# Patient Record
Sex: Female | Born: 1980 | Race: Black or African American | Hispanic: No | Smoking: Never smoker
Health system: Southern US, Community
[De-identification: ages and names within clinical notes are randomized; demographics above are authoritative.]

## PROBLEM LIST (undated history)

## (undated) ENCOUNTER — Inpatient Hospital Stay (HOSPITAL_COMMUNITY): Payer: Self-pay

## (undated) DIAGNOSIS — J40 Bronchitis, not specified as acute or chronic: Secondary | ICD-10-CM

## (undated) DIAGNOSIS — M1990 Unspecified osteoarthritis, unspecified site: Secondary | ICD-10-CM

## (undated) DIAGNOSIS — N83201 Unspecified ovarian cyst, right side: Secondary | ICD-10-CM

## (undated) DIAGNOSIS — E119 Type 2 diabetes mellitus without complications: Secondary | ICD-10-CM

## (undated) DIAGNOSIS — R87619 Unspecified abnormal cytological findings in specimens from cervix uteri: Secondary | ICD-10-CM

## (undated) DIAGNOSIS — R079 Chest pain, unspecified: Secondary | ICD-10-CM

## (undated) DIAGNOSIS — F319 Bipolar disorder, unspecified: Secondary | ICD-10-CM

## (undated) DIAGNOSIS — I10 Essential (primary) hypertension: Secondary | ICD-10-CM

## (undated) DIAGNOSIS — G5600 Carpal tunnel syndrome, unspecified upper limb: Secondary | ICD-10-CM

## (undated) DIAGNOSIS — R87629 Unspecified abnormal cytological findings in specimens from vagina: Secondary | ICD-10-CM

## (undated) DIAGNOSIS — J45909 Unspecified asthma, uncomplicated: Secondary | ICD-10-CM

## (undated) DIAGNOSIS — E7800 Pure hypercholesterolemia, unspecified: Secondary | ICD-10-CM

## (undated) DIAGNOSIS — F4310 Post-traumatic stress disorder, unspecified: Secondary | ICD-10-CM

## (undated) DIAGNOSIS — IMO0002 Reserved for concepts with insufficient information to code with codable children: Secondary | ICD-10-CM

## (undated) DIAGNOSIS — M779 Enthesopathy, unspecified: Secondary | ICD-10-CM

## (undated) DIAGNOSIS — M542 Cervicalgia: Secondary | ICD-10-CM

## (undated) DIAGNOSIS — E785 Hyperlipidemia, unspecified: Secondary | ICD-10-CM

## (undated) HISTORY — DX: Pure hypercholesterolemia, unspecified: E78.00

## (undated) HISTORY — DX: Cervicalgia: M54.2

## (undated) HISTORY — DX: Type 2 diabetes mellitus without complications: E11.9

## (undated) HISTORY — DX: Post-traumatic stress disorder, unspecified: F43.10

## (undated) HISTORY — DX: Unspecified abnormal cytological findings in specimens from vagina: R87.629

## (undated) HISTORY — PX: HERNIA REPAIR: SHX51

## (undated) HISTORY — DX: Enthesopathy, unspecified: M77.9

## (undated) HISTORY — DX: Bipolar disorder, unspecified: F31.9

## (undated) HISTORY — DX: Unspecified asthma, uncomplicated: J45.909

## (undated) HISTORY — DX: Hyperlipidemia, unspecified: E78.5

## (undated) HISTORY — DX: Chest pain, unspecified: R07.9

## (undated) HISTORY — DX: Unspecified osteoarthritis, unspecified site: M19.90

## (undated) HISTORY — DX: Reserved for concepts with insufficient information to code with codable children: IMO0002

## (undated) HISTORY — DX: Unspecified abnormal cytological findings in specimens from cervix uteri: R87.619

## (undated) HISTORY — DX: Unspecified ovarian cyst, right side: N83.201

## (undated) HISTORY — DX: Essential (primary) hypertension: I10

## (undated) HISTORY — DX: Carpal tunnel syndrome, unspecified upper limb: G56.00

---

## 2005-12-16 ENCOUNTER — Ambulatory Visit (HOSPITAL_COMMUNITY): Admission: RE | Admit: 2005-12-16 | Discharge: 2005-12-16 | Payer: Self-pay | Admitting: Obstetrics and Gynecology

## 2005-12-28 ENCOUNTER — Inpatient Hospital Stay (HOSPITAL_COMMUNITY): Admission: EM | Admit: 2005-12-28 | Discharge: 2005-12-30 | Payer: Self-pay | Admitting: Obstetrics and Gynecology

## 2007-09-15 ENCOUNTER — Other Ambulatory Visit: Admission: RE | Admit: 2007-09-15 | Discharge: 2007-09-15 | Payer: Self-pay | Admitting: Obstetrics & Gynecology

## 2009-04-23 ENCOUNTER — Emergency Department (HOSPITAL_COMMUNITY): Admission: EM | Admit: 2009-04-23 | Discharge: 2009-04-23 | Payer: Self-pay | Admitting: Emergency Medicine

## 2009-05-13 ENCOUNTER — Emergency Department (HOSPITAL_COMMUNITY): Admission: EM | Admit: 2009-05-13 | Discharge: 2009-05-13 | Payer: Self-pay | Admitting: Emergency Medicine

## 2009-06-19 ENCOUNTER — Emergency Department (HOSPITAL_COMMUNITY): Admission: EM | Admit: 2009-06-19 | Discharge: 2009-06-19 | Payer: Self-pay | Admitting: Emergency Medicine

## 2009-07-12 ENCOUNTER — Other Ambulatory Visit: Admission: RE | Admit: 2009-07-12 | Discharge: 2009-07-12 | Payer: Self-pay | Admitting: Obstetrics & Gynecology

## 2009-07-26 ENCOUNTER — Emergency Department (HOSPITAL_COMMUNITY): Admission: EM | Admit: 2009-07-26 | Discharge: 2009-07-26 | Payer: Self-pay | Admitting: Emergency Medicine

## 2009-10-31 ENCOUNTER — Emergency Department (HOSPITAL_COMMUNITY): Admission: EM | Admit: 2009-10-31 | Discharge: 2009-10-31 | Payer: Self-pay | Admitting: Emergency Medicine

## 2010-01-15 ENCOUNTER — Emergency Department (HOSPITAL_COMMUNITY): Admission: EM | Admit: 2010-01-15 | Discharge: 2010-01-15 | Payer: Self-pay | Admitting: Emergency Medicine

## 2010-04-17 ENCOUNTER — Emergency Department (HOSPITAL_COMMUNITY): Payer: Medicaid Other

## 2010-04-17 ENCOUNTER — Emergency Department (HOSPITAL_COMMUNITY)
Admission: EM | Admit: 2010-04-17 | Discharge: 2010-04-17 | Disposition: A | Discharge status: Home / Self Care | Payer: Medicaid Other | Attending: Emergency Medicine | Admitting: Emergency Medicine

## 2010-04-17 DIAGNOSIS — G5600 Carpal tunnel syndrome, unspecified upper limb: Secondary | ICD-10-CM | POA: Insufficient documentation

## 2010-04-17 DIAGNOSIS — M79609 Pain in unspecified limb: Secondary | ICD-10-CM | POA: Insufficient documentation

## 2010-05-02 LAB — URINALYSIS, ROUTINE W REFLEX MICROSCOPIC
Bilirubin Urine: NEGATIVE
Glucose, UA: NEGATIVE mg/dL
Ketones, ur: NEGATIVE mg/dL
Leukocytes, UA: NEGATIVE
Nitrite: NEGATIVE
Protein, ur: NEGATIVE mg/dL
Urobilinogen, UA: 0.2 mg/dL (ref 0.0–1.0)
pH: 6 (ref 5.0–8.0)

## 2010-05-02 LAB — POCT I-STAT, CHEM 8
BUN: 9 mg/dL (ref 6–23)
Calcium, Ion: 1.15 mmol/L (ref 1.12–1.32)
Creatinine, Ser: 0.7 mg/dL (ref 0.4–1.2)
HCT: 40 % (ref 36.0–46.0)

## 2010-05-02 LAB — URINE CULTURE: Culture: NO GROWTH

## 2010-05-02 LAB — WET PREP, GENITAL: Yeast Wet Prep HPF POC: NONE SEEN

## 2010-05-02 LAB — URINE MICROSCOPIC-ADD ON

## 2010-05-02 LAB — GC/CHLAMYDIA PROBE AMP, GENITAL
Chlamydia, DNA Probe: NEGATIVE
GC Probe Amp, Genital: NEGATIVE

## 2010-05-08 LAB — DIFFERENTIAL
Basophils Absolute: 0 10*3/uL (ref 0.0–0.1)
Basophils Relative: 1 % (ref 0–1)
Eosinophils Absolute: 0.2 10*3/uL (ref 0.0–0.7)
Eosinophils Relative: 2 % (ref 0–5)
Lymphs Abs: 3.7 10*3/uL (ref 0.7–4.0)
Monocytes Absolute: 0.6 10*3/uL (ref 0.1–1.0)
Neutro Abs: 4.3 10*3/uL (ref 1.7–7.7)
Neutrophils Relative %: 49 % (ref 43–77)

## 2010-05-08 LAB — CBC
MCHC: 33.8 g/dL (ref 30.0–36.0)
MCV: 94.5 fL (ref 78.0–100.0)
Platelets: 256 10*3/uL (ref 150–400)
RDW: 12.6 % (ref 11.5–15.5)
WBC: 8.8 10*3/uL (ref 4.0–10.5)

## 2010-05-08 LAB — BASIC METABOLIC PANEL
CO2: 28 mEq/L (ref 19–32)
Chloride: 108 mEq/L (ref 96–112)
GFR calc Af Amer: >60 mL/min (ref >60)
GFR calc non Af Amer: >60 mL/min (ref >60)
Glucose, Bld: 126 mg/dL — ABNORMAL HIGH (ref 70–99)

## 2010-05-14 LAB — POCT I-STAT, CHEM 8
HCT: 37 % (ref 36.0–46.0)
Hemoglobin: 12.6 g/dL (ref 12.0–15.0)
TCO2: 28 mmol/L (ref 0–100)

## 2010-05-18 ENCOUNTER — Other Ambulatory Visit (HOSPITAL_COMMUNITY)
Admission: RE | Admit: 2010-05-18 | Discharge: 2010-05-18 | Disposition: A | Discharge status: Home / Self Care | Payer: Medicaid Other | Source: Ambulatory Visit | Attending: Obstetrics & Gynecology | Admitting: Obstetrics & Gynecology

## 2010-05-18 ENCOUNTER — Other Ambulatory Visit: Payer: Self-pay | Admitting: Obstetrics & Gynecology

## 2010-05-18 DIAGNOSIS — Z01419 Encounter for gynecological examination (general) (routine) without abnormal findings: Secondary | ICD-10-CM | POA: Insufficient documentation
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
## 2010-07-07 NOTE — Op Note (Signed)
NAME:  Breanna Malone, Breanna Malone NO.:  [id number]   MEDICAL RECORD NO.:  [id number]          PATIENT TYPE:  INP   LOCATION:  A402                          FACILITY:  [facility]   PHYSICIAN:  Lazaro Arms, M.D.   DATE OF BIRTH:  [birth year]   DATE OF PROCEDURE:  12/29/2005  DATE OF DISCHARGE:  12/30/2005                               OPERATIVE REPORT   Breanna Malone is a 30 year old gravida 6, para 4, abortus 1, who came in in  active labor.  She progressed well through labor.  She had an epidural  placed.  Over an intact perineum, she delivered a viable female infant on  December 29, 2005, at 12:24, with Apgars of 9 and 9 weighing 7 pounds 13  ounces.  There was a three vessel cord.  Cord blood and cord gas were  sent.  There were no vaginal lacerations or episiotomy.  The placenta  was delivered intact.  The uterus was firm below the umbilicus.  Estimated blood loss for the delivery was 650 mL.  The infant underwent  routine neonatal resuscitation.      Lazaro Arms, M.D.  Electronically Signed     LHE/MEDQ  D:  01/23/2006  T:  01/23/2006  Job:  [id number]

## 2010-07-07 NOTE — Op Note (Signed)
NAME:  Breanna Malone, Breanna Malone NO.:  [id number]   MEDICAL RECORD NO.:  [id number]          PATIENT TYPE:  INP   LOCATION:  A402                          FACILITY:  [facility]   PHYSICIAN:  Lazaro Arms, M.D.   DATE OF BIRTH:  1981/01/09   DATE OF PROCEDURE:  12/28/2005  DATE OF DISCHARGE:  12/30/2005                               OPERATIVE REPORT   [name] is a 29 year old gravida 6, para 4, abortus 1, in active labor  requesting epidural.  She was placed in a sitting position.  Betadine  prep was used.  1% lidocaine is injected into the L3-L4 interspace area.  A 17 gauge Tuohey needle was used.  Loss of resistance is employed and  the epidural space is found on one pass without difficulty.  10 mL of  0.125% bupivacaine was given without ill effects.  The epidural catheter  is fed.  An additional 10 mL was given.  This was taped down to the  epidural space 5 cm.  Continuous infusion of 0125% bupivacaine with 2  mcg per mL of fentanyl was begun at 12 mL an hour.  The patient  tolerated the procedure well without any problems.  Fetal heart rate  tracing is stable.  Blood pressure is stable.      Lazaro Arms, M.D.  Electronically Signed     LHE/MEDQ  D:  01/23/2006  T:  01/23/2006  Job:  [id number]

## 2010-07-07 NOTE — Consult Note (Signed)
NAME:  Breanna Malone, Breanna Malone NO.:  [id number]   MEDICAL RECORD NO.:  [id number]          PATIENT TYPE:  OIB   LOCATION:  LDR2                          FACILITY:  [facility]   PHYSICIAN:  Tilda Burrow, M.D. DATE OF BIRTH:  1980/04/29   DATE OF CONSULTATION:  12/16/2005  DATE OF DISCHARGE:                                   CONSULTATION   OBSERVATION FOR THREE HOURS   HISTORY OF PRESENT ILLNESS:  This 30 year old female called at approximately  4:00 A.M. complaining of increased vaginal discharge and questionable  membrane rupture.  She presented to [facility] Labor [facility] Delivery  where her cervical exam was found to be 3 to 4 cm on this gravida 6, para 4,  AB 1.  She was observed to rule out labor and rule out membrane rupture.   COURSE:  Nitrazine was negative.  By the following morning speculum exam and  FERN test were both negative.  The patient had no contractions.  Cervical  exam showed the presenting part to be quite high, minus 3 station with an  extremely floppy external os with the internal os no more than 2 to 3 cm.  She has no evidence of membrane rupture or labor.  Consensus due date by  ultrasound January 04, 2006.   DISPOSITION:  Patient is discharged home for routine followup in our office.      Tilda Burrow, M.D.  Electronically Signed     JVF/MEDQ  D:  12/16/2005  T:  12/17/2005  Job:  [id number]

## 2010-07-21 ENCOUNTER — Emergency Department (HOSPITAL_COMMUNITY): Payer: Medicaid Other

## 2010-07-21 ENCOUNTER — Emergency Department (HOSPITAL_COMMUNITY)
Admission: EM | Admit: 2010-07-21 | Discharge: 2010-07-21 | Disposition: A | Discharge status: Home / Self Care | Payer: Medicaid Other | Attending: Emergency Medicine | Admitting: Emergency Medicine

## 2010-07-21 DIAGNOSIS — Y92009 Unspecified place in unspecified non-institutional (private) residence as the place of occurrence of the external cause: Secondary | ICD-10-CM | POA: Insufficient documentation

## 2010-07-21 DIAGNOSIS — J45909 Unspecified asthma, uncomplicated: Secondary | ICD-10-CM | POA: Insufficient documentation

## 2010-07-21 DIAGNOSIS — IMO0002 Reserved for concepts with insufficient information to code with codable children: Secondary | ICD-10-CM | POA: Insufficient documentation

## 2010-07-21 DIAGNOSIS — W19XXXA Unspecified fall, initial encounter: Secondary | ICD-10-CM | POA: Insufficient documentation

## 2010-07-21 DIAGNOSIS — F341 Dysthymic disorder: Secondary | ICD-10-CM | POA: Insufficient documentation

## 2010-07-21 DIAGNOSIS — S93609A Unspecified sprain of unspecified foot, initial encounter: Secondary | ICD-10-CM | POA: Insufficient documentation

## 2010-08-15 ENCOUNTER — Other Ambulatory Visit: Payer: Self-pay | Admitting: Obstetrics and Gynecology

## 2010-09-17 ENCOUNTER — Encounter: Payer: Self-pay | Admitting: Emergency Medicine

## 2010-09-17 ENCOUNTER — Emergency Department (HOSPITAL_COMMUNITY)
Admission: EM | Admit: 2010-09-17 | Discharge: 2010-09-18 | Discharge status: Against Medical Advice | Payer: Medicaid Other | Attending: Emergency Medicine | Admitting: Emergency Medicine

## 2010-09-17 DIAGNOSIS — J3489 Other specified disorders of nose and nasal sinuses: Secondary | ICD-10-CM | POA: Insufficient documentation

## 2010-09-17 HISTORY — DX: Bronchitis, not specified as acute or chronic: J40

## 2010-09-17 NOTE — ED Notes (Signed)
C/o feeling sore in her throat, nose and back when she breathes.  A&O; skin w/d. Respirations even and unlabored; able to speak in complete sentences without difficulty.

## 2010-10-03 ENCOUNTER — Other Ambulatory Visit: Payer: Self-pay | Admitting: Obstetrics & Gynecology

## 2010-10-19 ENCOUNTER — Encounter (HOSPITAL_COMMUNITY)
Admission: RE | Admit: 2010-10-19 | Discharge: 2010-10-19 | Payer: Medicaid Other | Source: Ambulatory Visit | Attending: Obstetrics & Gynecology | Admitting: Obstetrics & Gynecology

## 2010-10-19 NOTE — Patient Instructions (Addendum)
20 Breanna Malone  10/19/2010   Your procedure is scheduled on:  10/25/2010  Report to [facility] at  615  AM.  Call this number if you have problems the morning of surgery: [phone]   Remember:   Do not eat food:After Midnight.  Do not drink clear liquids: After Midnight.  Take these medicines the morning of surgery with A SIP OF WATER: none   Do not wear jewelry, make-up or nail polish.  Do not wear lotions, powders, or perfumes. You may wear deodorant.  Do not shave 48 hours prior to surgery.  Do not bring valuables to the hospital.  Contacts, dentures or bridgework may not be worn into surgery.  Leave suitcase in the car. After surgery it may be brought to your room.  For patients admitted to the hospital, checkout time is 11:00 AM the day of discharge.   Patients discharged the day of surgery will not be allowed to drive home.  Name and phone number of your driver: family  Special Instructions: CHG Shower Use Special Wash: 1/2 bottle night before surgery and 1/2 bottle morning of surgery.   Please read over the following fact sheets that you were given: Pain Booklet, MRSA Information, Surgical Site Infection Prevention, Anesthesia Post-op Instructions and Care and Recovery After Surgery PATIENT INSTRUCTIONS POST-ANESTHESIA  IMMEDIATELY FOLLOWING SURGERY:  Do not drive or operate machinery for the first twenty four hours after surgery.  Do not make any important decisions for twenty four hours after surgery or while taking narcotic pain medications or sedatives.  If you develop intractable nausea and vomiting or a severe headache please notify your doctor immediately.  FOLLOW-UP:  Please make an appointment with your surgeon as instructed. You do not need to follow up with anesthesia unless specifically instructed to do so.  WOUND CARE INSTRUCTIONS (if applicable):  Keep a dry clean dressing on the anesthesia/puncture wound site if there is drainage.  Once the wound has quit draining  you may leave it open to air.  Generally you should leave the bandage intact for twenty four hours unless there is drainage.  If the epidural site drains for more than 36-48 hours please call the anesthesia department.  QUESTIONS?:  Please feel free to call your physician or the hospital operator if you have any questions, and they will be happy to assist you.     [facility] Anesthesia Department [address] [place] [phone]

## 2010-10-25 ENCOUNTER — Ambulatory Visit (HOSPITAL_COMMUNITY)
Admission: RE | Admit: 2010-10-25 | Payer: Medicaid Other | Source: Ambulatory Visit | Admitting: Obstetrics & Gynecology

## 2010-10-25 ENCOUNTER — Encounter (HOSPITAL_COMMUNITY): Admission: RE | Payer: Self-pay | Source: Ambulatory Visit

## 2010-10-25 SURGERY — LIGATION, FALLOPIAN TUBE, LAPAROSCOPIC
Anesthesia: General | Laterality: Bilateral

## 2010-11-23 ENCOUNTER — Encounter (HOSPITAL_COMMUNITY): Payer: Self-pay | Admitting: Emergency Medicine

## 2010-11-23 ENCOUNTER — Emergency Department (HOSPITAL_COMMUNITY)
Admission: EM | Admit: 2010-11-23 | Discharge: 2010-11-23 | Disposition: A | Discharge status: Home / Self Care | Payer: Medicaid Other | Attending: Emergency Medicine | Admitting: Emergency Medicine

## 2010-11-23 DIAGNOSIS — J069 Acute upper respiratory infection, unspecified: Secondary | ICD-10-CM

## 2010-11-23 DIAGNOSIS — M542 Cervicalgia: Secondary | ICD-10-CM | POA: Insufficient documentation

## 2010-11-23 DIAGNOSIS — J3489 Other specified disorders of nose and nasal sinuses: Secondary | ICD-10-CM | POA: Insufficient documentation

## 2010-11-23 DIAGNOSIS — R079 Chest pain, unspecified: Secondary | ICD-10-CM | POA: Insufficient documentation

## 2010-11-23 DIAGNOSIS — R51 Headache: Secondary | ICD-10-CM | POA: Insufficient documentation

## 2010-11-23 DIAGNOSIS — R070 Pain in throat: Secondary | ICD-10-CM | POA: Insufficient documentation

## 2010-11-23 MED ORDER — BENZONATATE 200 MG PO CAPS
200.0000 mg | ORAL_CAPSULE | Freq: Three times a day (TID) | ORAL | Status: AC | PRN
Start: 2010-11-23 — End: 2010-11-30

## 2010-11-23 MED ORDER — IBUPROFEN 800 MG PO TABS
800.0000 mg | ORAL_TABLET | Freq: Once | ORAL | Status: AC
  Administered 2010-11-23: 800 mg via ORAL
  Filled 2010-11-23: qty 1

## 2010-11-23 MED ORDER — BENZONATATE 100 MG PO CAPS
100.0000 mg | ORAL_CAPSULE | ORAL | Status: AC
  Administered 2010-11-23: 100 mg via ORAL
  Filled 2010-11-23: qty 1

## 2010-11-23 NOTE — ED Notes (Signed)
Lung sounds clear 

## 2010-11-23 NOTE — ED Provider Notes (Signed)
History     CSN: [id number] Arrival date & time: 11/23/2010 12:21 PM  Chief Complaint  Patient presents with  . Headache  . Sore Throat  . Chest Pain    (Consider location/radiation/quality/duration/timing/severity/associated sxs/prior treatment) HPI Comments: Symptoms started last night.  Started getting sore throat, pressure in chest with deep breathing, and today, started getting diffuse headache. Also feeling congested with dry cough. Denies fevers or chills.  Has PCP but his office is closed this week.  Denies any sick contacts. Not employed. Stays at home most of the time. Lives alone. Never smoked, does not drink or use illicit drugs.  No history of high blood pressure, diabetes, high cholesterol.  Has not had flu vaccine.   Patient is a 30 y.o. female presenting with headaches, pharyngitis, and chest pain. The history is provided by the patient.  Headache  This is a new problem. The current episode started 6 to 12 hours ago. The problem occurs constantly. The problem has not changed since onset.The headache is associated with nothing. Pain location: Diffuse  The quality of the pain is described as dull. The pain is moderate. The pain does not radiate. Pertinent negatives include no anorexia, no fever, no chest pressure, no near-syncope, no palpitations, no syncope, no nausea and no vomiting. She has tried nothing for the symptoms.  Sore Throat This is a new problem. The current episode started yesterday. The problem occurs constantly. The problem has been unchanged. Associated symptoms include chest pain, congestion, coughing (Dry), headaches, neck pain and a sore throat. Pertinent negatives include no anorexia, arthralgias, chills, diaphoresis, fever, myalgias, nausea, rash, swollen glands, urinary symptoms, vomiting or weakness. Associated symptoms comments: Diffuse headache. No photophobia. No vision or hearing changes. . The symptoms are aggravated by coughing and swallowing. She  has tried nothing for the symptoms.  Chest Pain The chest pain began 6 - 12 hours ago. Chest pain occurs intermittently (With deep breathing/coughing). The chest pain is unchanged. The pain is associated with breathing and coughing. The severity of the pain is moderate. The quality of the pain is described as dull. Radiates to: Across upper chest bilaterally. Primary symptoms include cough (Dry). Pertinent negatives for primary symptoms include no fever, no syncope, no wheezing, no palpitations, no nausea, no vomiting and no dizziness.  Pertinent negatives for associated symptoms include no diaphoresis, no near-syncope and no weakness. She tried nothing for the symptoms. Risk factors include obesity and sedentary lifestyle.  Pertinent negatives for past medical history include no CAD, no COPD, no diabetes and no thyroid problem.  Pertinent negatives for family medical history include: no diabetes in family, no heart disease in family, no hyperlipidemia in family and no hypertension in family. Family history comments: Only known disease is emphysema in father who was heavy smoker     Past Medical History  Diagnosis Date  . Asthma   . Bronchitis     History reviewed. No pertinent past surgical history.  No family history on file.  History  Substance Use Topics  . Smoking status: Never Smoker   . Smokeless tobacco: Not on file  . Alcohol Use: No    OB History    Grav Para Term Preterm Abortions TAB SAB Ect Mult Living                  Review of Systems  Constitutional: Negative for fever, chills and diaphoresis.  HENT: Positive for congestion, sore throat and neck pain. Negative for hearing loss, ear pain and  neck stiffness.   Eyes: Negative for photophobia, discharge and visual disturbance.  Respiratory: Positive for cough (Dry). Negative for choking, chest tightness, wheezing and stridor.   Cardiovascular: Positive for chest pain. Negative for palpitations, syncope and  near-syncope.  Gastrointestinal: Negative for nausea, vomiting and anorexia.  Genitourinary: Negative for dysuria and difficulty urinating.  Musculoskeletal: Negative for myalgias, back pain and arthralgias.  Skin: Negative for rash.  Neurological: Positive for headaches. Negative for dizziness and weakness.  Hematological: Negative for adenopathy.    Allergies  Darvocet; Lortab; Penicillins; and Percocet  Home Medications  No current outpatient prescriptions on file.  BP 103/77  Pulse 75  Temp(Src) 98.7 F (37.1 C) (Oral)  Resp 16  Ht 5\' 4"  (1.626 m)  Wt 228 lb (103.42 kg)  BMI 39.14 kg/m2  SpO2 100%  LMP 11/09/2010  Physical Exam  Constitutional: She is oriented to person, place, and time. No distress.       Obese  HENT:  Head: Normocephalic and atraumatic.  Nose: No rhinorrhea.  Mouth/Throat: Mucous membranes are normal. No oral lesions. Normal dentition. Posterior oropharyngeal erythema (Mild) present. No oropharyngeal exudate or tonsillar abscesses.  Eyes: Conjunctivae are normal.  Cardiovascular: Normal rate, regular rhythm and intact distal pulses.  Exam reveals no gallop.   No murmur heard. Pulmonary/Chest: Breath sounds normal. No respiratory distress. She has no wheezes. She has no rales. She exhibits tenderness (TTP across upper chest ).  Abdominal: Soft. Bowel sounds are normal. She exhibits no distension. There is no tenderness. There is no guarding.       Obese  Musculoskeletal: Normal range of motion. She exhibits no edema and no tenderness.  Lymphadenopathy:       Head (right side): Tonsillar (Mild) adenopathy present. No submental, no submandibular, no preauricular, no posterior auricular and no occipital adenopathy present.       Head (left side): Tonsillar (Mild) adenopathy present. No submental, no submandibular, no preauricular, no posterior auricular and no occipital adenopathy present.    She has no cervical adenopathy.       Right: No  supraclavicular adenopathy present.       Left: No supraclavicular adenopathy present.       TTP along cervical LN  Neurological: She is alert and oriented to person, place, and time. Coordination normal.  Skin: Skin is warm and dry. No rash noted. She is not diaphoretic. No erythema.  Psychiatric: She has a normal mood and affect. Her behavior is normal. Judgment and thought content normal.    ED Course  Procedures (including critical care time)  Labs Reviewed - No data to display No results found.   No diagnosis found.   MDM  Seems to have viral infection.  Will treat symptomatically.  Given red flags to return to PCP or ED.        Lucianne [place] Resident 11/23/10 1345

## 2010-11-23 NOTE — ED Notes (Signed)
Pt c/o pain to small area of upper L chest area that feels like pressure. Headache/sore throat started this am. Nad. C/o cough since this am nonproductive. Denies n/v/d or sob. Nondiaphoretic.

## 2010-11-23 NOTE — ED Provider Notes (Signed)
Medical screening examination/treatment/procedure(s) were conducted as a shared visit with resident and myself.  I personally evaluated the patient during the encounter   Pt well appearing, vitals appropriate stable for d/c  Joya Gaskins, MD 11/23/10 2226

## 2011-01-19 ENCOUNTER — Emergency Department (HOSPITAL_COMMUNITY)
Admission: EM | Admit: 2011-01-19 | Discharge: 2011-01-19 | Disposition: A | Discharge status: Home / Self Care | Payer: Medicaid Other | Attending: Emergency Medicine | Admitting: Emergency Medicine

## 2011-01-19 ENCOUNTER — Encounter (HOSPITAL_COMMUNITY): Payer: Self-pay | Admitting: *Deleted

## 2011-01-19 DIAGNOSIS — R109 Unspecified abdominal pain: Secondary | ICD-10-CM | POA: Insufficient documentation

## 2011-01-19 DIAGNOSIS — E7800 Pure hypercholesterolemia, unspecified: Secondary | ICD-10-CM | POA: Insufficient documentation

## 2011-01-19 DIAGNOSIS — J45909 Unspecified asthma, uncomplicated: Secondary | ICD-10-CM | POA: Insufficient documentation

## 2011-01-19 HISTORY — DX: Pure hypercholesterolemia, unspecified: E78.00

## 2011-01-19 LAB — DIFFERENTIAL
Basophils Absolute: 0 10*3/uL (ref 0.0–0.1)
Lymphocytes Relative: 46 % (ref 12–46)
Monocytes Absolute: 0.5 10*3/uL (ref 0.1–1.0)
Monocytes Relative: 5 % (ref 3–12)
Neutro Abs: 4.4 10*3/uL (ref 1.7–7.7)

## 2011-01-19 LAB — COMPREHENSIVE METABOLIC PANEL
AST: 36 U/L (ref 0–37)
Alkaline Phosphatase: 149 U/L — ABNORMAL HIGH (ref 39–117)
CO2: 28 mEq/L (ref 19–32)
Chloride: 102 mEq/L (ref 96–112)
Creatinine, Ser: 0.72 mg/dL (ref 0.50–1.10)
GFR calc non Af Amer: >90 mL/min (ref >90)
Potassium: 3.5 mEq/L (ref 3.5–5.1)
Total Bilirubin: 0.5 mg/dL (ref 0.3–1.2)

## 2011-01-19 LAB — URINE MICROSCOPIC-ADD ON

## 2011-01-19 LAB — CBC
HCT: 38.7 % (ref 36.0–46.0)
Hemoglobin: 12.9 g/dL (ref 12.0–15.0)
RDW: 11.7 % (ref 11.5–15.5)
WBC: 9.1 10*3/uL (ref 4.0–10.5)

## 2011-01-19 LAB — URINALYSIS, ROUTINE W REFLEX MICROSCOPIC
Bilirubin Urine: NEGATIVE
Protein, ur: 30 mg/dL — AB
Urobilinogen, UA: 0.2 mg/dL (ref 0.0–1.0)

## 2011-01-19 MED ORDER — HYOSCYAMINE SULFATE CR 0.375 MG PO CP12: 0.3750 mg | ORAL_CAPSULE | Freq: Two times a day (BID) | ORAL | Status: AC | PRN

## 2011-01-19 MED ORDER — RANITIDINE HCL 150 MG PO CAPS: 150.0000 mg | ORAL_CAPSULE | Freq: Two times a day (BID) | ORAL | Status: DC

## 2011-01-19 MED ORDER — ONDANSETRON HCL 4 MG/2ML IJ SOLN
4.0000 mg | Freq: Once | INTRAMUSCULAR | Status: AC
  Administered 2011-01-19: 4 mg via INTRAVENOUS
  Filled 2011-01-19: qty 2

## 2011-01-19 MED ORDER — HYDROMORPHONE HCL PF 1 MG/ML IJ SOLN
0.5000 mg | Freq: Once | INTRAMUSCULAR | Status: AC
  Administered 2011-01-19: 0.5 mg via INTRAVENOUS
  Filled 2011-01-19: qty 1

## 2011-01-19 MED ORDER — SODIUM CHLORIDE 0.9 % IV SOLN: INTRAVENOUS | Status: DC

## 2011-01-19 MED ORDER — PROMETHAZINE HCL 25 MG PO TABS: 25.0000 mg | ORAL_TABLET | Freq: Four times a day (QID) | ORAL | Status: DC | PRN

## 2011-01-19 MED ORDER — PANTOPRAZOLE SODIUM 40 MG IV SOLR
40.0000 mg | Freq: Once | INTRAVENOUS | Status: AC
  Administered 2011-01-19: 40 mg via INTRAVENOUS
  Filled 2011-01-19: qty 40

## 2011-01-19 NOTE — ED Notes (Signed)
Waiting to be reval and disposition

## 2011-01-19 NOTE — ED Notes (Signed)
C/o upper abd pain, burning, after eating; reports "aching and hurting" at other times; c/o nausea; denies vomiting-states last vomited 5 days ago.

## 2011-01-19 NOTE — ED Provider Notes (Signed)
History     CSN: [id number] Arrival date & time: 01/19/2011 12:17 PM   First MD Initiated Contact with Patient 01/19/11 1312      Chief Complaint  Patient presents with  . Abdominal Pain    (Consider location/radiation/quality/duration/timing/severity/associated sxs/prior treatment) Patient is a 30 y.o. female presenting with abdominal pain. The history is provided by the patient (The patient states that she has a burning abdominal pain. The pain is not affected by eating).  Abdominal Pain The primary symptoms of the illness include abdominal pain. The primary symptoms of the illness do not include fatigue or diarrhea. The current episode started 2 days ago. The onset of the illness was gradual. The problem has not changed since onset. The abdominal pain began 2 days ago. The pain came on gradually. The abdominal pain has been unchanged since its onset. The abdominal pain does not radiate. The severity of the abdominal pain is 5/10.  The illness is associated with a recent illness. Symptoms associated with the illness do not include hematuria, frequency or back pain.    Past Medical History  Diagnosis Date  . Asthma   . Bronchitis   . High cholesterol     History reviewed. No pertinent past surgical history.  No family history on file.  History  Substance Use Topics  . Smoking status: Never Smoker   . Smokeless tobacco: Not on file  . Alcohol Use: No    OB History    Grav Para Term Preterm Abortions TAB SAB Ect Mult Living                  Review of Systems  Constitutional: Negative for fatigue.  HENT: Negative for congestion, sinus pressure and ear discharge.   Eyes: Negative for discharge.  Respiratory: Negative for cough.   Cardiovascular: Negative for chest pain.  Gastrointestinal: Positive for abdominal pain. Negative for diarrhea.  Genitourinary: Negative for frequency and hematuria.  Musculoskeletal: Negative for back pain.  Skin: Negative for rash.    Neurological: Negative for seizures and headaches.  Hematological: Negative.   Psychiatric/Behavioral: Negative for hallucinations.    Allergies  Darvocet; Lortab; Penicillins; and Percocet  Home Medications   Current Outpatient Rx  Name Route Sig Dispense Refill  . BENZONATATE 100 MG PO CAPS Oral Take 100 mg by mouth 3 (three) times daily as needed. For cough     . CITALOPRAM HYDROBROMIDE 20 MG PO TABS Oral Take 20 mg by mouth daily.      Marland Kitchen LORAZEPAM 0.5 MG PO TABS Oral Take 0.5 mg by mouth every 8 (eight) hours. For anxiety     . NABUMETONE 750 MG PO TABS Oral Take 750 mg by mouth 2 (two) times daily.      Marland Kitchen PRAVASTATIN SODIUM 40 MG PO TABS Oral Take 40 mg by mouth daily.      Marland Kitchen HYOSCYAMINE SULFATE ER 0.375 MG PO CP12 Oral Take 1 capsule (0.375 mg total) by mouth 2 (two) times daily as needed for cramping. 60 capsule 0  . IBUPROFEN 200 MG PO TABS Oral Take 200 mg by mouth every 6 (six) hours as needed. For pain     . PROMETHAZINE HCL 25 MG PO TABS Oral Take 1 tablet (25 mg total) by mouth every 6 (six) hours as needed for nausea. 15 tablet 0  . RANITIDINE HCL 150 MG PO CAPS Oral Take 1 capsule (150 mg total) by mouth 2 (two) times daily. 60 capsule 0    BP 120/64  Pulse 75  Temp(Src) 98.5 F (36.9 C) (Oral)  Resp 16  Ht 5\' 4"  (1.626 m)  Wt 224 lb (101.606 kg)  BMI 38.45 kg/m2  SpO2 96%  LMP 11/08/2010  Physical Exam  Constitutional: She is oriented to person, place, and time. She appears well-developed.  HENT:  Head: Normocephalic and atraumatic.  Eyes: Conjunctivae and EOM are normal. No scleral icterus.  Neck: Neck supple. No thyromegaly present.  Cardiovascular: Normal rate and regular rhythm.  Exam reveals no gallop and no friction rub.   No murmur heard. Pulmonary/Chest: No stridor. She has no wheezes. She has no rales. She exhibits no tenderness.  Abdominal: She exhibits no distension. There is tenderness. There is no rebound.       tendernous in epigastric abd   Musculoskeletal: Normal range of motion. She exhibits no edema.  Lymphadenopathy:    She has no cervical adenopathy.  Neurological: She is oriented to person, place, and time. Coordination normal.  Skin: No rash noted. No erythema.  Psychiatric: She has a normal mood and affect. Her behavior is normal.    ED Course  Procedures (including critical care time)  Labs Reviewed  URINALYSIS, ROUTINE W REFLEX MICROSCOPIC - Abnormal; Notable for the following:    Hgb urine dipstick TRACE (*)    Protein, ur 30 (*)    All other components within normal limits  URINE MICROSCOPIC-ADD ON - Abnormal; Notable for the following:    Squamous Epithelial / LPF MANY (*)    Bacteria, UA MANY (*)    All other components within normal limits  DIFFERENTIAL - Abnormal; Notable for the following:    Lymphs Abs 4.2 (*)    All other components within normal limits  COMPREHENSIVE METABOLIC PANEL - Abnormal; Notable for the following:    BUN 5 (*)    ALT 53 (*)    Alkaline Phosphatase 149 (*)    All other components within normal limits  POCT PREGNANCY, URINE  CBC  LIPASE, BLOOD  POCT PREGNANCY, URINE  URINE CULTURE   No results found.   1. Abdominal pain       MDM  abd pain.  Pud, gastritis,  Gall bladder dz        Benny Lennert, MD 01/19/11 [phone]

## 2011-01-19 NOTE — ED Notes (Signed)
Pt complain of nausea. edp aware

## 2011-01-19 NOTE — ED Notes (Signed)
Patient states that the medicine that was given to her has made her drowsy and her face itchy. Patient keeps dozing in and out. Patient does not need anything at this time.

## 2011-01-23 LAB — URINE CULTURE

## 2011-06-25 ENCOUNTER — Other Ambulatory Visit (HOSPITAL_COMMUNITY)
Admission: RE | Admit: 2011-06-25 | Discharge: 2011-06-25 | Disposition: A | Discharge status: Home / Self Care | Payer: Medicaid Other | Source: Ambulatory Visit | Attending: Obstetrics and Gynecology | Admitting: Obstetrics and Gynecology

## 2011-06-25 ENCOUNTER — Other Ambulatory Visit: Payer: Self-pay | Admitting: Adult Health

## 2011-06-25 DIAGNOSIS — Z01419 Encounter for gynecological examination (general) (routine) without abnormal findings: Secondary | ICD-10-CM | POA: Insufficient documentation

## 2011-06-25 DIAGNOSIS — R87810 Cervical high risk human papillomavirus (HPV) DNA test positive: Secondary | ICD-10-CM | POA: Insufficient documentation

## 2011-06-25 DIAGNOSIS — Z113 Encounter for screening for infections with a predominantly sexual mode of transmission: Secondary | ICD-10-CM | POA: Insufficient documentation

## 2011-08-29 ENCOUNTER — Inpatient Hospital Stay (HOSPITAL_COMMUNITY): Admission: RE | Admit: 2011-08-29 | Payer: Medicaid Other | Source: Ambulatory Visit

## 2011-09-05 ENCOUNTER — Ambulatory Visit: Admit: 2011-09-05 | Payer: Medicaid Other | Admitting: Obstetrics & Gynecology

## 2011-09-05 SURGERY — LIGATION, FALLOPIAN TUBE, LAPAROSCOPIC
Anesthesia: General | Laterality: Bilateral

## 2011-10-17 ENCOUNTER — Emergency Department (HOSPITAL_COMMUNITY)
Admission: EM | Admit: 2011-10-17 | Discharge: 2011-10-17 | Disposition: A | Discharge status: Home / Self Care | Payer: Medicaid Other | Attending: Emergency Medicine | Admitting: Emergency Medicine

## 2011-10-17 ENCOUNTER — Encounter (HOSPITAL_COMMUNITY): Payer: Self-pay

## 2011-10-17 DIAGNOSIS — J45909 Unspecified asthma, uncomplicated: Secondary | ICD-10-CM | POA: Insufficient documentation

## 2011-10-17 DIAGNOSIS — E7800 Pure hypercholesterolemia, unspecified: Secondary | ICD-10-CM | POA: Insufficient documentation

## 2011-10-17 DIAGNOSIS — L739 Follicular disorder, unspecified: Secondary | ICD-10-CM

## 2011-10-17 DIAGNOSIS — L738 Other specified follicular disorders: Secondary | ICD-10-CM | POA: Insufficient documentation

## 2011-10-17 DIAGNOSIS — Z880 Allergy status to penicillin: Secondary | ICD-10-CM | POA: Insufficient documentation

## 2011-10-17 MED ORDER — DIPHENHYDRAMINE HCL 50 MG/ML IJ SOLN
50.0000 mg | Freq: Once | INTRAMUSCULAR | Status: AC
  Administered 2011-10-17: 50 mg via INTRAMUSCULAR

## 2011-10-17 MED ORDER — DIPHENHYDRAMINE HCL 25 MG PO CAPS: 25.0000 mg | ORAL_CAPSULE | Freq: Four times a day (QID) | ORAL | Status: DC | PRN

## 2011-10-17 MED ORDER — CLINDAMYCIN PHOSPHATE 1 % EX SOLN: Freq: Two times a day (BID) | CUTANEOUS | Status: DC

## 2011-10-17 MED ORDER — DIPHENHYDRAMINE HCL 50 MG/ML IJ SOLN
INTRAMUSCULAR | Status: AC
  Filled 2011-10-17: qty 1

## 2011-10-17 NOTE — ED Notes (Signed)
Itching rash to both arms. Present for 3 days. Upper back itches also.

## 2011-10-17 NOTE — ED Notes (Signed)
For the past couple of days I have been having a rash and itching per pt.

## 2011-10-18 NOTE — ED Provider Notes (Signed)
Medical screening examination/treatment/procedure(s) were performed by non-physician practitioner and as supervising physician I was immediately available for consultation/collaboration.  Wrenna Saks, MD 10/18/11 2257 

## 2011-10-18 NOTE — ED Provider Notes (Signed)
History     CSN: [id number]  Arrival date & time 10/17/11  1959   First MD Initiated Contact with Patient 10/17/11 2038      Chief Complaint  Patient presents with  . Rash  . Insect Bite    (Consider location/radiation/quality/duration/timing/severity/associated sxs/prior treatment) HPI Comments: Breanna Malone presents with rash on her upper arms which has been present for the past several days.  She believes she is being bit by insects in her apartment, although has not identified the exact source.  She states neighbors recently moved out and their apartment was fumigated and since she has been feeling bites on her arms.  The rash is itchy with a few tender lesions as well.  There has been no drainage and she denies fevers or chills or swelling.  She has taken no medications for her symptoms.  She is currently on bactrim for a uti.  She denies any other possible sources of this rash, including no new environmental or skin product exposures.     Patient is a 31 y.o. female presenting with rash. The history is provided by the patient.  Rash     Past Medical History  Diagnosis Date  . Asthma   . Bronchitis   . High cholesterol     History reviewed. No pertinent past surgical history.  History reviewed. No pertinent family history.  History  Substance Use Topics  . Smoking status: Never Smoker   . Smokeless tobacco: Not on file  . Alcohol Use: No    OB History    Grav Para Term Preterm Abortions TAB SAB Ect Mult Living                  Review of Systems  Constitutional: Negative for fever and chills.  HENT: Negative for facial swelling.   Respiratory: Negative for shortness of breath and wheezing.   Skin: Positive for rash.  Neurological: Negative for numbness.    Allergies  Darvocet; Hydrocodone-acetaminophen; Penicillins; and Percocet  Home Medications   Current Outpatient Rx  Name Route Sig Dispense Refill  . GABAPENTIN 300 MG PO CAPS Oral Take 300 mg  by mouth at bedtime.    Marland Kitchen MEDROXYPROGESTERONE ACETATE 150 MG/ML IM SUSP Intramuscular Inject 150 mg into the muscle every 3 (three) months.    Marland Kitchen PRAVASTATIN SODIUM 40 MG PO TABS Oral Take 40 mg by mouth daily.      . QUETIAPINE FUMARATE 100 MG PO TABS Oral Take 100 mg by mouth at bedtime.    . SULFAMETHOXAZOLE-TMP DS 800-160 MG PO TABS Oral Take 1 tablet by mouth 2 (two) times daily. FOR 10 DAYS    . CLINDAMYCIN PHOSPHATE 1 % EX SOLN Topical Apply topically 2 (two) times daily. Apply to lesions on your upper arms twice daily for 10 days 30 mL 0  . DIPHENHYDRAMINE HCL 25 MG PO CAPS Oral Take 1 capsule (25 mg total) by mouth every 6 (six) hours as needed for itching. 30 capsule 0  . IBUPROFEN 200 MG PO TABS Oral Take 200 mg by mouth every 6 (six) hours as needed. For pain       There were no vitals taken for this visit.  Physical Exam  Constitutional: She appears well-developed and well-nourished. No distress.  HENT:  Head: Normocephalic.  Neck: Neck supple.  Cardiovascular: Normal rate.   Pulmonary/Chest: Effort normal. She has no wheezes.  Musculoskeletal: Normal range of motion. She exhibits no edema.  Skin: Rash noted.  Scattered raised papules on bilateral upper forearms with no drainage or surrounding erythema.  Papules are slightly indurated,  There is no fluctuance. Some have tiny central punctum suggestive of an early pustular outbreak.  Others without central pointing or pustule.  Upper back with excoriations but no appreciable rash.    ED Course  Procedures (including critical care time)  Labs Reviewed - No data to display No results found.   1. Folliculitis       MDM  Possible insect bites,  But with numerous lesions having pustular appearance,  Pt tx with topical abx cleocin, advised to complete her bactrim(has 2 tabs left) .  Rash does not resemble drug reaction.  Benadryl.  Recheck by pcp if not better over the next week.        Burgess Amor, [place] 10/18/11  1446

## 2011-11-11 DIAGNOSIS — R079 Chest pain, unspecified: Secondary | ICD-10-CM

## 2011-11-27 ENCOUNTER — Other Ambulatory Visit (HOSPITAL_COMMUNITY): Payer: Self-pay | Admitting: Family Medicine

## 2011-11-27 ENCOUNTER — Ambulatory Visit (HOSPITAL_COMMUNITY)
Admission: RE | Admit: 2011-11-27 | Discharge: 2011-11-27 | Disposition: A | Discharge status: Home / Self Care | Payer: Medicaid Other | Source: Ambulatory Visit | Attending: Family Medicine | Admitting: Family Medicine

## 2011-11-27 DIAGNOSIS — M1990 Unspecified osteoarthritis, unspecified site: Secondary | ICD-10-CM | POA: Insufficient documentation

## 2011-11-27 DIAGNOSIS — M25569 Pain in unspecified knee: Secondary | ICD-10-CM | POA: Insufficient documentation

## 2012-05-07 ENCOUNTER — Emergency Department (HOSPITAL_COMMUNITY)
Admission: EM | Admit: 2012-05-07 | Discharge: 2012-05-07 | Disposition: A | Discharge status: Home / Self Care | Payer: Medicaid Other | Attending: Emergency Medicine | Admitting: Emergency Medicine

## 2012-05-07 ENCOUNTER — Emergency Department (HOSPITAL_COMMUNITY): Payer: Medicaid Other

## 2012-05-07 ENCOUNTER — Encounter (HOSPITAL_COMMUNITY): Payer: Self-pay | Admitting: *Deleted

## 2012-05-07 DIAGNOSIS — IMO0001 Reserved for inherently not codable concepts without codable children: Secondary | ICD-10-CM | POA: Insufficient documentation

## 2012-05-07 DIAGNOSIS — J40 Bronchitis, not specified as acute or chronic: Secondary | ICD-10-CM | POA: Insufficient documentation

## 2012-05-07 DIAGNOSIS — R110 Nausea: Secondary | ICD-10-CM | POA: Insufficient documentation

## 2012-05-07 DIAGNOSIS — J45909 Unspecified asthma, uncomplicated: Secondary | ICD-10-CM | POA: Insufficient documentation

## 2012-05-07 DIAGNOSIS — Z79899 Other long term (current) drug therapy: Secondary | ICD-10-CM | POA: Insufficient documentation

## 2012-05-07 DIAGNOSIS — M791 Myalgia, unspecified site: Secondary | ICD-10-CM

## 2012-05-07 DIAGNOSIS — R05 Cough: Secondary | ICD-10-CM | POA: Insufficient documentation

## 2012-05-07 DIAGNOSIS — R059 Cough, unspecified: Secondary | ICD-10-CM | POA: Insufficient documentation

## 2012-05-07 DIAGNOSIS — E7800 Pure hypercholesterolemia, unspecified: Secondary | ICD-10-CM | POA: Insufficient documentation

## 2012-05-07 DIAGNOSIS — R42 Dizziness and giddiness: Secondary | ICD-10-CM | POA: Insufficient documentation

## 2012-05-07 MED ORDER — MECLIZINE HCL 25 MG PO TABS: 25.0000 mg | ORAL_TABLET | Freq: Four times a day (QID) | ORAL | Status: DC | PRN

## 2012-05-07 MED ORDER — METHOCARBAMOL 500 MG PO TABS
1000.0000 mg | ORAL_TABLET | Freq: Once | ORAL | Status: AC
  Administered 2012-05-07: 1000 mg via ORAL
  Filled 2012-05-07: qty 2

## 2012-05-07 MED ORDER — POTASSIUM CHLORIDE CRYS ER 20 MEQ PO TBCR: 20.0000 meq | EXTENDED_RELEASE_TABLET | Freq: Two times a day (BID) | ORAL | Status: DC

## 2012-05-07 MED ORDER — IBUPROFEN 800 MG PO TABS
800.0000 mg | ORAL_TABLET | Freq: Once | ORAL | Status: AC
Start: 2012-05-07 — End: 2012-05-07
  Administered 2012-05-07: 800 mg via ORAL
  Filled 2012-05-07: qty 1

## 2012-05-07 MED ORDER — CYCLOBENZAPRINE HCL 5 MG PO TABS: 5.0000 mg | ORAL_TABLET | Freq: Three times a day (TID) | ORAL | Status: DC | PRN

## 2012-05-07 MED ORDER — ONDANSETRON HCL 4 MG PO TABS: 4.0000 mg | ORAL_TABLET | Freq: Four times a day (QID) | ORAL | Status: DC

## 2012-05-07 MED ORDER — ONDANSETRON 8 MG PO TBDP
8.0000 mg | ORAL_TABLET | Freq: Once | ORAL | Status: AC
  Administered 2012-05-07: 8 mg via ORAL
  Filled 2012-05-07: qty 1

## 2012-05-07 MED ORDER — MECLIZINE HCL 12.5 MG PO TABS
25.0000 mg | ORAL_TABLET | Freq: Once | ORAL | Status: AC
  Administered 2012-05-07: 25 mg via ORAL
  Filled 2012-05-07: qty 2

## 2012-05-07 NOTE — ED Notes (Signed)
Pt unable to void at this time. 

## 2012-05-07 NOTE — ED Notes (Signed)
Dizziness with nausea 

## 2012-05-07 NOTE — ED Notes (Signed)
Discharge instructions reviewed with pt, questions answered. Pt verbalized understanding.  

## 2012-05-07 NOTE — ED Provider Notes (Addendum)
History  This chart was scribed for Ward Givens, MD by Bennett Scrape, ED Scribe. This patient was seen in room APA01/APA01 and the patient's care was started at 6:24 PM.  CSN: [id number]  Arrival date & time 05/07/12  1812   First MD Initiated Contact with Patient 05/07/12 1824      Chief Complaint  Patient presents with  . Dizziness     The history is provided by the patient. No language interpreter was used.    ALEJANDRIA WESSELLS is a 32 y.o. female who presents to the Emergency Department complaining of 3 hours of sudden onset, intermittent, non-changing dizziness described as the room spining with associated nausea, sternally located CP soreness, myalgias, and non-productive cough. The nausea started 15 minutes ago. She reports taking mucinex DM, last dose 4 hours ago, with mild improvement in her cough. The CP is worse with deep breathing and coughing. The nausea is worse with the supine position and improved in the fetal positions. She denies having any sick contacts with similar symptoms.  She denies fevers, chills, sore throat, rhinorrhea, urinary symptoms, emesis, diarrhea and otalgia as associated symptoms. She reports that she ran out of pravastatin and Seroquel one month ago. She denies smoking and alcohol use.  PCP is Dr. Delbert Harness.  Past Medical History  Diagnosis Date  . Asthma   . Bronchitis   . High cholesterol     History reviewed. No pertinent past surgical history.  No family history on file.  History  Substance Use Topics  . Smoking status: Never Smoker   . Smokeless tobacco: Not on file  . Alcohol Use: No  works a Art therapist  No OB history provided.  Review of Systems  Constitutional: Negative for fever and chills.  HENT: Negative for sore throat.   Respiratory: Positive for cough. Negative for shortness of breath.   Gastrointestinal: Positive for nausea. Negative for vomiting, abdominal pain and diarrhea.   Genitourinary: Negative for dysuria and frequency.  Musculoskeletal: Positive for myalgias.  Neurological: Positive for dizziness. Negative for headaches.  All other systems reviewed and are negative.    Allergies  Darvocet; Hydrocodone-acetaminophen; Penicillins; and Percocet  Home Medications   Current Outpatient Rx  Name  Route  Sig  Dispense  Refill  . acetaminophen (TYLENOL) 500 MG tablet   Oral   Take 1,000 mg by mouth daily as needed for pain.         . pravastatin (PRAVACHOL) 40 MG tablet   Oral   Take 40 mg by mouth daily.             Triage Vitals: BP 128/80  Pulse 79  Temp(Src) 98.1 F (36.7 C) (Oral)  Resp 22  Ht 5\' 4"  (1.626 m)  Wt 217 lb (98.431 kg)  BMI 37.23 kg/m2  SpO2 98%  LMP 05/01/2012  Vital signs normal     Physical Exam  Nursing note and vitals reviewed. Constitutional: She is oriented to person, place, and time. She appears well-developed and well-nourished.  Non-toxic appearance. She does not appear ill. No distress.  HENT:  Head: Normocephalic and atraumatic.  Right Ear: External ear normal.  Left Ear: External ear normal.  Nose: Nose normal. No mucosal edema or rhinorrhea.  Mouth/Throat: Oropharynx is clear and moist and mucous membranes are normal. No dental abscesses or edematous.  Eyes: Conjunctivae and EOM are normal. Pupils are equal, round, and reactive to light.  Neck: Normal range of motion and full  passive range of motion without pain. Neck supple.  Cardiovascular: Normal rate, regular rhythm and normal heart sounds.  Exam reveals no gallop and no friction rub.   No murmur heard. Pulmonary/Chest: Effort normal and breath sounds normal. No respiratory distress. She has no wheezes. She has no rhonchi. She has no rales. She exhibits tenderness (tenderness at the costochondral joints). She exhibits no crepitus.  Abdominal: Soft. Normal appearance and bowel sounds are normal. She exhibits no distension. There is no tenderness.  There is no rebound and no guarding.  Musculoskeletal: Normal range of motion. She exhibits no edema and no tenderness.  Moves all extremities well.   Neurological: She is alert and oriented to person, place, and time. She has normal strength. No cranial nerve deficit.  Skin: Skin is warm, dry and intact. No rash noted. No erythema. No pallor.  Psychiatric: She has a normal mood and affect. Her speech is normal and behavior is normal. Her mood appears not anxious.    ED Course  Procedures (including critical care time)  Medications  ondansetron (ZOFRAN-ODT) disintegrating tablet 8 mg (8 mg Oral Given 05/07/12 1922)  meclizine (ANTIVERT) tablet 25 mg (25 mg Oral Given 05/07/12 1922)  ibuprofen (ADVIL,MOTRIN) tablet 800 mg (800 mg Oral Given 05/07/12 1922)  methocarbamol (ROBAXIN) tablet 1,000 mg (1,000 mg Oral Given 05/07/12 1922)    DIAGNOSTIC STUDIES: Oxygen Saturation is 98% on room air, normal by my interpretation.    COORDINATION OF CARE: 6:52 PM-Discussed treatment plan which includes medications and CXR with pt at bedside and pt agreed to plan.   8:11 PM-Pt rechecked and still complains of symptoms with medications listed above but reports that she hasn't taken the pills because she needs ice in her water. Informed pt of radiology results.  Dg Chest 2 View  05/07/2012  *RADIOLOGY REPORT*  Clinical Data: Cough and body aches.  CHEST - 2 VIEW  Comparison: Two-view chest 08/31/2011.  Findings: The heart size is normal.  The lungs are clear.  The expansile posterior left rib lesion is stable.  The visualized soft tissues and bony thorax are otherwise  unremarkable.  IMPRESSION:  1.  No acute cardiopulmonary disease. 2.  Stable benign left posterior rib lesion.   Original Report Authenticated By: Marin Roberts, M.D.     1. Dizziness   2. Muscle ache   3. Bronchitis   4. Nausea    Discharge Medication List as of 05/07/2012  9:43 PM    START taking these medications   Details   cyclobenzaprine (FLEXERIL) 5 MG tablet Take 1 tablet (5 mg total) by mouth 3 (three) times daily as needed for muscle spasms., Starting 05/07/2012, Until Discontinued, Print    meclizine (ANTIVERT) 25 MG tablet Take 1 tablet (25 mg total) by mouth 4 (four) times daily as needed for dizziness or nausea., Starting 05/07/2012, Until Discontinued, Print    ondansetron (ZOFRAN) 4 MG tablet Take 1 tablet (4 mg total) by mouth every 6 (six) hours., Starting 05/07/2012, Until Discontinued, Print        Plan discharge  Devoria Albe, MD, FACEP    MDM    I personally performed the services described in this documentation, which was scribed in my presence. The recorded information has been reviewed and considered.  Devoria Albe, MD, FACEP    Ward Givens, MD 05/08/12 1610  Ward Givens, MD 05/08/12 9604

## 2012-06-09 ENCOUNTER — Emergency Department (HOSPITAL_COMMUNITY)
Admission: EM | Admit: 2012-06-09 | Discharge: 2012-06-09 | Disposition: A | Discharge status: Home / Self Care | Payer: Medicaid Other | Attending: Emergency Medicine | Admitting: Emergency Medicine

## 2012-06-09 ENCOUNTER — Encounter (HOSPITAL_COMMUNITY): Payer: Self-pay | Admitting: *Deleted

## 2012-06-09 DIAGNOSIS — R1310 Dysphagia, unspecified: Secondary | ICD-10-CM | POA: Insufficient documentation

## 2012-06-09 DIAGNOSIS — E7800 Pure hypercholesterolemia, unspecified: Secondary | ICD-10-CM | POA: Insufficient documentation

## 2012-06-09 DIAGNOSIS — Z79899 Other long term (current) drug therapy: Secondary | ICD-10-CM | POA: Insufficient documentation

## 2012-06-09 DIAGNOSIS — J020 Streptococcal pharyngitis: Secondary | ICD-10-CM

## 2012-06-09 DIAGNOSIS — Z8709 Personal history of other diseases of the respiratory system: Secondary | ICD-10-CM | POA: Insufficient documentation

## 2012-06-09 DIAGNOSIS — J45909 Unspecified asthma, uncomplicated: Secondary | ICD-10-CM | POA: Insufficient documentation

## 2012-06-09 LAB — RAPID STREP SCREEN (MED CTR MEBANE ONLY): Streptococcus, Group A Screen (Direct): POSITIVE — AB

## 2012-06-09 MED ORDER — AZITHROMYCIN 250 MG PO TABS: ORAL_TABLET | ORAL | Status: DC

## 2012-06-09 MED ORDER — AZITHROMYCIN 250 MG PO TABS
500.0000 mg | ORAL_TABLET | Freq: Once | ORAL | Status: AC
  Administered 2012-06-09: 500 mg via ORAL
  Filled 2012-06-09: qty 2

## 2012-06-09 NOTE — ED Provider Notes (Signed)
History     CSN: [id number]  Arrival date & time 06/09/12  0408   First MD Initiated Contact with Patient 06/09/12 0425      Chief Complaint  Patient presents with  . Sore Throat    (Consider location/radiation/quality/duration/timing/severity/associated sxs/prior treatment) HPI Breanna Malone is a 32 y.o. female who presents to the Emergency Department complaining of sore throat for several days now difficult to swallow due to pain. She has used salt water gargles for the last two days without relief.    PCP Dr. Janna Arch Past Medical History  Diagnosis Date  . Asthma   . Bronchitis   . High cholesterol     History reviewed. No pertinent past surgical history.  No family history on file.  History  Substance Use Topics  . Smoking status: Never Smoker   . Smokeless tobacco: Not on file  . Alcohol Use: No    OB History   Grav Para Term Preterm Abortions TAB SAB Ect Mult Living                  Review of Systems  Constitutional: Negative for fever.       10 Systems reviewed and are negative for acute change except as noted in the HPI.  HENT: Positive for sore throat. Negative for congestion.   Eyes: Negative for discharge and redness.  Respiratory: Negative for cough and shortness of breath.   Cardiovascular: Negative for chest pain.  Gastrointestinal: Negative for vomiting and abdominal pain.  Musculoskeletal: Negative for back pain.  Skin: Negative for rash.  Neurological: Negative for syncope, numbness and headaches.  Psychiatric/Behavioral:       No behavior change.    Allergies  Darvocet; Hydrocodone-acetaminophen; Penicillins; and Percocet  Home Medications   Current Outpatient Rx  Name  Route  Sig  Dispense  Refill  . acetaminophen (TYLENOL) 500 MG tablet   Oral   Take 1,000 mg by mouth daily as needed for pain.         Marland Kitchen gabapentin (NEURONTIN) 300 MG capsule   Oral   Take 300 mg by mouth at bedtime.         . pravastatin (PRAVACHOL) 40  MG tablet   Oral   Take 40 mg by mouth daily.           . Vortioxetine HBr (BRINTELLIX) 10 MG TABS   Oral   Take 10 mg by mouth daily.         . cyclobenzaprine (FLEXERIL) 5 MG tablet   Oral   Take 1 tablet (5 mg total) by mouth 3 (three) times daily as needed for muscle spasms.   30 tablet   0   . meclizine (ANTIVERT) 25 MG tablet   Oral   Take 1 tablet (25 mg total) by mouth 4 (four) times daily as needed for dizziness or nausea.   30 tablet   0   . ondansetron (ZOFRAN) 4 MG tablet   Oral   Take 1 tablet (4 mg total) by mouth every 6 (six) hours.   6 tablet   0     BP 122/88  Pulse 88  Temp(Src) 99.2 F (37.3 C) (Oral)  Resp 20  Ht 5\' 4"  (1.626 m)  Wt 225 lb (102.059 kg)  BMI 38.6 kg/m2  SpO2 94%  LMP 05/31/2012  Physical Exam  Nursing note and vitals reviewed. Constitutional: She appears well-developed and well-nourished.  Awake, alert, nontoxic appearance.  HENT:  Head: Normocephalic and atraumatic.  Right Ear: External ear normal.  Left Ear: External ear normal.  Mouth/Throat: Oropharynx is clear and moist.  Large erythematous tonsils, no exudate  Eyes: EOM are normal. Pupils are equal, round, and reactive to light. Right eye exhibits no discharge. Left eye exhibits no discharge.  Neck: Neck supple.  Cardiovascular: Normal rate and intact distal pulses.   Pulmonary/Chest: Effort normal and breath sounds normal. She exhibits no tenderness.  Abdominal: Soft. Bowel sounds are normal. There is no tenderness. There is no rebound.  Musculoskeletal: She exhibits no tenderness.  Baseline ROM, no obvious new focal weakness.  Neurological:  Mental status and motor strength appears baseline for patient and situation.  Skin: No rash noted.  Psychiatric: She has a normal mood and affect.    ED Course  Procedures (including critical care time)  Results for orders placed during the hospital encounter of 06/09/12  RAPID STREP SCREEN      Result Value Range    Streptococcus, Group A Screen (Direct) POSITIVE (*) NEGATIVE   Medications  azithromycin (ZITHROMAX) tablet 500 mg (500 mg Oral Given 06/09/12 0512)     MDM  Patient with sore throat that started last night. Strep screen is positive for strep. She is allergic to penicillin. Given zithromax. Pt stable in ED with no significant deterioration in condition.The patient appears reasonably screened and/or stabilized for discharge and I doubt any other medical condition or other [facility] requiring further screening, evaluation, or treatment in the ED at this time prior to discharge.  MDM Reviewed: nursing note and vitals Interpretation: labs           Nicoletta Dress. Colon Branch, MD 06/09/12 [phone]

## 2012-06-09 NOTE — ED Notes (Signed)
Pt reports a sore throat that started last night, pt states hurts to swallow & talk. denies any fever. Pt states soreness in chest & back but denies coughing

## 2012-06-11 ENCOUNTER — Encounter (HOSPITAL_COMMUNITY): Payer: Self-pay

## 2012-06-11 DIAGNOSIS — N3090 Cystitis, unspecified without hematuria: Secondary | ICD-10-CM | POA: Insufficient documentation

## 2012-06-11 DIAGNOSIS — E7800 Pure hypercholesterolemia, unspecified: Secondary | ICD-10-CM | POA: Insufficient documentation

## 2012-06-11 DIAGNOSIS — Z79899 Other long term (current) drug therapy: Secondary | ICD-10-CM | POA: Insufficient documentation

## 2012-06-11 DIAGNOSIS — Z8709 Personal history of other diseases of the respiratory system: Secondary | ICD-10-CM | POA: Insufficient documentation

## 2012-06-11 DIAGNOSIS — J45909 Unspecified asthma, uncomplicated: Secondary | ICD-10-CM | POA: Insufficient documentation

## 2012-06-11 DIAGNOSIS — N390 Urinary tract infection, site not specified: Secondary | ICD-10-CM | POA: Insufficient documentation

## 2012-06-11 DIAGNOSIS — Z880 Allergy status to penicillin: Secondary | ICD-10-CM | POA: Insufficient documentation

## 2012-06-11 NOTE — ED Notes (Signed)
I think I have a bladder infection per pt. Burns with urination per pt.

## 2012-06-12 ENCOUNTER — Emergency Department (HOSPITAL_COMMUNITY)
Admission: EM | Admit: 2012-06-12 | Discharge: 2012-06-12 | Disposition: A | Discharge status: Home / Self Care | Payer: Medicaid Other | Attending: Emergency Medicine | Admitting: Emergency Medicine

## 2012-06-12 DIAGNOSIS — N390 Urinary tract infection, site not specified: Secondary | ICD-10-CM

## 2012-06-12 LAB — URINE MICROSCOPIC-ADD ON

## 2012-06-12 LAB — URINALYSIS, ROUTINE W REFLEX MICROSCOPIC
Glucose, UA: NEGATIVE mg/dL
Ketones, ur: NEGATIVE mg/dL
Specific Gravity, Urine: 1.025 (ref 1.005–1.030)
pH: 7 (ref 5.0–8.0)

## 2012-06-12 MED ORDER — NITROFURANTOIN MACROCRYSTAL 100 MG PO CAPS
100.0000 mg | ORAL_CAPSULE | Freq: Once | ORAL | Status: AC
  Administered 2012-06-12: 100 mg via ORAL
  Filled 2012-06-12: qty 1

## 2012-06-12 MED ORDER — PHENAZOPYRIDINE HCL 200 MG PO TABS: 200.0000 mg | ORAL_TABLET | Freq: Three times a day (TID) | ORAL | Status: DC

## 2012-06-12 MED ORDER — PHENAZOPYRIDINE HCL 100 MG PO TABS
200.0000 mg | ORAL_TABLET | Freq: Once | ORAL | Status: AC
  Administered 2012-06-12: 200 mg via ORAL
  Filled 2012-06-12 (×2): qty 1

## 2012-06-12 MED ORDER — NITROFURANTOIN MACROCRYSTAL 100 MG PO CAPS: 100.0000 mg | ORAL_CAPSULE | Freq: Four times a day (QID) | ORAL | Status: DC

## 2012-06-12 NOTE — ED Provider Notes (Signed)
History     CSN: [id number]  Arrival date & time 06/11/12  2226   First MD Initiated Contact with Patient 06/12/12 0101      Chief Complaint  Patient presents with  . Cystitis    (Consider location/radiation/quality/duration/timing/severity/associated sxs/prior treatment) HPI Breanna Malone is a 32 y.o. female who presents to the Emergency Department complaining of urinary frequency, burning, and dysuria since yesterday. Denies fever, chills, nausea, vomiting.  Has taken no medicines.   PCP Dr. Janna Arch Past Medical History  Diagnosis Date  . Asthma   . Bronchitis   . High cholesterol     History reviewed. No pertinent past surgical history.  No family history on file.  History  Substance Use Topics  . Smoking status: Never Smoker   . Smokeless tobacco: Not on file  . Alcohol Use: No    OB History   Grav Para Term Preterm Abortions TAB SAB Ect Mult Living                  Review of Systems  Constitutional: Negative for fever.       10 Systems reviewed and are negative for acute change except as noted in the HPI.  HENT: Negative for congestion.   Eyes: Negative for discharge and redness.  Respiratory: Negative for cough and shortness of breath.   Cardiovascular: Negative for chest pain.  Gastrointestinal: Negative for vomiting and abdominal pain.  Genitourinary: Positive for dysuria, urgency and frequency.  Musculoskeletal: Negative for back pain.  Skin: Negative for rash.  Neurological: Negative for syncope, numbness and headaches.  Psychiatric/Behavioral:       No behavior change.    Allergies  Darvocet; Hydrocodone-acetaminophen; Penicillins; and Percocet  Home Medications   Current Outpatient Rx  Name  Route  Sig  Dispense  Refill  . acetaminophen (TYLENOL) 500 MG tablet   Oral   Take 1,000 mg by mouth daily as needed for pain.         Marland Kitchen azithromycin (ZITHROMAX) 250 MG tablet      Take  1 every day until finished.   4 tablet   0   .  cyclobenzaprine (FLEXERIL) 5 MG tablet   Oral   Take 1 tablet (5 mg total) by mouth 3 (three) times daily as needed for muscle spasms.   30 tablet   0   . gabapentin (NEURONTIN) 300 MG capsule   Oral   Take 300 mg by mouth at bedtime.         . meclizine (ANTIVERT) 25 MG tablet   Oral   Take 1 tablet (25 mg total) by mouth 4 (four) times daily as needed for dizziness or nausea.   30 tablet   0   . ondansetron (ZOFRAN) 4 MG tablet   Oral   Take 1 tablet (4 mg total) by mouth every 6 (six) hours.   6 tablet   0   . pravastatin (PRAVACHOL) 40 MG tablet   Oral   Take 40 mg by mouth daily.           . Vortioxetine HBr (BRINTELLIX) 10 MG TABS   Oral   Take 10 mg by mouth daily.           BP 114/68  Pulse 69  Resp 18  Ht 5\' 4"  (1.626 m)  Wt 216 lb 8 oz (98.204 kg)  BMI 37.14 kg/m2  SpO2 100%  LMP 05/31/2012  Physical Exam  Nursing note and vitals reviewed. Constitutional:  Awake, alert, nontoxic appearance.  HENT:  Head: Normocephalic and atraumatic.  Eyes: EOM are normal. Pupils are equal, round, and reactive to light.  Neck: Neck supple.  Cardiovascular: Normal rate and intact distal pulses.   Pulmonary/Chest: Effort normal and breath sounds normal. She exhibits no tenderness.  Abdominal: Soft. Bowel sounds are normal. There is no tenderness. There is no rebound.  Genitourinary:  No cva tenderness  Musculoskeletal: She exhibits no tenderness.  Baseline ROM, no obvious new focal weakness.  Neurological:  Mental status and motor strength appears baseline for patient and situation.  Skin: No rash noted.  Psychiatric: She has a normal mood and affect.    ED Course  Procedures (including critical care time) Results for orders placed during the hospital encounter of 06/12/12  URINALYSIS, ROUTINE W REFLEX MICROSCOPIC      Result Value Range   Color, Urine YELLOW  YELLOW   APPearance HAZY (*) CLEAR   Specific Gravity, Urine 1.025  1.005 - 1.030   pH 7.0   5.0 - 8.0   Glucose, UA NEGATIVE  NEGATIVE mg/dL   Hgb urine dipstick TRACE (*) NEGATIVE   Bilirubin Urine SMALL (*) NEGATIVE   Ketones, ur NEGATIVE  NEGATIVE mg/dL   Protein, ur 409 (*) NEGATIVE mg/dL   Urobilinogen, UA 0.2  0.0 - 1.0 mg/dL   Nitrite NEGATIVE  NEGATIVE   Leukocytes, UA SMALL (*) NEGATIVE  URINE MICROSCOPIC-ADD ON      Result Value Range   Squamous Epithelial / LPF FEW (*) RARE   WBC, UA TOO NUMEROUS TO COUNT  <3 WBC/hpf   RBC / HPF 0-2  <3 RBC/hpf   Bacteria, UA MANY (*) RARE   Casts GRANULAR CAST (*) NEGATIVE   Urine-Other AMORPHOUS URATES/PHOSPHATES     Medications  nitrofurantoin (MACRODANTIN) capsule 100 mg (100 mg Oral Given 06/12/12 0229)  phenazopyridine (PYRIDIUM) tablet 200 mg (200 mg Oral Given 06/12/12 0229)      MDM  Patient with symptoms of urinary tract infection with frequency, urgency and dysuria. UA shows infection. Given antibiotics and pyridium. Pt stable in ED with no significant deterioration in condition.The patient appears reasonably screened and/or stabilized for discharge and I doubt any other medical condition or other [facility] requiring further screening, evaluation, or treatment in the ED at this time prior to discharge.  MDM Reviewed: nursing note and vitals Interpretation: labs          Nicoletta Dress. Colon Branch, MD 06/12/12 [phone]

## 2012-06-15 LAB — URINE CULTURE

## 2012-06-16 NOTE — ED Notes (Signed)
+   urine Patient treated with Macrobid-sensitive to same-chart appended per protocol MD. 

## 2012-06-17 ENCOUNTER — Encounter: Payer: Self-pay | Admitting: *Deleted

## 2012-06-18 ENCOUNTER — Encounter: Payer: Self-pay | Admitting: *Deleted

## 2012-06-18 ENCOUNTER — Ambulatory Visit: Payer: Self-pay

## 2012-06-18 ENCOUNTER — Other Ambulatory Visit: Payer: Self-pay

## 2012-06-25 ENCOUNTER — Telehealth: Payer: Self-pay | Admitting: *Deleted

## 2012-06-25 NOTE — Telephone Encounter (Signed)
No answer x 1. JSY

## 2012-06-26 NOTE — Telephone Encounter (Signed)
Spoke with pt. Having abnormal periods. Not on any birth control. Period started 06/01/12 then again on 06/25/12. appt scheduled to discuss.

## 2012-07-07 ENCOUNTER — Ambulatory Visit: Payer: Self-pay | Admitting: Obstetrics & Gynecology

## 2012-07-08 ENCOUNTER — Telehealth: Payer: Self-pay | Admitting: Obstetrics & Gynecology

## 2012-07-08 NOTE — Telephone Encounter (Signed)
"  hurting bilateral breast starting today," c/o nausea and vomiting, weakness. Pt states already owes a balance. Spoke with Samule Dry, stated pt does owe balance and will call pt back to discuss balance and appt. Pt aware.

## 2012-07-09 ENCOUNTER — Telehealth: Payer: Self-pay | Admitting: *Deleted

## 2012-07-09 NOTE — Telephone Encounter (Signed)
Spoke with pt yesterday and call was transferred to front staff to discuss pt balance and an appt.

## 2012-07-12 ENCOUNTER — Emergency Department (HOSPITAL_COMMUNITY)
Admission: EM | Admit: 2012-07-12 | Discharge: 2012-07-12 | Disposition: A | Discharge status: Home / Self Care | Payer: Medicaid Other | Attending: Emergency Medicine | Admitting: Emergency Medicine

## 2012-07-12 ENCOUNTER — Encounter (HOSPITAL_COMMUNITY): Payer: Self-pay | Admitting: Emergency Medicine

## 2012-07-12 DIAGNOSIS — R10816 Epigastric abdominal tenderness: Secondary | ICD-10-CM | POA: Insufficient documentation

## 2012-07-12 DIAGNOSIS — E7800 Pure hypercholesterolemia, unspecified: Secondary | ICD-10-CM | POA: Insufficient documentation

## 2012-07-12 DIAGNOSIS — R300 Dysuria: Secondary | ICD-10-CM | POA: Insufficient documentation

## 2012-07-12 DIAGNOSIS — Z880 Allergy status to penicillin: Secondary | ICD-10-CM | POA: Insufficient documentation

## 2012-07-12 DIAGNOSIS — Z3202 Encounter for pregnancy test, result negative: Secondary | ICD-10-CM | POA: Insufficient documentation

## 2012-07-12 DIAGNOSIS — N644 Mastodynia: Secondary | ICD-10-CM | POA: Insufficient documentation

## 2012-07-12 DIAGNOSIS — Z9889 Other specified postprocedural states: Secondary | ICD-10-CM | POA: Insufficient documentation

## 2012-07-12 DIAGNOSIS — R109 Unspecified abdominal pain: Secondary | ICD-10-CM | POA: Insufficient documentation

## 2012-07-12 DIAGNOSIS — Z8709 Personal history of other diseases of the respiratory system: Secondary | ICD-10-CM | POA: Insufficient documentation

## 2012-07-12 DIAGNOSIS — Z79899 Other long term (current) drug therapy: Secondary | ICD-10-CM | POA: Insufficient documentation

## 2012-07-12 DIAGNOSIS — R112 Nausea with vomiting, unspecified: Secondary | ICD-10-CM | POA: Insufficient documentation

## 2012-07-12 DIAGNOSIS — J45909 Unspecified asthma, uncomplicated: Secondary | ICD-10-CM | POA: Insufficient documentation

## 2012-07-12 LAB — URINE MICROSCOPIC-ADD ON

## 2012-07-12 LAB — CBC WITH DIFFERENTIAL/PLATELET
Basophils Relative: 0 % (ref 0–1)
Eosinophils Absolute: 0.1 10*3/uL (ref 0.0–0.7)
Eosinophils Relative: 1 % (ref 0–5)
HCT: 36.8 % (ref 36.0–46.0)
Hemoglobin: 12.2 g/dL (ref 12.0–15.0)
MCH: 31.4 pg (ref 26.0–34.0)
MCHC: 33.2 g/dL (ref 30.0–36.0)
Monocytes Absolute: 0.4 10*3/uL (ref 0.1–1.0)
Monocytes Relative: 5 % (ref 3–12)

## 2012-07-12 LAB — URINALYSIS, ROUTINE W REFLEX MICROSCOPIC
Glucose, UA: NEGATIVE mg/dL
Ketones, ur: NEGATIVE mg/dL
Protein, ur: NEGATIVE mg/dL
pH: 7.5 (ref 5.0–8.0)

## 2012-07-12 LAB — COMPREHENSIVE METABOLIC PANEL
Albumin: 3.9 g/dL (ref 3.5–5.2)
BUN: 6 mg/dL (ref 6–23)
Creatinine, Ser: 0.71 mg/dL (ref 0.50–1.10)
Total Protein: 7.1 g/dL (ref 6.0–8.3)

## 2012-07-12 LAB — AMYLASE: Amylase: 52 U/L (ref 0–105)

## 2012-07-12 MED ORDER — ONDANSETRON HCL 4 MG/2ML IJ SOLN
4.0000 mg | Freq: Once | INTRAMUSCULAR | Status: AC
  Administered 2012-07-12: 4 mg via INTRAVENOUS
  Filled 2012-07-12: qty 2

## 2012-07-12 MED ORDER — FAMOTIDINE IN NACL 20-0.9 MG/50ML-% IV SOLN
20.0000 mg | Freq: Once | INTRAVENOUS | Status: AC
  Administered 2012-07-12: 20 mg via INTRAVENOUS
  Filled 2012-07-12: qty 50

## 2012-07-12 MED ORDER — PROMETHAZINE HCL 12.5 MG PO TABS: 12.5000 mg | ORAL_TABLET | Freq: Four times a day (QID) | ORAL | Status: DC | PRN

## 2012-07-12 MED ORDER — SODIUM CHLORIDE 0.9 % IV SOLN
INTRAVENOUS | Status: DC
  Administered 2012-07-12: 14:00:00 via INTRAVENOUS

## 2012-07-12 MED ORDER — RANITIDINE HCL 150 MG PO TABS: 150.0000 mg | ORAL_TABLET | Freq: Two times a day (BID) | ORAL | Status: DC

## 2012-07-12 NOTE — ED Provider Notes (Signed)
History     CSN: [id number]  Arrival date & time 07/12/12  1059   First MD Initiated Contact with Patient 07/12/12 1107      Chief Complaint  Patient presents with  . Nausea  . Emesis  . Breast Pain    (Consider location/radiation/quality/duration/timing/severity/associated sxs/prior treatment) Patient is a 32 y.o. female presenting with vomiting. The history is provided by the patient.  Emesis Severity:  Moderate Duration:  3 weeks Timing:  Intermittent Number of daily episodes:  2 Quality:  Undigested food Able to tolerate:  Liquids Progression:  Unchanged Chronicity:  New Recent urination:  Normal Relieved by:  Nothing Worsened by:  Food smell Ineffective treatments:  None tried Associated symptoms: no abdominal pain, no chills, no fever, no headaches, no myalgias, no sore throat and no URI   Risk factors: no diabetes    HAVA MASSINGALE is a 32 y.o. female G6 P5 003 who presents to the ED with nausea and vomiting x 3 weeks. Associated symptoms include bilateral breast tenderness x 1 week and abdominal pain. LMP 06/29/12 but was very light. Off of birth control since November. No condoms. Current sex partner x 16 years. No history of STI's. Last pap smear one year ago and was normal with Dr. Despina Hidden.   Past Medical History  Diagnosis Date  . Asthma   . Bronchitis   . High cholesterol   . Abnormal Pap smear   . Elevated cholesterol     Past Surgical History  Procedure Laterality Date  . Hernia repair      Family History  Problem Relation Age of Onset  . Asthma Other   . Bronchitis Other     History  Substance Use Topics  . Smoking status: Never Smoker   . Smokeless tobacco: Not on file  . Alcohol Use: No    OB History   Grav Para Term Preterm Abortions TAB SAB Ect Mult Living   6 5 5  1  1   3       Review of Systems  Constitutional: Negative for chills.  HENT: Negative for sore throat.   Respiratory: Negative for chest tightness and shortness of  breath.   Cardiovascular: Chest pain: breast pain.  Gastrointestinal: Positive for vomiting. Negative for abdominal pain.  Genitourinary: Positive for dysuria. Negative for vaginal bleeding, vaginal discharge and pelvic pain.  Musculoskeletal: Negative for myalgias.  Skin: Negative for rash.  Neurological: Negative for syncope, light-headedness and headaches.  Psychiatric/Behavioral: The patient is not nervous/anxious.     Allergies  Darvocet; Hydrocodone-acetaminophen; Penicillins; and Percocet  Home Medications   Current Outpatient Rx  Name  Route  Sig  Dispense  Refill  . acetaminophen (TYLENOL) 500 MG tablet   Oral   Take 1,000 mg by mouth every 6 (six) hours as needed for pain.          . pravastatin (PRAVACHOL) 40 MG tablet   Oral   Take 40 mg by mouth daily.             BP 102/64  Pulse 65  Temp(Src) 98.5 F (36.9 C) (Oral)  Resp 20  SpO2 98%  LMP 06/23/2012  Physical Exam  Nursing note and vitals reviewed. Constitutional: She is oriented to person, place, and time. She appears well-developed and well-nourished. No distress.  HENT:  Head: Normocephalic and atraumatic.  Eyes: EOM are normal.  Neck: Normal range of motion. Neck supple.  Cardiovascular: Normal rate and normal heart sounds.   Pulmonary/Chest: Effort  normal and breath sounds normal.  Abdominal: Soft. There is tenderness in the epigastric area. There is no rigidity, no rebound, no guarding and no CVA tenderness.  Musculoskeletal: Normal range of motion.  Neurological: She is alert and oriented to person, place, and time. No cranial nerve deficit.  Skin: Skin is warm and dry.  Psychiatric: She has a normal mood and affect. Her behavior is normal. Judgment and thought content normal.    ED Course  Procedures (including critical care time)  Results for orders placed during the hospital encounter of 07/12/12 (from the past 24 hour(s))  URINALYSIS, ROUTINE W REFLEX MICROSCOPIC     Status:  Abnormal   Collection Time    07/12/12 11:50 AM      Result Value Range   Color, Urine YELLOW  YELLOW   APPearance CLEAR  CLEAR   Specific Gravity, Urine 1.020  1.005 - 1.030   pH 7.5  5.0 - 8.0   Glucose, UA NEGATIVE  NEGATIVE mg/dL   Hgb urine dipstick TRACE (*) NEGATIVE   Bilirubin Urine NEGATIVE  NEGATIVE   Ketones, ur NEGATIVE  NEGATIVE mg/dL   Protein, ur NEGATIVE  NEGATIVE mg/dL   Urobilinogen, UA 0.2  0.0 - 1.0 mg/dL   Nitrite NEGATIVE  NEGATIVE   Leukocytes, UA NEGATIVE  NEGATIVE  URINE MICROSCOPIC-ADD ON     Status: Abnormal   Collection Time    07/12/12 11:50 AM      Result Value Range   Squamous Epithelial / LPF MANY (*) RARE   WBC, UA 7-10  <3 WBC/hpf   Bacteria, UA MANY (*) RARE  PREGNANCY, URINE     Status: None   Collection Time    07/12/12 12:26 PM      Result Value Range   Preg Test, Ur NEGATIVE  NEGATIVE  CBC WITH DIFFERENTIAL     Status: Abnormal   Collection Time    07/12/12 12:55 PM      Result Value Range   WBC 7.6  4.0 - 10.5 K/uL   RBC 3.88  3.87 - 5.11 MIL/uL   Hemoglobin 12.2  12.0 - 15.0 g/dL   HCT 47.8  29.5 - 62.1 %   MCV 94.8  78.0 - 100.0 fL   MCH 31.4  26.0 - 34.0 pg   MCHC 33.2  30.0 - 36.0 g/dL   RDW 30.8  65.7 - 84.6 %   Platelets 295  150 - 400 K/uL   Neutrophils Relative % 42 (*) 43 - 77 %   Neutro Abs 3.2  1.7 - 7.7 K/uL   Lymphocytes Relative 52 (*) 12 - 46 %   Lymphs Abs 4.0  0.7 - 4.0 K/uL   Monocytes Relative 5  3 - 12 %   Monocytes Absolute 0.4  0.1 - 1.0 K/uL   Eosinophils Relative 1  0 - 5 %   Eosinophils Absolute 0.1  0.0 - 0.7 K/uL   Basophils Relative 0  0 - 1 %   Basophils Absolute 0.0  0.0 - 0.1 K/uL  COMPREHENSIVE METABOLIC PANEL     Status: None   Collection Time    07/12/12 12:55 PM      Result Value Range   Sodium 136  135 - 145 mEq/L   Potassium 3.9  3.5 - 5.1 mEq/L   Chloride 100  96 - 112 mEq/L   CO2 29  19 - 32 mEq/L   Glucose, Bld 89  70 - 99 mg/dL   BUN 6  6 - 23 mg/dL   Creatinine, Ser 4.01   0.50 - 1.10 mg/dL   Calcium 9.3  8.4 - 02.7 mg/dL   Total Protein 7.1  6.0 - 8.3 g/dL   Albumin 3.9  3.5 - 5.2 g/dL   AST 22  0 - 37 U/L   ALT 17  0 - 35 U/L   Alkaline Phosphatase 117  39 - 117 U/L   Total Bilirubin 0.3  0.3 - 1.2 mg/dL   GFR calc non Af Amer >90  >90 mL/min   GFR calc Af Amer >90  >90 mL/min  AMYLASE     Status: None   Collection Time    07/12/12 12:55 PM      Result Value Range   Amylase 52  0 - 105 U/L    MDM  32 y.o. female with nausea, vomiting and breast tenderness. Labs are normal. Pregnancy test is negative. Patient feeling better after IV hydration, Zofran and now is eating crackers and taking PO fluids. She will make an appointment to follow up with Dr. Despina Hidden. She is stable for discharge home without any immediate complications.  I have reviewed this patient's vital signs, nurses notes, appropriate labs and discussed findings with the patient.    Medication List    TAKE these medications       promethazine 12.5 MG tablet  Commonly known as:  PHENERGAN  Take 1 tablet (12.5 mg total) by mouth every 6 (six) hours as needed for nausea.     ranitidine 150 MG tablet  Commonly known as:  ZANTAC  Take 1 tablet (150 mg total) by mouth 2 (two) times daily.      ASK your doctor about these medications       acetaminophen 500 MG tablet  Commonly known as:  TYLENOL  Take 1,000 mg by mouth every 6 (six) hours as needed for pain.     pravastatin 40 MG tablet  Commonly known as:  PRAVACHOL  Take 40 mg by mouth daily.                 [facility] Orlene Och, [place] 07/12/12 [phone]

## 2012-07-12 NOTE — ED Notes (Signed)
Pt states that she has been having nausea and vomiting for the past 3 weeks. Pt also reports bilateral breast pain x 1 week, and reports periumbilical pain.

## 2012-07-13 LAB — URINE CULTURE

## 2012-07-13 NOTE — ED Provider Notes (Signed)
Medical screening examination/treatment/procedure(s) were performed by non-physician practitioner and as supervising physician I was immediately available for consultation/collaboration.   Laray Anger, DO 07/13/12 2053

## 2012-07-16 ENCOUNTER — Ambulatory Visit: Payer: Self-pay | Admitting: Adult Health

## 2012-07-21 ENCOUNTER — Ambulatory Visit: Payer: Self-pay | Admitting: Adult Health

## 2012-07-21 ENCOUNTER — Encounter: Payer: Self-pay | Admitting: *Deleted

## 2012-07-29 ENCOUNTER — Encounter: Payer: Self-pay | Admitting: *Deleted

## 2012-08-12 ENCOUNTER — Encounter: Payer: Self-pay | Admitting: *Deleted

## 2012-08-12 ENCOUNTER — Ambulatory Visit: Payer: Self-pay | Admitting: Obstetrics & Gynecology

## 2012-08-17 ENCOUNTER — Emergency Department (HOSPITAL_COMMUNITY)
Admission: EM | Admit: 2012-08-17 | Discharge: 2012-08-18 | Disposition: A | Discharge status: Home / Self Care | Payer: Medicaid Other | Attending: Emergency Medicine | Admitting: Emergency Medicine

## 2012-08-17 ENCOUNTER — Encounter (HOSPITAL_COMMUNITY): Payer: Self-pay | Admitting: Emergency Medicine

## 2012-08-17 DIAGNOSIS — Z79899 Other long term (current) drug therapy: Secondary | ICD-10-CM | POA: Insufficient documentation

## 2012-08-17 DIAGNOSIS — R109 Unspecified abdominal pain: Secondary | ICD-10-CM | POA: Insufficient documentation

## 2012-08-17 DIAGNOSIS — J45909 Unspecified asthma, uncomplicated: Secondary | ICD-10-CM | POA: Insufficient documentation

## 2012-08-17 DIAGNOSIS — Z8659 Personal history of other mental and behavioral disorders: Secondary | ICD-10-CM | POA: Insufficient documentation

## 2012-08-17 DIAGNOSIS — R300 Dysuria: Secondary | ICD-10-CM | POA: Insufficient documentation

## 2012-08-17 DIAGNOSIS — E785 Hyperlipidemia, unspecified: Secondary | ICD-10-CM | POA: Insufficient documentation

## 2012-08-17 DIAGNOSIS — E7800 Pure hypercholesterolemia, unspecified: Secondary | ICD-10-CM | POA: Insufficient documentation

## 2012-08-17 DIAGNOSIS — N644 Mastodynia: Secondary | ICD-10-CM | POA: Insufficient documentation

## 2012-08-17 DIAGNOSIS — Z880 Allergy status to penicillin: Secondary | ICD-10-CM | POA: Insufficient documentation

## 2012-08-17 DIAGNOSIS — Z3202 Encounter for pregnancy test, result negative: Secondary | ICD-10-CM | POA: Insufficient documentation

## 2012-08-17 MED ORDER — IBUPROFEN 800 MG PO TABS
800.0000 mg | ORAL_TABLET | Freq: Once | ORAL | Status: AC
  Administered 2012-08-18: 800 mg via ORAL
  Filled 2012-08-17: qty 1

## 2012-08-17 NOTE — ED Provider Notes (Signed)
History  This chart was scribed for [employer]. Colon Branch, MD by Manuela Schwartz, ED scribe. This patient was seen in room APA04/APA04 and the patient's care was started at 2226.  CSN: [id number] Arrival date & time 08/17/12  2226  First MD Initiated Contact with Patient 08/17/12 2303     Chief Complaint  Patient presents with  . Breast Pain  . Abdominal Pain   Patient is a 32 y.o. female presenting with abdominal pain. The history is provided by the patient. No language interpreter was used.  Abdominal Pain This is a new problem. The current episode started more than 2 days ago. The problem occurs constantly. The problem has been gradually worsening. Associated symptoms include abdominal pain (lower abdominal pain). Pertinent negatives include no chest pain and no shortness of breath. The symptoms are aggravated by walking. Nothing relieves the symptoms. She has tried nothing for the symptoms.   HPI Comments: Breanna Malone is a 32 y.o. female who presents to the Emergency Department complaining of constant, lower abdominal pain onset 6 days ago and feels like she has a knot in her abdomen when she walks. She states her lat BM was yesterday but denies constipation. Her LNMP was June 10th. She also states that she thinks she has a UTI b/c she has dysuria.    Past Medical History  Diagnosis Date  . Asthma   . Bronchitis   . High cholesterol   . Abnormal Pap smear   . Elevated cholesterol   . Hyperlipidemia   . Hyperlipoproteinemia   . Chest pain   . Bipolar 1 disorder    Past Surgical History  Procedure Laterality Date  . Hernia repair     Family History  Problem Relation Age of Onset  . Asthma Other   . Bronchitis Other    History  Substance Use Topics  . Smoking status: Never Smoker   . Smokeless tobacco: Not on file  . Alcohol Use: No   OB History   Grav Para Term Preterm Abortions TAB SAB Ect Mult Living   6 5 5  1  1   3      Review of Systems  Constitutional: Negative  for fever and chills.  HENT: Negative for congestion and rhinorrhea.   Respiratory: Negative for shortness of breath.   Cardiovascular: Negative for chest pain.  Gastrointestinal: Positive for abdominal pain (lower abdominal pain). Negative for nausea, vomiting and diarrhea.  Genitourinary: Positive for dysuria.  Musculoskeletal: Negative for back pain.  Skin: Negative for color change and pallor.  Neurological: Negative for syncope and weakness.  All other systems reviewed and are negative.  A complete 10 system review of systems was obtained and all systems are negative except as noted in the HPI and PMH.    Allergies  Darvocet; Hydrocodone-acetaminophen; Penicillins; and Percocet  Home Medications   Current Outpatient Rx  Name  Route  Sig  Dispense  Refill  . loratadine (CLARITIN) 10 MG tablet   Oral   Take 10 mg by mouth daily.         . naproxen (NAPROSYN) 500 MG tablet   Oral   Take 500 mg by mouth daily as needed (for pain).         . pravastatin (PRAVACHOL) 40 MG tablet   Oral   Take 40 mg by mouth daily.           Marland Kitchen acetaminophen (TYLENOL) 500 MG tablet   Oral   Take 1,000 mg by  mouth every 6 (six) hours as needed for pain.           Triage Vitals: BP 150/84  Pulse 68  Temp(Src) 98.5 F (36.9 C) (Oral)  Resp 20  Ht 5\' 4"  (1.626 m)  Wt 215 lb 12.8 oz (97.886 kg)  BMI 37.02 kg/m2  SpO2 100%  LMP 07/24/2012 Physical Exam  Nursing note and vitals reviewed. Constitutional: She is oriented to person, place, and time. She appears well-developed and well-nourished. No distress.  HENT:  Head: Normocephalic and atraumatic.  Eyes: EOM are normal.  Neck: Neck supple. No tracheal deviation present.  Cardiovascular: Normal rate, regular rhythm and normal heart sounds.   No murmur heard. Pulmonary/Chest: Effort normal and breath sounds normal. No respiratory distress. She has no wheezes.  Abdominal: Soft. Bowel sounds are normal. There is no tenderness.   Musculoskeletal: Normal range of motion.  Neurological: She is alert and oriented to person, place, and time.  Skin: Skin is warm and dry.  Psychiatric: She has a normal mood and affect. Her behavior is normal.    ED Course  Procedures (including critical care time) Results for orders placed during the hospital encounter of 08/17/12  URINALYSIS, ROUTINE W REFLEX MICROSCOPIC      Result Value Range   Color, Urine YELLOW  YELLOW   APPearance CLEAR  CLEAR   Specific Gravity, Urine >1.030 (*) 1.005 - 1.030   pH 6.0  5.0 - 8.0   Glucose, UA NEGATIVE  NEGATIVE mg/dL   Hgb urine dipstick SMALL (*) NEGATIVE   Bilirubin Urine NEGATIVE  NEGATIVE   Ketones, ur NEGATIVE  NEGATIVE mg/dL   Protein, ur 30 (*) NEGATIVE mg/dL   Urobilinogen, UA 0.2  0.0 - 1.0 mg/dL   Nitrite NEGATIVE  NEGATIVE   Leukocytes, UA NEGATIVE  NEGATIVE  PREGNANCY, URINE      Result Value Range   Preg Test, Ur NEGATIVE  NEGATIVE  URINE MICROSCOPIC-ADD ON      Result Value Range   Squamous Epithelial / LPF MANY (*) RARE   WBC, UA 3-6  <3 WBC/hpf   RBC / HPF 0-2  <3 RBC/hpf   Bacteria, UA MANY (*) RARE   Urine-Other CLUE CELLS PRESENT     Dg Abd Acute W/chest  08/18/2012   *RADIOLOGY REPORT*  Clinical Data: Abdominal pain.  Nausea.  ACUTE ABDOMEN SERIES (ABDOMEN 2 VIEW & CHEST 1 VIEW)  Comparison: PA and lateral chest 05/07/2012 and chest and two views abdomen 09/29/2009 PA and lateral chest 04/01/2006.  Findings: Single view of the chest demonstrates clear lungs and normal heart size.  No pneumothorax or pleural fluid.  Expansile lesion in the posterior arc of the left  ninth rib is stable in appearance back to the 2008 exam.  Two views of the abdomen show no free intraperitoneal air.  The bowel gas pattern is normal without evidence of obstruction.  No abnormal abdominal calcification is seen.  IMPRESSION: No acute finding.   Original Report Authenticated By: Holley Dexter, M.D.    DIAGNOSTIC STUDIES: Oxygen  Saturation is 100% on room air, normal by my interpretation.    COORDINATION OF CARE: At 1120 PM Discussed treatment plan with patient which includes UA. Patient agrees.      MDM  Patient presents with abdominal pain to the lower abdomen. UA is unremarkable except presence of clue cells. Will treat with metronidazole. Reviewed results with the patient. Pt stable in ED with no significant deterioration in condition.The patient appears reasonably screened  and/or stabilized for discharge and I doubt any other medical condition or other [facility] requiring further screening, evaluation, or treatment in the ED at this time prior to discharge.  I personally performed the services described in this documentation, which was scribed in my presence. The recorded information has been reviewed and considered.   MDM Reviewed: nursing note and vitals Interpretation: labs and x-ray      Nicoletta Dress. Colon Branch, MD 08/18/12 [phone]

## 2012-08-17 NOTE — ED Notes (Signed)
Patient complaining of bilateral breasts pain and lower abdominal pain. Reports feels as if there's a knot on the inside. States her symptoms started after having intercourse on Tuesday.

## 2012-08-18 ENCOUNTER — Emergency Department (HOSPITAL_COMMUNITY): Payer: Medicaid Other

## 2012-08-18 LAB — URINALYSIS, ROUTINE W REFLEX MICROSCOPIC
Glucose, UA: NEGATIVE mg/dL
Leukocytes, UA: NEGATIVE
Nitrite: NEGATIVE
Protein, ur: 30 mg/dL — AB
pH: 6 (ref 5.0–8.0)

## 2012-08-18 LAB — URINE MICROSCOPIC-ADD ON

## 2012-08-18 LAB — PREGNANCY, URINE: Preg Test, Ur: NEGATIVE

## 2012-08-18 MED ORDER — METRONIDAZOLE 500 MG PO TABS: 500.0000 mg | ORAL_TABLET | Freq: Two times a day (BID) | ORAL | Status: DC

## 2012-08-19 LAB — URINE CULTURE: Colony Count: 50000

## 2012-08-26 ENCOUNTER — Ambulatory Visit (INDEPENDENT_AMBULATORY_CARE_PROVIDER_SITE_OTHER): Payer: Medicaid Other | Admitting: Obstetrics & Gynecology

## 2012-08-26 ENCOUNTER — Encounter: Payer: Self-pay | Admitting: Obstetrics & Gynecology

## 2012-08-26 VITALS — BP 118/80 | Wt 213.0 lb

## 2012-08-26 DIAGNOSIS — R102 Pelvic and perineal pain: Secondary | ICD-10-CM

## 2012-08-26 DIAGNOSIS — N949 Unspecified condition associated with female genital organs and menstrual cycle: Secondary | ICD-10-CM

## 2012-08-26 NOTE — Progress Notes (Signed)
Patient ID: Breanna Malone, female   DOB: March 13, 1980, 32 y.o.   MRN: [id number] Referral from ER Had rough sex and they referred for follow up  No complaints now Exam normal  Follow up prn

## 2012-09-09 DIAGNOSIS — Z3202 Encounter for pregnancy test, result negative: Secondary | ICD-10-CM | POA: Insufficient documentation

## 2012-09-09 DIAGNOSIS — E7800 Pure hypercholesterolemia, unspecified: Secondary | ICD-10-CM | POA: Insufficient documentation

## 2012-09-09 DIAGNOSIS — R110 Nausea: Secondary | ICD-10-CM | POA: Insufficient documentation

## 2012-09-09 DIAGNOSIS — R109 Unspecified abdominal pain: Secondary | ICD-10-CM | POA: Insufficient documentation

## 2012-09-09 DIAGNOSIS — F319 Bipolar disorder, unspecified: Secondary | ICD-10-CM | POA: Insufficient documentation

## 2012-09-09 DIAGNOSIS — J45909 Unspecified asthma, uncomplicated: Secondary | ICD-10-CM | POA: Insufficient documentation

## 2012-09-09 DIAGNOSIS — Z79899 Other long term (current) drug therapy: Secondary | ICD-10-CM | POA: Insufficient documentation

## 2012-09-09 DIAGNOSIS — Z791 Long term (current) use of non-steroidal anti-inflammatories (NSAID): Secondary | ICD-10-CM | POA: Insufficient documentation

## 2012-09-09 DIAGNOSIS — E785 Hyperlipidemia, unspecified: Secondary | ICD-10-CM | POA: Insufficient documentation

## 2012-09-10 ENCOUNTER — Encounter (HOSPITAL_COMMUNITY): Payer: Self-pay

## 2012-09-10 ENCOUNTER — Emergency Department (HOSPITAL_COMMUNITY)
Admission: EM | Admit: 2012-09-10 | Discharge: 2012-09-10 | Disposition: A | Discharge status: Home / Self Care | Payer: Medicaid Other | Attending: Emergency Medicine | Admitting: Emergency Medicine

## 2012-09-10 DIAGNOSIS — R109 Unspecified abdominal pain: Secondary | ICD-10-CM

## 2012-09-10 DIAGNOSIS — R110 Nausea: Secondary | ICD-10-CM

## 2012-09-10 LAB — URINALYSIS, ROUTINE W REFLEX MICROSCOPIC
Bilirubin Urine: NEGATIVE
Ketones, ur: NEGATIVE mg/dL
Leukocytes, UA: NEGATIVE
Protein, ur: NEGATIVE mg/dL
Urobilinogen, UA: 0.2 mg/dL (ref 0.0–1.0)

## 2012-09-10 LAB — URINE MICROSCOPIC-ADD ON

## 2012-09-10 MED ORDER — ONDANSETRON 4 MG PO TBDP: 4.0000 mg | ORAL_TABLET | Freq: Three times a day (TID) | ORAL | Status: DC | PRN

## 2012-09-10 MED ORDER — ONDANSETRON 4 MG PO TBDP
4.0000 mg | ORAL_TABLET | Freq: Once | ORAL | Status: AC
  Administered 2012-09-10: 4 mg via ORAL
  Filled 2012-09-10: qty 1

## 2012-09-10 NOTE — ED Notes (Signed)
Pt reports abd pain/tightness for several days.  Pt states her periods have not been normal so she can't say whether she is pregnant or not

## 2012-09-10 NOTE — ED Notes (Signed)
Pt given crackers per EDP request.

## 2012-09-10 NOTE — ED Provider Notes (Signed)
History    CSN: [id number] Arrival date & time 09/09/12  2351  First MD Initiated Contact with Patient 09/10/12 0105     Chief Complaint  Patient presents with  . Abdominal Pain   (Consider location/radiation/quality/duration/timing/severity/associated sxs/prior Treatment) HPI HPI Comments: Breanna Malone is a 32 y.o. female who presents to the Emergency Department complaining of abdominal discomfort. She feels nauseated at times. It is a constant fullness in her abdomen. She denies dysuria. She does not know if she is pregnant. Her LMP was in June.  PCP Dr. Felecia Shelling  Past Medical History  Diagnosis Date  . Asthma   . Bronchitis   . High cholesterol   . Abnormal Pap smear   . Elevated cholesterol   . Hyperlipidemia   . Hyperlipoproteinemia   . Chest pain   . Bipolar 1 disorder    Past Surgical History  Procedure Laterality Date  . Hernia repair     Family History  Problem Relation Age of Onset  . Asthma Other   . Bronchitis Other    History  Substance Use Topics  . Smoking status: Never Smoker   . Smokeless tobacco: Not on file  . Alcohol Use: No   OB History   Grav Para Term Preterm Abortions TAB SAB Ect Mult Living   6 5 5  1  1   3      Review of Systems  Constitutional: Negative for fever.       10 Systems reviewed and are negative for acute change except as noted in the HPI.  HENT: Negative for congestion.   Eyes: Negative for discharge and redness.  Respiratory: Negative for cough and shortness of breath.   Cardiovascular: Negative for chest pain.  Gastrointestinal: Positive for nausea. Negative for vomiting and abdominal pain.  Musculoskeletal: Negative for back pain.  Skin: Negative for rash.  Neurological: Negative for syncope, numbness and headaches.  Psychiatric/Behavioral:       No behavior change.    Allergies  Darvocet; Hydrocodone-acetaminophen; Penicillins; and Percocet  Home Medications   Current Outpatient Rx  Name  Route  Sig   Dispense  Refill  . acetaminophen (TYLENOL) 500 MG tablet   Oral   Take 1,000 mg by mouth every 6 (six) hours as needed for pain.          . pravastatin (PRAVACHOL) 40 MG tablet   Oral   Take 40 mg by mouth daily.           Marland Kitchen loratadine (CLARITIN) 10 MG tablet   Oral   Take 10 mg by mouth daily.         . metroNIDAZOLE (FLAGYL) 500 MG tablet   Oral   Take 1 tablet (500 mg total) by mouth 2 (two) times daily.   14 tablet   0   . naproxen (NAPROSYN) 500 MG tablet   Oral   Take 500 mg by mouth daily as needed (for pain).          BP 127/82  Pulse 94  Temp(Src) 98.3 F (36.8 C) (Oral)  Resp 16  Ht 5\' 4"  (1.626 m)  Wt 212 lb (96.163 kg)  BMI 36.37 kg/m2  SpO2 100%  LMP 08/20/2012 Physical Exam  Nursing note and vitals reviewed. Constitutional:  Awake, alert, nontoxic appearance.  HENT:  Head: Normocephalic and atraumatic.  Eyes: EOM are normal. Pupils are equal, round, and reactive to light.  Neck: Neck supple.  Cardiovascular: Normal rate and intact distal pulses.  Pulmonary/Chest: Effort normal. She exhibits no tenderness.  Abdominal: Soft. There is no tenderness. There is no rebound.  Musculoskeletal: She exhibits no tenderness.  Baseline ROM, no obvious new focal weakness.  Neurological:  Mental status and motor strength appears baseline for patient and situation.  Skin: No rash noted.  Psychiatric: She has a normal mood and affect.    ED Course  Procedures (including critical care time) Results for orders placed during the hospital encounter of 09/10/12  PREGNANCY, URINE      Result Value Range   Preg Test, Ur NEGATIVE  NEGATIVE  URINALYSIS, ROUTINE W REFLEX MICROSCOPIC      Result Value Range   Color, Urine YELLOW  YELLOW   APPearance CLEAR  CLEAR   Specific Gravity, Urine 1.025  1.005 - 1.030   pH 6.5  5.0 - 8.0   Glucose, UA NEGATIVE  NEGATIVE mg/dL   Hgb urine dipstick SMALL (*) NEGATIVE   Bilirubin Urine NEGATIVE  NEGATIVE   Ketones,  ur NEGATIVE  NEGATIVE mg/dL   Protein, ur NEGATIVE  NEGATIVE mg/dL   Urobilinogen, UA 0.2  0.0 - 1.0 mg/dL   Nitrite NEGATIVE  NEGATIVE   Leukocytes, UA NEGATIVE  NEGATIVE  URINE MICROSCOPIC-ADD ON      Result Value Range   Squamous Epithelial / LPF MANY (*) RARE   WBC, UA 0-2  <3 WBC/hpf   RBC / HPF 3-6  <3 RBC/hpf   Bacteria, UA FEW (*) RARE   Urine-Other MUCOUS PRESENT       MDM  Patient presents with abdominal discomfort and nausea. She is not pregnant. Reviewed results with the patient. She was given zofran which helped the nausea. Pt stable in ED with no significant deterioration in condition.The patient appears reasonably screened and/or stabilized for discharge and I doubt any other medical condition or other [facility] requiring further screening, evaluation, or treatment in the ED at this time prior to discharge.  MDM Reviewed: nursing note and vitals Interpretation: labs     Nicoletta Dress. Colon Branch, MD 09/10/12 [phone]

## 2012-10-03 ENCOUNTER — Ambulatory Visit: Payer: Medicaid Other | Admitting: Obstetrics & Gynecology

## 2012-10-03 ENCOUNTER — Encounter: Payer: Self-pay | Admitting: *Deleted

## 2012-10-06 ENCOUNTER — Emergency Department (HOSPITAL_COMMUNITY): Payer: Medicaid Other

## 2012-10-06 ENCOUNTER — Emergency Department (HOSPITAL_COMMUNITY)
Admission: EM | Admit: 2012-10-06 | Discharge: 2012-10-06 | Disposition: A | Discharge status: Home / Self Care | Payer: Medicaid Other | Attending: Emergency Medicine | Admitting: Emergency Medicine

## 2012-10-06 DIAGNOSIS — R42 Dizziness and giddiness: Secondary | ICD-10-CM | POA: Insufficient documentation

## 2012-10-06 DIAGNOSIS — Z8659 Personal history of other mental and behavioral disorders: Secondary | ICD-10-CM | POA: Insufficient documentation

## 2012-10-06 DIAGNOSIS — J45909 Unspecified asthma, uncomplicated: Secondary | ICD-10-CM | POA: Insufficient documentation

## 2012-10-06 DIAGNOSIS — Z880 Allergy status to penicillin: Secondary | ICD-10-CM | POA: Insufficient documentation

## 2012-10-06 DIAGNOSIS — N83209 Unspecified ovarian cyst, unspecified side: Secondary | ICD-10-CM | POA: Insufficient documentation

## 2012-10-06 DIAGNOSIS — Z91040 Latex allergy status: Secondary | ICD-10-CM | POA: Insufficient documentation

## 2012-10-06 DIAGNOSIS — Z862 Personal history of diseases of the blood and blood-forming organs and certain disorders involving the immune mechanism: Secondary | ICD-10-CM | POA: Insufficient documentation

## 2012-10-06 DIAGNOSIS — Z79899 Other long term (current) drug therapy: Secondary | ICD-10-CM | POA: Insufficient documentation

## 2012-10-06 DIAGNOSIS — R110 Nausea: Secondary | ICD-10-CM | POA: Insufficient documentation

## 2012-10-06 DIAGNOSIS — Z8639 Personal history of other endocrine, nutritional and metabolic disease: Secondary | ICD-10-CM | POA: Insufficient documentation

## 2012-10-06 DIAGNOSIS — R5381 Other malaise: Secondary | ICD-10-CM | POA: Insufficient documentation

## 2012-10-06 DIAGNOSIS — Z3202 Encounter for pregnancy test, result negative: Secondary | ICD-10-CM | POA: Insufficient documentation

## 2012-10-06 DIAGNOSIS — Z8709 Personal history of other diseases of the respiratory system: Secondary | ICD-10-CM | POA: Insufficient documentation

## 2012-10-06 DIAGNOSIS — N926 Irregular menstruation, unspecified: Secondary | ICD-10-CM | POA: Insufficient documentation

## 2012-10-06 DIAGNOSIS — N760 Acute vaginitis: Secondary | ICD-10-CM | POA: Insufficient documentation

## 2012-10-06 DIAGNOSIS — R109 Unspecified abdominal pain: Secondary | ICD-10-CM | POA: Insufficient documentation

## 2012-10-06 LAB — URINALYSIS, ROUTINE W REFLEX MICROSCOPIC
Leukocytes, UA: NEGATIVE
Nitrite: NEGATIVE
Specific Gravity, Urine: 1.02 (ref 1.005–1.030)
Urobilinogen, UA: 0.2 mg/dL (ref 0.0–1.0)
pH: 7 (ref 5.0–8.0)

## 2012-10-06 LAB — URINE MICROSCOPIC-ADD ON

## 2012-10-06 LAB — CBC WITH DIFFERENTIAL/PLATELET
Basophils Absolute: 0 10*3/uL (ref 0.0–0.1)
Eosinophils Absolute: 0.1 10*3/uL (ref 0.0–0.7)
Eosinophils Relative: 1 % (ref 0–5)
Lymphocytes Relative: 44 % (ref 12–46)
Lymphs Abs: 3.2 10*3/uL (ref 0.7–4.0)
Neutrophils Relative %: 49 % (ref 43–77)
Platelets: 297 10*3/uL (ref 150–400)
RBC: 3.97 MIL/uL (ref 3.87–5.11)
RDW: 11.6 % (ref 11.5–15.5)
WBC: 7.2 10*3/uL (ref 4.0–10.5)

## 2012-10-06 LAB — PREGNANCY, URINE: Preg Test, Ur: NEGATIVE

## 2012-10-06 LAB — BASIC METABOLIC PANEL
CO2: 29 mEq/L (ref 19–32)
Calcium: 9.6 mg/dL (ref 8.4–10.5)
GFR calc non Af Amer: >90 mL/min (ref >90)
Glucose, Bld: 97 mg/dL (ref 70–99)
Potassium: 3.5 mEq/L (ref 3.5–5.1)
Sodium: 139 mEq/L (ref 135–145)

## 2012-10-06 MED ORDER — METRONIDAZOLE 500 MG PO TABS: 500.0000 mg | ORAL_TABLET | Freq: Two times a day (BID) | ORAL | Status: DC

## 2012-10-06 NOTE — ED Provider Notes (Signed)
CSN: [id number]     Arrival date & time 10/06/12  1131 History  This chart was scribed for Flint Melter, MD by Leone Payor, ED Scribe. This patient was seen in room APA06/APA06 and the patient's care was started 12:08 PM.   Chief Complaint  Patient presents with  . Dysmenorrhea  . Abdominal Pain    The history is provided by the patient. No language interpreter was used.   HPI Comments: Breanna Malone is a 32 y.o. female who presents to the Emergency Department complaining of irregular menstrual cycle for 1 month. Pt states she had 2 periods last month; the first from 08/19/12-08/23/12 and the second from 09/14/12-09/22/12. States she has had intercourse twice since the end of her second period and reports feeling fatigued, frustrated, and nauseated. Pt also complains of constant abdominal pain for the last couple of days. She states having minimal dizziness at times. She denies having regularly protected sex. She denies dysuria, frequency.    Past Medical History  Diagnosis Date  . Asthma   . Bronchitis   . High cholesterol   . Abnormal Pap smear   . Elevated cholesterol   . Hyperlipidemia   . Hyperlipoproteinemia   . Chest pain   . Bipolar 1 disorder    Past Surgical History  Procedure Laterality Date  . Hernia repair     Family History  Problem Relation Age of Onset  . Asthma Other   . Bronchitis Other    History  Substance Use Topics  . Smoking status: Never Smoker   . Smokeless tobacco: Not on file  . Alcohol Use: No   OB History   Grav Para Term Preterm Abortions TAB SAB Ect Mult Living   6 5 5  1  1   3      Review of Systems  Constitutional: Positive for fatigue.  Gastrointestinal: Positive for nausea and abdominal pain. Negative for vomiting.  Genitourinary: Positive for menstrual problem. Negative for dysuria and frequency.  Neurological: Positive for dizziness.  All other systems reviewed and are negative.    Allergies  Darvocet;  Hydrocodone-acetaminophen; Penicillins; Latex; and Percocet  Home Medications   Current Outpatient Rx  Name  Route  Sig  Dispense  Refill  . acetaminophen (TYLENOL) 500 MG tablet   Oral   Take 1,000 mg by mouth every 6 (six) hours as needed for pain.          . metroNIDAZOLE (FLAGYL) 500 MG tablet   Oral   Take 1 tablet (500 mg total) by mouth 2 (two) times daily. One po bid x 7 days   14 tablet   0    BP 133/79  Pulse 79  Temp(Src) 98.3 F (36.8 C) (Oral)  Resp 20  Wt 212 lb (96.163 kg)  BMI 36.37 kg/m2  SpO2 100%  LMP 09/22/2012 Physical Exam  Nursing note and vitals reviewed. Constitutional: She is oriented to person, place, and time. She appears well-developed and well-nourished.  HENT:  Head: Normocephalic and atraumatic.  Eyes: Conjunctivae and EOM are normal. Pupils are equal, round, and reactive to light.  Neck: Normal range of motion and phonation normal. Neck supple.  Cardiovascular: Normal rate, regular rhythm and intact distal pulses.   Pulmonary/Chest: Effort normal and breath sounds normal. She exhibits no tenderness.  Abdominal: Soft. She exhibits no distension. There is tenderness. There is no guarding.  Diffusely tender throughout abdomen.   Genitourinary:  Normal external female genitalia. Small amount of white, opaque discharge  emanating from the cervical os. No cervical motion tenderness. On bimanual examination there is diffuse pelvic tenderness without palpable enlargement of uterus, ovaries, or double adnexal mass  Musculoskeletal: Normal range of motion.  Neurological: She is alert and oriented to person, place, and time. She has normal strength. She exhibits normal muscle tone.  Skin: Skin is warm and dry.  Psychiatric: She has a normal mood and affect. Her behavior is normal. Judgment and thought content normal.    ED Course   Procedures (including critical care time)  DIAGNOSTIC STUDIES: Oxygen Saturation is 100% on RA, normal by my  interpretation.    COORDINATION OF CARE: 12:26 PM Discussed treatment plan with pt at bedside and pt agreed to plan.   Labs Reviewed  WET PREP, GENITAL - Abnormal; Notable for the following:    Clue Cells Wet Prep HPF POC MANY (*)    WBC, Wet Prep HPF POC RARE (*)    All other components within normal limits  URINALYSIS, ROUTINE W REFLEX MICROSCOPIC - Abnormal; Notable for the following:    Hgb urine dipstick TRACE (*)    All other components within normal limits  URINE MICROSCOPIC-ADD ON - Abnormal; Notable for the following:    Squamous Epithelial / LPF FEW (*)    Bacteria, UA FEW (*)    All other components within normal limits  URINE CULTURE  GC/CHLAMYDIA PROBE AMP  PREGNANCY, URINE  CBC WITH DIFFERENTIAL  BASIC METABOLIC PANEL  RPR  HIV ANTIBODY (ROUTINE TESTING)   US Transvaginal Non-ob  10/06/2012   *RADIOLOGY REPORT*  Clinical Data: Pelvic pain  TRANSABDOMINAL AND TRANSVAGINAL ULTRASOUND OF PELVIS Technique:  Both transabdominal and transvaginal ultrasound examinations of the pelvis were performed. Transabdominal technique was performed for global imaging of the pelvis including uterus, ovaries, adnexal regions, and pelvic cul-de-sac.  It was necessary to proceed with endovaginal exam following the transabdominal exam to visualize the ovaries.  Comparison:  No comparison studies available.  Findings:  Uterus: 8.2 x 4.8 x 6.0 cm.  Myometrium is homogeneous.  No evidence for fibroids.  Endometrium: 9 mm in double layer thickness.  Right ovary:  4.2 x 4.5 x 3.8 cm.  Multiple follicles noted. 3.0 x 2.1 x 2.1 cm complex cystic lesion is identified within the parenchyma.  Left ovary: 3.1 x 1.4 x 2.5 cm.  Sonographically normal.  Other findings: Trace simple appearing free fluid identified.  IMPRESSION: 3.0 cm complex cyst identified in the right ovary.  This is probably hemorrhagic cyst.  Follow up ultrasound of the pelvis and 6 weeks is recommended to ensure resolution.  Otherwise  unremarkable study.   Original Report Authenticated By: Kennith Center, M.D.   US Pelvis Complete  10/06/2012   *RADIOLOGY REPORT*  Clinical Data: Pelvic pain  TRANSABDOMINAL AND TRANSVAGINAL ULTRASOUND OF PELVIS Technique:  Both transabdominal and transvaginal ultrasound examinations of the pelvis were performed. Transabdominal technique was performed for global imaging of the pelvis including uterus, ovaries, adnexal regions, and pelvic cul-de-sac.  It was necessary to proceed with endovaginal exam following the transabdominal exam to visualize the ovaries.  Comparison:  No comparison studies available.  Findings:  Uterus: 8.2 x 4.8 x 6.0 cm.  Myometrium is homogeneous.  No evidence for fibroids.  Endometrium: 9 mm in double layer thickness.  Right ovary:  4.2 x 4.5 x 3.8 cm.  Multiple follicles noted. 3.0 x 2.1 x 2.1 cm complex cystic lesion is identified within the parenchyma.  Left ovary: 3.1 x 1.4 x 2.5 cm.  Sonographically normal.  Other findings: Trace simple appearing free fluid identified.  IMPRESSION: 3.0 cm complex cyst identified in the right ovary.  This is probably hemorrhagic cyst.  Follow up ultrasound of the pelvis and 6 weeks is recommended to ensure resolution.  Otherwise unremarkable study.   Original Report Authenticated By: Kennith Center, M.D.   1. Irregular menses   2. Nonspecific vaginitis     MDM  Irregular menses with Ovarian cyst. Incidental Nonspecific vaginitis. Doubt metabolic instability, serious bacterial infection or impending vascular collapse; the patient is stable for discharge.  Nursing Notes Reviewed/ Care Coordinated Applicable Imaging Reviewed Interpretation of Laboratory Data incorporated into ED treatment  Plan: Home Medications- Flagyl; Home Treatments- Rest, watch for progressive sx.; return here if the recommended treatment, does not improve the symptoms; Recommended follow up-  GYN in 1 week  I personally performed the services described in this  documentation, which was scribed in my presence. The recorded information has been reviewed and is accurate.    Flint Melter, MD 10/06/12 2020

## 2012-10-06 NOTE — ED Notes (Signed)
States that she had 2 periods last month and is having breast tenderness, nausea, vomiting.

## 2012-10-07 LAB — GC/CHLAMYDIA PROBE AMP
CT Probe RNA: NEGATIVE
GC Probe RNA: NEGATIVE

## 2012-10-08 LAB — URINE CULTURE: Colony Count: 60000

## 2012-10-09 NOTE — ED Notes (Addendum)
+   Urine No further treatment per Junius Finner

## 2012-10-10 ENCOUNTER — Encounter (HOSPITAL_COMMUNITY): Payer: Self-pay | Admitting: Emergency Medicine

## 2012-10-31 ENCOUNTER — Encounter: Payer: Self-pay | Admitting: Adult Health

## 2012-10-31 ENCOUNTER — Ambulatory Visit (INDEPENDENT_AMBULATORY_CARE_PROVIDER_SITE_OTHER): Payer: Medicaid Other | Admitting: Adult Health

## 2012-10-31 VITALS — BP 120/80 | Ht 64.0 in | Wt 215.0 lb

## 2012-10-31 DIAGNOSIS — N83209 Unspecified ovarian cyst, unspecified side: Secondary | ICD-10-CM

## 2012-10-31 DIAGNOSIS — F319 Bipolar disorder, unspecified: Secondary | ICD-10-CM

## 2012-10-31 DIAGNOSIS — N83201 Unspecified ovarian cyst, right side: Secondary | ICD-10-CM

## 2012-10-31 HISTORY — DX: Unspecified ovarian cyst, right side: N83.201

## 2012-10-31 NOTE — Progress Notes (Signed)
Subjective:     Patient ID: Breanna Malone, female   DOB: September 09, 1980, 32 y.o.   MRN: [id number]  HPI Breanna Malone is a 32 year old black female here in follow up of ER visit 8/18 for bleeding and she was found to have a right ovarian complex cyst that needs follow up US.Her case worker Ether Griffins is with her and has concerns that she is hyper-sexual.She is bipolar and not on meds at present. Had negative GC/CHL in Er  Review of Systems Positives in HPI Reviewed past medical,surgical, social and family history. Reviewed medications and allergies.     Objective:   Physical Exam BP 120/80  Ht 5\' 4"  (1.626 m)  Wt 215 lb (97.523 kg)  BMI 36.89 kg/m2  LMP 10/15/2012   Discussed cyst and ovulation with pt and case worker, and discussed that birth control(discussed depo, nexplanon and IUD would be good, because she does not have to remember to take) will help suppress ovulation and increased desires but needs to see psychiatrist regarding her bipolar.will get follow up US next week to assess cyst. Assessment:     Right ovarian cyst Bipolar     Plan:     Review hand out on contraception options will discuss at [place] return in 1 week for [place] and see me Try lamb skin condoms at least

## 2012-10-31 NOTE — Patient Instructions (Addendum)
Contraception Choices Contraception (birth control) is the use of any methods or devices to prevent pregnancy. Below are some methods to help avoid pregnancy. HORMONAL METHODS   Contraceptive implant. This is a thin, plastic tube containing progesterone hormone. It does not contain estrogen hormone. Your caregiver inserts the tube in the inner part of the upper arm. The tube can remain in place for up to 3 years. After 3 years, the implant must be removed. The implant prevents the ovaries from releasing an egg (ovulation), thickens the cervical mucus which prevents sperm from entering the uterus, and thins the lining of the inside of the uterus.  Progesterone-only injections. These injections are given every 3 months by your caregiver to prevent pregnancy. This synthetic progesterone hormone stops the ovaries from releasing eggs. It also thickens cervical mucus and changes the uterine lining. This makes it harder for sperm to survive in the uterus. Birth control pills. These pills contain estrogen and progesterone h Ovarian Cyst The ovaries are small organs that are on each side of the uterus. The ovaries are the organs that produce the female hormones, estrogen and progesterone. An ovarian cyst is a sac filled with fluid that can vary in its size. It is normal for a small cyst to form in women who are in the childbearing age and who have menstrual periods. This type of cyst is called a follicle cyst that becomes an ovulation cyst (corpus luteum cyst) after it produces the women's egg. It later goes away on its own if the woman does not become pregnant. There are other kinds of ovarian cysts that may cause problems and may need to be treated. The most serious problem is a cyst with cancer. It should be noted that menopausal women who have an ovarian cyst are at a higher risk of it being a cancer cyst. They should be evaluated very quickly, thoroughly and followed closely. This is especially true in  menopausal women because of the high rate of ovarian cancer in women in menopause. CAUSES AND TYPES OF OVARIAN CYSTS: FUNCTIONAL CYST: The follicle/corpus luteum cyst is a functional cyst that occurs every month during ovulation with the menstrual cycle. They go away with the next menstrual cycle if the woman does not get pregnant. Usually, there are no symptoms with a functional cyst. ENDOMETRIOMA CYST: This cyst develops from the lining of the uterus tissue. This cyst gets in or on the ovary. It grows every month from the bleeding during the menstrual period. It is also called a "chocolate cyst" because it becomes filled with blood that turns brown. This cyst can cause pain in the lower abdomen during intercourse and with your menstrual period. CYSTADENOMA CYST: This cyst develops from the cells on the outside of the ovary. They usually are not cancerous. They can get very big and cause lower abdomen pain and pain with intercourse. This type of cyst can twist on itself, cut off its blood supply and cause severe pain. It also can easily rupture and cause a lot of pain. DERMOID CYST: This type of cyst is sometimes found in both ovaries. They are found to have different kinds of body tissue in the cyst. The tissue includes skin, teeth, hair, and/or cartilage. They usually do not have symptoms unless they get very big. Dermoid cysts are rarely cancerous. POLYCYSTIC OVARY: This is a rare condition with hormone problems that produces many small cysts on both ovaries. The cysts are follicle-like cysts that never produce an egg and become a  corpus luteum. It can cause an increase in body weight, infertility, acne, increase in body and facial hair and lack of menstrual periods or rare menstrual periods. Many women with this problem develop type 2 diabetes. The exact cause of this problem is unknown. A polycystic ovary is rarely cancerous. THECA LUTEIN CYST: Occurs when too much hormone (human chorionic gonadotropin)  is produced and over-stimulates the ovaries to produce an egg. They are frequently seen when doctors stimulate the ovaries for invitro-fertilization (test tube babies). LUTEOMA CYST: This cyst is seen during pregnancy. Rarely it can cause an obstruction to the birth canal during labor and delivery. They usually go away after delivery. SYMPTOMS  Pelvic pain or pressure. Pain during sexual intercourse. Increasing girth (swelling) of the abdomen. Abnormal menstrual periods. Increasing pain with menstrual periods. You stop having menstrual periods and you are not pregnant. DIAGNOSIS  The diagnosis can be made during: Routine or annual pelvic examination (common). Ultrasound. X-ray of the pelvis. CT Scan. MRI. Blood tests. TREATMENT  Treatment may only be to follow the cyst monthly for 2 to 3 months with your caregiver. Many go away on their own, especially functional cysts. May be aspirated (drained) with a long needle with ultrasound, or by laparoscopy (inserting a tube into the pelvis through a small incision). The whole cyst can be removed by laparoscopy. Sometimes the cyst may need to be removed through an incision in the lower abdomen. Hormone treatment is sometimes used to help dissolve certain cysts. Birth control pills are sometimes used to help dissolve certain cysts. HOME CARE INSTRUCTIONS  Follow your caregiver's advice regarding: Medicine. Follow up visits to evaluate and treat the cyst. You may need to come back or make an appointment with another caregiver, to find the exact cause of your cyst, if your caregiver is not a gynecologist. Get your yearly and recommended pelvic examinations and Pap tests. Let your caregiver know if you have had an ovarian cyst in the past. SEEK MEDICAL CARE IF:  Your periods are late, irregular, they stop, or are painful. Your stomach (abdomen) or pelvic pain does not go away. Your stomach becomes larger or swollen. You have pressure on your  bladder or trouble emptying your bladder completely. You have painful sexual intercourse. You have feelings of fullness, pressure, or discomfort in your stomach. You lose weight for no apparent reason. You feel generally ill. You become constipated. You lose your appetite. You develop acne. You have an increase in body and facial hair. You are gaining weight, without changing your exercise and eating habits. You think you are pregnant. SEEK IMMEDIATE MEDICAL CARE IF:  You have increasing abdominal pain. You feel sick to your stomach (nausea) and/or vomit. You develop a fever that comes on suddenly. You develop abdominal pain during a bowel movement. Your menstrual periods become heavier than usual. Document Released: 02/05/2005 Document Revised: 04/30/2011 Document Reviewed: 12/09/2008 [facility] Patient Information 2014 [place], [place].  ormone. They work by stopping the egg from forming in the ovary (ovulation). Birth control pills are prescribed by a caregiver.Birth control pills can also be used to treat heavy periods.  Minipill. This type of birth control pill contains only the progesterone hormone. They are taken every day of each month and must be prescribed by your caregiver.  Birth control patch. The patch contains hormones similar to those in birth control pills. It must be changed once a week and is prescribed by a caregiver.  Vaginal ring. The ring contains hormones similar to those  in birth control pills. It is left in the vagina for 3 weeks, removed for 1 week, and then a new one is put back in place. The patient must be comfortable inserting and removing the ring from the vagina.A caregiver's prescription is necessary.  Emergency contraception. Emergency contraceptives prevent pregnancy after unprotected sexual intercourse. This pill can be taken right after sex or up to 5 days after unprotected sex. It is most effective the sooner you take the pills after having sexual  intercourse. Emergency contraceptive pills are available without a prescription. Check with your pharmacist. Do not use emergency contraception as your only form of birth control. BARRIER METHODS   Female condom. This is a thin sheath (latex or rubber) that is worn over the penis during sexual intercourse. It can be used with spermicide to increase effectiveness.  Female condom. This is a soft, loose-fitting sheath that is put into the vagina before sexual intercourse.  Diaphragm. This is a soft, latex, dome-shaped barrier that must be fitted by a caregiver. It is inserted into the vagina, along with a spermicidal jelly. It is inserted before intercourse. The diaphragm should be left in the vagina for 6 to 8 hours after intercourse.  Cervical cap. This is a round, soft, latex or plastic cup that fits over the cervix and must be fitted by a caregiver. The cap can be left in place for up to 48 hours after intercourse.  Sponge. This is a soft, circular piece of polyurethane foam. The sponge has spermicide in it. It is inserted into the vagina after wetting it and before sexual intercourse.  Spermicides. These are chemicals that kill or block sperm from entering the cervix and uterus. They come in the form of creams, jellies, suppositories, foam, or tablets. They do not require a prescription. They are inserted into the vagina with an applicator before having sexual intercourse. The process must be repeated every time you have sexual intercourse. INTRAUTERINE CONTRACEPTION  Intrauterine device (IUD). This is a T-shaped device that is put in a woman's uterus during a menstrual period to prevent pregnancy. There are 2 types:  Copper IUD. This type of IUD is wrapped in copper wire and is placed inside the uterus. Copper makes the uterus and fallopian tubes produce a fluid that kills sperm. It can stay in place for 10 years.  Hormone IUD. This type of IUD contains the hormone progestin (synthetic  progesterone). The hormone thickens the cervical mucus and prevents sperm from entering the uterus, and it also thins the uterine lining to prevent implantation of a fertilized egg. The hormone can weaken or kill the sperm that get into the uterus. It can stay in place for 5 years. PERMANENT METHODS OF CONTRACEPTION  Female tubal ligation. This is when the woman's fallopian tubes are surgically sealed, tied, or blocked to prevent the egg from traveling to the uterus.  Female sterilization. This is when the female has the tubes that carry sperm tied off (vasectomy).This blocks sperm from entering the vagina during sexual intercourse. After the procedure, the man can still ejaculate fluid (semen). NATURAL PLANNING METHODS  Natural family planning. This is not having sexual intercourse or using a barrier method (condom, diaphragm, cervical cap) on days the woman could become pregnant.  Calendar method. This is keeping track of the length of each menstrual cycle and identifying when you are fertile.  Ovulation method. This is avoiding sexual intercourse during ovulation.  Symptothermal method. This is avoiding sexual intercourse during ovulation, using a  thermometer and ovulation symptoms.  Post-ovulation method. This is timing sexual intercourse after you have ovulated. Regardless of which type or method of contraception you choose, it is important that you use condoms to protect against the transmission of sexually transmitted diseases (STDs). Talk with your caregiver about which form of contraception is most appropriate for you. Document Released: 02/05/2005 Document Revised: 04/30/2011 Document Reviewed: 06/14/2010 [facility] Patient Information 2014 [place], [place].

## 2012-10-31 NOTE — Assessment & Plan Note (Signed)
Not taking meds needs to see psychiatrist

## 2012-11-05 ENCOUNTER — Ambulatory Visit (INDEPENDENT_AMBULATORY_CARE_PROVIDER_SITE_OTHER): Payer: Medicaid Other

## 2012-11-05 ENCOUNTER — Encounter: Payer: Self-pay | Admitting: Adult Health

## 2012-11-05 ENCOUNTER — Ambulatory Visit (INDEPENDENT_AMBULATORY_CARE_PROVIDER_SITE_OTHER): Payer: Medicaid Other | Admitting: Adult Health

## 2012-11-05 ENCOUNTER — Other Ambulatory Visit: Payer: Self-pay | Admitting: Adult Health

## 2012-11-05 VITALS — BP 116/76 | Ht 64.0 in | Wt 214.0 lb

## 2012-11-05 DIAGNOSIS — N83201 Unspecified ovarian cyst, right side: Secondary | ICD-10-CM

## 2012-11-05 DIAGNOSIS — N83209 Unspecified ovarian cyst, unspecified side: Secondary | ICD-10-CM

## 2012-11-05 DIAGNOSIS — Z8742 Personal history of other diseases of the female genital tract: Secondary | ICD-10-CM

## 2012-11-05 NOTE — Progress Notes (Signed)
Subjective:     Patient ID: Breanna Malone, female   DOB: 1980/09/07, 32 y.o.   MRN: [id number]  HPI Breanna Malone is here for [place], had ovarian cyst in ER in August. Gets irritated with sex.  Review of Systems See HPI Reviewed past medical,surgical, social and family history. Reviewed medications and allergies.     Objective:   Physical Exam BP 116/76  Ht 5\' 4"  (1.626 m)  Wt 214 lb (97.07 kg)  BMI 36.72 kg/m2  LMP 10/15/2012   Reviewed [place] with pt, cyst has resolved, she needs pap, had abnormal one last year,   Assessment:     Resolved ovarian cyst    Plan:     Return in 3 weeks for pap and physical   try Surgilube as lubricate as others bother her and try lamb skin condoms

## 2012-11-05 NOTE — Patient Instructions (Addendum)
Return in 3 weeks for pap and physical Try lubricate with sex

## 2012-11-05 NOTE — Assessment & Plan Note (Signed)
Has resolved by [place] today

## 2012-11-17 ENCOUNTER — Encounter: Payer: Self-pay | Admitting: Adult Health

## 2012-11-17 ENCOUNTER — Ambulatory Visit (INDEPENDENT_AMBULATORY_CARE_PROVIDER_SITE_OTHER): Payer: Medicaid Other | Admitting: Adult Health

## 2012-11-17 VITALS — BP 120/80 | Ht 64.0 in | Wt 218.0 lb

## 2012-11-17 DIAGNOSIS — Z3200 Encounter for pregnancy test, result unknown: Secondary | ICD-10-CM

## 2012-11-17 DIAGNOSIS — Z3201 Encounter for pregnancy test, result positive: Secondary | ICD-10-CM

## 2012-11-17 LAB — POCT URINE PREGNANCY: Preg Test, Ur: POSITIVE

## 2012-11-17 NOTE — Progress Notes (Signed)
Patient ID: Breanna Malone, female   DOB: 04-Aug-1980, 32 y.o.   MRN: [id number] Pt advised to stop naproxen and pravastatin per JAG. Pt verbalized understanding.

## 2012-11-18 ENCOUNTER — Telehealth: Payer: Self-pay | Admitting: Adult Health

## 2012-11-24 ENCOUNTER — Ambulatory Visit (INDEPENDENT_AMBULATORY_CARE_PROVIDER_SITE_OTHER): Payer: Medicaid Other

## 2012-11-24 ENCOUNTER — Encounter: Payer: Self-pay | Admitting: Women's Health

## 2012-11-24 ENCOUNTER — Ambulatory Visit (INDEPENDENT_AMBULATORY_CARE_PROVIDER_SITE_OTHER): Payer: Medicaid Other | Admitting: Women's Health

## 2012-11-24 ENCOUNTER — Other Ambulatory Visit: Payer: Self-pay | Admitting: Women's Health

## 2012-11-24 VITALS — BP 120/70 | Wt 216.5 lb

## 2012-11-24 DIAGNOSIS — Z331 Pregnant state, incidental: Secondary | ICD-10-CM

## 2012-11-24 DIAGNOSIS — O09299 Supervision of pregnancy with other poor reproductive or obstetric history, unspecified trimester: Secondary | ICD-10-CM

## 2012-11-24 DIAGNOSIS — Z3481 Encounter for supervision of other normal pregnancy, first trimester: Secondary | ICD-10-CM

## 2012-11-24 DIAGNOSIS — R87619 Unspecified abnormal cytological findings in specimens from cervix uteri: Secondary | ICD-10-CM | POA: Insufficient documentation

## 2012-11-24 DIAGNOSIS — O3680X0 Pregnancy with inconclusive fetal viability, not applicable or unspecified: Secondary | ICD-10-CM

## 2012-11-24 DIAGNOSIS — O99340 Other mental disorders complicating pregnancy, unspecified trimester: Secondary | ICD-10-CM

## 2012-11-24 DIAGNOSIS — J45909 Unspecified asthma, uncomplicated: Secondary | ICD-10-CM | POA: Insufficient documentation

## 2012-11-24 DIAGNOSIS — Z3480 Encounter for supervision of other normal pregnancy, unspecified trimester: Secondary | ICD-10-CM | POA: Insufficient documentation

## 2012-11-24 DIAGNOSIS — Z1389 Encounter for screening for other disorder: Secondary | ICD-10-CM

## 2012-11-24 DIAGNOSIS — O0991 Supervision of high risk pregnancy, unspecified, first trimester: Secondary | ICD-10-CM

## 2012-11-24 DIAGNOSIS — Z87898 Personal history of other specified conditions: Secondary | ICD-10-CM

## 2012-11-24 LAB — URINALYSIS
Bilirubin Urine: NEGATIVE
Specific Gravity, Urine: 1.011 (ref 1.005–1.030)
Urobilinogen, UA: 0.2 mg/dL (ref 0.0–1.0)

## 2012-11-24 LAB — POCT URINALYSIS DIPSTICK
Glucose, UA: NEGATIVE
Nitrite, UA: NEGATIVE

## 2012-11-24 LAB — CBC
MCV: 91.8 fL (ref 78.0–100.0)
Platelets: 319 10*3/uL (ref 150–400)
RBC: 3.88 MIL/uL (ref 3.87–5.11)
WBC: 9.7 10*3/uL (ref 4.0–10.5)

## 2012-11-24 MED ORDER — PRENATAL PLUS 27-1 MG PO TABS
1.0000 | ORAL_TABLET | Freq: Every day | ORAL | Status: DC
Start: 2012-11-24 — End: 2013-05-29

## 2012-11-24 NOTE — Progress Notes (Signed)
U/S(5+5wks)-single IUP with +FCA noted, FHR-96 bpm, cx long and closed, bilateral adnexa wnl, +YS noted, C.L. Noted on Rt with no free fluid noted within pelvis

## 2012-11-24 NOTE — Telephone Encounter (Signed)
Pt had an appt today to discuss concerns with provider

## 2012-11-24 NOTE — Patient Instructions (Addendum)
Nausea & Vomiting  Have saltine crackers or pretzels by your bed and eat a few bites before you raise your head out of bed in the morning  Eat small frequent meals throughout the day instead of large meals  Drink plenty of fluids throughout the day to stay hydrated, just don't drink a lot of fluids with your meals.  This can make your stomach fill up faster making you feel sick  Do not brush your teeth right after you eat  Products with real ginger are good for nausea, like ginger ale and ginger hard candy Make sure it says made with real ginger!  Sucking on sour candy like lemon heads is also good for nausea  If your prenatal vitamins make you nauseated, take them at night so you will sleep through the nausea  If you feel like you need medicine for the nausea & vomiting please let us know  If you are unable to keep any fluids or food down please let us know    Pregnancy - First Trimester During sexual intercourse, millions of sperm go into the vagina. Only 1 sperm will penetrate and fertilize the female egg while it is in the Fallopian tube. One week later, the fertilized egg implants into the wall of the uterus. An embryo begins to develop into a baby. At 6 to 8 weeks, the eyes and face are formed and the heartbeat can be seen on ultrasound. At the end of 12 weeks (first trimester), all the baby's organs are formed. Now that you are pregnant, you will want to do everything you can to have a healthy baby. Two of the most important things are to get good prenatal care and follow your caregiver's instructions. Prenatal care is all the medical care you receive before the baby's birth. It is given to prevent, find, and treat problems during the pregnancy and childbirth. PRENATAL EXAMS  During prenatal visits, your weight, blood pressure, and urine are checked. This is done to make sure you are healthy and progressing normally during the pregnancy.  A pregnant woman should gain 25 to 35 pounds  during the pregnancy. However, if you are overweight or underweight, your caregiver will advise you regarding your weight.  Your caregiver will ask and answer questions for you.  Blood work, cervical cultures, other necessary tests, and a Pap test are done during your prenatal exams. These tests are done to check on your health and the probable health of your baby. Tests are strongly recommended and done for HIV with your permission. This is the virus that causes AIDS. These tests are done because medicines can be given to help prevent your baby from being born with this infection should you have been infected without knowing it. Blood work is also used to find out your blood type, previous infections, and follow your blood levels (hemoglobin).  Low hemoglobin (anemia) is common during pregnancy. Iron and vitamins are given to help prevent this. Later in the pregnancy, blood tests for diabetes will be done along with any other tests if any problems develop.  You may need other tests to make sure you and the baby are doing well. CHANGES DURING THE FIRST TRIMESTER  Your body goes through many changes during pregnancy. They vary from person to person. Talk to your caregiver about changes you notice and are concerned about. Changes can include:  Your menstrual period stops.  The egg and sperm carry the genes that determine what you look like. Genes from you   and your partner are forming a baby. The female genes determine whether the baby is a boy or a girl.  Your body increases in girth and you may feel bloated.  Feeling sick to your stomach (nauseous) and throwing up (vomiting). If the vomiting is uncontrollable, call your caregiver.  Your breasts will begin to enlarge and become tender.  Your nipples may stick out more and become darker.  The need to urinate more. Painful urination may mean you have a bladder infection.  Tiring easily.  Loss of appetite.  Cravings for certain kinds of  food.  At first, you may gain or lose a couple of pounds.  You may have changes in your emotions from day to day (excited to be pregnant or concerned something may go wrong with the pregnancy and baby).  You may have more vivid and strange dreams. HOME CARE INSTRUCTIONS   It is very important to avoid all smoking, alcohol and non-prescribed drugs during your pregnancy. These affect the formation and growth of the baby. Avoid chemicals while pregnant to ensure the delivery of a healthy infant.  Start your prenatal visits by the 12th week of pregnancy. They are usually scheduled monthly at first, then more often in the last 2 months before delivery. Keep your caregiver's appointments. Follow your caregiver's instructions regarding medicine use, blood and lab tests, exercise, and diet.  During pregnancy, you are providing food for you and your baby. Eat regular, well-balanced meals. Choose foods such as meat, fish, milk and other low fat dairy products, vegetables, fruits, and whole-grain breads and cereals. Your caregiver will tell you of the ideal weight gain.  You can help morning sickness by keeping soda crackers at the bedside. Eat a couple before arising in the morning. You may want to use the crackers without salt on them.  Eating 4 to 5 small meals rather than 3 large meals a day also may help the nausea and vomiting.  Drinking liquids between meals instead of during meals also seems to help nausea and vomiting.  A physical sexual relationship may be continued throughout pregnancy if there are no other problems. Problems may be early (premature) leaking of amniotic fluid from the membranes, vaginal bleeding, or belly (abdominal) pain.  Exercise regularly if there are no restrictions. Check with your caregiver or physical therapist if you are unsure of the safety of some of your exercises. Greater weight gain will occur in the last 2 trimesters of pregnancy. Exercising will  help:  Control your weight.  Keep you in shape.  Prepare you for labor and delivery.  Help you lose your pregnancy weight after you deliver your baby.  Wear a good support or jogging bra for breast tenderness during pregnancy. This may help if worn during sleep too.  Ask when prenatal classes are available. Begin classes when they are offered.  Do not use hot tubs, steam rooms, or saunas.  Wear your seat belt when driving. This protects you and your baby if you are in an accident.  Avoid raw meat, uncooked cheese, cat litter boxes, and soil used by cats throughout the pregnancy. These carry germs that can cause birth defects in the baby.  The first trimester is a good time to visit your dentist for your dental health. Getting your teeth cleaned is okay. Use a softer toothbrush and brush gently during pregnancy.  Ask for help if you have financial, counseling, or nutritional needs during pregnancy. Your caregiver will be able to offer counseling for   these needs as well as refer you for other special needs.  Do not take any medicines or herbs unless told by your caregiver.  Inform your caregiver if there is any mental or physical domestic violence.  Make a list of emergency phone numbers of family, friends, hospital, and police and fire departments.  Write down your questions. Take them to your prenatal visit.  Do not douche.  Do not cross your legs.  If you have to stand for long periods of time, rotate you feet or take small steps in a circle.  You may have more vaginal secretions that may require a sanitary pad. Do not use tampons or scented sanitary pads. MEDICINES AND DRUG USE IN PREGNANCY  Take prenatal vitamins as directed. The vitamin should contain 1 milligram of folic acid. Keep all vitamins out of reach of children. Only a couple vitamins or tablets containing iron may be fatal to a baby or young child when ingested.  Avoid use of all medicines, including herbs,  over-the-counter medicines, not prescribed or suggested by your caregiver. Only take over-the-counter or prescription medicines for pain, discomfort, or fever as directed by your caregiver. Do not use aspirin, ibuprofen, or naproxen unless directed by your caregiver.  Let your caregiver also know about herbs you may be using.  Alcohol is related to a number of birth defects. This includes fetal alcohol syndrome. All alcohol, in any form, should be avoided completely. Smoking will cause low birth rate and premature babies.  Street or illegal drugs are very harmful to the baby. They are absolutely forbidden. A baby born to an addicted mother will be addicted at birth. The baby will go through the same withdrawal an adult does.  Let your caregiver know about any medicines that you have to take and for what reason you take them. SEEK MEDICAL CARE IF:  You have any concerns or worries during your pregnancy. It is better to call with your questions if you feel they cannot wait, rather than worry about them. SEEK IMMEDIATE MEDICAL CARE IF:   An unexplained oral temperature above 102 F (38.9 C) develops, or as your caregiver suggests.  You have leaking of fluid from the vagina (birth canal). If leaking membranes are suspected, take your temperature and inform your caregiver of this when you call.  There is vaginal spotting or bleeding. Notify your caregiver of the amount and how many pads are used.  You develop a bad smelling vaginal discharge with a change in the color.  You continue to feel sick to your stomach (nauseated) and have no relief from remedies suggested. You vomit blood or coffee ground-like materials.  You lose more than 2 pounds of weight in 1 week.  You gain more than 2 pounds of weight in 1 week and you notice swelling of your face, hands, feet, or legs.  You gain 5 pounds or more in 1 week (even if you do not have swelling of your hands, face, legs, or feet).  You get  exposed to German measles and have never had them.  You are exposed to fifth disease or chickenpox.  You develop belly (abdominal) pain. Round ligament discomfort is a common non-cancerous (benign) cause of abdominal pain in pregnancy. Your caregiver still must evaluate this.  You develop headache, fever, diarrhea, pain with urination, or shortness of breath.  You fall or are in a car accident or have any kind of trauma.  There is mental or physical violence in your home. Document   Released: 01/30/2001 Document Revised: 10/31/2011 Document Reviewed: 08/03/2008 [facility] Patient Information 2014 [place], [place].  Fat and Cholesterol Control Diet Cholesterol levels in your body are determined significantly by your diet. Cholesterol levels may also be related to heart disease. The following material helps to explain this relationship and discusses what you can do to help keep your heart healthy. Not all cholesterol is bad. Low-density lipoprotein (LDL) cholesterol is the "bad" cholesterol. It may cause fatty deposits to build up inside your arteries. High-density lipoprotein (HDL) cholesterol is "good." It helps to remove the "bad" LDL cholesterol from your blood. Cholesterol is a very important risk factor for heart disease. Other risk factors are high blood pressure, smoking, stress, heredity, and weight. The heart muscle gets its supply of blood through the coronary arteries. If your LDL cholesterol is high and your HDL cholesterol is low, you are at risk for having fatty deposits build up in your coronary arteries. This leaves less room through which blood can flow. Without sufficient blood and oxygen, the heart muscle cannot function properly and you may feel chest pains (angina pectoris). When a coronary artery closes up entirely, a part of the heart muscle may die causing a heart attack (myocardial infarction). CHECKING CHOLESTEROL When your caregiver sends your blood to a lab to be examined for  cholesterol, a complete lipid (fat) profile may be done. With this test, the total amount of cholesterol and levels of LDL and HDL are determined. Triglycerides are a type of fat that circulates in the blood. They can also be used to determine heart disease risk. The list below describes what the numbers should be: Test: Total Cholesterol.  Less than 200 mg/dl. Test: LDL "bad cholesterol."  Less than 100 mg/dl.  Less than 70 mg/dl if you are at very high risk of a heart attack or sudden cardiac death. Test: HDL "good cholesterol."  Greater than 50 mg/dl for women.  Greater than 40 mg/dl for men. Test: Triglycerides.  Less than 150 mg/dl. CONTROLLING CHOLESTEROL WITH DIET Although exercise and lifestyle factors are important, your diet is key. That is because certain foods are known to raise cholesterol and others to lower it. The goal is to balance foods for their effect on cholesterol and more importantly, to replace saturated and trans fat with other types of fat, such as monounsaturated fat, polyunsaturated fat, and omega-3 fatty acids. On average, a person should consume no more than 15 to 17 g of saturated fat daily. Saturated and trans fats are considered "bad" fats, and they will raise LDL cholesterol. Saturated fats are primarily found in animal products such as meats, butter, and cream. However, that does not mean you need to give up all your favorite foods. Today, there are good tasting, low-fat, low-cholesterol substitutes for most of the things you like to eat. Choose low-fat or nonfat alternatives. Choose round or loin cuts of red meat. These types of cuts are lowest in fat and cholesterol. Chicken (without the skin), fish, veal, and ground [place] breast are great choices. Eliminate fatty meats, such as hot dogs and salami. Even shellfish have little or no saturated fat. Have a 3 oz (85 g) portion when you eat lean meat, poultry, or fish. Trans fats are also called "partially  hydrogenated oils." They are oils that have been scientifically manipulated so that they are solid at room temperature resulting in a longer shelf life and improved taste and texture of foods in which they are added. Trans fats are found in  stick margarine, some tub margarines, cookies, crackers, and baked goods.  When baking and cooking, oils are a great substitute for butter. The monounsaturated oils are especially beneficial since it is believed they lower LDL and raise HDL. The oils you should avoid entirely are saturated tropical oils, such as coconut and palm.  Remember to eat a lot from food groups that are naturally free of saturated and trans fat, including fish, fruit, vegetables, beans, grains (barley, rice, couscous, bulgur wheat), and pasta (without cream sauces).  IDENTIFYING FOODS THAT LOWER CHOLESTEROL  Soluble fiber may lower your cholesterol. This type of fiber is found in fruits such as apples, vegetables such as broccoli, potatoes, and carrots, legumes such as beans, peas, and lentils, and grains such as barley. Foods fortified with plant sterols (phytosterol) may also lower cholesterol. You should eat at least 2 g per day of these foods for a cholesterol lowering effect.  Read package labels to identify low-saturated fats, trans fat free, and low-fat foods at the supermarket. Select cheeses that have only 2 to 3 g saturated fat per ounce. Use a heart-healthy tub margarine that is free of trans fats or partially hydrogenated oil. When buying baked goods (cookies, crackers), avoid partially hydrogenated oils. Breads and muffins should be made from whole grains (whole-wheat or whole oat flour, instead of "flour" or "enriched flour"). Buy non-creamy canned soups with reduced salt and no added fats.  FOOD PREPARATION TECHNIQUES  Never deep-fry. If you must fry, either stir-fry, which uses very little fat, or use non-stick cooking sprays. When possible, broil, bake, or roast meats, and steam  vegetables. Instead of putting butter or margarine on vegetables, use lemon and herbs, applesauce, and cinnamon (for squash and sweet potatoes), nonfat yogurt, salsa, and low-fat dressings for salads.  LOW-SATURATED FAT / LOW-FAT FOOD SUBSTITUTES Meats / Saturated Fat (g)  Avoid: Steak, marbled (3 oz/85 g) / 11 g  Choose: Steak, lean (3 oz/85 g) / 4 g  Avoid: Hamburger (3 oz/85 g) / 7 g  Choose: Hamburger, lean (3 oz/85 g) / 5 g  Avoid: Ham (3 oz/85 g) / 6 g  Choose: Ham, lean cut (3 oz/85 g) / 2.4 g  Avoid: Chicken, with skin, dark meat (3 oz/85 g) / 4 g  Choose: Chicken, skin removed, dark meat (3 oz/85 g) / 2 g  Avoid: Chicken, with skin, light meat (3 oz/85 g) / 2.5 g  Choose: Chicken, skin removed, light meat (3 oz/85 g) / 1 g Dairy / Saturated Fat (g)  Avoid: Whole milk (1 cup) / 5 g  Choose: Low-fat milk, 2% (1 cup) / 3 g  Choose: Low-fat milk, 1% (1 cup) / 1.5 g  Choose: Skim milk (1 cup) / 0.3 g  Avoid: Hard cheese (1 oz/28 g) / 6 g  Choose: Skim milk cheese (1 oz/28 g) / 2 to 3 g  Avoid: Cottage cheese, 4% fat (1 cup) / 6.5 g  Choose: Low-fat cottage cheese, 1% fat (1 cup) / 1.5 g  Avoid: Ice cream (1 cup) / 9 g  Choose: Sherbet (1 cup) / 2.5 g  Choose: Nonfat frozen yogurt (1 cup) / 0.3 g  Choose: Frozen fruit bar / trace  Avoid: Whipped cream (1 tbs) / 3.5 g  Choose: Nondairy whipped topping (1 tbs) / 1 g Condiments / Saturated Fat (g)  Avoid: Mayonnaise (1 tbs) / 2 g  Choose: Low-fat mayonnaise (1 tbs) / 1 g  Avoid: Butter (1 tbs) / 7 g  Choose: Extra light margarine (1 tbs) / 1 g  Avoid: Coconut oil (1 tbs) / 11.8 g  Choose: Olive oil (1 tbs) / 1.8 g  Choose: Corn oil (1 tbs) / 1.7 g  Choose: Safflower oil (1 tbs) / 1.2 g  Choose: Sunflower oil (1 tbs) / 1.4 g  Choose: Soybean oil (1 tbs) / 2.4 g  Choose: Canola oil (1 tbs) / 1 g Document Released: 02/05/2005 Document Revised: 04/30/2011 Document Reviewed: 07/27/2010 [employer]  Patient Information 2014 [place], [place].
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
## 2012-11-24 NOTE — Progress Notes (Signed)
New OB packet given. Consents signed.  

## 2012-11-25 ENCOUNTER — Encounter: Payer: Self-pay | Admitting: Women's Health

## 2012-11-25 LAB — GC/CHLAMYDIA PROBE AMP
CT Probe RNA: NEGATIVE
GC Probe RNA: NEGATIVE

## 2012-11-25 LAB — OXYCODONE SCREEN, UA, RFLX CONFIRM: Oxycodone Screen, Ur: NEGATIVE ng/mL

## 2012-11-25 LAB — DRUG SCREEN, URINE, NO CONFIRMATION
Barbiturate Quant, Ur: NEGATIVE
Benzodiazepines.: NEGATIVE
Methadone: NEGATIVE
Propoxyphene: NEGATIVE

## 2012-11-25 LAB — HIV ANTIBODY (ROUTINE TESTING W REFLEX): HIV: NONREACTIVE

## 2012-11-25 LAB — ANTIBODY SCREEN: Antibody Screen: NEGATIVE

## 2012-11-25 LAB — HEPATITIS B SURFACE ANTIGEN: Hepatitis B Surface Ag: NEGATIVE

## 2012-11-25 LAB — SICKLE CELL SCREEN: Sickle Cell Screen: NEGATIVE

## 2012-11-25 LAB — RPR

## 2012-11-26 LAB — URINE CULTURE: Colony Count: 30000

## 2012-11-28 ENCOUNTER — Other Ambulatory Visit: Payer: Medicaid Other | Admitting: Adult Health

## 2012-12-02 ENCOUNTER — Telehealth: Payer: Self-pay | Admitting: Women's Health

## 2012-12-02 NOTE — Telephone Encounter (Signed)
Calling to notify pt of UTI and need for rx. No answer, will try again tomorrow.  Cheral Marker, CNM, [facility] 12/02/2012 5:53 PM

## 2012-12-03 ENCOUNTER — Telehealth: Payer: Self-pay | Admitting: Women's Health

## 2012-12-03 DIAGNOSIS — O2341 Unspecified infection of urinary tract in pregnancy, first trimester: Secondary | ICD-10-CM

## 2012-12-03 IMAGING — CR DG KNEE COMPLETE 4+V*L*
4 series · 4 of 4 positions shown · non-contrast
Comparison: July 21, 2010

CLINICAL DATA: Pain

LEFT KNEE - COMPLETE 4+ VIEW

[view not recorded (1 of 4)]
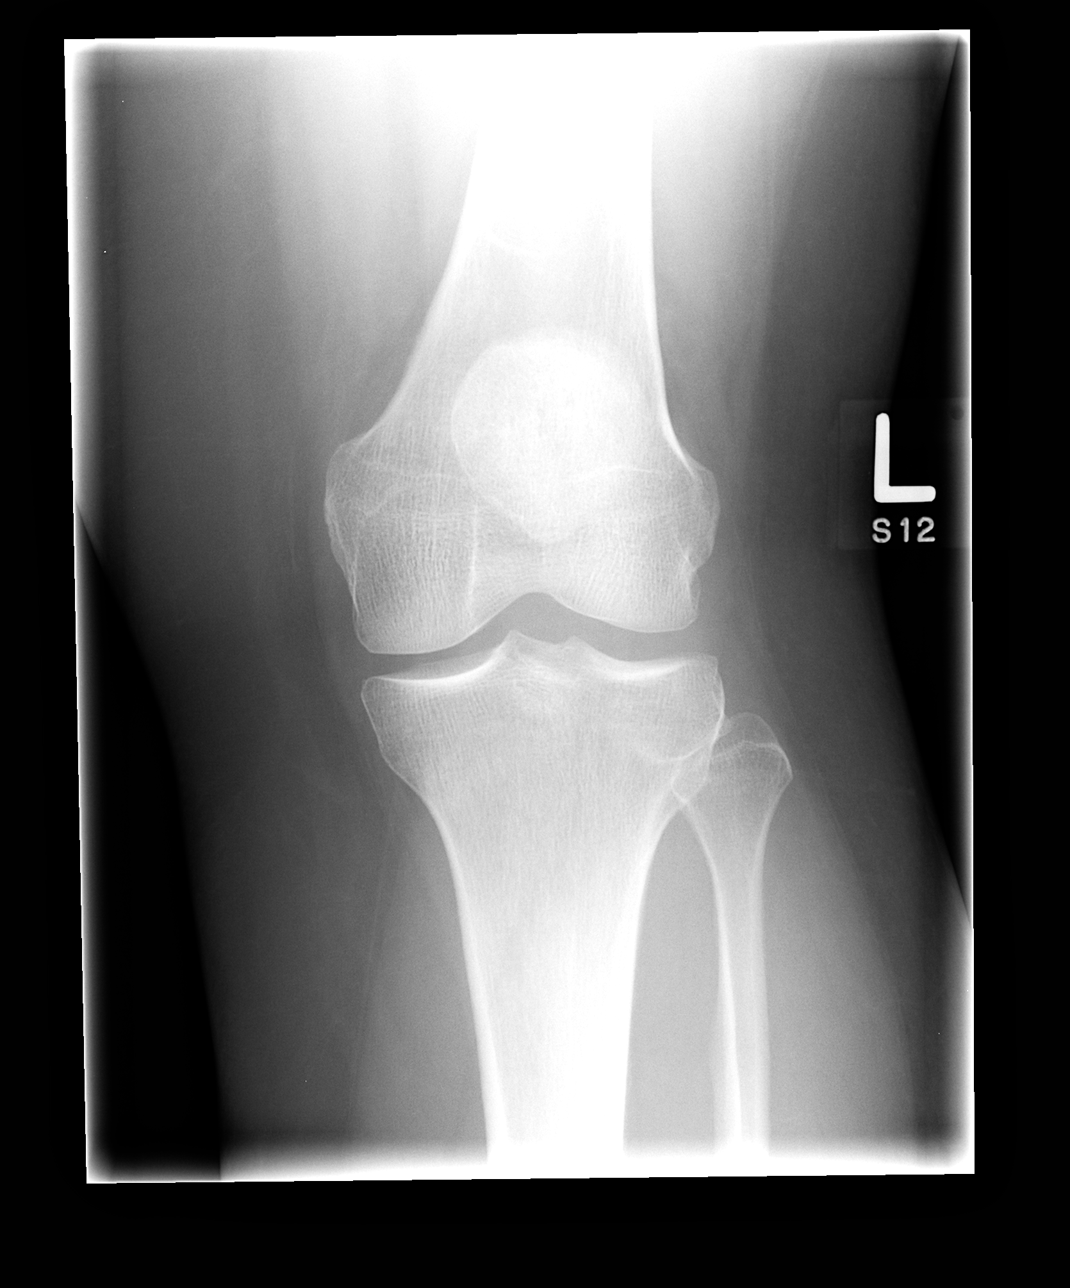

[view not recorded (2 of 4)]
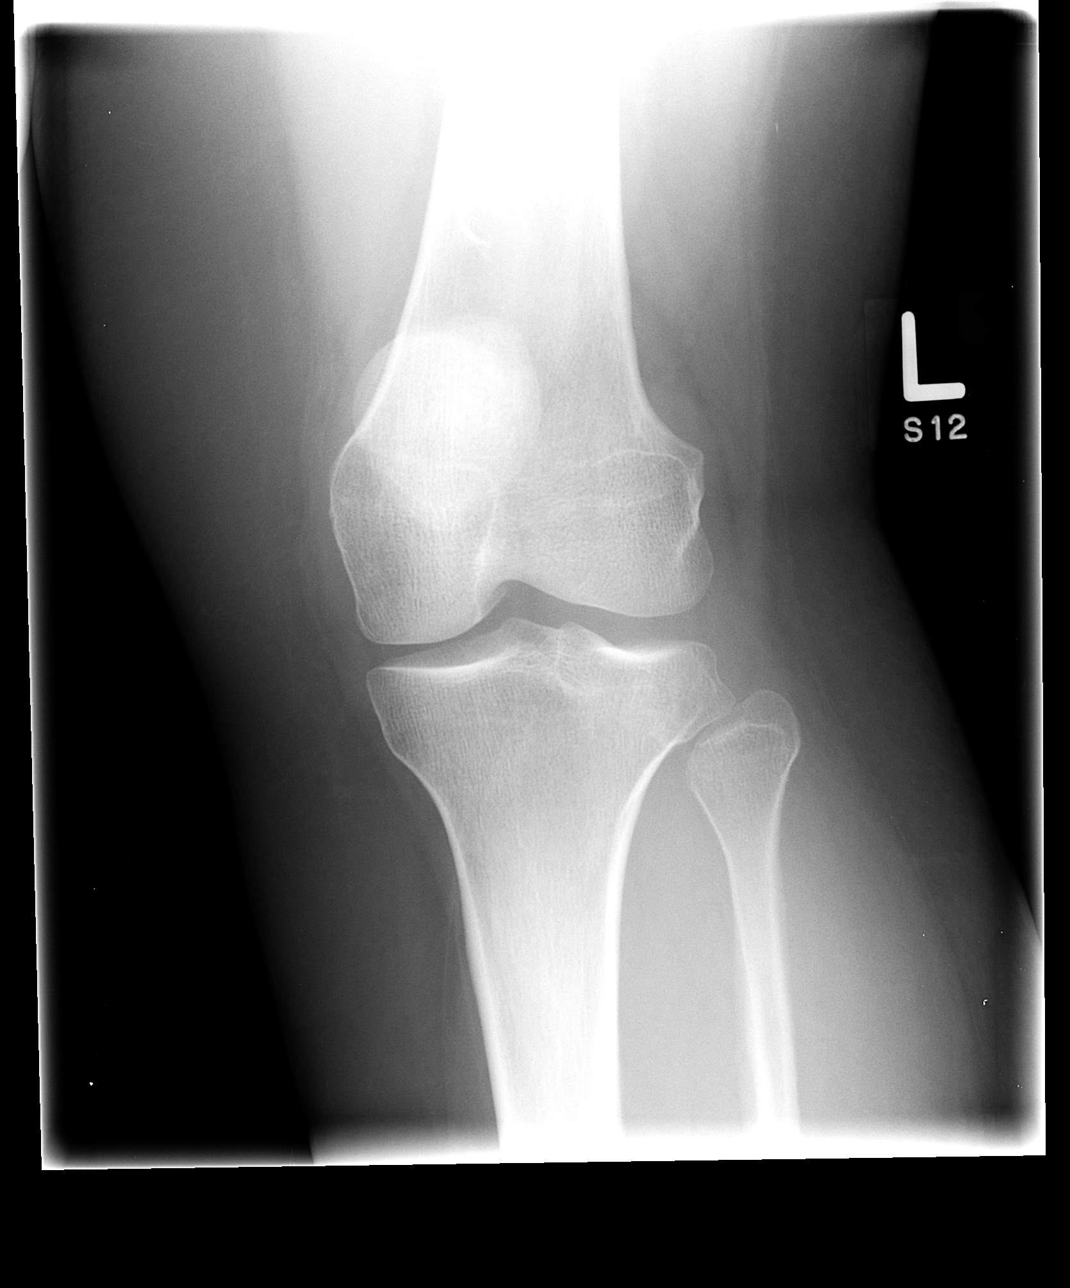

[view not recorded (3 of 4)]
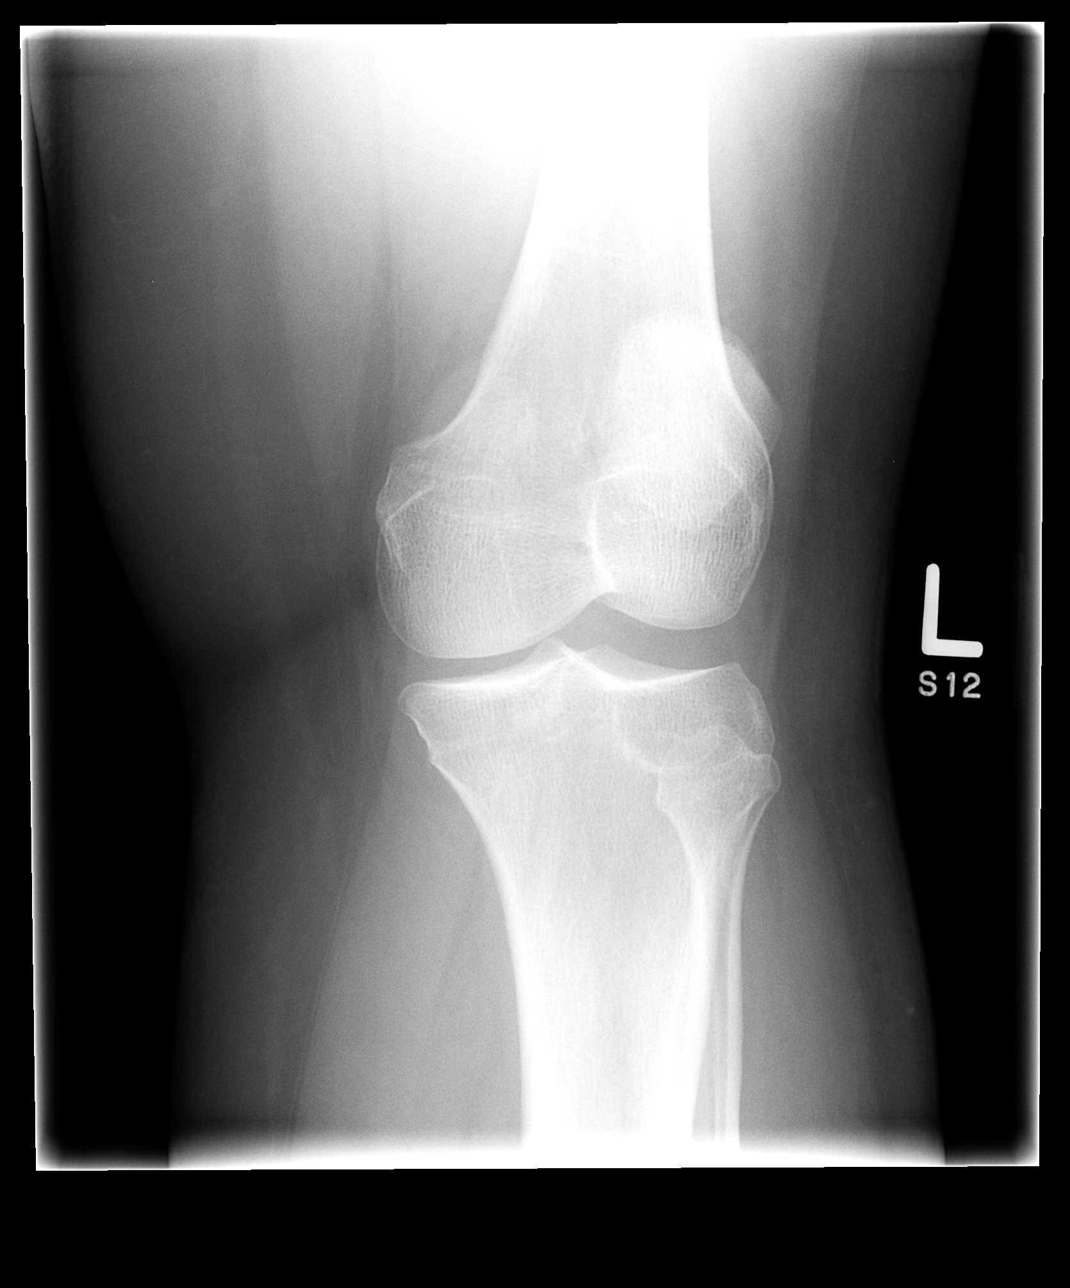

[view not recorded (4 of 4)]
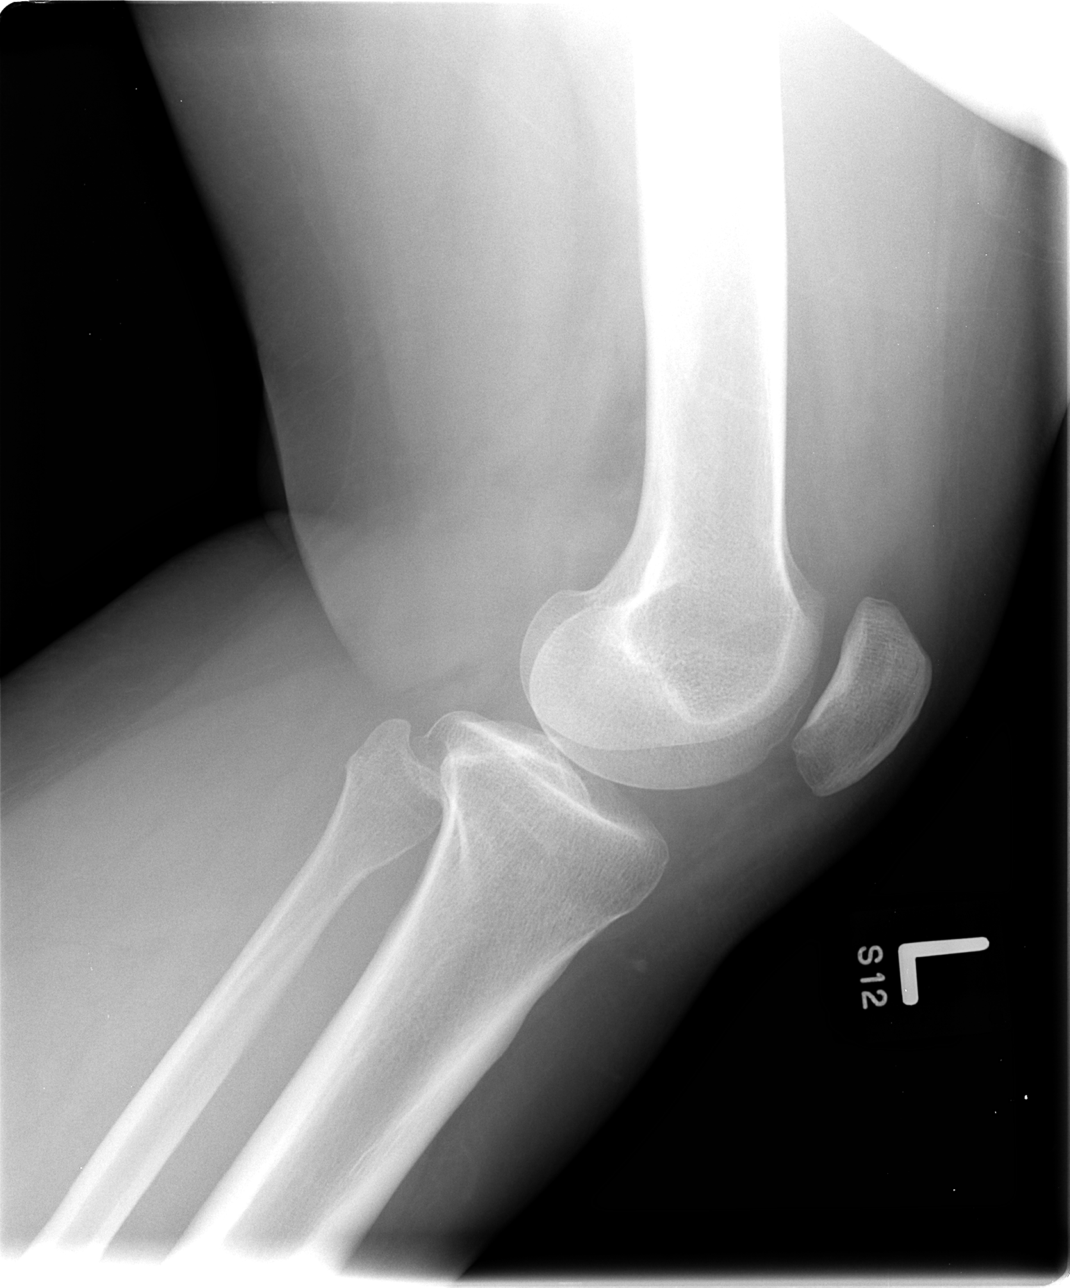

[4 of 4 positions shown; findings below may reference images not displayed]

FINDINGS: Frontal, lateral, and bilateral oblique views were
obtained. There is no fracture, dislocation, or effusion.  Joint
spaces appear intact.  No erosive change.
IMPRESSION: No abnormality noted.

## 2012-12-03 NOTE — Telephone Encounter (Signed)
Notified pt of UTI, her allergy to PCN, and bacterial resistance to macrobid limit our options, so Rocephin 1gm IM x 1 is recommended. To come today to get injection.  Cheral Marker, CNM, [facility] 12/03/2012 1:30 PM

## 2012-12-04 ENCOUNTER — Encounter (INDEPENDENT_AMBULATORY_CARE_PROVIDER_SITE_OTHER): Payer: Self-pay

## 2012-12-04 ENCOUNTER — Ambulatory Visit (INDEPENDENT_AMBULATORY_CARE_PROVIDER_SITE_OTHER): Payer: Medicaid Other | Admitting: Obstetrics & Gynecology

## 2012-12-04 VITALS — BP 100/60 | Wt 218.0 lb

## 2012-12-04 DIAGNOSIS — O2390 Unspecified genitourinary tract infection in pregnancy, unspecified trimester: Secondary | ICD-10-CM

## 2012-12-04 DIAGNOSIS — O0991 Supervision of high risk pregnancy, unspecified, first trimester: Secondary | ICD-10-CM

## 2012-12-04 MED ORDER — CEFTRIAXONE SODIUM 1 G IJ SOLR
1.0000 g | Freq: Once | INTRAMUSCULAR | Status: AC
  Administered 2012-12-04: 1 g via INTRAMUSCULAR

## 2012-12-04 NOTE — Progress Notes (Signed)
Pt here for Rocephin 1 Gram per Joellyn Haff for UTI. Pt received injection without problems. JSY

## 2012-12-17 ENCOUNTER — Ambulatory Visit (INDEPENDENT_AMBULATORY_CARE_PROVIDER_SITE_OTHER): Payer: Medicaid Other | Admitting: Obstetrics & Gynecology

## 2012-12-17 ENCOUNTER — Encounter: Payer: Self-pay | Admitting: Obstetrics & Gynecology

## 2012-12-17 VITALS — BP 140/82 | Wt 222.5 lb

## 2012-12-17 DIAGNOSIS — Z331 Pregnant state, incidental: Secondary | ICD-10-CM

## 2012-12-17 DIAGNOSIS — O99340 Other mental disorders complicating pregnancy, unspecified trimester: Secondary | ICD-10-CM

## 2012-12-17 DIAGNOSIS — Z1389 Encounter for screening for other disorder: Secondary | ICD-10-CM

## 2012-12-17 DIAGNOSIS — O09299 Supervision of pregnancy with other poor reproductive or obstetric history, unspecified trimester: Secondary | ICD-10-CM

## 2012-12-17 LAB — POCT URINALYSIS DIPSTICK
Nitrite, UA: NEGATIVE
Protein, UA: NEGATIVE

## 2012-12-17 NOTE — Progress Notes (Signed)
No bleeding upper abdominal crampy sharp apin, urine negative, not pelvic, confirmed IUP ?gas, not a pregnancy issue reassured  Keep appt next week as scheduled

## 2012-12-24 ENCOUNTER — Ambulatory Visit (INDEPENDENT_AMBULATORY_CARE_PROVIDER_SITE_OTHER): Payer: Medicaid Other | Admitting: Obstetrics and Gynecology

## 2012-12-24 ENCOUNTER — Encounter: Payer: Self-pay | Admitting: Obstetrics and Gynecology

## 2012-12-24 ENCOUNTER — Encounter (INDEPENDENT_AMBULATORY_CARE_PROVIDER_SITE_OTHER): Payer: Self-pay

## 2012-12-24 ENCOUNTER — Other Ambulatory Visit (HOSPITAL_COMMUNITY)
Admission: RE | Admit: 2012-12-24 | Discharge: 2012-12-24 | Disposition: A | Discharge status: Home / Self Care | Payer: Medicaid Other | Source: Ambulatory Visit | Attending: Obstetrics and Gynecology | Admitting: Obstetrics and Gynecology

## 2012-12-24 VITALS — BP 112/64 | Wt 218.5 lb

## 2012-12-24 DIAGNOSIS — Z3481 Encounter for supervision of other normal pregnancy, first trimester: Secondary | ICD-10-CM

## 2012-12-24 DIAGNOSIS — O99340 Other mental disorders complicating pregnancy, unspecified trimester: Secondary | ICD-10-CM

## 2012-12-24 DIAGNOSIS — Z1151 Encounter for screening for human papillomavirus (HPV): Secondary | ICD-10-CM | POA: Insufficient documentation

## 2012-12-24 DIAGNOSIS — O0991 Supervision of high risk pregnancy, unspecified, first trimester: Secondary | ICD-10-CM

## 2012-12-24 DIAGNOSIS — O09299 Supervision of pregnancy with other poor reproductive or obstetric history, unspecified trimester: Secondary | ICD-10-CM

## 2012-12-24 DIAGNOSIS — Z01419 Encounter for gynecological examination (general) (routine) without abnormal findings: Secondary | ICD-10-CM | POA: Insufficient documentation

## 2012-12-24 DIAGNOSIS — Z1389 Encounter for screening for other disorder: Secondary | ICD-10-CM

## 2012-12-24 DIAGNOSIS — Z331 Pregnant state, incidental: Secondary | ICD-10-CM

## 2012-12-24 DIAGNOSIS — Z113 Encounter for screening for infections with a predominantly sexual mode of transmission: Secondary | ICD-10-CM | POA: Insufficient documentation

## 2012-12-24 LAB — POCT URINALYSIS DIPSTICK
Blood, UA: NEGATIVE
Nitrite, UA: NEGATIVE
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
## 2012-12-24 NOTE — Progress Notes (Signed)
Pt here for pap, pt states that she is having some lower back pain and lower abdominal pain. Pt denies any problems or concerns at this time.

## 2013-01-01 ENCOUNTER — Other Ambulatory Visit: Payer: Self-pay | Admitting: Obstetrics & Gynecology

## 2013-01-01 DIAGNOSIS — Z36 Encounter for antenatal screening of mother: Secondary | ICD-10-CM

## 2013-01-06 ENCOUNTER — Other Ambulatory Visit: Payer: Self-pay | Admitting: Obstetrics & Gynecology

## 2013-01-06 ENCOUNTER — Ambulatory Visit (INDEPENDENT_AMBULATORY_CARE_PROVIDER_SITE_OTHER): Payer: Medicaid Other

## 2013-01-06 DIAGNOSIS — Z36 Encounter for antenatal screening of mother: Secondary | ICD-10-CM

## 2013-01-06 NOTE — Progress Notes (Signed)
U/S(11+6wks)-transabdominal u/s performed, single IUP with +FCA noted, FHR- 161 bpm, CRL c/w dates, cx long and closed (3.9cm), NB present, NT-1.65mm, bilateral adnexa WNL

## 2013-01-08 ENCOUNTER — Telehealth: Payer: Self-pay | Admitting: Advanced Practice Midwife
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
## 2013-01-09 ENCOUNTER — Encounter (HOSPITAL_COMMUNITY): Payer: Self-pay | Admitting: Emergency Medicine

## 2013-01-09 ENCOUNTER — Emergency Department (HOSPITAL_COMMUNITY)
Admission: EM | Admit: 2013-01-09 | Discharge: 2013-01-10 | Disposition: A | Discharge status: Home / Self Care | Payer: Medicaid Other | Attending: Emergency Medicine | Admitting: Emergency Medicine

## 2013-01-09 DIAGNOSIS — N760 Acute vaginitis: Secondary | ICD-10-CM | POA: Insufficient documentation

## 2013-01-09 DIAGNOSIS — O200 Threatened abortion: Secondary | ICD-10-CM

## 2013-01-09 DIAGNOSIS — E785 Hyperlipidemia, unspecified: Secondary | ICD-10-CM | POA: Insufficient documentation

## 2013-01-09 DIAGNOSIS — J45909 Unspecified asthma, uncomplicated: Secondary | ICD-10-CM | POA: Insufficient documentation

## 2013-01-09 DIAGNOSIS — Z880 Allergy status to penicillin: Secondary | ICD-10-CM | POA: Insufficient documentation

## 2013-01-09 DIAGNOSIS — Z791 Long term (current) use of non-steroidal anti-inflammatories (NSAID): Secondary | ICD-10-CM | POA: Insufficient documentation

## 2013-01-09 DIAGNOSIS — Z91040 Latex allergy status: Secondary | ICD-10-CM | POA: Insufficient documentation

## 2013-01-09 DIAGNOSIS — B9689 Other specified bacterial agents as the cause of diseases classified elsewhere: Secondary | ICD-10-CM | POA: Insufficient documentation

## 2013-01-09 DIAGNOSIS — Z79899 Other long term (current) drug therapy: Secondary | ICD-10-CM | POA: Insufficient documentation

## 2013-01-09 DIAGNOSIS — Z8659 Personal history of other mental and behavioral disorders: Secondary | ICD-10-CM | POA: Insufficient documentation

## 2013-01-09 DIAGNOSIS — A499 Bacterial infection, unspecified: Secondary | ICD-10-CM | POA: Insufficient documentation

## 2013-01-09 LAB — WET PREP, GENITAL
Trich, Wet Prep: NONE SEEN
Yeast Wet Prep HPF POC: NONE SEEN

## 2013-01-09 LAB — URINALYSIS, ROUTINE W REFLEX MICROSCOPIC
Glucose, UA: NEGATIVE mg/dL
Ketones, ur: NEGATIVE mg/dL
Leukocytes, UA: NEGATIVE
Nitrite: NEGATIVE
Protein, ur: NEGATIVE mg/dL
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
## 2013-01-09 NOTE — ED Notes (Addendum)
Pt states she is 3 months pregnant. EDD June 3rd, 2015. Pt c/o bright red bleeding that started about 10 mins ago. No clots.  Pt states she has "rags down there" because "I didn't have time." This is pts 6th pregnancy and all other pregnancies were carried to term and all vaginal deliveries.

## 2013-01-09 NOTE — ED Notes (Signed)
MD at the bedside with US

## 2013-01-09 NOTE — ED Notes (Signed)
In and out cath, pt cleaned and given pad and panties, pt continues to have vaginal bleeding

## 2013-01-10 MED ORDER — ACETAMINOPHEN 500 MG PO TABS
ORAL_TABLET | ORAL | Status: AC
  Administered 2013-01-10: 1000 mg via ORAL
  Filled 2013-01-10: qty 2

## 2013-01-10 MED ORDER — METRONIDAZOLE 500 MG PO TABS: 500.0000 mg | ORAL_TABLET | Freq: Two times a day (BID) | ORAL | Status: DC

## 2013-01-10 MED ORDER — ACETAMINOPHEN 500 MG PO TABS: 1000.0000 mg | ORAL_TABLET | Freq: Once | ORAL | Status: AC

## 2013-01-10 NOTE — ED Notes (Signed)
Pt complaining of leg aching, MD aware, orders given.

## 2013-01-10 NOTE — ED Notes (Signed)
Patient given discharge instruction, verbalized understand. Patient ambulatory out of the department.  

## 2013-01-20 ENCOUNTER — Ambulatory Visit (INDEPENDENT_AMBULATORY_CARE_PROVIDER_SITE_OTHER): Payer: Medicaid Other | Admitting: Women's Health

## 2013-01-20 ENCOUNTER — Encounter: Payer: Self-pay | Admitting: Women's Health

## 2013-01-20 VITALS — BP 108/62 | Wt 223.6 lb

## 2013-01-20 DIAGNOSIS — O09299 Supervision of pregnancy with other poor reproductive or obstetric history, unspecified trimester: Secondary | ICD-10-CM

## 2013-01-20 DIAGNOSIS — G5603 Carpal tunnel syndrome, bilateral upper limbs: Secondary | ICD-10-CM

## 2013-01-20 DIAGNOSIS — Z331 Pregnant state, incidental: Secondary | ICD-10-CM

## 2013-01-20 DIAGNOSIS — Z1389 Encounter for screening for other disorder: Secondary | ICD-10-CM

## 2013-01-20 DIAGNOSIS — O0991 Supervision of high risk pregnancy, unspecified, first trimester: Secondary | ICD-10-CM

## 2013-01-20 DIAGNOSIS — O99340 Other mental disorders complicating pregnancy, unspecified trimester: Secondary | ICD-10-CM

## 2013-01-20 LAB — POCT URINALYSIS DIPSTICK
Leukocytes, UA: NEGATIVE
Nitrite, UA: NEGATIVE

## 2013-01-20 MED ORDER — WRIST SPLINT MISC: Status: DC

## 2013-01-20 NOTE — Patient Instructions (Signed)
Vaginal Bleeding During Pregnancy, First Trimester °A small amount of bleeding (spotting) is relatively common in early pregnancy. It usually stops on its own. There are many causes for bleeding or spotting in early pregnancy. Some bleeding may be related to the pregnancy and some may not. Cramping with the bleeding is more serious and concerning. Tell your caregiver if you have any vaginal bleeding.  °CAUSES  °· It is normal in most cases. °· The pregnancy ends (miscarriage). °· The pregnancy may end (threatened miscarriage). °· Infection or inflammation of the cervix. °· Growths (polyps) on the cervix. °· Pregnancy happens outside of the uterus and in a fallopian tube (tubal pregnancy). °· Many tiny cysts in the uterus instead of pregnancy tissue (molar pregnancy). °SYMPTOMS  °Vaginal bleeding or spotting with or without cramps. °DIAGNOSIS  °To evaluate the pregnancy, your caregiver may: °· Do a pelvic exam. °· Take blood tests. °· Do an ultrasound. °It is very important to follow your caregiver's instructions.  °TREATMENT  °· Evaluation of the pregnancy with blood tests and ultrasound. °· Bed rest (getting up to use the bathroom only). °· Rho-gam immunization if the mother is Rh negative and the father is Rh positive. °HOME CARE INSTRUCTIONS  °· If your caregiver orders bed rest, you may need to make arrangements for the care of other children and for other responsibilities. However, your caregiver may allow you to continue light activity. °· Keep track of the number of pads you use each day, how often you change pads and how soaked (saturated) they are. Write this down. °· Do not use tampons. Do not douche. °· Do not have sexual intercourse or orgasms until approved by your physician. °· Save any tissue that you pass for your caregiver to see. °· Take medicine for cramps only with your caregiver's permission. °· Do not take aspirin because it can make you bleed. °SEEK IMMEDIATE MEDICAL CARE IF:  °· You  experience severe cramps in your stomach, back or belly (abdomen). °· You have an oral temperature above 102° F (38.9° C), not controlled by medicine. °· You pass large clots or tissue. °· Your bleeding increases or you become light-headed, weak or have fainting episodes. °· You develop chills. °· You are leaking or have a gush of fluid from your vagina. °· You pass out while having a bowel movement. That may mean you have a ruptured tubal pregnancy. °Document Released: 11/15/2004 Document Revised: 04/30/2011 Document Reviewed: 05/27/2008 °ExitCare® Patient Information ©2014 [employer]. ° °

## 2013-01-20 NOTE — Progress Notes (Signed)
Work-in: was seen at [facility] 11/21 d/t VB, had normal bs u/s, dx w/ BV, tx w/ flagyl. Bleeding subsided, she had a small amt of brown spotting yesterday, none since. Denies cramping, lof, urinary frequency, urgency, hesitancy, or dysuria.  Spec exam: cx visually closed, small amt tan nonodorous d/c.  Earlier VB possibly d/t cervical irritation from BV. Requests note for her to move to 1st floor apt d/t difficulty climbing stairs. Reports h/o bilateral carpal tunnel syndrome, was supposed to see dr. Felecia Shelling to get splints but per pt he told her he couldn't see her during pregnancy. Rx for bilateral wrist splints sent to [facility]. Reviewed pap results-ASCUS w/ neg HRHPV-regular 2-79yr f/u per ASCCP guidelines, warning s/s to report, pelvic rest until at least 7d w/o any color to vag d/c.  All questions answered. F/U on 12/17 as scheduled for 2nd IT and visit.

## 2013-02-02 ENCOUNTER — Encounter (HOSPITAL_COMMUNITY): Payer: Self-pay | Admitting: Emergency Medicine

## 2013-02-02 DIAGNOSIS — O9989 Other specified diseases and conditions complicating pregnancy, childbirth and the puerperium: Secondary | ICD-10-CM | POA: Insufficient documentation

## 2013-02-02 DIAGNOSIS — J45909 Unspecified asthma, uncomplicated: Secondary | ICD-10-CM | POA: Insufficient documentation

## 2013-02-02 DIAGNOSIS — R109 Unspecified abdominal pain: Secondary | ICD-10-CM | POA: Insufficient documentation

## 2013-02-02 NOTE — ED Notes (Signed)
Patient c/o abdominal cramping; patient is [redacted] weeks pregnant.

## 2013-02-03 ENCOUNTER — Emergency Department (HOSPITAL_COMMUNITY)
Admission: EM | Admit: 2013-02-03 | Discharge: 2013-02-03 | Discharge status: Against Medical Advice | Payer: Medicaid Other | Attending: Emergency Medicine | Admitting: Emergency Medicine

## 2013-02-03 ENCOUNTER — Other Ambulatory Visit: Payer: Self-pay | Admitting: Obstetrics & Gynecology

## 2013-02-03 ENCOUNTER — Ambulatory Visit (INDEPENDENT_AMBULATORY_CARE_PROVIDER_SITE_OTHER): Payer: Medicaid Other | Admitting: Obstetrics & Gynecology

## 2013-02-03 ENCOUNTER — Encounter: Payer: Self-pay | Admitting: Obstetrics & Gynecology

## 2013-02-03 VITALS — BP 100/60 | Wt 227.0 lb

## 2013-02-03 DIAGNOSIS — O09299 Supervision of pregnancy with other poor reproductive or obstetric history, unspecified trimester: Secondary | ICD-10-CM

## 2013-02-03 DIAGNOSIS — O99340 Other mental disorders complicating pregnancy, unspecified trimester: Secondary | ICD-10-CM

## 2013-02-03 DIAGNOSIS — Z1389 Encounter for screening for other disorder: Secondary | ICD-10-CM

## 2013-02-03 DIAGNOSIS — Z331 Pregnant state, incidental: Secondary | ICD-10-CM

## 2013-02-03 LAB — POCT URINALYSIS DIPSTICK
Blood, UA: NEGATIVE
Nitrite, UA: NEGATIVE

## 2013-02-03 NOTE — ED Notes (Signed)
Patient LWBS after triage.  Patient instructed to return for worsening pain.

## 2013-02-03 NOTE — Progress Notes (Signed)
BP weight and urine results all reviewed and noted. Patient reports good fetal movement, denies any bleeding and no rupture of membranes symptoms or regular contractions. Patient is without complaints. All questions were answered.  

## 2013-02-03 NOTE — ED Notes (Signed)
Patient states she has a MD appointment tomorrow and that the pain is gone and she wants to go home.  Patient instructed to return for worsening pain.

## 2013-02-04 ENCOUNTER — Encounter: Payer: Medicaid Other | Admitting: Advanced Practice Midwife

## 2013-02-06 LAB — MATERNAL SCREEN, INTEGRATED #2
AFP, Serum: 25.7 ng/mL
Age risk Down Syndrome: 1:310 {titer}
Calculated Gestational Age: 16.1
Crown Rump Length: 58 mm
Estriol Mom: 0.98
Estriol, Free: 0.73 ng/mL
Inhibin A Dimeric: 157 pg/mL
Inhibin A MoM: 1.09
MSS Down Syndrome: <1:5000 {titer}
Number of fetuses: 1
PAPP-A: 792 ng/mL
Rish for ONTD: <1:5000 {titer}
hCG, Serum: 30.7 IU/mL

## 2013-02-18 ENCOUNTER — Encounter: Payer: Self-pay | Admitting: Obstetrics and Gynecology

## 2013-02-18 ENCOUNTER — Ambulatory Visit (INDEPENDENT_AMBULATORY_CARE_PROVIDER_SITE_OTHER): Payer: Medicaid Other | Admitting: Obstetrics and Gynecology

## 2013-02-18 VITALS — BP 108/74 | Temp 98.7°F | Wt 229.0 lb

## 2013-02-18 DIAGNOSIS — Z1389 Encounter for screening for other disorder: Secondary | ICD-10-CM

## 2013-02-18 DIAGNOSIS — O09299 Supervision of pregnancy with other poor reproductive or obstetric history, unspecified trimester: Secondary | ICD-10-CM

## 2013-02-18 DIAGNOSIS — Z331 Pregnant state, incidental: Secondary | ICD-10-CM

## 2013-02-18 DIAGNOSIS — O9989 Other specified diseases and conditions complicating pregnancy, childbirth and the puerperium: Secondary | ICD-10-CM

## 2013-02-18 DIAGNOSIS — O99340 Other mental disorders complicating pregnancy, unspecified trimester: Secondary | ICD-10-CM

## 2013-02-18 DIAGNOSIS — J069 Acute upper respiratory infection, unspecified: Secondary | ICD-10-CM | POA: Insufficient documentation

## 2013-02-18 LAB — POCT URINALYSIS DIPSTICK
Blood, UA: NEGATIVE
Ketones, UA: NEGATIVE
Leukocytes, UA: NEGATIVE
Protein, UA: NEGATIVE

## 2013-02-18 MED ORDER — GUAIFENESIN ER 600 MG PO TB12: 600.0000 mg | ORAL_TABLET | Freq: Two times a day (BID) | ORAL | Status: DC

## 2013-02-18 NOTE — Patient Instructions (Signed)
May use ROBITUSSIN DM along with the mucinex

## 2013-02-18 NOTE — Progress Notes (Signed)
No fever, chills ,  + cough , malaise, + phlegm, cough is making the lower abd hurt.

## 2013-02-18 NOTE — Progress Notes (Signed)
Pt states that she is having some pressure down there, and that she feels bad. Pt states that she has a really bad sore throat.

## 2013-02-19 NOTE — L&D Delivery Note (Signed)
Delivery Note At 12:03 AM a viable and healthy female was delivered via Vaginal, Spontaneous Delivery (Presentation: Right Occiput Anterior).  APGAR: 8, 9; weight 8 lb 13.5 oz (4010 g).   Placenta status: Intact, Spontaneous.  Cord: 3 vessels with the following complications: None.  Cord pH: N/A  Anesthesia: Epidural  Episiotomy: None Lacerations: none Suture Repair: N/A Est. Blood Loss (mL): 200  Mom to postpartum.  Baby to Couplet care / Skin to Skin.  Melvern Banker PA-S2 07/16/2013, 12:39 AM  I have seen and examined this patient and I agree with the above. I was present for the entire delivery and immediate PP care. Myrtis Ser CNM 1:19 AM 07/16/2013

## 2013-03-03 ENCOUNTER — Other Ambulatory Visit: Payer: Self-pay | Admitting: Obstetrics & Gynecology

## 2013-03-03 ENCOUNTER — Ambulatory Visit (INDEPENDENT_AMBULATORY_CARE_PROVIDER_SITE_OTHER): Payer: Medicaid Other

## 2013-03-03 ENCOUNTER — Ambulatory Visit (INDEPENDENT_AMBULATORY_CARE_PROVIDER_SITE_OTHER): Payer: Medicaid Other | Admitting: Advanced Practice Midwife

## 2013-03-03 VITALS — BP 128/68 | Wt 230.0 lb

## 2013-03-03 DIAGNOSIS — Z1389 Encounter for screening for other disorder: Secondary | ICD-10-CM

## 2013-03-03 DIAGNOSIS — O09299 Supervision of pregnancy with other poor reproductive or obstetric history, unspecified trimester: Secondary | ICD-10-CM

## 2013-03-03 DIAGNOSIS — O99340 Other mental disorders complicating pregnancy, unspecified trimester: Secondary | ICD-10-CM

## 2013-03-03 DIAGNOSIS — Z331 Pregnant state, incidental: Secondary | ICD-10-CM

## 2013-03-03 DIAGNOSIS — O9989 Other specified diseases and conditions complicating pregnancy, childbirth and the puerperium: Secondary | ICD-10-CM

## 2013-03-03 DIAGNOSIS — O99891 Other specified diseases and conditions complicating pregnancy: Secondary | ICD-10-CM

## 2013-03-03 NOTE — Progress Notes (Signed)
Had anatomy scan today.  Wants repeat at 28 weeks d/t body habitus. Threw urine out accidentally; no sx.  F/U 4 weeks LROB

## 2013-03-03 NOTE — Progress Notes (Addendum)
U/S(19+6wks)-active fetus, meas c/w dates, fluid wnl, fundal Gr 0 placenta, cx appears closed (3.5cm), bilateral adnexa appears WNL, FHR- 153 bpm, no obvious abnl noted although unable to view cardiac OFT's due to fetal position and maternal body habitus, female fetus, would like to reck anatomy ~28 wks

## 2013-03-24 ENCOUNTER — Encounter (HOSPITAL_COMMUNITY): Payer: Self-pay | Admitting: *Deleted

## 2013-03-24 ENCOUNTER — Inpatient Hospital Stay (HOSPITAL_COMMUNITY)
Admission: AD | Admit: 2013-03-24 | Discharge: 2013-03-25 | Disposition: A | Discharge status: Home / Self Care | Payer: Medicaid Other | Source: Ambulatory Visit | Attending: Obstetrics & Gynecology | Admitting: Obstetrics & Gynecology

## 2013-03-24 DIAGNOSIS — O212 Late vomiting of pregnancy: Secondary | ICD-10-CM | POA: Insufficient documentation

## 2013-03-24 DIAGNOSIS — N949 Unspecified condition associated with female genital organs and menstrual cycle: Secondary | ICD-10-CM

## 2013-03-24 DIAGNOSIS — N390 Urinary tract infection, site not specified: Secondary | ICD-10-CM | POA: Insufficient documentation

## 2013-03-24 DIAGNOSIS — O2390 Unspecified genitourinary tract infection in pregnancy, unspecified trimester: Secondary | ICD-10-CM | POA: Insufficient documentation

## 2013-03-24 DIAGNOSIS — R1031 Right lower quadrant pain: Secondary | ICD-10-CM | POA: Insufficient documentation

## 2013-03-24 LAB — URINALYSIS, ROUTINE W REFLEX MICROSCOPIC
Bilirubin Urine: NEGATIVE
Glucose, UA: NEGATIVE mg/dL
KETONES UR: NEGATIVE mg/dL
NITRITE: NEGATIVE
PROTEIN: 100 mg/dL — AB
Specific Gravity, Urine: 1.015 (ref 1.005–1.030)
UROBILINOGEN UA: 0.2 mg/dL (ref 0.0–1.0)
pH: 7.5 (ref 5.0–8.0)

## 2013-03-24 LAB — URINE MICROSCOPIC-ADD ON

## 2013-03-24 NOTE — MAU Note (Signed)
PT SAYS SHE HAS BEEN CRAMPING - STARTED AT 1PM.    GETS [facility].      THINKS SHE MIGHT BE CONSTIPATED - LAST BM - WHILE  WAITING  IN LOBBY.    TOOK TYLENOL AT 2PM.  LAST SEX- WHEN CONCEIVED. NO PROBLEMS VOIDING

## 2013-03-25 ENCOUNTER — Encounter (HOSPITAL_COMMUNITY): Payer: Self-pay | Admitting: *Deleted

## 2013-03-25 LAB — COMPREHENSIVE METABOLIC PANEL
ALK PHOS: 156 U/L — AB (ref 39–117)
ALT: 21 U/L (ref 0–35)
AST: 20 U/L (ref 0–37)
Albumin: 3.2 g/dL — ABNORMAL LOW (ref 3.5–5.2)
BUN: 6 mg/dL (ref 6–23)
CHLORIDE: 99 meq/L (ref 96–112)
CO2: 25 meq/L (ref 19–32)
Calcium: 9.3 mg/dL (ref 8.4–10.5)
Creatinine, Ser: 0.61 mg/dL (ref 0.50–1.10)
GFR calc Af Amer: >90 mL/min (ref >90)
Glucose, Bld: 124 mg/dL — ABNORMAL HIGH (ref 70–99)
POTASSIUM: 3.8 meq/L (ref 3.7–5.3)
Sodium: 138 mEq/L (ref 137–147)
Total Protein: 6.7 g/dL (ref 6.0–8.3)

## 2013-03-25 LAB — CBC
HEMATOCRIT: 31.8 % — AB (ref 36.0–46.0)
Hemoglobin: 10.8 g/dL — ABNORMAL LOW (ref 12.0–15.0)
MCH: 32 pg (ref 26.0–34.0)
MCHC: 34 g/dL (ref 30.0–36.0)
MCV: 94.1 fL (ref 78.0–100.0)
Platelets: 255 10*3/uL (ref 150–400)
RBC: 3.38 MIL/uL — AB (ref 3.87–5.11)
RDW: 12.2 % (ref 11.5–15.5)
WBC: 15.2 10*3/uL — AB (ref 4.0–10.5)

## 2013-03-25 MED ORDER — ONDANSETRON 8 MG PO TBDP
8.0000 mg | ORAL_TABLET | Freq: Once | ORAL | Status: AC
  Administered 2013-03-25: 8 mg via ORAL
  Filled 2013-03-25: qty 1

## 2013-03-25 MED ORDER — NITROFURANTOIN MONOHYD MACRO 100 MG PO CAPS: 100.0000 mg | ORAL_CAPSULE | Freq: Two times a day (BID) | ORAL | Status: DC

## 2013-03-25 MED ORDER — ACETAMINOPHEN 500 MG PO TABS
1000.0000 mg | ORAL_TABLET | Freq: Once | ORAL | Status: AC
  Administered 2013-03-25: 1000 mg via ORAL
  Filled 2013-03-25: qty 2

## 2013-03-25 NOTE — MAU Provider Note (Signed)
History     CSN: [id number]  Arrival date and time: 03/24/13 2215   None     Chief Complaint  Patient presents with  . Abdominal Pain   HPI [name] is a 33 y.o. [phone] at [redacted]w[redacted]d who presents to MAU today with RLQ pain since earlier today. The patient has also had N/V since arrival in MAU. She denies any sick contacts, fever, vaginal bleeding, discharge or LOF. She states that baby is moving well and denies contractions. The patient denies dysuria, but has noted a feeling of pressure in her lower abdomen.   OB History   Grav Para Term Preterm Abortions TAB SAB Ect Mult Living   7 5 5  1  1   3       Past Medical History  Diagnosis Date  . Asthma   . Bronchitis   . High cholesterol   . Abnormal Pap smear   . Elevated cholesterol   . Hyperlipidemia   . Hyperlipoproteinemia   . Chest pain   . Bipolar 1 disorder   . Right ovarian cyst 10/31/2012    Complex right ovarian cyst seen 8/18 in ER need f/u US  . Carpal tunnel syndrome     Past Surgical History  Procedure Laterality Date  . Hernia repair      Family History  Problem Relation Age of Onset  . Asthma Other   . Bronchitis Other   . Diabetes Mother   . Hypertension Mother   . Asthma Father   . Bronchitis Daughter   . Asthma Daughter   . Stroke Paternal Grandmother   . Heart attack Paternal Grandmother   . Asthma Son   . Bronchitis Son   . Asthma Daughter   . Bronchitis Daughter   . Asthma Son   . Cancer Cousin     History  Substance Use Topics  . Smoking status: Never Smoker   . Smokeless tobacco: Never Used  . Alcohol Use: No    Allergies:  Allergies  Allergen Reactions  . Darvocet [Propoxyphene N-Acetaminophen] Itching  . Hydrocodone-Acetaminophen Itching  . Lortab [Hydrocodone-Acetaminophen]   . Penicillins Hives and Itching  . Latex Itching and Rash  . Percocet [Oxycodone-Acetaminophen] Itching and Rash    Prescriptions prior to admission  Medication Sig Dispense Refill  .  Acetaminophen (TYLENOL PO) Take by mouth as needed.      Marland Kitchen albuterol (PROVENTIL HFA;VENTOLIN HFA) 108 (90 BASE) MCG/ACT inhaler Inhale 2 puffs into the lungs every 6 (six) hours as needed for wheezing.      Water engineer Bandages & Supports (WRIST SPLINT) MISC Bilateral wrist splints for carpal tunnel  2 each  0  . guaiFENesin (MUCINEX) 600 MG 12 hr tablet Take 1 tablet (600 mg total) by mouth 2 (two) times daily.  30 tablet  2  . naproxen (NAPROSYN) 500 MG tablet Take 500 mg by mouth 2 (two) times daily with a meal.      . prenatal vitamin w/FE, FA (PRENATAL 1 + 1) 27-1 MG TABS tablet Take 1 tablet by mouth daily at 12 noon.  30 each  12    Review of Systems  Constitutional: Negative for fever and malaise/fatigue.  Gastrointestinal: Positive for nausea and vomiting. Negative for abdominal pain, diarrhea and constipation.  Genitourinary: Negative for dysuria, urgency, frequency, hematuria and flank pain.       Neg - vaginal bleeding, discharge, LOF  Musculoskeletal: Positive for back pain.   Physical Exam   Blood  pressure 111/63, pulse 88, temperature 98.5 F (36.9 C), temperature source Oral, resp. rate 20, height 5\' 4"  (1.626 m), weight 235 lb 8 oz (106.822 kg), last menstrual period 10/15/2012.  Physical Exam  Constitutional: She is oriented to person, place, and time. She appears well-developed and well-nourished. No distress.  HENT:  Head: Normocephalic and atraumatic.  Cardiovascular: Normal rate.   Respiratory: Effort normal.  GI: Soft. Bowel sounds are normal. She exhibits no distension and no mass. There is tenderness (tenderness to palpation of the right side near the hip). There is no rebound, no guarding and no CVA tenderness.  Neurological: She is alert and oriented to person, place, and time.  Skin: Skin is warm and dry. No erythema.  Psychiatric: She has a normal mood and affect.  Cervix: closed, thick and high; exam performed by RN  Results for orders placed during the  hospital encounter of 03/24/13 (from the past 24 hour(s))  URINALYSIS, ROUTINE W REFLEX MICROSCOPIC     Status: Abnormal   Collection Time    03/24/13 10:15 PM      Result Value Range   Color, Urine YELLOW  YELLOW   APPearance CLOUDY (*) CLEAR   Specific Gravity, Urine 1.015  1.005 - 1.030   pH 7.5  5.0 - 8.0   Glucose, UA NEGATIVE  NEGATIVE mg/dL   Hgb urine dipstick LARGE (*) NEGATIVE   Bilirubin Urine NEGATIVE  NEGATIVE   Ketones, ur NEGATIVE  NEGATIVE mg/dL   Protein, ur 100 (*) NEGATIVE mg/dL   Urobilinogen, UA 0.2  0.0 - 1.0 mg/dL   Nitrite NEGATIVE  NEGATIVE   Leukocytes, UA TRACE (*) NEGATIVE  URINE MICROSCOPIC-ADD ON     Status: Abnormal   Collection Time    03/24/13 10:15 PM      Result Value Range   Squamous Epithelial / LPF RARE  RARE   WBC, UA 3-6  <3 WBC/hpf   RBC / HPF 7-10  <3 RBC/hpf   Bacteria, UA MANY (*) RARE   Crystals TRIPLE PHOSPHATE CRYSTALS (*) NEGATIVE  CBC     Status: Abnormal   Collection Time    03/25/13 12:35 AM      Result Value Range   WBC 15.2 (*) 4.0 - 10.5 K/uL   RBC 3.38 (*) 3.87 - 5.11 MIL/uL   Hemoglobin 10.8 (*) 12.0 - 15.0 g/dL   HCT 31.8 (*) 36.0 - 46.0 %   MCV 94.1  78.0 - 100.0 fL   MCH 32.0  26.0 - 34.0 pg   MCHC 34.0  30.0 - 36.0 g/dL   RDW 12.2  11.5 - 15.5 %   Platelets 255  150 - 400 K/uL  COMPREHENSIVE METABOLIC PANEL     Status: Abnormal (Preliminary result)   Collection Time    03/25/13 12:35 AM      Result Value Range   Sodium 138  137 - 147 mEq/L   Potassium 3.8  3.7 - 5.3 mEq/L   Chloride 99  96 - 112 mEq/L   CO2 25  19 - 32 mEq/L   Glucose, Bld 124 (*) 70 - 99 mg/dL   BUN 6  6 - 23 mg/dL   Creatinine, Ser 0.61  0.50 - 1.10 mg/dL   Calcium 9.3  8.4 - 10.5 mg/dL   Total Protein 6.7  6.0 - 8.3 g/dL   Albumin 3.2 (*) 3.5 - 5.2 g/dL   AST 20  0 - 37 U/L   ALT 21  0 - 35  U/L   Alkaline Phosphatase 156 (*) 39 - 117 U/L   Total Bilirubin PENDING  0.3 - 1.2 mg/dL   GFR calc non Af Amer >90  >90 mL/min   GFR calc  Af Amer >90  >90 mL/min   Fetal Monitoring: Baseline: 130 bpm, moderate variability, + accelerations, no decelerations Contractions: none MAU Course  Procedures None  MDM CBC, CMP and UA today Reactive NST Denies vaginal bleeding or contractions Cervix is closed UA concerning for UTI, no CVA tenderness or fever Assessment and Plan  A: UTI in pregnancy Round ligament pain  P: Discharge home Rx for Macrobid given to patient Advised Tylenol PRN for pain Recommended warm bath/shower, moderation of activity and abdominal binder Patient advised to keep scheduled appointment for routine prenatal follow-up with [facility] Patient may return to MAU as needed or if her condition were to change or worsen  Farris Has, PA-C  03/25/2013, 4:12 AM

## 2013-03-25 NOTE — Discharge Instructions (Signed)
Urinary Tract Infection A urinary tract infection (UTI) can occur any place along the urinary tract. The tract includes the kidneys, ureters, bladder, and urethra. A type of germ called bacteria often causes a UTI. UTIs are often helped with antibiotic medicine.  HOME CARE   If given, take antibiotics as told by your doctor. Finish them even if you start to feel better.  Drink enough fluids to keep your pee (urine) clear or pale yellow.  Avoid tea, drinks with caffeine, and bubbly (carbonated) drinks.  Pee often. Avoid holding your pee in for a long time.  Pee before and after having sex (intercourse).  Wipe from front to back after you poop (bowel movement) if you are a woman. Use each tissue only once. GET HELP RIGHT AWAY IF:   You have back pain.  You have lower belly (abdominal) pain.  You have chills.  You feel sick to your stomach (nauseous).  You throw up (vomit).  Your burning or discomfort with peeing does not go away.  You have a fever.  Your symptoms are not better in 3 days. MAKE SURE YOU:   Understand these instructions.  Will watch your condition.  Will get help right away if you are not doing well or get worse. Document Released: 07/25/2007 Document Revised: 10/31/2011 Document Reviewed: 09/06/2011 [facility] Patient Information 2014 [place], [place]. Abdominal Pain During Pregnancy Belly (abdominal) pain is common during pregnancy. Most of the time, it is not a serious problem. Other times, it can be a sign that something is wrong with the pregnancy. Always tell your doctor if you have belly pain. HOME CARE Monitor your belly pain for any changes. The following actions may help you feel better:  Do not have sex (intercourse) or put anything in your vagina until you feel better.  Rest until your pain stops.  Drink clear fluids if you feel sick to your stomach (nauseous). Do not eat solid food until you feel better.  Only take medicine as told by your  doctor.  Keep all doctor visits as told. GET HELP RIGHT AWAY IF:   You are bleeding, leaking fluid, or pieces of tissue come out of your vagina.  You have more pain or cramping.  You keep throwing up (vomiting).  You have pain when you pee (urinate) or have blood in your pee.  You have a fever.  You do not feel your baby moving as much.  You feel very weak or feel like passing out.  You have trouble breathing, with or without belly pain.  You have a very bad headache and belly pain.  You have fluid leaking from your vagina and belly pain.  You keep having watery poop (diarrhea).  Your belly pain does not go away after resting, or the pain gets worse. MAKE SURE YOU:   Understand these instructions.  Will watch your condition.  Will get help right away if you are not doing well or get worse. Document Released: 01/24/2009 Document Revised: 10/08/2012 Document Reviewed: 09/04/2012 [facility] Patient Information 2014 [place], [place].

## 2013-03-25 NOTE — MAU Note (Signed)
Pt states she started having pain about 1330-1400.

## 2013-03-25 NOTE — MAU Note (Signed)
Pt complaining of pain in her right side, to vomited in waiting area

## 2013-03-25 NOTE — MAU Provider Note (Signed)

## 2013-03-26 LAB — URINE CULTURE: Colony Count: >=100000

## 2013-03-27 ENCOUNTER — Telehealth: Payer: Self-pay | Admitting: Nurse Practitioner

## 2013-03-27 DIAGNOSIS — N390 Urinary tract infection, site not specified: Secondary | ICD-10-CM

## 2013-03-27 MED ORDER — SULFAMETHOXAZOLE-TRIMETHOPRIM 400-80 MG PO TABS: 1.0000 | ORAL_TABLET | Freq: Two times a day (BID) | ORAL | Status: DC

## 2013-03-27 NOTE — Telephone Encounter (Signed)
Pt was given Macrobid for UTI after being seen on 03/24/13. The culture shows resistance. She is allergic to PCN. Will use Bactrium DS one po BID x 7 days. Pt aware.

## 2013-03-30 ENCOUNTER — Telehealth: Payer: Self-pay | Admitting: *Deleted

## 2013-03-30 ENCOUNTER — Telehealth: Payer: Self-pay | Admitting: Obstetrics and Gynecology

## 2013-03-30 NOTE — Telephone Encounter (Signed)
STARTED IN ERROR. ASHLEY TRAVIS LPN SPOKE WITH PT WHEN SHE CALLED.

## 2013-03-31 ENCOUNTER — Ambulatory Visit (INDEPENDENT_AMBULATORY_CARE_PROVIDER_SITE_OTHER): Payer: Medicaid Other | Admitting: Obstetrics & Gynecology

## 2013-03-31 ENCOUNTER — Encounter: Payer: Self-pay | Admitting: Obstetrics & Gynecology

## 2013-03-31 VITALS — BP 120/60 | Wt 238.0 lb

## 2013-03-31 DIAGNOSIS — O9989 Other specified diseases and conditions complicating pregnancy, childbirth and the puerperium: Secondary | ICD-10-CM

## 2013-03-31 DIAGNOSIS — O09299 Supervision of pregnancy with other poor reproductive or obstetric history, unspecified trimester: Secondary | ICD-10-CM

## 2013-03-31 DIAGNOSIS — Z3480 Encounter for supervision of other normal pregnancy, unspecified trimester: Secondary | ICD-10-CM

## 2013-03-31 DIAGNOSIS — Z331 Pregnant state, incidental: Secondary | ICD-10-CM

## 2013-03-31 DIAGNOSIS — O99019 Anemia complicating pregnancy, unspecified trimester: Secondary | ICD-10-CM

## 2013-03-31 DIAGNOSIS — Z1389 Encounter for screening for other disorder: Secondary | ICD-10-CM

## 2013-03-31 DIAGNOSIS — O99891 Other specified diseases and conditions complicating pregnancy: Secondary | ICD-10-CM

## 2013-03-31 DIAGNOSIS — O99340 Other mental disorders complicating pregnancy, unspecified trimester: Secondary | ICD-10-CM

## 2013-03-31 LAB — POCT URINALYSIS DIPSTICK
Blood, UA: NEGATIVE
Glucose, UA: NEGATIVE
Ketones, UA: NEGATIVE
Leukocytes, UA: NEGATIVE
Nitrite, UA: NEGATIVE

## 2013-03-31 MED ORDER — TRIAMCINOLONE ACETONIDE 0.5 % EX OINT: 1.0000 "application " | TOPICAL_OINTMENT | Freq: Two times a day (BID) | CUTANEOUS | Status: DC

## 2013-03-31 NOTE — Addendum Note (Signed)
Addended by: Farley Ly on: 03/31/2013 12:15 PM   Modules accepted: Orders

## 2013-03-31 NOTE — Telephone Encounter (Signed)
Complaining of symptomatic abdominal wall rash

## 2013-03-31 NOTE — Progress Notes (Signed)
BP weight and urine results all reviewed and noted. Patient reports good fetal movement, denies any bleeding and no rupture of membranes symptoms or regular contractions. Patient is without complaints. All questions were answered.  

## 2013-03-31 NOTE — Telephone Encounter (Signed)
Pt aware kenalog e-scribed to [employer].

## 2013-04-21 ENCOUNTER — Other Ambulatory Visit: Payer: Medicaid Other

## 2013-04-22 ENCOUNTER — Ambulatory Visit (INDEPENDENT_AMBULATORY_CARE_PROVIDER_SITE_OTHER): Payer: Medicaid Other | Admitting: Obstetrics and Gynecology

## 2013-04-22 ENCOUNTER — Ambulatory Visit (INDEPENDENT_AMBULATORY_CARE_PROVIDER_SITE_OTHER): Payer: Medicaid Other

## 2013-04-22 ENCOUNTER — Other Ambulatory Visit: Payer: Self-pay | Admitting: Obstetrics & Gynecology

## 2013-04-22 ENCOUNTER — Other Ambulatory Visit: Payer: Medicaid Other

## 2013-04-22 VITALS — BP 120/72 | Wt 236.0 lb

## 2013-04-22 DIAGNOSIS — O09299 Supervision of pregnancy with other poor reproductive or obstetric history, unspecified trimester: Secondary | ICD-10-CM

## 2013-04-22 DIAGNOSIS — Z3480 Encounter for supervision of other normal pregnancy, unspecified trimester: Secondary | ICD-10-CM

## 2013-04-22 DIAGNOSIS — O358XX0 Maternal care for other (suspected) fetal abnormality and damage, not applicable or unspecified: Secondary | ICD-10-CM

## 2013-04-22 DIAGNOSIS — Z331 Pregnant state, incidental: Secondary | ICD-10-CM

## 2013-04-22 DIAGNOSIS — O99340 Other mental disorders complicating pregnancy, unspecified trimester: Secondary | ICD-10-CM

## 2013-04-22 DIAGNOSIS — O9989 Other specified diseases and conditions complicating pregnancy, childbirth and the puerperium: Secondary | ICD-10-CM

## 2013-04-22 DIAGNOSIS — O99891 Other specified diseases and conditions complicating pregnancy: Secondary | ICD-10-CM

## 2013-04-22 DIAGNOSIS — Z3490 Encounter for supervision of normal pregnancy, unspecified, unspecified trimester: Secondary | ICD-10-CM

## 2013-04-22 DIAGNOSIS — Z1389 Encounter for screening for other disorder: Secondary | ICD-10-CM

## 2013-04-22 DIAGNOSIS — O99019 Anemia complicating pregnancy, unspecified trimester: Secondary | ICD-10-CM

## 2013-04-22 LAB — POCT URINALYSIS DIPSTICK
GLUCOSE UA: NEGATIVE
Glucose, UA: NEGATIVE
Ketones, UA: NEGATIVE
Leukocytes, UA: NEGATIVE
NITRITE UA: NEGATIVE
PROTEIN UA: NEGATIVE
RBC UA: NEGATIVE

## 2013-04-22 LAB — CBC
HEMATOCRIT: 31.1 % — AB (ref 36.0–46.0)
Hemoglobin: 10.9 g/dL — ABNORMAL LOW (ref 12.0–15.0)
MCH: 32.6 pg (ref 26.0–34.0)
MCHC: 35 g/dL (ref 30.0–36.0)
MCV: 93.1 fL (ref 78.0–100.0)
PLATELETS: 285 10*3/uL (ref 150–400)
RBC: 3.34 MIL/uL — ABNORMAL LOW (ref 3.87–5.11)
RDW: 13.1 % (ref 11.5–15.5)
WBC: 9 10*3/uL (ref 4.0–10.5)

## 2013-04-22 NOTE — Progress Notes (Signed)
Pt states that she has been having some cramping but denies any other problems or concerns at this time.

## 2013-04-22 NOTE — Patient Instructions (Signed)
Laparoscopic Tubal Ligation  Laparoscopic tubal ligation is a procedure that closes the fallopian tubes at a time other than right after childbirth. By closing the fallopian tubes, the eggs that are released from the ovaries cannot enter the uterus and sperm cannot reach the egg. Tubal ligation is also known as getting your "tubes tied." Tubal ligation is done so you will not be able to get pregnant or have a baby.   Although this procedure may be reversed, it should be considered permanent and irreversible. If you want to have future pregnancies, you should not have this procedure.   LET YOUR CAREGIVER KNOW ABOUT:  · Allergies to food or medicine.  · Medicines taken, including vitamins, herbs, eyedrops, over-the-counter medicines, and creams.  · Use of steroids (by mouth or creams).  · Previous problems with numbing medicines.  · History of bleeding problems or blood clots.  · Any recent colds or infections.  · Previous surgery.  · Other health problems, including diabetes and kidney problems.  · Possibility of pregnancy, if this applies.  · Any past pregnancies.  RISKS AND COMPLICATIONS   · Infection.  · Bleeding.  · Injury to surrounding organs.  · Anesthetic side effects.  · Failure of the procedure.  · Ectopic pregnancy.  · Future regret about having the procedure done.  BEFORE THE PROCEDURE  · Do not take aspirin or blood thinners a week before the procedure or as directed. This can cause bleeding.  · Do not eat or drink anything 6 to 8 hours before the procedure.  PROCEDURE   · You may be given a medicine to help you relax (sedative) before the procedure. You will be given a medicine to make you sleep (general anesthetic) during the procedure.  · A tube will be put down your throat to help your breath while under general anesthesia.  · Two small cuts (incisions) are made in the lower abdominal area and near the belly button.  · Your abdominal area will be inflated with a safe gas (carbon dioxide). This helps  give the surgeon room to operate, visualize, and helps the surgeon avoid other organs.  · A thin, lighted tube (laparoscope) with a camera attached is inserted into your abdomen through one of the incisions near the belly button. Other small instruments are also inserted through the other abdominal incision.  · The fallopian tubes are located and are either blocked with a ring, clip, or are burned (cauterized).  · After the fallopian tubes are blocked, the gas is released from the abdomen.  · The incisions will be closed with stitches (sutures), and a bandage may be placed over the incisions.  AFTER THE PROCEDURE   · You will rest in a recovery room for 1 4 hours until you are stable and doing well.  · You will also have some mild abdominal discomfort for 3 7 days. You will be given pain medicine to ease any discomfort.  · As long as there are no problems, you may be allowed to go home. Someone will need to drive you home and be with you for at least 24 hours once home.  · You may have some mild discomfort in the throat. This is from the tube placed in your throat while you were sleeping.  · You may experience discomfort in the shoulder area from some trapped air between the liver and diaphragm. This sensation is normal and will slowly go away on its own.  Document Released: 05/14/2000 Document 

## 2013-04-22 NOTE — Progress Notes (Signed)
U/S(27+0wk)-active fetus, EFW 2 lb 11 oz (74th%tile), fluid wnl SDP-6.9cm, anterior Gr 1 placenta, Cardiac OFT's images obtained today, FHR- 144 bpm, no obvious abnl noted, female fetus

## 2013-04-22 NOTE — Progress Notes (Signed)
[redacted]w[redacted]d. [id number]. Pt wearing back harness. Reports improvement in pregnancy pains. Here for PN2. U/S also done today. Denies h/o HTN or DM. BLT discussed. Pt to sign papers today. Pt's questions answered to apparent satisfaction.  This chart was scribed by Jenne Campus, Medical Scribe, for Dr. Mallory Shirk on 04/22/13 at 10:36 AM. This chart was reviewed by Dr. Mallory Shirk and is accurate.

## 2013-04-23 LAB — HIV ANTIBODY (ROUTINE TESTING W REFLEX): HIV: NONREACTIVE

## 2013-04-23 LAB — ANTIBODY SCREEN: Antibody Screen: NEGATIVE

## 2013-04-23 LAB — GLUCOSE TOLERANCE, 2 HOURS W/ 1HR
GLUCOSE, FASTING: 87 mg/dL (ref 70–99)
Glucose, 1 hour: 135 mg/dL (ref 70–170)
Glucose, 2 hour: 113 mg/dL (ref 70–139)

## 2013-04-23 LAB — HSV 2 ANTIBODY, IGG: HSV 2 GLYCOPROTEIN G AB, IGG: 6.65 IV — AB

## 2013-04-23 LAB — RPR

## 2013-04-27 ENCOUNTER — Telehealth: Payer: Self-pay | Admitting: Obstetrics and Gynecology

## 2013-04-27 NOTE — Telephone Encounter (Signed)
Pt informed of WNL Glucose tolerance test, + HSV 2. Pt also c/o lower pressure and lower back pain, +FM, no cramping. Pt states still wearing maternity belt. Pt encouraged to push fluids, rest as much as possible, continue to wear maternity belt, can take tylenol for lower back pain. If no improvement to call office back. Pt verbalized understanding.

## 2013-04-28 ENCOUNTER — Encounter: Payer: Self-pay | Admitting: Obstetrics and Gynecology

## 2013-04-29 ENCOUNTER — Telehealth: Payer: Self-pay | Admitting: *Deleted

## 2013-04-29 NOTE — Telephone Encounter (Signed)
Message copied by Doyne Keel on Wed Apr 29, 2013  9:39 AM ------      Message from: Jonnie Kind      Created: Tue Apr 28, 2013 11:23 PM       Add iron, ------

## 2013-04-29 NOTE — Telephone Encounter (Signed)
Pt informed to add iron supplement per Dr. Glo Herring.

## 2013-04-30 ENCOUNTER — Telehealth: Payer: Self-pay | Admitting: *Deleted

## 2013-04-30 NOTE — Telephone Encounter (Signed)
Spoke with pt. Pt states she is not sleeping. + pressure. No urinary symptoms. No problems with BM's. + baby movement. No contractions today, but thinks she has had some the last 2 days. No spotting. I recommended an appt tomorrow for eval but if worse throughout the night, go to MAU at [facility]. Pt voiced understanding. Call transferred to front desk to schedule an appt. [place]

## 2013-05-01 ENCOUNTER — Encounter: Payer: Self-pay | Admitting: Obstetrics & Gynecology

## 2013-05-01 ENCOUNTER — Ambulatory Visit (INDEPENDENT_AMBULATORY_CARE_PROVIDER_SITE_OTHER): Payer: Medicaid Other | Admitting: Obstetrics & Gynecology

## 2013-05-01 VITALS — BP 100/60 | Wt 236.0 lb

## 2013-05-01 DIAGNOSIS — O99019 Anemia complicating pregnancy, unspecified trimester: Secondary | ICD-10-CM

## 2013-05-01 DIAGNOSIS — Z331 Pregnant state, incidental: Secondary | ICD-10-CM

## 2013-05-01 DIAGNOSIS — O26899 Other specified pregnancy related conditions, unspecified trimester: Secondary | ICD-10-CM

## 2013-05-01 DIAGNOSIS — O99340 Other mental disorders complicating pregnancy, unspecified trimester: Secondary | ICD-10-CM

## 2013-05-01 DIAGNOSIS — R102 Pelvic and perineal pain: Principal | ICD-10-CM

## 2013-05-01 DIAGNOSIS — O09299 Supervision of pregnancy with other poor reproductive or obstetric history, unspecified trimester: Secondary | ICD-10-CM

## 2013-05-01 DIAGNOSIS — O99891 Other specified diseases and conditions complicating pregnancy: Secondary | ICD-10-CM

## 2013-05-01 DIAGNOSIS — O9989 Other specified diseases and conditions complicating pregnancy, childbirth and the puerperium: Secondary | ICD-10-CM

## 2013-05-01 DIAGNOSIS — Z1389 Encounter for screening for other disorder: Secondary | ICD-10-CM

## 2013-05-01 DIAGNOSIS — O358XX0 Maternal care for other (suspected) fetal abnormality and damage, not applicable or unspecified: Secondary | ICD-10-CM

## 2013-05-01 LAB — POCT URINALYSIS DIPSTICK
Blood, UA: NEGATIVE
GLUCOSE UA: NEGATIVE
Ketones, UA: NEGATIVE
Nitrite, UA: NEGATIVE

## 2013-05-01 NOTE — Progress Notes (Signed)
Pt seen due to pelvic pressure  Cervix LTC firm posterior fetal presenting part not in pelvis, recommend pregnancy band from [facility]  BP weight and urine results all reviewed and noted. Patient reports good fetal movement, denies any bleeding and no rupture of membranes symptoms or regular contractions. Patient is without complaints. All questions were answered.

## 2013-05-01 NOTE — Addendum Note (Signed)
Addended by: Linton Rump on: 05/01/2013 10:53 AM   Modules accepted: Orders

## 2013-05-10 ENCOUNTER — Emergency Department (HOSPITAL_COMMUNITY)
Admission: EM | Admit: 2013-05-10 | Discharge: 2013-05-10 | Disposition: A | Discharge status: Home / Self Care | Payer: Medicaid Other | Attending: Emergency Medicine | Admitting: Emergency Medicine

## 2013-05-10 ENCOUNTER — Encounter (HOSPITAL_COMMUNITY): Payer: Self-pay | Admitting: Emergency Medicine

## 2013-05-10 DIAGNOSIS — Z880 Allergy status to penicillin: Secondary | ICD-10-CM | POA: Insufficient documentation

## 2013-05-10 DIAGNOSIS — Z8669 Personal history of other diseases of the nervous system and sense organs: Secondary | ICD-10-CM | POA: Insufficient documentation

## 2013-05-10 DIAGNOSIS — Z8659 Personal history of other mental and behavioral disorders: Secondary | ICD-10-CM | POA: Insufficient documentation

## 2013-05-10 DIAGNOSIS — J45909 Unspecified asthma, uncomplicated: Secondary | ICD-10-CM | POA: Insufficient documentation

## 2013-05-10 DIAGNOSIS — Z862 Personal history of diseases of the blood and blood-forming organs and certain disorders involving the immune mechanism: Secondary | ICD-10-CM | POA: Insufficient documentation

## 2013-05-10 DIAGNOSIS — O26899 Other specified pregnancy related conditions, unspecified trimester: Secondary | ICD-10-CM

## 2013-05-10 DIAGNOSIS — Z91040 Latex allergy status: Secondary | ICD-10-CM | POA: Insufficient documentation

## 2013-05-10 DIAGNOSIS — IMO0002 Reserved for concepts with insufficient information to code with codable children: Secondary | ICD-10-CM | POA: Insufficient documentation

## 2013-05-10 DIAGNOSIS — R109 Unspecified abdominal pain: Secondary | ICD-10-CM | POA: Insufficient documentation

## 2013-05-10 DIAGNOSIS — Z79899 Other long term (current) drug therapy: Secondary | ICD-10-CM | POA: Insufficient documentation

## 2013-05-10 DIAGNOSIS — Z8639 Personal history of other endocrine, nutritional and metabolic disease: Secondary | ICD-10-CM | POA: Insufficient documentation

## 2013-05-10 DIAGNOSIS — O9989 Other specified diseases and conditions complicating pregnancy, childbirth and the puerperium: Secondary | ICD-10-CM | POA: Insufficient documentation

## 2013-05-10 DIAGNOSIS — Z8742 Personal history of other diseases of the female genital tract: Secondary | ICD-10-CM | POA: Insufficient documentation

## 2013-05-10 LAB — URINALYSIS, ROUTINE W REFLEX MICROSCOPIC
Bilirubin Urine: NEGATIVE
GLUCOSE, UA: NEGATIVE mg/dL
LEUKOCYTES UA: NEGATIVE
Nitrite: NEGATIVE
Specific Gravity, Urine: 1.025 (ref 1.005–1.030)
Urobilinogen, UA: 0.2 mg/dL (ref 0.0–1.0)
pH: 6.5 (ref 5.0–8.0)

## 2013-05-10 LAB — URINE MICROSCOPIC-ADD ON

## 2013-05-10 MED ORDER — NITROFURANTOIN MONOHYD MACRO 100 MG PO CAPS: 100.0000 mg | ORAL_CAPSULE | Freq: Two times a day (BID) | ORAL | Status: DC

## 2013-05-10 MED ORDER — SODIUM CHLORIDE 0.9 % IV BOLUS (SEPSIS)
1000.0000 mL | Freq: Once | INTRAVENOUS | Status: AC
  Administered 2013-05-10: 1000 mL via INTRAVENOUS

## 2013-05-10 NOTE — ED Notes (Signed)
Breanna Malone, Rapid Response RN at [facility] spoke with Dr. Glo Herring and was told OK to d/c fetal monitoring and to treat UTI and instruct to Follow up in office this week.

## 2013-05-10 NOTE — Progress Notes (Signed)
Taiked with [facility] RN and told her that I do not see any uc's. The fhr is reassuring. I will call Dr. Glo Herring and let him know the pt is being monitored.

## 2013-05-10 NOTE — ED Notes (Signed)
Pt reports has been having abd cramping since Friday.   Denies any vaginal bleeding, unsure if has had discharge.  Pt says this is her 7th pregnancy, has 5 living children, 1 miscarriage.    Reports is [redacted] weeks pregnant, due date is June 3.

## 2013-05-10 NOTE — Progress Notes (Signed)
Spoke to [facility] RN. Asked if we could stop Ms. Ruan's efm. Dr. Glo Herring on unit. Veiwed fhr tracing. Okay to dc pt's efm. Wants [facility] ED staff to trat pt's UTI and have her follow up in the office this week.

## 2013-05-10 NOTE — ED Notes (Signed)
Spoke with Stanton Kidney RN Rapid Respone RN at [facility] [employer] who advised that "everything looked good", advised that she would be contacted Dr Glo Herring to notify of pt being at [facility]. Dr Dina Rich notified,

## 2013-05-10 NOTE — Progress Notes (Signed)
Received call from [facility] RN. Pt is 294/[redacted] weeks pregnant, G7 P5 with c/o lower abd cramping since 05/08/13. Denies vaginal bleeding or leakage of fluid. Pt's cervix has been checked. She is FT and soft.

## 2013-05-10 NOTE — Discharge Instructions (Signed)
Abdominal Pain During Pregnancy °Abdominal pain is common in pregnancy. Most of the time, it does not cause harm. There are many causes of abdominal pain. Some causes are more serious than others. Some of the causes of abdominal pain in pregnancy are easily diagnosed. Occasionally, the diagnosis takes time to understand. Other times, the cause is not determined. Abdominal pain can be a sign that something is very wrong with the pregnancy, or the pain may have nothing to do with the pregnancy at all. For this reason, always tell your health care provider if you have any abdominal discomfort. °HOME CARE INSTRUCTIONS  °Monitor your abdominal pain for any changes. The following actions may help to alleviate any discomfort you are experiencing: °· Do not have sexual intercourse or put anything in your vagina until your symptoms go away completely. °· Get plenty of rest until your pain improves. °· Drink clear fluids if you feel nauseous. Avoid solid food as long as you are uncomfortable or nauseous. °· Only take over-the-counter or prescription medicine as directed by your health care provider. °· Keep all follow-up appointments with your health care provider. °SEEK IMMEDIATE MEDICAL CARE IF: °· You are bleeding, leaking fluid, or passing tissue from the vagina. °· You have increasing pain or cramping. °· You have persistent vomiting. °· You have painful or bloody urination. °· You have a fever. °· You notice a decrease in your baby's movements. °· You have extreme weakness or feel faint. °· You have shortness of breath, with or without abdominal pain. °· You develop a severe headache with abdominal pain. °· You have abnormal vaginal discharge with abdominal pain. °· You have persistent diarrhea. °· You have abdominal pain that continues even after rest, or gets worse. °MAKE SURE YOU:  °· Understand these instructions. °· Will watch your condition. °· Will get help right away if you are not doing well or get  worse. °Document Released: 02/05/2005 Document Revised: 11/26/2012 Document Reviewed: 09/04/2012 °ExitCare® Patient Information ©2014 [employer]. ° °

## 2013-05-10 NOTE — Progress Notes (Signed)
Spoke with Dr. Glo Herring and told him that the pt is a G7 P5 at 294/7 weeks with c/o lower abd pain since Friday 05/08/13. No vaginal bleeding or leaking of fluid. Pt is getting a fluid bolus and has a urine that is pending. Fhr is reassurring and I do not see any contractions. Pt was examined by Dr. Dina Rich . Her cervix is FT and soft. Says he will call [facility] to check on pt.

## 2013-05-10 NOTE — ED Notes (Addendum)
Pt c/o lower abd pressure, cramping that has been intermittent since Friday, denies any vaginal discharge, bleeding, water leaking, pt is 29 weeks 4 days pregnant pt of Dr Glo Herring, due date of 07/22/2013, pt reports "normal " amount of baby movement, fetal heart tones in 140's, Dr Dina Rich at bedside, pelvic exam performed by Dr Dina Rich, who advised pt is "fingertip and soft" [facility], pt placed on monitor

## 2013-05-10 NOTE — ED Provider Notes (Signed)
CSN: [id number]     Arrival date & time 05/10/13  1642 History  This chart was scribed for Merryl Hacker, MD by Zettie Pho, ED Scribe. This patient was seen in room [facility]01/[facility] and the patient's care was started at 5:20 PM.    Chief Complaint  Patient presents with  . Abdominal Pain  . pregnant    The history is provided by the patient. No language interpreter was used.   HPI Comments: Breanna Malone is a 33 y.o. Female with a history of right ovarian cyst and abnormal pap smear who is currently about [redacted] weeks pregnant (due date on 07/22/2013), patient is G#7P#5 who presents to the Emergency Department complaining of an intermittent pain, described as a cramping, to the suprapubic region of the abdomen onset 2 days ago. She states her current pain feels like contractions. Patient denies any complications with her current pregnancy and states that she was tested for gestational diabetes last week and was negative. She denies vaginal bleeding or discharge. Patient has allergies to Darvocet, hydrocodone-acetaminophen, Lortab, penicillins, Latex, and Percocet. Patient has a history of hypercholesterolemia, hyperlipoproteinemia, asthma.   Past Medical History  Diagnosis Date  . Asthma   . Bronchitis   . High cholesterol   . Abnormal Pap smear   . Elevated cholesterol   . Hyperlipidemia   . Hyperlipoproteinemia   . Chest pain   . Bipolar 1 disorder   . Right ovarian cyst 10/31/2012    Complex right ovarian cyst seen 8/18 in ER need f/u US  . Carpal tunnel syndrome    Past Surgical History  Procedure Laterality Date  . Hernia repair     Family History  Problem Relation Age of Onset  . Asthma Other   . Bronchitis Other   . Diabetes Mother   . Hypertension Mother   . Asthma Father   . Bronchitis Daughter   . Asthma Daughter   . Stroke Paternal Grandmother   . Heart attack Paternal Grandmother   . Asthma Son   . Bronchitis Son   . Asthma Daughter   . Bronchitis Daughter   .  Asthma Son   . Cancer Cousin    History  Substance Use Topics  . Smoking status: Never Smoker   . Smokeless tobacco: Never Used  . Alcohol Use: No   OB History   Grav Para Term Preterm Abortions TAB SAB Ect Mult Living   7 5 5  1  1   3      Review of Systems  Constitutional: Negative for fever.  Respiratory: Negative for cough, chest tightness and shortness of breath.   Cardiovascular: Negative for chest pain.  Gastrointestinal: Positive for abdominal pain. Negative for nausea and vomiting.  Genitourinary: Negative for dysuria, vaginal bleeding and vaginal discharge.  Musculoskeletal: Negative for back pain.  All other systems reviewed and are negative.    Allergies  Darvocet; Hydrocodone-acetaminophen; Lortab; Penicillins; Latex; and Percocet  Home Medications   Current Outpatient Rx  Name  Route  Sig  Dispense  Refill  . acetaminophen (TYLENOL) 500 MG tablet   Oral   Take 500 mg by mouth every 6 (six) hours as needed.         Marland Kitchen albuterol (PROVENTIL HFA;VENTOLIN HFA) 108 (90 BASE) MCG/ACT inhaler   Inhalation   Inhale 2 puffs into the lungs every 6 (six) hours as needed for wheezing.         . ferrous sulfate 325 (65 FE) MG tablet  Oral   Take 325 mg by mouth daily with breakfast.         . guaiFENesin (MUCINEX) 600 MG 12 hr tablet   Oral   Take 600 mg by mouth daily as needed for cough or to loosen phlegm.         . prenatal vitamin w/FE, FA (PRENATAL 1 + 1) 27-1 MG TABS tablet   Oral   Take 1 tablet by mouth daily at 12 noon.   30 each   12   . triamcinolone ointment (KENALOG) 0.5 %   Topical   Apply 1 application topically 2 (two) times daily.   30 g   0   . nitrofurantoin, macrocrystal-monohydrate, (MACROBID) 100 MG capsule   Oral   Take 1 capsule (100 mg total) by mouth 2 (two) times daily.   6 capsule   0    Triage Vitals: BP 103/51  Pulse 94  Temp(Src) 98.1 F (36.7 C) (Oral)  Resp 20  Ht 5\' 4"  (1.626 m)  Wt 237 lb 4.8 oz  (107.639 kg)  BMI 40.71 kg/m2  SpO2 100%  LMP 10/15/2012  Physical Exam  Nursing note and vitals reviewed. Constitutional: She is oriented to person, place, and time. She appears well-developed and well-nourished. No distress.  HENT:  Head: Normocephalic and atraumatic.  Cardiovascular: Normal rate and regular rhythm.   Pulmonary/Chest: Effort normal. No respiratory distress.  Abdominal: Soft. Bowel sounds are normal.  Gravid above umbilicus, no contractions palpated, no significant tenderness to palpation  Genitourinary: No vaginal discharge found.  Exam was performed with pt's permission. Chaperone (scribe) was present. The exam was performed with no discomfort or complications.   Cervix exam: Cervix high, soft, fingertip   Neurological: She is alert and oriented to person, place, and time.  Skin: Skin is warm and dry.  Psychiatric: She has a normal mood and affect.    ED Course  Procedures (including critical care time)  DIAGNOSTIC STUDIES: Oxygen Saturation is 100% on room air, normal by my interpretation.    COORDINATION OF CARE: 5:28 PM- Ordered UA. Will order IV fluids to manage symptoms. Will consult with [facility]. Discussed treatment plan with patient at bedside and patient verbalized agreement.     Labs Review Labs Reviewed  URINALYSIS, ROUTINE W REFLEX MICROSCOPIC - Abnormal; Notable for the following:    APPearance CLOUDY (*)    Hgb urine dipstick TRACE (*)    Ketones, ur TRACE (*)    Protein, ur TRACE (*)    All other components within normal limits  URINE MICROSCOPIC-ADD ON - Abnormal; Notable for the following:    Squamous Epithelial / LPF MANY (*)    Bacteria, UA FEW (*)    All other components within normal limits   Imaging Review No results found.   EKG Interpretation None      MDM   Final diagnoses:  Abdominal pain in pregnancy  UTI  Patient presents with abdominal cramping and pregnancy. She is nontoxic and no contractions  noted on tocometry. The heart rate in the 140s. Cervical exam reassuring. Patient was given a normal saline bolus. Urine appear slightly dirty. Will treat for urinary tract infection with macrobid.  Plan of care discussed with Dr. Glo Herring. Strip monitored at [facility] hospital without evidence of contractions.  After history, exam, and medical workup I feel the patient has been appropriately medically screened and is safe for discharge home. Pertinent diagnoses were discussed with the patient. Patient was given return precautions.  I personally performed the services described in this documentation, which was scribed in my presence. The recorded information has been reviewed and is accurate.    Merryl Hacker, MD 05/10/13 [phone]

## 2013-05-10 NOTE — ED Notes (Addendum)
Spoke with Stanton Kidney RN Rapid Response Team at [facility] who advised that pt "still looks good" on monitor, has seen only one contraction, Dr Dina Rich notified,

## 2013-05-20 ENCOUNTER — Encounter: Payer: Self-pay | Admitting: Obstetrics and Gynecology

## 2013-05-20 ENCOUNTER — Ambulatory Visit (INDEPENDENT_AMBULATORY_CARE_PROVIDER_SITE_OTHER): Payer: Medicaid Other | Admitting: Obstetrics and Gynecology

## 2013-05-20 VITALS — BP 120/62 | Wt 238.0 lb

## 2013-05-20 DIAGNOSIS — O99340 Other mental disorders complicating pregnancy, unspecified trimester: Secondary | ICD-10-CM

## 2013-05-20 DIAGNOSIS — E669 Obesity, unspecified: Secondary | ICD-10-CM

## 2013-05-20 DIAGNOSIS — O358XX0 Maternal care for other (suspected) fetal abnormality and damage, not applicable or unspecified: Secondary | ICD-10-CM

## 2013-05-20 DIAGNOSIS — Z331 Pregnant state, incidental: Secondary | ICD-10-CM

## 2013-05-20 DIAGNOSIS — O99019 Anemia complicating pregnancy, unspecified trimester: Secondary | ICD-10-CM

## 2013-05-20 DIAGNOSIS — Z3480 Encounter for supervision of other normal pregnancy, unspecified trimester: Secondary | ICD-10-CM

## 2013-05-20 DIAGNOSIS — O99891 Other specified diseases and conditions complicating pregnancy: Secondary | ICD-10-CM

## 2013-05-20 DIAGNOSIS — O09299 Supervision of pregnancy with other poor reproductive or obstetric history, unspecified trimester: Secondary | ICD-10-CM

## 2013-05-20 DIAGNOSIS — O9989 Other specified diseases and conditions complicating pregnancy, childbirth and the puerperium: Secondary | ICD-10-CM

## 2013-05-20 DIAGNOSIS — Z1389 Encounter for screening for other disorder: Secondary | ICD-10-CM

## 2013-05-20 DIAGNOSIS — O99210 Obesity complicating pregnancy, unspecified trimester: Secondary | ICD-10-CM

## 2013-05-20 LAB — POCT URINALYSIS DIPSTICK
Blood, UA: NEGATIVE
GLUCOSE UA: NEGATIVE
Ketones, UA: NEGATIVE
Leukocytes, UA: NEGATIVE
NITRITE UA: NEGATIVE

## 2013-05-20 NOTE — Progress Notes (Signed)
[redacted]w[redacted]d. G7P5A1. Hx asthma. No recent asthma related issues. +suprapubic abdominal pressure. No vaginal discharge or bleeding. Chaperone present for exam which was done with pt's permission. Pt's questions answered to apparent satisfaction.   This chart was scribed by Jenne Campus, Medical Scribe, for Dr. Mallory Shirk on 05/20/13 at 10:26 AM. This chart was reviewed by Dr. Mallory Shirk and is accurate.

## 2013-05-20 NOTE — Progress Notes (Signed)
Pt states that she is having a lot of pressure and pain and can't sleep.

## 2013-05-29 ENCOUNTER — Encounter (HOSPITAL_COMMUNITY): Payer: Self-pay | Admitting: *Deleted

## 2013-05-29 ENCOUNTER — Inpatient Hospital Stay (HOSPITAL_COMMUNITY)
Admission: AD | Admit: 2013-05-29 | Discharge: 2013-05-29 | Disposition: A | Discharge status: Home / Self Care | Payer: Medicaid Other | Source: Ambulatory Visit | Attending: Family Medicine | Admitting: Family Medicine

## 2013-05-29 DIAGNOSIS — E7800 Pure hypercholesterolemia, unspecified: Secondary | ICD-10-CM | POA: Insufficient documentation

## 2013-05-29 DIAGNOSIS — G5600 Carpal tunnel syndrome, unspecified upper limb: Secondary | ICD-10-CM | POA: Insufficient documentation

## 2013-05-29 DIAGNOSIS — O99891 Other specified diseases and conditions complicating pregnancy: Secondary | ICD-10-CM | POA: Insufficient documentation

## 2013-05-29 DIAGNOSIS — N949 Unspecified condition associated with female genital organs and menstrual cycle: Secondary | ICD-10-CM | POA: Insufficient documentation

## 2013-05-29 DIAGNOSIS — E785 Hyperlipidemia, unspecified: Secondary | ICD-10-CM | POA: Insufficient documentation

## 2013-05-29 DIAGNOSIS — O9989 Other specified diseases and conditions complicating pregnancy, childbirth and the puerperium: Principal | ICD-10-CM

## 2013-05-29 DIAGNOSIS — R109 Unspecified abdominal pain: Secondary | ICD-10-CM | POA: Insufficient documentation

## 2013-05-29 LAB — URINE MICROSCOPIC-ADD ON

## 2013-05-29 LAB — URINALYSIS, ROUTINE W REFLEX MICROSCOPIC
Bilirubin Urine: NEGATIVE
Glucose, UA: NEGATIVE mg/dL
Ketones, ur: NEGATIVE mg/dL
Leukocytes, UA: NEGATIVE
NITRITE: NEGATIVE
PH: 6 (ref 5.0–8.0)
Protein, ur: NEGATIVE mg/dL
SPECIFIC GRAVITY, URINE: 1.015 (ref 1.005–1.030)
UROBILINOGEN UA: 0.2 mg/dL (ref 0.0–1.0)

## 2013-05-29 NOTE — Discharge Instructions (Signed)
Abdominal Pain During Pregnancy  Belly (abdominal) pain is common during pregnancy. Most of the time, it is not a serious problem. Other times, it can be a sign that something is wrong with the pregnancy. Always tell your doctor if you have belly pain.  HOME CARE  Monitor your belly pain for any changes. The following actions may help you feel better:  · Do not have sex (intercourse) or put anything in your vagina until you feel better.  · Rest until your pain stops.  · Drink clear fluids if you feel sick to your stomach (nauseous). Do not eat solid food until you feel better.  · Only take medicine as told by your doctor.  · Keep all doctor visits as told.  GET HELP RIGHT AWAY IF:   · You are bleeding, leaking fluid, or pieces of tissue come out of your vagina.  · You have more pain or cramping.  · You keep throwing up (vomiting).  · You have pain when you pee (urinate) or have blood in your pee.  · You have a fever.  · You do not feel your baby moving as much.  · You feel very weak or feel like passing out.  · You have trouble breathing, with or without belly pain.  · You have a very bad headache and belly pain.  · You have fluid leaking from your vagina and belly pain.  · You keep having watery poop (diarrhea).  · Your belly pain does not go away after resting, or the pain gets worse.  MAKE SURE YOU:   · Understand these instructions.  · Will watch your condition.  · Will get help right away if you are not doing well or get worse.  Document Released: 01/24/2009 Document Revised: 10/08/2012 Document Reviewed: 09/04/2012  ExitCare® Patient Information ©2014 [employer].

## 2013-05-29 NOTE — MAU Note (Signed)
Patient states that last night she hit a pole on the left side of her abdomen. Has been having abdominal pain and pressure. Denies leaking or bleeding and reports good fetal movement.
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
## 2013-05-29 NOTE — MAU Provider Note (Signed)
Attestation of Attending Supervision of Advanced Practitioner (PA/CNM/NP): Evaluation and management procedures were performed by the Advanced Practitioner under my supervision and collaboration.  I have reviewed the Advanced Practitioner's note and chart, and I agree with the management and plan.  Jacob Stinson, DO Attending Physician Faculty Practice, [facility]  

## 2013-06-03 ENCOUNTER — Ambulatory Visit (INDEPENDENT_AMBULATORY_CARE_PROVIDER_SITE_OTHER): Payer: Medicaid Other | Admitting: Obstetrics and Gynecology

## 2013-06-03 ENCOUNTER — Encounter: Payer: Self-pay | Admitting: Obstetrics and Gynecology

## 2013-06-03 VITALS — BP 120/72 | Wt 237.0 lb

## 2013-06-03 DIAGNOSIS — Z331 Pregnant state, incidental: Secondary | ICD-10-CM

## 2013-06-03 DIAGNOSIS — O99891 Other specified diseases and conditions complicating pregnancy: Secondary | ICD-10-CM

## 2013-06-03 DIAGNOSIS — O09299 Supervision of pregnancy with other poor reproductive or obstetric history, unspecified trimester: Secondary | ICD-10-CM

## 2013-06-03 DIAGNOSIS — F319 Bipolar disorder, unspecified: Secondary | ICD-10-CM

## 2013-06-03 DIAGNOSIS — O9989 Other specified diseases and conditions complicating pregnancy, childbirth and the puerperium: Secondary | ICD-10-CM

## 2013-06-03 DIAGNOSIS — E669 Obesity, unspecified: Secondary | ICD-10-CM

## 2013-06-03 DIAGNOSIS — O99210 Obesity complicating pregnancy, unspecified trimester: Secondary | ICD-10-CM

## 2013-06-03 DIAGNOSIS — Z1389 Encounter for screening for other disorder: Secondary | ICD-10-CM

## 2013-06-03 DIAGNOSIS — O358XX0 Maternal care for other (suspected) fetal abnormality and damage, not applicable or unspecified: Secondary | ICD-10-CM

## 2013-06-03 DIAGNOSIS — O99019 Anemia complicating pregnancy, unspecified trimester: Secondary | ICD-10-CM

## 2013-06-03 DIAGNOSIS — Z3490 Encounter for supervision of normal pregnancy, unspecified, unspecified trimester: Secondary | ICD-10-CM

## 2013-06-03 DIAGNOSIS — O99340 Other mental disorders complicating pregnancy, unspecified trimester: Secondary | ICD-10-CM

## 2013-06-03 LAB — POCT URINALYSIS DIPSTICK
Glucose, UA: NEGATIVE
Ketones, UA: NEGATIVE
Leukocytes, UA: NEGATIVE
Nitrite, UA: NEGATIVE
RBC UA: NEGATIVE
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
## 2013-06-03 NOTE — Progress Notes (Signed)
Pt denies any problems or concerns at this time.  

## 2013-06-08 ENCOUNTER — Ambulatory Visit (INDEPENDENT_AMBULATORY_CARE_PROVIDER_SITE_OTHER): Payer: Medicaid Other | Admitting: Obstetrics & Gynecology

## 2013-06-08 ENCOUNTER — Encounter: Payer: Self-pay | Admitting: Obstetrics & Gynecology

## 2013-06-08 VITALS — BP 110/60 | Wt 238.0 lb

## 2013-06-08 DIAGNOSIS — E669 Obesity, unspecified: Secondary | ICD-10-CM

## 2013-06-08 DIAGNOSIS — O358XX0 Maternal care for other (suspected) fetal abnormality and damage, not applicable or unspecified: Secondary | ICD-10-CM

## 2013-06-08 DIAGNOSIS — Z331 Pregnant state, incidental: Secondary | ICD-10-CM

## 2013-06-08 DIAGNOSIS — O99210 Obesity complicating pregnancy, unspecified trimester: Secondary | ICD-10-CM

## 2013-06-08 DIAGNOSIS — O09299 Supervision of pregnancy with other poor reproductive or obstetric history, unspecified trimester: Secondary | ICD-10-CM

## 2013-06-08 DIAGNOSIS — Z1389 Encounter for screening for other disorder: Secondary | ICD-10-CM

## 2013-06-08 DIAGNOSIS — O99340 Other mental disorders complicating pregnancy, unspecified trimester: Secondary | ICD-10-CM

## 2013-06-08 DIAGNOSIS — O99019 Anemia complicating pregnancy, unspecified trimester: Secondary | ICD-10-CM

## 2013-06-08 DIAGNOSIS — O9989 Other specified diseases and conditions complicating pregnancy, childbirth and the puerperium: Secondary | ICD-10-CM

## 2013-06-08 DIAGNOSIS — O99891 Other specified diseases and conditions complicating pregnancy: Secondary | ICD-10-CM

## 2013-06-08 LAB — POCT URINALYSIS DIPSTICK
Glucose, UA: NEGATIVE
KETONES UA: NEGATIVE
Leukocytes, UA: NEGATIVE
Nitrite, UA: NEGATIVE
Protein, UA: 1

## 2013-06-08 MED ORDER — CEPHALEXIN 500 MG PO CAPS: 500.0000 mg | ORAL_CAPSULE | Freq: Three times a day (TID) | ORAL | Status: DC

## 2013-06-08 NOTE — Progress Notes (Signed)
Right ear otitis, keflex 500 tid, no history of allergy to keflex  BP weight and urine results all reviewed and noted. Patient reports good fetal movement, denies any bleeding and no rupture of membranes symptoms or regular contractions. Patient is without complaints. All questions were answered.

## 2013-06-12 ENCOUNTER — Inpatient Hospital Stay (HOSPITAL_COMMUNITY)
Admission: AD | Admit: 2013-06-12 | Discharge: 2013-06-12 | Disposition: A | Discharge status: Home / Self Care | Payer: Medicaid Other | Source: Ambulatory Visit | Attending: Obstetrics & Gynecology | Admitting: Obstetrics & Gynecology

## 2013-06-12 ENCOUNTER — Encounter (HOSPITAL_COMMUNITY): Payer: Self-pay | Admitting: *Deleted

## 2013-06-12 DIAGNOSIS — O0990 Supervision of high risk pregnancy, unspecified, unspecified trimester: Secondary | ICD-10-CM

## 2013-06-12 DIAGNOSIS — O09899 Supervision of other high risk pregnancies, unspecified trimester: Secondary | ICD-10-CM | POA: Insufficient documentation

## 2013-06-12 DIAGNOSIS — O4700 False labor before 37 completed weeks of gestation, unspecified trimester: Secondary | ICD-10-CM | POA: Insufficient documentation

## 2013-06-12 DIAGNOSIS — O479 False labor, unspecified: Secondary | ICD-10-CM

## 2013-06-12 LAB — URINALYSIS, ROUTINE W REFLEX MICROSCOPIC
Bilirubin Urine: NEGATIVE
Glucose, UA: NEGATIVE mg/dL
Ketones, ur: 15 mg/dL — AB
LEUKOCYTES UA: NEGATIVE
Nitrite: NEGATIVE
PH: 6.5 (ref 5.0–8.0)
Protein, ur: NEGATIVE mg/dL
Specific Gravity, Urine: 1.01 (ref 1.005–1.030)
Urobilinogen, UA: 0.2 mg/dL (ref 0.0–1.0)

## 2013-06-12 LAB — URINE MICROSCOPIC-ADD ON
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
## 2013-06-12 NOTE — Discharge Instructions (Signed)

## 2013-06-12 NOTE — MAU Note (Signed)
Pt reports she has been having ctx on and off since Monday. Denies SROM or bleeding. Good fetal movement reported.

## 2013-06-17 ENCOUNTER — Encounter: Payer: Medicaid Other | Admitting: Obstetrics and Gynecology

## 2013-06-22 ENCOUNTER — Encounter: Payer: Self-pay | Admitting: Obstetrics and Gynecology

## 2013-06-22 ENCOUNTER — Ambulatory Visit (INDEPENDENT_AMBULATORY_CARE_PROVIDER_SITE_OTHER): Payer: Medicaid Other | Admitting: Obstetrics and Gynecology

## 2013-06-22 VITALS — BP 124/60 | Wt 238.0 lb

## 2013-06-22 DIAGNOSIS — O99340 Other mental disorders complicating pregnancy, unspecified trimester: Secondary | ICD-10-CM

## 2013-06-22 DIAGNOSIS — O99019 Anemia complicating pregnancy, unspecified trimester: Secondary | ICD-10-CM

## 2013-06-22 DIAGNOSIS — O99891 Other specified diseases and conditions complicating pregnancy: Secondary | ICD-10-CM

## 2013-06-22 DIAGNOSIS — Z1389 Encounter for screening for other disorder: Secondary | ICD-10-CM

## 2013-06-22 DIAGNOSIS — O09299 Supervision of pregnancy with other poor reproductive or obstetric history, unspecified trimester: Secondary | ICD-10-CM

## 2013-06-22 DIAGNOSIS — O99210 Obesity complicating pregnancy, unspecified trimester: Secondary | ICD-10-CM

## 2013-06-22 DIAGNOSIS — O9989 Other specified diseases and conditions complicating pregnancy, childbirth and the puerperium: Secondary | ICD-10-CM

## 2013-06-22 DIAGNOSIS — O358XX0 Maternal care for other (suspected) fetal abnormality and damage, not applicable or unspecified: Secondary | ICD-10-CM

## 2013-06-22 DIAGNOSIS — Z331 Pregnant state, incidental: Secondary | ICD-10-CM

## 2013-06-22 DIAGNOSIS — E669 Obesity, unspecified: Secondary | ICD-10-CM

## 2013-06-22 DIAGNOSIS — Z3490 Encounter for supervision of normal pregnancy, unspecified, unspecified trimester: Secondary | ICD-10-CM

## 2013-06-22 LAB — POCT URINALYSIS DIPSTICK
GLUCOSE UA: NEGATIVE
Ketones, UA: NEGATIVE
LEUKOCYTES UA: NEGATIVE
NITRITE UA: NEGATIVE

## 2013-06-22 NOTE — Progress Notes (Signed)
[redacted]w[redacted]d [id number] female presents for prenatal visit today. Patient states she is having contractions and experiencing pressure. She was seen 1 week ago at [facility] and was diagnosed with braxton-hicks contractions. Signed BTL papers in the hospital. She denies vaginal bleeding, vaginal discharge. Cervix is long and closed. Patient desires IOL as soon as possible but informed patient that this is not possible or desirable for baby.

## 2013-06-29 ENCOUNTER — Encounter: Payer: Self-pay | Admitting: Obstetrics and Gynecology

## 2013-06-29 ENCOUNTER — Ambulatory Visit (INDEPENDENT_AMBULATORY_CARE_PROVIDER_SITE_OTHER): Payer: Medicaid Other | Admitting: Obstetrics and Gynecology

## 2013-06-29 VITALS — BP 122/64 | Wt 238.8 lb

## 2013-06-29 DIAGNOSIS — Z1389 Encounter for screening for other disorder: Secondary | ICD-10-CM

## 2013-06-29 DIAGNOSIS — A630 Anogenital (venereal) warts: Secondary | ICD-10-CM | POA: Insufficient documentation

## 2013-06-29 DIAGNOSIS — O09299 Supervision of pregnancy with other poor reproductive or obstetric history, unspecified trimester: Secondary | ICD-10-CM

## 2013-06-29 DIAGNOSIS — Z3480 Encounter for supervision of other normal pregnancy, unspecified trimester: Secondary | ICD-10-CM

## 2013-06-29 DIAGNOSIS — O9989 Other specified diseases and conditions complicating pregnancy, childbirth and the puerperium: Secondary | ICD-10-CM

## 2013-06-29 DIAGNOSIS — O99340 Other mental disorders complicating pregnancy, unspecified trimester: Secondary | ICD-10-CM

## 2013-06-29 DIAGNOSIS — O358XX0 Maternal care for other (suspected) fetal abnormality and damage, not applicable or unspecified: Secondary | ICD-10-CM

## 2013-06-29 DIAGNOSIS — O98319 Other infections with a predominantly sexual mode of transmission complicating pregnancy, unspecified trimester: Secondary | ICD-10-CM

## 2013-06-29 DIAGNOSIS — O99891 Other specified diseases and conditions complicating pregnancy: Secondary | ICD-10-CM

## 2013-06-29 DIAGNOSIS — Z3490 Encounter for supervision of normal pregnancy, unspecified, unspecified trimester: Secondary | ICD-10-CM

## 2013-06-29 DIAGNOSIS — Z331 Pregnant state, incidental: Secondary | ICD-10-CM

## 2013-06-29 DIAGNOSIS — O98519 Other viral diseases complicating pregnancy, unspecified trimester: Secondary | ICD-10-CM

## 2013-06-29 DIAGNOSIS — O99019 Anemia complicating pregnancy, unspecified trimester: Secondary | ICD-10-CM

## 2013-06-29 LAB — POCT URINALYSIS DIPSTICK
GLUCOSE UA: NEGATIVE
Ketones, UA: NEGATIVE
LEUKOCYTES UA: NEGATIVE
Nitrite, UA: NEGATIVE

## 2013-06-29 LAB — OB RESULTS CONSOLE GBS: GBS: NEGATIVE

## 2013-06-29 LAB — OB RESULTS CONSOLE GC/CHLAMYDIA
CHLAMYDIA, DNA PROBE: NEGATIVE
Gonorrhea: NEGATIVE

## 2013-06-29 NOTE — Progress Notes (Signed)
[redacted]w[redacted]d [id number] presents for routine prenatal visit today. Patient reports having pain and pressure. She was seen on 4/24 and was diagnosed with braxton-hicks contractions. She denies vaginal bleeding or vaginal discharge. She desires IOL as soon as possible but informed patient that this is not desirable for baby.   Pelvic exam: Cervix is closed.  A: vulvar condyloma  P: Rx for TCA and will treat next week.

## 2013-06-30 LAB — GC/CHLAMYDIA PROBE AMP
CT PROBE, AMP APTIMA: NEGATIVE
GC PROBE AMP APTIMA: NEGATIVE

## 2013-07-01 LAB — CULTURE, STREPTOCOCCUS GRP B W/SUSCEPT

## 2013-07-03 ENCOUNTER — Encounter: Payer: Self-pay | Admitting: Obstetrics and Gynecology

## 2013-07-06 ENCOUNTER — Telehealth: Payer: Self-pay | Admitting: *Deleted

## 2013-07-06 ENCOUNTER — Ambulatory Visit (INDEPENDENT_AMBULATORY_CARE_PROVIDER_SITE_OTHER): Payer: Medicaid Other | Admitting: Women's Health

## 2013-07-06 ENCOUNTER — Encounter: Payer: Self-pay | Admitting: Women's Health

## 2013-07-06 VITALS — BP 124/58 | Wt 237.6 lb

## 2013-07-06 DIAGNOSIS — O98319 Other infections with a predominantly sexual mode of transmission complicating pregnancy, unspecified trimester: Secondary | ICD-10-CM

## 2013-07-06 DIAGNOSIS — O0990 Supervision of high risk pregnancy, unspecified, unspecified trimester: Secondary | ICD-10-CM

## 2013-07-06 DIAGNOSIS — O98313 Other infections with a predominantly sexual mode of transmission complicating pregnancy, third trimester: Secondary | ICD-10-CM

## 2013-07-06 DIAGNOSIS — O98519 Other viral diseases complicating pregnancy, unspecified trimester: Secondary | ICD-10-CM

## 2013-07-06 DIAGNOSIS — O09899 Supervision of other high risk pregnancies, unspecified trimester: Secondary | ICD-10-CM

## 2013-07-06 DIAGNOSIS — A630 Anogenital (venereal) warts: Secondary | ICD-10-CM

## 2013-07-06 DIAGNOSIS — Z331 Pregnant state, incidental: Secondary | ICD-10-CM

## 2013-07-06 DIAGNOSIS — Z1389 Encounter for screening for other disorder: Secondary | ICD-10-CM

## 2013-07-06 LAB — POCT URINALYSIS DIPSTICK
Glucose, UA: NEGATIVE
Ketones, UA: NEGATIVE
Leukocytes, UA: NEGATIVE
Nitrite, UA: NEGATIVE

## 2013-07-06 MED ORDER — BUTALBITAL-APAP-CAFFEINE 50-325-40 MG PO TABS: 1.0000 | ORAL_TABLET | Freq: Four times a day (QID) | ORAL | Status: DC | PRN

## 2013-07-06 NOTE — Progress Notes (Signed)
Reports good fm. Denies lof, vb, uti s/s.  Lots of braxton hick's. Requests SVE: 1.5/th/-3, vtx. Brought TCA in that was rx'd last week. 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  Reviewed labor s/s, fkc.  All questions answered. F/U in 1wk for visit.

## 2013-07-06 NOTE — Telephone Encounter (Signed)
Pt c/o headache ever since she left her appt with Knute Neu, CNM this am. Pt states tylenol not helping.

## 2013-07-06 NOTE — Patient Instructions (Signed)
[place] Pediatricians:  [place] [place] [phone]                 [place] [phone] (usually doesn't accept new patients unless you have family there already, you are always welcome to call and ask)             [facility] ([address]) [phone]   [facility] Pediatricians:   [place]: [phone]  [facility]: [phone]   Call the office [phone]) or go to [facility] if:  You begin to have strong, frequent contractions  Your water breaks.  Sometimes it is a big gush of fluid, sometimes it is just a trickle that keeps getting your panties wet or running down your legs  You have vaginal bleeding.  It is normal to have a small amount of spotting if your cervix was checked.   You don't feel your baby moving like normal.  If you don't, get you something to eat and drink and lay down and focus on feeling your baby move.  You should feel at least 10 movements in 2 hours.  If you don't, you should call the office or go to [place] Cont[facility] Pregnancy is commonly associated with contractions of the uterus throughout the pregnancy. Towards the end of pregnancy (32 to 34 weeks), these contractions [facility]el Holter) can develop more often and may become more forceful. This is not true labor because these contractions do not result in opening (dilatation) and thinning of the cervix. They are sometimes difficult to tell apart from true labor because these contractions can be forceful and people have different pain tolerances. You should not feel embarrassed if you go to the hospital with false labor. Sometimes, the only way to tell if you are in true labor is for your caregiver to follow the changes in the cervix. How to tell the difference between true and false labor:  False labor.  The contractions of  false labor are usually shorter, irregular and not as hard as those of true labor.  They are often felt in the front of the lower abdomen and in the groin.  They may leave with walking around or changing positions while lying down.  They get weaker and are shorter lasting as time goes on.  These contractions are usually irregular.  They do not usually become progressively stronger, regular and closer together as with true labor.  True labor.  Contractions in true labor last 30 to 70 seconds, become very regular, usually become more intense, and increase in frequency.  They do not go away with walking.  The discomfort is usually felt in the top of the uterus and spreads to the lower abdomen and low back.  True labor can be determined by your caregiver with an exam. This will show that the cervix is dilating and getting thinner. If there are no prenatal problems or other health problems associated with the pregnancy, it is completely safe to be sent home with false labor and await the onset of true labor. HOME CARE INSTRUCTIONS   Keep up with your usual exercises and instructions.  Take medications as directed.  Keep your regular prenatal appointment.  Eat and drink lightly if you think you are going into labor.  If BH contractions are making you uncomfortable:  Change your activity position from lying down  or resting to walking/walking to resting.  Sit and rest in a tub of warm water.  Drink 2 to 3 glasses of water. Dehydration may cause B-H contractions.  Do slow and deep breathing several times an hour. SEEK IMMEDIATE MEDICAL CARE IF:   Your contractions continue to become stronger, more regular, and closer together.  You have a gushing, burst or leaking of fluid from the vagina.  An oral temperature above 102 F (38.9 C) develops.  You have passage of blood-tinged mucus.  You develop vaginal bleeding.  You develop continuous belly (abdominal) pain.  You have  low back pain that you never had before.  You feel the baby's head pushing down causing pelvic pressure.  The baby is not moving as much as it used to. Document Released: 02/05/2005 Document Revised: 04/30/2011 Document Reviewed: 11/17/2012 [facility] Patient Information 2014 [place], [place].

## 2013-07-10 ENCOUNTER — Inpatient Hospital Stay (HOSPITAL_COMMUNITY)
Admission: AD | Admit: 2013-07-10 | Discharge: 2013-07-10 | Disposition: A | Discharge status: Home / Self Care | Payer: Medicaid Other | Source: Ambulatory Visit | Attending: Obstetrics & Gynecology | Admitting: Obstetrics & Gynecology

## 2013-07-10 ENCOUNTER — Encounter (HOSPITAL_COMMUNITY): Payer: Self-pay | Admitting: *Deleted

## 2013-07-10 DIAGNOSIS — O479 False labor, unspecified: Secondary | ICD-10-CM | POA: Insufficient documentation

## 2013-07-10 DIAGNOSIS — Z3480 Encounter for supervision of other normal pregnancy, unspecified trimester: Secondary | ICD-10-CM

## 2013-07-10 DIAGNOSIS — O368190 Decreased fetal movements, unspecified trimester, not applicable or unspecified: Secondary | ICD-10-CM | POA: Insufficient documentation

## 2013-07-10 NOTE — MAU Provider Note (Signed)
Attestation of Attending Supervision of Fellow: Evaluation and management procedures were performed by the Fellow under my supervision and collaboration.  I have reviewed the Fellow's note and chart, and I agree with the management and plan.    

## 2013-07-10 NOTE — MAU Note (Signed)
Patient states she has been having a lot of pain and pressure for 2 days. Has fetal movement but less than usual. Denies bleeding or leaking.

## 2013-07-10 NOTE — Discharge Instructions (Signed)
Fetal Movement Counts Patient Name: __________________________________________________ Patient Due Date: ____________________ Performing a fetal movement count is highly recommended in high-risk pregnancies, but it is good for every pregnant woman to do. Your caregiver may ask you to start counting fetal movements at 28 weeks of the pregnancy. Fetal movements often increase:  After eating a full meal.  After physical activity.  After eating or drinking something sweet or cold.  At rest. Pay attention to when you feel the baby is most active. This will help you notice a pattern of your baby's sleep and wake cycles and what factors contribute to an increase in fetal movement. It is important to perform a fetal movement count at the same time each day when your baby is normally most active.  HOW TO COUNT FETAL MOVEMENTS 1. Find a quiet and comfortable area to sit or lie down on your left side. Lying on your left side provides the best blood and oxygen circulation to your baby. 2. Write down the day and time on a sheet of paper or in a journal. 3. Start counting kicks, flutters, swishes, rolls, or jabs in a 2 hour period. You should feel at least 10 movements within 2 hours. 4. If you do not feel 10 movements in 2 hours, wait 2 3 hours and count again. Look for a change in the pattern or not enough counts in 2 hours. SEEK MEDICAL CARE IF:  You feel less than 10 counts in 2 hours, tried twice.  There is no movement in over an hour.  The pattern is changing or taking longer each day to reach 10 counts in 2 hours.  You feel the baby is not moving as he or she usually does. Date: ____________ Movements: ____________ Start time: ____________ Breanna Malone time: ____________  Date: ____________ Movements: ____________ Start time: ____________ Breanna Malone time: ____________ Date: ____________ Movements: ____________ Start time: ____________ Breanna Malone time: ____________ Date: ____________ Movements: ____________  Start time: ____________ Breanna Malone time: ____________ Date: ____________ Movements: ____________ Start time: ____________ Breanna Malone time: ____________ Date: ____________ Movements: ____________ Start time: ____________ Breanna Malone time: ____________ Date: ____________ Movements: ____________ Start time: ____________ Breanna Malone time: ____________ Date: ____________ Movements: ____________ Start time: ____________ Breanna Malone time: ____________  Date: ____________ Movements: ____________ Start time: ____________ Breanna Malone time: ____________ Date: ____________ Movements: ____________ Start time: ____________ Breanna Malone time: ____________ Date: ____________ Movements: ____________ Start time: ____________ Breanna Malone time: ____________ Date: ____________ Movements: ____________ Start time: ____________ Breanna Malone time: ____________ Date: ____________ Movements: ____________ Start time: ____________ Breanna Malone time: ____________ Date: ____________ Movements: ____________ Start time: ____________ Breanna Malone time: ____________ Date: ____________ Movements: ____________ Start time: ____________ Breanna Malone time: ____________  Date: ____________ Movements: ____________ Start time: ____________ Breanna Malone time: ____________ Date: ____________ Movements: ____________ Start time: ____________ Breanna Malone time: ____________ Date: ____________ Movements: ____________ Start time: ____________ Breanna Malone time: ____________ Date: ____________ Movements: ____________ Start time: ____________ Breanna Malone time: ____________ Date: ____________ Movements: ____________ Start time: ____________ Breanna Malone time: ____________ Date: ____________ Movements: ____________ Start time: ____________ Breanna Malone time: ____________ Date: ____________ Movements: ____________ Start time: ____________ Breanna Malone time: ____________  Date: ____________ Movements: ____________ Start time: ____________ Breanna Malone time: ____________ Date: ____________ Movements: ____________ Start time: ____________ Breanna Malone time:  ____________ Date: ____________ Movements: ____________ Start time: ____________ Breanna Malone time: ____________ Date: ____________ Movements: ____________ Start time: ____________ Breanna Malone time: ____________ Date: ____________ Movements: ____________ Start time: ____________ Breanna Malone time: ____________ Date: ____________ Movements: ____________ Start time: ____________ Breanna Malone time: ____________ Date: ____________ Movements: ____________ Start time: ____________ Breanna Malone time: ____________  Date: ____________ Movements: ____________ Start time: ____________ Breanna Malone  time: ____________ Date: ____________ Movements: ____________ Start time: ____________ Breanna Malone time: ____________ Date: ____________ Movements: ____________ Start time: ____________ Breanna Malone time: ____________ Date: ____________ Movements: ____________ Start time: ____________ Breanna Malone time: ____________ Date: ____________ Movements: ____________ Start time: ____________ Breanna Malone time: ____________ Date: ____________ Movements: ____________ Start time: ____________ Breanna Malone time: ____________ Date: ____________ Movements: ____________ Start time: ____________ Breanna Malone time: ____________  Date: ____________ Movements: ____________ Start time: ____________ Breanna Malone time: ____________ Date: ____________ Movements: ____________ Start time: ____________ Breanna Malone time: ____________ Date: ____________ Movements: ____________ Start time: ____________ Breanna Malone time: ____________ Date: ____________ Movements: ____________ Start time: ____________ Breanna Malone time: ____________ Date: ____________ Movements: ____________ Start time: ____________ Breanna Malone time: ____________ Date: ____________ Movements: ____________ Start time: ____________ Breanna Malone time: ____________ Date: ____________ Movements: ____________ Start time: ____________ Breanna Malone time: ____________  Date: ____________ Movements: ____________ Start time: ____________ Breanna Malone time: ____________ Date: ____________ Movements:  ____________ Start time: ____________ Breanna Malone time: ____________ Date: ____________ Movements: ____________ Start time: ____________ Breanna Malone time: ____________ Date: ____________ Movements: ____________ Start time: ____________ Breanna Malone time: ____________ Date: ____________ Movements: ____________ Start time: ____________ Breanna Malone time: ____________ Date: ____________ Movements: ____________ Start time: ____________ Breanna Malone time: ____________ Date: ____________ Movements: ____________ Start time: ____________ Breanna Malone time: ____________  Date: ____________ Movements: ____________ Start time: ____________ Breanna Malone time: ____________ Date: ____________ Movements: ____________ Start time: ____________ Breanna Malone time: ____________ Date: ____________ Movements: ____________ Start time: ____________ Breanna Malone time: ____________ Date: ____________ Movements: ____________ Start time: ____________ Breanna Malone time: ____________ Date: ____________ Movements: ____________ Start time: ____________ Breanna Malone time: ____________ Date: ____________ Movements: ____________ Start time: ____________ Breanna Malone time: ____________ Document Released: 03/07/2006 Document Revised: 01/23/2012 Document Reviewed: 12/03/2011 ExitCare Patient Information 2014 [place]. Braxton Hicks Contractions Pregnancy is commonly associated with contractions of the uterus throughout the pregnancy. Towards the end of pregnancy (32 to 34 weeks), these contractions [facility] Holter) can develop more often and may become more forceful. This is not true labor because these contractions do not result in opening (dilatation) and thinning of the cervix. They are sometimes difficult to tell apart from true labor because these contractions can be forceful and people have different pain tolerances. You should not feel embarrassed if you go to the hospital with false labor. Sometimes, the only way to tell if you are in true labor is for your caregiver to follow the changes in  the cervix. How to tell the difference between true and false labor:  False labor.  The contractions of false labor are usually shorter, irregular and not as hard as those of true labor.  They are often felt in the front of the lower abdomen and in the groin.  They may leave with walking around or changing positions while lying down.  They get weaker and are shorter lasting as time goes on.  These contractions are usually irregular.  They do not usually become progressively stronger, regular and closer together as with true labor.  True labor.  Contractions in true labor last 30 to 70 seconds, become very regular, usually become more intense, and increase in frequency.  They do not go away with walking.  The discomfort is usually felt in the top of the uterus and spreads to the lower abdomen and low back.  True labor can be determined by your caregiver with an exam. This will show that the cervix is dilating and getting thinner. If there are no prenatal problems or other health problems associated with the pregnancy, it is completely safe to be sent home with false labor and await the onset of true labor.  true labor. °HOME CARE INSTRUCTIONS  °· Keep up with your usual exercises and instructions. °· Take medications as directed. °· Keep your regular prenatal appointment. °· Eat and drink lightly if you think you are going into labor. °· If BH contractions are making you uncomfortable: °· Change your activity position from lying down or resting to walking/walking to resting. °· Sit and rest in a tub of warm water. °· Drink 2 to 3 glasses of water. Dehydration may cause B-H contractions. °· Do slow and deep breathing several times an hour. °SEEK IMMEDIATE MEDICAL CARE IF:  °· Your contractions continue to become stronger, more regular, and closer together. °· You have a gushing, burst or leaking of fluid from the vagina. °· An oral temperature above 102° F (38.9° C) develops. °· You have passage of  blood-tinged mucus. °· You develop vaginal bleeding. °· You develop continuous belly (abdominal) pain. °· You have low back pain that you never had before. °· You feel the baby's head pushing down causing pelvic pressure. °· The baby is not moving as much as it used to. °Document Released: 02/05/2005 Document Revised: 04/30/2011 Document Reviewed: 11/17/2012 °ExitCare® Patient Information ©2014 [employer]. ° ° °

## 2013-07-10 NOTE — MAU Provider Note (Signed)
First Provider Initiated Contact with Patient 07/10/13 1606      Chief Complaint:  Labor Eval and Decreased Fetal Movement   Breanna Malone is  33 y.o. [id number] at [redacted]w[redacted]d presents complaining of Labor Eval and Decreased Fetal Movement .  She states irregular, painful contractions this morning that has since decreased. Pt states that she is now not having contractions. Pt states that she is not having vaginal bleeding, intact membranes, along with decreased  fetal movement.  Pt states she is having fetal movement but less from baseline. Pt states that she was unable to count this AM because of contractions. Pt is feeling constant pressure over the last week. Pt states that she is otherwise comfortable and without other complaints.  Obstetrical/Gynecological History: OB History   Grav Para Term Preterm Abortions TAB SAB Ect Mult Living   7 5 5  1  1   3      Past Medical History: Past Medical History  Diagnosis Date  . Asthma   . Bronchitis   . High cholesterol   . Abnormal Pap smear   . Elevated cholesterol   . Hyperlipidemia   . Hyperlipoproteinemia   . Chest pain   . Bipolar 1 disorder   . Right ovarian cyst 10/31/2012    Complex right ovarian cyst seen 8/18 in ER need f/u US  . Carpal tunnel syndrome     Past Surgical History: Past Surgical History  Procedure Laterality Date  . Hernia repair      Family History: Family History  Problem Relation Age of Onset  . Asthma Other   . Bronchitis Other   . Diabetes Mother   . Hypertension Mother   . Asthma Father   . Bronchitis Daughter   . Asthma Daughter   . Stroke Paternal Grandmother   . Heart attack Paternal Grandmother   . Cancer Paternal Grandmother   . Asthma Son   . Bronchitis Son   . Asthma Daughter   . Bronchitis Daughter   . Asthma Son   . Cancer Cousin   . Cancer Maternal Uncle     Social History: History  Substance Use Topics  . Smoking status: Never Smoker   . Smokeless tobacco: Never Used  .  Alcohol Use: No    Allergies:  Allergies  Allergen Reactions  . Darvocet [Propoxyphene N-Acetaminophen] Itching  . Hydrocodone-Acetaminophen Itching  . Penicillins Hives and Itching  . Latex Itching and Rash  . Lortab [Hydrocodone-Acetaminophen] Rash  . Percocet [Oxycodone-Acetaminophen] Itching and Rash    Meds:  Prescriptions prior to admission  Medication Sig Dispense Refill  . acetaminophen (TYLENOL) 500 MG tablet Take 500 mg by mouth every 6 (six) hours as needed for mild pain.       . butalbital-acetaminophen-caffeine (FIORICET) 50-325-40 MG per tablet Take 1 tablet by mouth every 6 (six) hours as needed for headache. Max of 6 tablets per day  10 tablet  0  . ferrous sulfate 325 (65 FE) MG tablet Take 325 mg by mouth daily with breakfast.      . Prenatal Vit-Fe Fumarate-FA (PRENATAL MULTIVITAMIN) TABS tablet Take 1 tablet by mouth daily at 12 noon.      . Simethicone (GAS RELIEF 80 PO) Take by mouth.      . triamcinolone ointment (KENALOG) 0.5 % Apply 1 application topically 2 (two) times daily.  30 g  0    Review of Systems -   Review of Systems  See HPI, denies F/c, sob, n/v,  d/c, urinary or bowel symptoms    Physical Exam  Blood pressure 120/69, pulse 100, temperature 98.8 F (37.1 C), temperature source Oral, resp. rate 18, height 5\' 5"  (1.651 m), weight 108.5 kg (239 lb 3.2 oz), last menstrual period 10/15/2012. GENERAL: Well-developed, well-nourished female in no acute distress.  ABDOMEN: Soft, nontender, nondistended, gravid.  EXTREMITIES: Nontender, 2+ distal pulses. DTR's 2+  Dilation: 2 Effacement (%): Thick Cervical Position: Anterior Station: -3 Presentation: Vertex Exam by:: Noel Gerold RN  Presentation: cephalic  FHT:  Baseline rate 140s bpm   Variability moderate  Accelerations present   Decelerations none Contractions: none   Limited 3rd trimester [place]: SIUP, VTX, FHT 143, Ant placenta, AFI 11.8  Labs: No results found for this or any  previous visit (from the past 24 hour(s)). Imaging Studies:  No results found.  Assessment: Breanna Malone is  33 y.o. [phone] at [redacted]w[redacted]d presents with decreased fetal movement. Reassuring and reactive strip with normal AFI (Reassuring modified BPP). Discharge home with labor precautions.Allen Norris 5/22/20154:18 PM

## 2013-07-14 ENCOUNTER — Ambulatory Visit (INDEPENDENT_AMBULATORY_CARE_PROVIDER_SITE_OTHER): Payer: Self-pay | Admitting: Obstetrics and Gynecology

## 2013-07-14 VITALS — BP 120/60 | Wt 240.6 lb

## 2013-07-14 DIAGNOSIS — Z1389 Encounter for screening for other disorder: Secondary | ICD-10-CM

## 2013-07-14 DIAGNOSIS — Z331 Pregnant state, incidental: Secondary | ICD-10-CM

## 2013-07-14 DIAGNOSIS — Z3480 Encounter for supervision of other normal pregnancy, unspecified trimester: Secondary | ICD-10-CM

## 2013-07-14 LAB — POCT URINALYSIS DIPSTICK
Blood, UA: 1
Glucose, UA: NEGATIVE
KETONES UA: NEGATIVE
Nitrite, UA: NEGATIVE
PROTEIN UA: 1

## 2013-07-14 NOTE — Patient Instructions (Signed)
To be at [facility] hospital at 7 am Sunday for IOL.

## 2013-07-14 NOTE — Progress Notes (Signed)
Here with pregnancy care manager, Clayton Lefort, Social worker with [facility](Pregnancy care management-resource management)[phone] Pt now wishes to delay BTL due to concerns that DSS may wish to take this child Pt now leaning toward interval tubal at 4-6 wk pp. We will continue to monitor pt preferences on timing of BTL. 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 Transportation issues.: pt will need transportation assistance ,Pt aware of 911.

## 2013-07-14 NOTE — Progress Notes (Signed)
Due to transportation issues, patient anxiety, and coordination of care, will schedule for IOL at 67 w 4d on Sunday 31 May 7am when I am on call. Pt aware of time of IOL by Pitocin IOL.

## 2013-07-15 ENCOUNTER — Encounter (HOSPITAL_COMMUNITY): Payer: Medicaid Other | Admitting: Anesthesiology

## 2013-07-15 ENCOUNTER — Inpatient Hospital Stay (HOSPITAL_COMMUNITY)
Admission: AD | Admit: 2013-07-15 | Discharge: 2013-07-18 | DRG: 775 | Disposition: A | Discharge status: Home / Self Care | Payer: Medicaid Other | Source: Ambulatory Visit | Attending: Family Medicine | Admitting: Family Medicine

## 2013-07-15 ENCOUNTER — Encounter (HOSPITAL_COMMUNITY): Payer: Self-pay

## 2013-07-15 ENCOUNTER — Inpatient Hospital Stay (HOSPITAL_COMMUNITY): Payer: Medicaid Other | Admitting: Anesthesiology

## 2013-07-15 DIAGNOSIS — Z3480 Encounter for supervision of other normal pregnancy, unspecified trimester: Secondary | ICD-10-CM

## 2013-07-15 DIAGNOSIS — O09529 Supervision of elderly multigravida, unspecified trimester: Secondary | ICD-10-CM | POA: Diagnosis present

## 2013-07-15 DIAGNOSIS — Z3490 Encounter for supervision of normal pregnancy, unspecified, unspecified trimester: Secondary | ICD-10-CM

## 2013-07-15 DIAGNOSIS — F319 Bipolar disorder, unspecified: Secondary | ICD-10-CM | POA: Diagnosis present

## 2013-07-15 DIAGNOSIS — Z823 Family history of stroke: Secondary | ICD-10-CM

## 2013-07-15 DIAGNOSIS — O99344 Other mental disorders complicating childbirth: Secondary | ICD-10-CM | POA: Diagnosis present

## 2013-07-15 DIAGNOSIS — Z8249 Family history of ischemic heart disease and other diseases of the circulatory system: Secondary | ICD-10-CM

## 2013-07-15 DIAGNOSIS — J45909 Unspecified asthma, uncomplicated: Secondary | ICD-10-CM | POA: Diagnosis present

## 2013-07-15 DIAGNOSIS — Z833 Family history of diabetes mellitus: Secondary | ICD-10-CM

## 2013-07-15 LAB — CBC
HEMATOCRIT: 33.7 % — AB (ref 36.0–46.0)
HEMOGLOBIN: 11.4 g/dL — AB (ref 12.0–15.0)
MCH: 32.3 pg (ref 26.0–34.0)
MCHC: 33.8 g/dL (ref 30.0–36.0)
MCV: 95.5 fL (ref 78.0–100.0)
Platelets: 239 10*3/uL (ref 150–400)
RBC: 3.53 MIL/uL — ABNORMAL LOW (ref 3.87–5.11)
RDW: 12.9 % (ref 11.5–15.5)
WBC: 9.6 10*3/uL (ref 4.0–10.5)

## 2013-07-15 LAB — RPR

## 2013-07-15 LAB — ABO/RH: ABO/RH(D): O POS

## 2013-07-15 LAB — TYPE AND SCREEN
ABO/RH(D): O POS
ANTIBODY SCREEN: NEGATIVE

## 2013-07-15 MED ORDER — PHENYLEPHRINE 40 MCG/ML (10ML) SYRINGE FOR IV PUSH (FOR BLOOD PRESSURE SUPPORT)
80.0000 ug | PREFILLED_SYRINGE | INTRAVENOUS | Status: DC | PRN
  Filled 2013-07-15: qty 2

## 2013-07-15 MED ORDER — EPHEDRINE 5 MG/ML INJ
10.0000 mg | INTRAVENOUS | Status: DC | PRN
  Filled 2013-07-15: qty 2

## 2013-07-15 MED ORDER — LACTATED RINGERS IV SOLN
INTRAVENOUS | Status: DC
  Administered 2013-07-15 (×2): 125 mL/h via INTRAVENOUS

## 2013-07-15 MED ORDER — FENTANYL 2.5 MCG/ML BUPIVACAINE 1/10 % EPIDURAL INFUSION (WH - ANES)
14.0000 mL/h | INTRAMUSCULAR | Status: DC | PRN
  Administered 2013-07-15: 14 mL/h via EPIDURAL
  Filled 2013-07-15: qty 125

## 2013-07-15 MED ORDER — ONDANSETRON HCL 4 MG/2ML IJ SOLN: 4.0000 mg | Freq: Four times a day (QID) | INTRAMUSCULAR | Status: DC | PRN

## 2013-07-15 MED ORDER — FENTANYL 2.5 MCG/ML BUPIVACAINE 1/10 % EPIDURAL INFUSION (WH - ANES)
INTRAMUSCULAR | Status: AC
  Filled 2013-07-15: qty 125

## 2013-07-15 MED ORDER — LACTATED RINGERS IV SOLN: 500.0000 mL | INTRAVENOUS | Status: DC | PRN

## 2013-07-15 MED ORDER — FLEET ENEMA 7-19 GM/118ML RE ENEM: 1.0000 | ENEMA | RECTAL | Status: DC | PRN

## 2013-07-15 MED ORDER — LIDOCAINE HCL (PF) 1 % IJ SOLN
INTRAMUSCULAR | Status: DC | PRN
  Administered 2013-07-15 (×2): 8 mL

## 2013-07-15 MED ORDER — LACTATED RINGERS IV SOLN: 500.0000 mL | Freq: Once | INTRAVENOUS | Status: DC

## 2013-07-15 MED ORDER — OXYTOCIN 40 UNITS IN LACTATED RINGERS INFUSION - SIMPLE MED
62.5000 mL/h | INTRAVENOUS | Status: DC
  Administered 2013-07-16: 62.5 mL/h via INTRAVENOUS

## 2013-07-15 MED ORDER — LIDOCAINE HCL (PF) 1 % IJ SOLN
30.0000 mL | INTRAMUSCULAR | Status: DC | PRN
  Filled 2013-07-15: qty 30

## 2013-07-15 MED ORDER — FENTANYL 2.5 MCG/ML BUPIVACAINE 1/10 % EPIDURAL INFUSION (WH - ANES)
INTRAMUSCULAR | Status: DC | PRN
  Administered 2013-07-15: 14 mL/h via EPIDURAL

## 2013-07-15 MED ORDER — TERBUTALINE SULFATE 1 MG/ML IJ SOLN: 0.2500 mg | Freq: Once | INTRAMUSCULAR | Status: AC | PRN

## 2013-07-15 MED ORDER — IBUPROFEN 600 MG PO TABS
600.0000 mg | ORAL_TABLET | Freq: Four times a day (QID) | ORAL | Status: DC | PRN
  Administered 2013-07-16: 600 mg via ORAL
  Filled 2013-07-15: qty 1

## 2013-07-15 MED ORDER — PHENYLEPHRINE 40 MCG/ML (10ML) SYRINGE FOR IV PUSH (FOR BLOOD PRESSURE SUPPORT)
PREFILLED_SYRINGE | INTRAVENOUS | Status: AC
  Filled 2013-07-15: qty 10

## 2013-07-15 MED ORDER — CITRIC ACID-SODIUM CITRATE 334-500 MG/5ML PO SOLN
30.0000 mL | ORAL | Status: DC | PRN
  Administered 2013-07-15: 30 mL via ORAL
  Filled 2013-07-15: qty 15

## 2013-07-15 MED ORDER — OXYTOCIN 40 UNITS IN LACTATED RINGERS INFUSION - SIMPLE MED
1.0000 m[IU]/min | INTRAVENOUS | Status: DC
Start: 2013-07-15 — End: 2013-07-16
  Administered 2013-07-15: 2 m[IU]/min via INTRAVENOUS
  Filled 2013-07-15: qty 1000

## 2013-07-15 MED ORDER — EPHEDRINE 5 MG/ML INJ
INTRAVENOUS | Status: AC
  Filled 2013-07-15: qty 4

## 2013-07-15 MED ORDER — OXYTOCIN BOLUS FROM INFUSION
500.0000 mL | INTRAVENOUS | Status: DC
  Administered 2013-07-16: 500 mL via INTRAVENOUS

## 2013-07-15 MED ORDER — DIPHENHYDRAMINE HCL 50 MG/ML IJ SOLN: 12.5000 mg | INTRAMUSCULAR | Status: DC | PRN

## 2013-07-15 NOTE — Progress Notes (Signed)
CSW consult noted.  [place], (CPS) verified history with pt.  CSW will complete full assessment after delivery.

## 2013-07-15 NOTE — Progress Notes (Signed)
toco not tracing correctly.  Toco changed
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
## 2013-07-15 NOTE — MAU Provider Note (Signed)
History     CSN: [id number]  Arrival date and time: 07/15/13 9767   None     Chief Complaint  Patient presents with  . Labor Eval   HPI patient is a 33 yo G7P5013 at 39.0 weeks presenting with contractions. She notes these started around 8:30 pm last night. States they were constant at that time. She states that she has continued to have a lot of contractions while in the MAU. She denies LOF and bleeding. She notes good fetal movement. She notes no abnormalities during this pregnancy.  OB History   Grav Para Term Preterm Abortions TAB SAB Ect Mult Living   7 5 5  1  1   3       Past Medical History  Diagnosis Date  . Asthma   . Bronchitis   . High cholesterol   . Abnormal Pap smear   . Elevated cholesterol   . Hyperlipidemia   . Hyperlipoproteinemia   . Chest pain   . Bipolar 1 disorder   . Right ovarian cyst 10/31/2012    Complex right ovarian cyst seen 8/18 in ER need f/u US  . Carpal tunnel syndrome     Past Surgical History  Procedure Laterality Date  . Hernia repair      Family History  Problem Relation Age of Onset  . Asthma Other   . Bronchitis Other   . Diabetes Mother   . Hypertension Mother   . Asthma Father   . Bronchitis Daughter   . Asthma Daughter   . Stroke Paternal Grandmother   . Heart attack Paternal Grandmother   . Cancer Paternal Grandmother   . Asthma Son   . Bronchitis Son   . Asthma Daughter   . Bronchitis Daughter   . Asthma Son   . Cancer Cousin   . Cancer Maternal Uncle     History  Substance Use Topics  . Smoking status: Never Smoker   . Smokeless tobacco: Never Used  . Alcohol Use: No    Allergies:  Allergies  Allergen Reactions  . Darvocet [Propoxyphene N-Acetaminophen] Itching  . Hydrocodone-Acetaminophen Itching  . Lactose Intolerance (Gi) Other (See Comments)    Causes stomach cramping.  . Other Hives    Patient is allergic to corndogs.  . Penicillins Hives and Itching  . Latex Itching and Rash  . Lortab  [Hydrocodone-Acetaminophen] Rash  . Percocet [Oxycodone-Acetaminophen] Itching and Rash    Prescriptions prior to admission  Medication Sig Dispense Refill  . acetaminophen (TYLENOL) 500 MG tablet Take 500 mg by mouth every 6 (six) hours as needed for mild pain.       . calcium carbonate (TUMS - DOSED IN MG ELEMENTAL CALCIUM) 500 MG chewable tablet Chew 1 tablet by mouth 2 (two) times daily as needed for indigestion or heartburn.      . ferrous sulfate 325 (65 FE) MG tablet Take 325 mg by mouth daily with breakfast.      . Prenatal Vit-Fe Fumarate-FA (PRENATAL MULTIVITAMIN) TABS tablet Take 1 tablet by mouth daily at 12 noon.      . Simethicone (GAS RELIEF 80 PO) Take 1 tablet by mouth 2 (two) times daily as needed (For gas.).       Marland Kitchen triamcinolone ointment (KENALOG) 0.5 % Apply 1 application topically 2 (two) times daily.  30 g  0  . butalbital-acetaminophen-caffeine (FIORICET) 50-325-40 MG per tablet Take 1 tablet by mouth every 6 (six) hours as needed for headache. Max of 6 tablets  per day  10 tablet  0    ROS see HPI Physical Exam   Blood pressure 126/53, pulse 99, temperature 97.9 F (36.6 C), temperature source Oral, resp. rate 18, height 5\' 4"  (1.626 m), weight 108.954 kg (240 lb 3.2 oz), last menstrual period 10/15/2012.  Physical Exam  Constitutional: She appears well-developed and well-nourished. No distress.  HENT:  Head: Normocephalic and atraumatic.  GI: Soft. There is no tenderness.  Gravid   Musculoskeletal: She exhibits no edema.  Neurological: She is alert.  Skin: Skin is warm and dry.   Fetal monitoring: 145, moderate variability, accels present, no decels Contractions not tracing well  MAU Course  Procedures  MDM Patient monitored in the MAU and showed cervical change over a period of monitoring. Patient presents with contractions and cervical change. Patient is in labor at this time. Will plan for admission to L&D.  Assessment and Plan  Patient is a 33 yo  G7P5013 at 39.0 weeks presenting with contractions. Will admit to L&D for expectant management of labor.  [name] 07/15/2013, 7:26 AM   I have seen and examined this patient and agree with above documentation in the resident's note.   Ebbie Latus, M.D. [facility] Fellow 07/15/2013 11:31 AM

## 2013-07-15 NOTE — Anesthesia Preprocedure Evaluation (Signed)
Anesthesia Evaluation  Patient identified by MRN, date of birth, ID band Patient awake    Reviewed: Allergy & Precautions, H&P , NPO status , Patient's Chart, lab work & pertinent test results  Airway Mallampati: II TM Distance: >3 FB Neck ROM: full    Dental no notable dental hx.    Pulmonary    Pulmonary exam normal       Cardiovascular negative cardio ROS      Neuro/Psych Bipolar Disorder    GI/Hepatic negative GI ROS, Neg liver ROS,   Endo/Other  negative endocrine ROS  Renal/GU negative Renal ROS     Musculoskeletal   Abdominal Normal abdominal exam  (+)   Peds  Hematology negative hematology ROS (+)   Anesthesia Other Findings   Reproductive/Obstetrics (+) Pregnancy                           Anesthesia Physical Anesthesia Plan  ASA: II  Anesthesia Plan: Epidural   Post-op Pain Management:    Induction:   Airway Management Planned:   Additional Equipment:   Intra-op Plan:   Post-operative Plan:   Informed Consent: I have reviewed the patients History and Physical, chart, labs and discussed the procedure including the risks, benefits and alternatives for the proposed anesthesia with the patient or authorized representative who has indicated his/her understanding and acceptance.     Plan Discussed with:   Anesthesia Plan Comments:         Anesthesia Quick Evaluation

## 2013-07-15 NOTE — Progress Notes (Signed)
SHONDELL FABEL is a 33 y.o. [phone] at [redacted]w[redacted]d admitted for active labor  Subjective: More pressure and more pain.   Objective: BP 141/81  Pulse 94  Temp(Src) 98.2 F (36.8 C) (Oral)  Resp 16  Ht 5\' 4"  (1.626 m)  Wt 108.954 kg (240 lb 3.2 oz)  BMI 41.21 kg/m2  LMP 10/15/2012 I/O last 3 completed shifts: In: -  Out: 650 [Urine:650]    FHT:  FHR: 130 bpm, variability: moderate,  accelerations:  Abscent,  decelerations:  Present variable UC:   regular, every 2-3 minutes SVE:   Dilation: 7.5 Effacement (%): 90 Station: -3 Exam by:: Dr. Raeford Razor   Labs: Lab Results  Component Value Date   WBC 9.6 07/15/2013   HGB 11.4* 07/15/2013   HCT 33.7* 07/15/2013   MCV 95.5 07/15/2013   PLT 239 07/15/2013    Assessment / Plan: Spontaneous labor, progressing normally  Labor: Progressing normally with Pit  Preeclampsia:  n/a Fetal Wellbeing:  Category II Pain Control:  Epidural I/D:  n/a Anticipated MOD:  NSVD  [name] 07/15/2013, 7:33 PM

## 2013-07-15 NOTE — Progress Notes (Signed)
Breanna Malone is a 33 y.o. [phone] at [redacted]w[redacted]d admitted for active labor  Subjective: Feeling more pressure otherwise no complaints.   Objective: BP 133/81  Pulse 99  Temp(Src) 98 F (36.7 C) (Oral)  Resp 20  Ht 5\' 4"  (1.626 m)  Wt 108.954 kg (240 lb 3.2 oz)  BMI 41.21 kg/m2  LMP 10/15/2012      FHT:  FHR: 140 bpm, variability: moderate,  accelerations:  Present,  decelerations:  Absent UC:   irregular, every 10 minutes SVE:   Dilation: 4 Effacement (%): 50 Station: -3 Exam by:: S Grindstaf RN  Labs: Lab Results  Component Value Date   WBC 9.6 07/15/2013   HGB 11.4* 07/15/2013   HCT 33.7* 07/15/2013   MCV 95.5 07/15/2013   PLT 239 07/15/2013    Assessment / Plan: Spontaneous labor, progressing normally  Labor: Progressing normally, will start Pit   Preeclampsia:  n/a Fetal Wellbeing:  Category II Pain Control:  Labor support without medications currently, asking for Epidural  I/D:  n/a Anticipated MOD:  NSVD  [name] 07/15/2013, 10:11 AM

## 2013-07-15 NOTE — MAU Note (Signed)
Pt G7 P5 at 39wks, having contractions since 2030.  Seen in the office today, SVE 3cm. Denies leaking or bleeding.

## 2013-07-15 NOTE — Progress Notes (Signed)
Breanna Malone is a 33 y.o. [phone] at [redacted]w[redacted]d admitted for active labor  Subjective: Feeling more pressure but less pain with epidural   Objective: BP 125/78  Pulse 95  Temp(Src) 98.6 F (37 C) (Axillary)  Resp 20  Ht 5\' 4"  (1.626 m)  Wt 108.954 kg (240 lb 3.2 oz)  BMI 41.21 kg/m2  LMP 10/15/2012      FHT:  FHR: 135 bpm, variability: moderate,  accelerations:  Present,  decelerations:  Absent UC:   regular, every 2-6 minutes SVE:   Dilation: 5 Effacement (%): 50 Station: -3 Exam by:: S Grindstaff RN  Labs: Lab Results  Component Value Date   WBC 9.6 07/15/2013   HGB 11.4* 07/15/2013   HCT 33.7* 07/15/2013   MCV 95.5 07/15/2013   PLT 239 07/15/2013    Assessment / Plan: Spontaneous labor, progressing normally  Labor: Progressing on Pitocin, will continue to increase then AROM Preeclampsia:  n/a Fetal Wellbeing:  Category I Pain Control:  Epidural I/D:  n/a Anticipated MOD:  NSVD  Breanna Malone 07/15/2013, 1:32 PM

## 2013-07-15 NOTE — Progress Notes (Signed)
Breanna Malone is a 33 y.o. [phone] at [redacted]w[redacted]d admitted for active labor  Subjective: Feeling well. No complaints.   Objective: BP 108/61  Pulse 90  Temp(Src) 97.9 F (36.6 C) (Oral)  Resp 20  Ht 5\' 4"  (1.626 m)  Wt 108.954 kg (240 lb 3.2 oz)  BMI 41.21 kg/m2  LMP 10/15/2012   Total I/O In: -  Out: 650 [Urine:650]  FHT:  FHR: 140 bpm, variability: moderate,  accelerations:  Present,  decelerations:  Present variable UC:   regular, every 3-4 minutes SVE:   Dilation: 5.5 Effacement (%): 50 Station: -3 Exam by:: Dr Olevia Bowens  Labs: Lab Results  Component Value Date   WBC 9.6 07/15/2013   HGB 11.4* 07/15/2013   HCT 33.7* 07/15/2013   MCV 95.5 07/15/2013   PLT 239 07/15/2013    Assessment / Plan: Spontaneous labor, progressing normally  Labor: Progressing normally on pit, AROM marked volume  Preeclampsia:  n/a Fetal Wellbeing:  Category II Pain Control:  Epidural I/D:  n/a Anticipated MOD:  NSVD  Breanna Malone 07/15/2013, 5:48 PM

## 2013-07-15 NOTE — Anesthesia Procedure Notes (Signed)
Epidural Patient location during procedure: OB Start time: 07/15/2013 11:32 AM End time: 07/15/2013 11:36 AM  Staffing Anesthesiologist: Lyn Hollingshead Performed by: anesthesiologist   Preanesthetic Checklist Completed: patient identified, surgical consent, pre-op evaluation, timeout performed, IV checked, risks and benefits discussed and monitors and equipment checked  Epidural Patient position: sitting Prep: site prepped and draped and DuraPrep Patient monitoring: continuous pulse ox and blood pressure Approach: midline Location: L3-L4 Injection technique: LOR air  Needle:  Needle type: Tuohy  Needle gauge: 17 G Needle length: 9 cm and 9 Needle insertion depth: 5 cm cm Catheter type: closed end flexible Catheter size: 19 Gauge Catheter at skin depth: 10 cm Test dose: negative and Other  Assessment Sensory level: T9 Events: blood not aspirated, injection not painful, no injection resistance, negative IV test and no paresthesia  Additional Notes Reason for block:procedure for pain

## 2013-07-15 NOTE — H&P (Signed)
Attestation of Attending Supervision of Advanced Practitioner (PA/CNM/NP): Evaluation and management procedures were performed by the Advanced Practitioner under my supervision and collaboration.  I have reviewed the Advanced Practitioner's note and chart, and I agree with the management and plan.  Tanya S Pratt, MD [facility] Attending 07/15/2013 12:04 PM   

## 2013-07-15 NOTE — MAU Provider Note (Signed)
Attestation of Attending Supervision of Advanced Practitioner (PA/CNM/NP): Evaluation and management procedures were performed by the Advanced Practitioner under my supervision and collaboration.  I have reviewed the Advanced Practitioner's note and chart, and I agree with the management and plan.  Donnamae Jude, MD [facility] Attending 07/15/2013 12:04 PM

## 2013-07-16 ENCOUNTER — Encounter (HOSPITAL_COMMUNITY): Payer: Self-pay | Admitting: *Deleted

## 2013-07-16 DIAGNOSIS — O09529 Supervision of elderly multigravida, unspecified trimester: Secondary | ICD-10-CM

## 2013-07-16 DIAGNOSIS — F319 Bipolar disorder, unspecified: Secondary | ICD-10-CM

## 2013-07-16 DIAGNOSIS — O99344 Other mental disorders complicating childbirth: Secondary | ICD-10-CM

## 2013-07-16 MED ORDER — PRENATAL MULTIVITAMIN CH
1.0000 | ORAL_TABLET | Freq: Every day | ORAL | Status: DC
  Administered 2013-07-16 – 2013-07-18 (×3): 1 via ORAL
  Filled 2013-07-16 (×3): qty 1

## 2013-07-16 MED ORDER — BENZOCAINE-MENTHOL 20-0.5 % EX AERO: 1.0000 "application " | INHALATION_SPRAY | CUTANEOUS | Status: DC | PRN

## 2013-07-16 MED ORDER — SENNOSIDES-DOCUSATE SODIUM 8.6-50 MG PO TABS
2.0000 | ORAL_TABLET | ORAL | Status: DC
  Administered 2013-07-17: 2 via ORAL
  Filled 2013-07-16: qty 2

## 2013-07-16 MED ORDER — ONDANSETRON HCL 4 MG/2ML IJ SOLN: 4.0000 mg | INTRAMUSCULAR | Status: DC | PRN

## 2013-07-16 MED ORDER — WITCH HAZEL-GLYCERIN EX PADS: 1.0000 "application " | MEDICATED_PAD | CUTANEOUS | Status: DC | PRN

## 2013-07-16 MED ORDER — ZOLPIDEM TARTRATE 5 MG PO TABS: 5.0000 mg | ORAL_TABLET | Freq: Every evening | ORAL | Status: DC | PRN

## 2013-07-16 MED ORDER — IBUPROFEN 600 MG PO TABS
600.0000 mg | ORAL_TABLET | Freq: Four times a day (QID) | ORAL | Status: DC
  Administered 2013-07-16 – 2013-07-18 (×10): 600 mg via ORAL
  Filled 2013-07-16 (×9): qty 1

## 2013-07-16 MED ORDER — ONDANSETRON HCL 4 MG PO TABS: 4.0000 mg | ORAL_TABLET | ORAL | Status: DC | PRN

## 2013-07-16 MED ORDER — DIBUCAINE 1 % RE OINT: 1.0000 "application " | TOPICAL_OINTMENT | RECTAL | Status: DC | PRN

## 2013-07-16 MED ORDER — LANOLIN HYDROUS EX OINT: TOPICAL_OINTMENT | CUTANEOUS | Status: DC | PRN

## 2013-07-16 MED ORDER — SIMETHICONE 80 MG PO CHEW: 80.0000 mg | CHEWABLE_TABLET | ORAL | Status: DC | PRN

## 2013-07-16 MED ORDER — DIPHENHYDRAMINE HCL 25 MG PO CAPS: 25.0000 mg | ORAL_CAPSULE | Freq: Four times a day (QID) | ORAL | Status: DC | PRN

## 2013-07-16 MED ORDER — TETANUS-DIPHTH-ACELL PERTUSSIS 5-2.5-18.5 LF-MCG/0.5 IM SUSP
0.5000 mL | Freq: Once | INTRAMUSCULAR | Status: AC
  Administered 2013-07-17: 0.5 mL via INTRAMUSCULAR

## 2013-07-16 NOTE — Progress Notes (Signed)
Breanna Malone is a 33 y.o. [phone] at [redacted]w[redacted]d -- LATE ENTRY  Subjective: Mostly comfortable with epidural  Objective: BP 166/88  Pulse 120  Temp(Src) 100 F (37.8 C) (Axillary)  Resp 18  Ht 5\' 4"  (1.626 m)  Wt 108.954 kg (240 lb 3.2 oz)  BMI 41.21 kg/m2  LMP 10/15/2012 I/O last 3 completed shifts: In: -  Out: 650 [Urine:650] Total I/O In: -  Out: 200 [Blood:200]  FHT:  FHR: 140s bpm, variability: moderate,  accelerations:  Present,  decelerations:  Absent; occ early variables with ctx UC:   regular, every 2-3 minutes with Pitocin at 14 mu/min SVE:  8/80/-2 (KS) Labs: Lab Results  Component Value Date   WBC 9.6 07/15/2013   HGB 11.4* 07/15/2013   HCT 33.7* 07/15/2013   MCV 95.5 07/15/2013   PLT 239 07/15/2013    Assessment / Plan: Active labor  Will examine cx in 1-2 hours or sooner as needed with pressure  Myrtis Ser 07/16/2013, 1:22 AM

## 2013-07-16 NOTE — Anesthesia Postprocedure Evaluation (Signed)
  Anesthesia Post-op Note  Patient: Breanna Malone  Procedure(s) Performed: * No procedures listed *  Patient Location: Mother/Baby  Anesthesia Type:Epidural  Level of Consciousness: awake  Airway and Oxygen Therapy: Patient Spontanous Breathing  Post-op Pain: none  Post-op Assessment: Patient's Cardiovascular Status Stable, Respiratory Function Stable, Patent Airway, No signs of Nausea or vomiting, Pain level controlled, No headache, No backache, No residual numbness and No residual motor weakness  Post-op Vital Signs: Reviewed and stable  Last Vitals:  Filed Vitals:   07/16/13 0345  BP: 124/80  Pulse: 100  Temp: 37.8 C  Resp: 20    Complications: No apparent anesthesia complications

## 2013-07-16 NOTE — Progress Notes (Signed)
UR chart review completed.  
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
## 2013-07-17 MED ORDER — IBUPROFEN 600 MG PO TABS: 600.0000 mg | ORAL_TABLET | Freq: Four times a day (QID) | ORAL | Status: DC

## 2013-07-17 NOTE — Discharge Summary (Signed)
Obstetric Discharge Summary Reason for Admission: onset of labor Prenatal Procedures: none Intrapartum Procedures: spontaneous vaginal delivery Postpartum Procedures: none Complications-Operative and Postpartum: none Hemoglobin  Date Value Ref Range Status  07/15/2013 11.4* 12.0 - 15.0 g/dL Final     HCT  Date Value Ref Range Status  07/15/2013 33.7* 36.0 - 46.0 % Final    History Patient is a 33 yo [id number] at 39.0 weeks presented with contractions. At 12:03 AM (5/28) a viable and healthy female was delivered via Vaginal, Spontaneous Delivery (Presentation: Right Occiput Anterior). APGAR: 8, 9; weight 8 lb 13.5 oz (4010 g). Placenta status: Intact, Spontaneous. Cord: 3 vessels with no complications.  SW consult was conducted because she lives alone & admits to loosing custody of her other children (ages 77, 70 & 60) & having 2 children pass away.  Pt appears to have some cognitive delays but was able to answer this writers questions appropriate. She states she has all the necessary supplies for the baby & appears to be bonding well. CSW will contact [facility] & update staff of discharge plans. CPS reports no further intervention required and there is no barrier to discharge. She will have an interval BTL for contraception and is going to bottle feed.    Physical Exam:  General: alert, cooperative and no distress Lochia: appropriate Uterine Fundus: firm Incision: n/a DVT Evaluation: No evidence of DVT seen on physical exam.  Discharge Diagnoses: Term Pregnancy-delivered  Discharge Information: Date: 07/17/2013 Activity: unrestricted Diet: routine Medications: Ibuprofen Condition: stable Instructions: refer to practice specific booklet Discharge to: home   Newborn Data: Live born female  Birth Weight: 8 lb 13.4 oz (4009 g) APGAR: 8, 9  Home with mother.  [name] 07/17/2013, 7:21 AM  I have seen and examined this patient and agree with above documentation  in the resident's note.   Ebbie Latus, M.D. [facility] Fellow 07/17/2013 10:04 AM

## 2013-07-17 NOTE — Progress Notes (Signed)
Patient has pacifier propped in place with wadded up wash cloth.  Baby sucking aggressively.  Told patient she should not prop pacifiers or bottles.  Also reminded her that when baby is sucking like that she should feed her.  Patient states she thought it was too close in time to the last time the baby ate.  Talked with her about volume of feedings and frequency, then about signs of hunger. Patient seemed easily confused about this.  Went over this again.  Removed pacifier and instructed her to feed baby.

## 2013-07-17 NOTE — Discharge Instructions (Signed)

## 2013-07-18 MED ORDER — MEDROXYPROGESTERONE ACETATE 150 MG/ML IM SUSP
150.0000 mg | Freq: Once | INTRAMUSCULAR | Status: AC
  Administered 2013-07-18: 150 mg via INTRAMUSCULAR
  Filled 2013-07-18: qty 1

## 2013-07-18 NOTE — Discharge Summary (Signed)
Obstetric Discharge Summary Reason for Admission: onset of labor and / induction of labor as ctx stopped after admission Prenatal Procedures: none Intrapartum Procedures: spontaneous vaginal delivery Postpartum Procedures: none Complications-Operative and Postpartum: none Hemoglobin  Date Value Ref Range Status  07/15/2013 11.4* 12.0 - 15.0 g/dL Final     HCT  Date Value Ref Range Status  07/15/2013 33.7* 36.0 - 46.0 % Final   Breanna Malone is a 33yo [id number] admitted at 39wks on 07/15/13 with regular ctx. These spaced out requiring Pitocin/AROM to achieve SVD shortly after midnight on 5/28. By PPD#2 the pt is deemed to have received the full benefit of her hospital stay and she will be discharged home. At this point it appears that the baby will be accompanying her. She is bottlefeeding and plans on an interval BTL with Dr Glo Herring in the near future.  Physical Exam:  General: alert, cooperative and no distress Lochia: appropriate Uterine Fundus: firm Incision: n/a DVT Evaluation: No evidence of DVT seen on physical exam. Negative Homan's sign.  Discharge Diagnoses: Term Pregnancy-delivered  Discharge Information: Date: 07/18/2013 Activity: pelvic rest Diet: routine Medications: Ibuprofen Condition: stable Instructions: refer to practice specific booklet Discharge to: home Follow-up Information   Follow up with [facility]. (please call for a follow-up appointment 6 weeks after discharge)    Contact information:   [place] [phone]      Newborn Data: Live born female  Birth Weight: 8 lb 13.4 oz (4009 g) APGAR: 8, 9  Home with mother.  [name] 07/18/2013, 7:54 AM  I have seen and examined this patient and I agree with the above. Cephus Shelling [facility] 9:01 AM 07/18/2013

## 2013-07-19 ENCOUNTER — Inpatient Hospital Stay (HOSPITAL_COMMUNITY): Admission: RE | Admit: 2013-07-19 | Payer: Medicaid Other | Source: Ambulatory Visit

## 2013-07-20 ENCOUNTER — Telehealth: Payer: Self-pay | Admitting: *Deleted

## 2013-07-20 NOTE — Telephone Encounter (Signed)
Johnetta from the [place] [facility] states pt HGB today 9.9. Pt is taking PNV and picking up iron pills today. Pt delivered SVD 07/16/13.

## 2013-08-12 ENCOUNTER — Ambulatory Visit: Payer: Medicaid Other | Admitting: Advanced Practice Midwife

## 2013-08-17 ENCOUNTER — Ambulatory Visit (INDEPENDENT_AMBULATORY_CARE_PROVIDER_SITE_OTHER): Payer: Medicaid Other | Admitting: Women's Health

## 2013-08-17 ENCOUNTER — Encounter: Payer: Self-pay | Admitting: Women's Health

## 2013-08-17 VITALS — BP 124/76 | Ht 64.0 in | Wt 216.0 lb

## 2013-08-17 DIAGNOSIS — Z3483 Encounter for supervision of other normal pregnancy, third trimester: Secondary | ICD-10-CM

## 2013-08-17 MED ORDER — MEDROXYPROGESTERONE ACETATE 150 MG/ML IM SUSP: 150.0000 mg | INTRAMUSCULAR | Status: DC

## 2013-08-17 NOTE — Patient Instructions (Signed)
Medroxyprogesterone injection [Contraceptive]- Depo What is this medicine? MEDROXYPROGESTERONE (me DROX ee proe JES te rone) contraceptive injections prevent pregnancy. They provide effective birth control for 3 months. Depo-subQ Provera 104 is also used for treating pain related to endometriosis. This medicine may be used for other purposes; ask your health care provider or pharmacist if you have questions. COMMON BRAND NAME(S): Depo-Provera, Depo-subQ Provera 104 What should I tell my health care provider before I take this medicine? They need to know if you have any of these conditions: -frequently drink alcohol -asthma -blood vessel disease or a history of a blood clot in the lungs or legs -bone disease such as osteoporosis -breast cancer -diabetes -eating disorder (anorexia nervosa or bulimia) -high blood pressure -HIV infection or AIDS -kidney disease -liver disease -mental depression -migraine -seizures (convulsions) -stroke -tobacco smoker -vaginal bleeding -an unusual or allergic reaction to medroxyprogesterone, other hormones, medicines, foods, dyes, or preservatives -pregnant or trying to get pregnant -breast-feeding How should I use this medicine? Depo-Provera Contraceptive injection is given into a muscle. Depo-subQ Provera 104 injection is given under the skin. These injections are given by a health care professional. You must not be pregnant before getting an injection. The injection is usually given during the first 5 days after the start of a menstrual period or 6 weeks after delivery of a baby. Talk to your pediatrician regarding the use of this medicine in children. Special care may be needed. These injections have been used in female children who have started having menstrual periods. Overdosage: If you think you have taken too much of this medicine contact a poison control center or emergency room at once. NOTE: This medicine is only for you. Do not share this  medicine with others. What if I miss a dose? Try not to miss a dose. You must get an injection once every 3 months to maintain birth control. If you cannot keep an appointment, call and reschedule it. If you wait longer than 13 weeks between Depo-Provera contraceptive injections or longer than 14 weeks between Depo-subQ Provera 104 injections, you could get pregnant. Use another method for birth control if you miss your appointment. You may also need a pregnancy test before receiving another injection. What may interact with this medicine? Do not take this medicine with any of the following medications: -bosentan This medicine may also interact with the following medications: -aminoglutethimide -antibiotics or medicines for infections, especially rifampin, rifabutin, rifapentine, and griseofulvin -aprepitant -barbiturate medicines such as phenobarbital or primidone -bexarotene -carbamazepine -medicines for seizures like ethotoin, felbamate, oxcarbazepine, phenytoin, topiramate -modafinil -St. John's wort This list may not describe all possible interactions. Give your health care provider a list of all the medicines, herbs, non-prescription drugs, or dietary supplements you use. Also tell them if you smoke, drink alcohol, or use illegal drugs. Some items may interact with your medicine. What should I watch for while using this medicine? This drug does not protect you against HIV infection (AIDS) or other sexually transmitted diseases. Use of this product may cause you to lose calcium from your bones. Loss of calcium may cause weak bones (osteoporosis). Only use this product for more than 2 years if other forms of birth control are not right for you. The longer you use this product for birth control the more likely you will be at risk for weak bones. Ask your health care professional how you can keep strong bones. You may have a change in bleeding pattern or irregular periods. Many females stop  having periods while taking this drug. If you have received your injections on time, your chance of being pregnant is very low. If you think you may be pregnant, see your health care professional as soon as possible. Tell your health care professional if you want to get pregnant within the next year. The effect of this medicine may last a long time after you get your last injection. What side effects may I notice from receiving this medicine? Side effects that you should report to your doctor or health care professional as soon as possible: -allergic reactions like skin rash, itching or hives, swelling of the face, lips, or tongue -breast tenderness or discharge -breathing problems -changes in vision -depression -feeling faint or lightheaded, falls -fever -pain in the abdomen, chest, groin, or leg -problems with balance, talking, walking -unusually weak or tired -yellowing of the eyes or skin Side effects that usually do not require medical attention (report to your doctor or health care professional if they continue or are bothersome): -acne -fluid retention and swelling -headache -irregular periods, spotting, or absent periods -temporary pain, itching, or skin reaction at site where injected -weight gain This list may not describe all possible side effects. Call your doctor for medical advice about side effects. You may report side effects to FDA at 1-800-FDA-1088. Where should I keep my medicine? This does not apply. The injection will be given to you by a health care professional. NOTE: This sheet is a summary. It may not cover all possible information. If you have questions about this medicine, talk to your doctor, pharmacist, or health care provider.  2015, Elsevier/Gold Standard. (2008-02-27 18:37:56)  Sterilization Information, Female Female sterilization is a procedure to permanently prevent pregnancy. There are different ways to perform sterilization, but all either block or  close the fallopian tubes so that your eggs cannot reach your uterus. If your egg cannot reach your uterus, sperm cannot fertilize the egg, and you cannot get pregnant.  Sterilization is performed by a surgical procedure. Sometimes these procedures are performed in a hospital while a patient is asleep. Sometimes they can be done in a clinic setting with the patient awake. The fallopian tubes can be surgically cut, tied, or sealed through a procedure called tubal ligation. The fallopian tubes can also be closed with clips or rings. Sterilization can also be done by placing a tiny coil into each fallopian tube, which causes scar tissue to grow inside the tube. The scar tissue then blocks the tubes.  Discuss sterilization with your caregiver to answer any concerns you or your partner may have. You may want to ask what type of sterilization your caregiver performs. Some caregivers may not perform all the various options. Sterilization is permanent and should only be done if you are sure you do not want children or do not want any more children. Having a sterilization reversed may not be successful.  STERILIZATION PROCEDURES  Laparoscopic sterilization. This is a surgical method performed at a time other than right after childbirth. Two incisions are made in the lower abdomen. A thin, lighted tube (laparoscope) is inserted into one of the incisions and is used to perform the procedure. The fallopian tubes are closed with a ring or a clip. An instrument that uses heat could be used to seal the tubes closed (electrocautery).   Mini-laparotomy. This is a surgical method done 1 or 2 days after giving birth. Typically, a small incision is made just below the belly button (umbilicus) and the fallopian tubes  are exposed. The tubes can then be sealed, tied, or cut.   Hysteroscopic sterilization. This is performed at a time other than right after childbirth. A tiny, spring-like coil is inserted through the cervix and  uterus and placed into the fallopian tubes. The coil causes scaring and blocks the tubes. Other forms of contraception should be used for 3 months after the procedure to allow the scar tissue to form completely. Additionally, it is required hysterosalpingography be done 3 months later to ensure that the procedure was successful. Hysterosalpingography is a procedure that uses X-rays to look at your uterus and fallopian tubes after a material to make them show up better has been inserted. IS STERILIZATION SAFE? Sterilization is considered safe with very rare complications. Risks depend on the type of procedure you have. As with any surgical procedure, there are risks. Some risks of sterilization by any means include:   Bleeding.  Infection.  Reaction to anesthesia medicine.  Injury to surrounding organs. Risks specific to having hysteroscopic coils placed include:  The coils may not be placed correctly the first time.   The coils may move out of place.   The tubes may not get completely blocked after 3 months.   Injury to surrounding organs when placing the coil.  HOW EFFECTIVE IS FEMALE STERILIZATION? Sterilization is nearly 100% effective, but it can fail. Depending on the type of sterilization, the rate of failure can be as high as 3%. After hysteroscopic sterilization with placement of fallopian tube coils, you will need back-up birth control for 3 months after the procedure. Sterilization is effective for a lifetime.  BENEFITS OF STERILIZATION  It does not affect your hormones, and therefore will not affect your menstrual periods, sexual desire, or performance.   It is effective for a lifetime.   It is safe.   You do not need to worry about getting pregnant. Keep in mind that if you had the hysteroscopic placement procedure, you must wait 3 months after the procedure (or until your caregiver confirms) before pregnancy is not considered possible.   There are no side  effects unlike other types of birth control (contraception).  DRAWBACKS OF STERILIZATION  You must be sure you do not want children or any more children. The procedure is permanent.   It does not provide protection against sexually transmitted infections (STIs).   The tubes can grow back together. If this happens, there is a risk of pregnancy. There is also an increased risk (50%) of pregnancy being an ectopic pregnancy. This is a pregnancy that happens outside of the uterus. Document Released: 07/25/2007 Document Revised: 02/10/2013 Document Reviewed: 05/24/2011 [facility] Patient Information 2015 [place], [place]. This information is not intended to replace advice given to you by your health care provider. Make sure you discuss any questions you have with your health care provider.
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
## 2013-10-05 ENCOUNTER — Encounter: Payer: Self-pay | Admitting: Adult Health

## 2013-10-05 ENCOUNTER — Ambulatory Visit (INDEPENDENT_AMBULATORY_CARE_PROVIDER_SITE_OTHER): Payer: Medicaid Other | Admitting: Adult Health

## 2013-10-05 DIAGNOSIS — Z3049 Encounter for surveillance of other contraceptives: Secondary | ICD-10-CM

## 2013-10-05 DIAGNOSIS — Z3202 Encounter for pregnancy test, result negative: Secondary | ICD-10-CM

## 2013-10-05 DIAGNOSIS — Z3042 Encounter for surveillance of injectable contraceptive: Secondary | ICD-10-CM

## 2013-10-05 DIAGNOSIS — Z3200 Encounter for pregnancy test, result unknown: Secondary | ICD-10-CM

## 2013-10-05 LAB — POCT URINE PREGNANCY: Preg Test, Ur: NEGATIVE

## 2013-10-05 MED ORDER — MEDROXYPROGESTERONE ACETATE 150 MG/ML IM SUSP
150.0000 mg | Freq: Once | INTRAMUSCULAR | Status: AC
  Administered 2013-10-05: 150 mg via INTRAMUSCULAR

## 2013-12-21 ENCOUNTER — Encounter: Payer: Self-pay | Admitting: Adult Health

## 2013-12-28 ENCOUNTER — Encounter: Payer: Self-pay | Admitting: *Deleted

## 2013-12-28 ENCOUNTER — Other Ambulatory Visit: Payer: Medicaid Other | Admitting: Women's Health

## 2013-12-28 ENCOUNTER — Ambulatory Visit: Payer: Medicaid Other

## 2013-12-31 ENCOUNTER — Other Ambulatory Visit: Payer: Medicaid Other | Admitting: Obstetrics & Gynecology

## 2013-12-31 ENCOUNTER — Encounter: Payer: Self-pay | Admitting: *Deleted

## 2014-01-12 ENCOUNTER — Encounter: Payer: Self-pay | Admitting: Obstetrics & Gynecology

## 2014-01-12 ENCOUNTER — Other Ambulatory Visit: Payer: Medicaid Other | Admitting: Obstetrics & Gynecology

## 2014-01-18 ENCOUNTER — Encounter (HOSPITAL_COMMUNITY): Payer: Self-pay | Admitting: Emergency Medicine

## 2014-01-18 ENCOUNTER — Emergency Department (HOSPITAL_COMMUNITY)
Admission: EM | Admit: 2014-01-18 | Discharge: 2014-01-18 | Disposition: A | Discharge status: Home / Self Care | Payer: Medicaid Other | Attending: Emergency Medicine | Admitting: Emergency Medicine

## 2014-01-18 DIAGNOSIS — R05 Cough: Secondary | ICD-10-CM

## 2014-01-18 DIAGNOSIS — Z8659 Personal history of other mental and behavioral disorders: Secondary | ICD-10-CM | POA: Insufficient documentation

## 2014-01-18 DIAGNOSIS — Z79899 Other long term (current) drug therapy: Secondary | ICD-10-CM | POA: Insufficient documentation

## 2014-01-18 DIAGNOSIS — J45909 Unspecified asthma, uncomplicated: Secondary | ICD-10-CM | POA: Insufficient documentation

## 2014-01-18 DIAGNOSIS — H65191 Other acute nonsuppurative otitis media, right ear: Secondary | ICD-10-CM | POA: Insufficient documentation

## 2014-01-18 DIAGNOSIS — Z91040 Latex allergy status: Secondary | ICD-10-CM | POA: Insufficient documentation

## 2014-01-18 DIAGNOSIS — Z8639 Personal history of other endocrine, nutritional and metabolic disease: Secondary | ICD-10-CM | POA: Insufficient documentation

## 2014-01-18 DIAGNOSIS — Z7952 Long term (current) use of systemic steroids: Secondary | ICD-10-CM | POA: Insufficient documentation

## 2014-01-18 DIAGNOSIS — Z8669 Personal history of other diseases of the nervous system and sense organs: Secondary | ICD-10-CM | POA: Diagnosis not present

## 2014-01-18 DIAGNOSIS — Z880 Allergy status to penicillin: Secondary | ICD-10-CM | POA: Diagnosis not present

## 2014-01-18 DIAGNOSIS — Z8742 Personal history of other diseases of the female genital tract: Secondary | ICD-10-CM | POA: Diagnosis not present

## 2014-01-18 DIAGNOSIS — R059 Cough, unspecified: Secondary | ICD-10-CM

## 2014-01-18 DIAGNOSIS — Z792 Long term (current) use of antibiotics: Secondary | ICD-10-CM | POA: Diagnosis not present

## 2014-01-18 DIAGNOSIS — H9201 Otalgia, right ear: Secondary | ICD-10-CM | POA: Diagnosis present

## 2014-01-18 MED ORDER — ALBUTEROL SULFATE HFA 108 (90 BASE) MCG/ACT IN AERS
2.0000 | INHALATION_SPRAY | Freq: Once | RESPIRATORY_TRACT | Status: AC
  Administered 2014-01-18: 2 via RESPIRATORY_TRACT
  Filled 2014-01-18: qty 6.7

## 2014-01-18 MED ORDER — MAGIC MOUTHWASH W/LIDOCAINE: 5.0000 mL | Freq: Three times a day (TID) | ORAL | Status: DC | PRN

## 2014-01-18 MED ORDER — AZITHROMYCIN 250 MG PO TABS
500.0000 mg | ORAL_TABLET | Freq: Once | ORAL | Status: AC
  Administered 2014-01-18: 500 mg via ORAL
  Filled 2014-01-18: qty 2

## 2014-01-18 MED ORDER — AZITHROMYCIN 250 MG PO TABS: 250.0000 mg | ORAL_TABLET | Freq: Every day | ORAL | Status: DC

## 2014-01-18 MED ORDER — BENZONATATE 100 MG PO CAPS
200.0000 mg | ORAL_CAPSULE | Freq: Once | ORAL | Status: AC
  Administered 2014-01-18: 200 mg via ORAL
  Filled 2014-01-18: qty 2

## 2014-01-18 NOTE — Discharge Instructions (Signed)
Otitis Media Otitis media is redness, soreness, and inflammation of the middle ear. Otitis media may be caused by allergies or, most commonly, by infection. Often it occurs as a complication of the common cold. SIGNS AND SYMPTOMS Symptoms of otitis media may include:  Earache.  Fever.  Ringing in your ear.  Headache.  Leakage of fluid from the ear. DIAGNOSIS To diagnose otitis media, your health care provider will examine your ear with an otoscope. This is an instrument that allows your health care provider to see into your ear in order to examine your eardrum. Your health care provider also will ask you questions about your symptoms. TREATMENT  Typically, otitis media resolves on its own within 3-5 days. Your health care provider may prescribe medicine to ease your symptoms of pain. If otitis media does not resolve within 5 days or is recurrent, your health care provider may prescribe antibiotic medicines if he or she suspects that a bacterial infection is the cause. HOME CARE INSTRUCTIONS   If you were prescribed an antibiotic medicine, finish it all even if you start to feel better.  Take medicines only as directed by your health care provider.  Keep all follow-up visits as directed by your health care provider. SEEK MEDICAL CARE IF:  You have otitis media only in one ear, or bleeding from your nose, or both.  You notice a lump on your neck.  You are not getting better in 3-5 days.  You feel worse instead of better. SEEK IMMEDIATE MEDICAL CARE IF:   You have pain that is not controlled with medicine.  You have swelling, redness, or pain around your ear or stiffness in your neck.  You notice that part of your face is paralyzed.  You notice that the bone behind your ear (mastoid) is tender when you touch it. MAKE SURE YOU:   Understand these instructions.  Will watch your condition.  Will get help right away if you are not doing well or get worse. Document Released:  11/11/2003 Document Revised: 06/22/2013 Document Reviewed: 09/02/2012 [facility] Patient Information 2015 [place], [place]. This information is not intended to replace advice given to you by your health care provider. Make sure you discuss any questions you have with your health care provider.  Cough, Adult  A cough is a reflex. It helps you clear your throat and airways. A cough can help heal your body. A cough can last 2 or 3 weeks (acute) or may last more than 8 weeks (chronic). Some common causes of a cough can include an infection, allergy, or a cold. HOME CARE  Only take medicine as told by your doctor.  If given, take your medicines (antibiotics) as told. Finish them even if you start to feel better.  Use a cold steam vaporizer or humidifier in your home. This can help loosen thick spit (secretions).  Sleep so you are almost sitting up (semi-upright). Use pillows to do this. This helps reduce coughing.  Rest as needed.  Stop smoking if you smoke. GET HELP RIGHT AWAY IF:  You have yellowish-white fluid (pus) in your thick spit.  Your cough gets worse.  Your medicine does not reduce coughing, and you are losing sleep.  You cough up blood.  You have trouble breathing.  Your pain gets worse and medicine does not help.  You have a fever. MAKE SURE YOU:   Understand these instructions.  Will watch your condition.  Will get help right away if you are not doing well or  get worse. Document Released: 10/19/2010 Document Revised: 06/22/2013 Document Reviewed: 10/19/2010 [facility] Patient Information 2015 [place], [place]. This information is not intended to replace advice given to you by your health care provider. Make sure you discuss any questions you have with your health care provider.

## 2014-01-18 NOTE — ED Notes (Signed)
Pt c/o generalized aches, congestion, productive cough, sore throat, right earache since yesterday.

## 2014-01-18 NOTE — ED Notes (Signed)
Sick since yesterday, sore throat, rt ear pain,  Nasal congestion, and cough.

## 2014-01-20 NOTE — ED Provider Notes (Signed)
CSN: [id number]     Arrival date & time 01/18/14  2008 History   First MD Initiated Contact with Patient 01/18/14 2106     Chief Complaint  Patient presents with  . Cough  . Sore Throat  . Otalgia     (Consider location/radiation/quality/duration/timing/severity/associated sxs/prior Treatment) HPI   Breanna Malone is a 33 y.o. female who presents to the Emergency Department complaining of sore throat, cough and ear pain that began one day prior to ed arrival.  She states that cough is mostly non-productive.  She also reports having nasal congestion and pressure to her right ear.  She denies fever, vomiting, wheezing or chest pain.  She has been taking tylenol and ibuprofen without relief.  Nothing has made her symptoms better.     Past Medical History  Diagnosis Date  . Asthma   . Bronchitis   . High cholesterol   . Abnormal Pap smear   . Elevated cholesterol   . Hyperlipidemia   . Hyperlipoproteinemia   . Chest pain   . Bipolar 1 disorder   . Right ovarian cyst 10/31/2012    Complex right ovarian cyst seen 8/18 in ER need f/u US  . Carpal tunnel syndrome    Past Surgical History  Procedure Laterality Date  . Hernia repair     Family History  Problem Relation Age of Onset  . Asthma Other   . Bronchitis Other   . Diabetes Mother   . Hypertension Mother   . Asthma Father   . Bronchitis Daughter   . Asthma Daughter   . Stroke Paternal Grandmother   . Heart attack Paternal Grandmother   . Cancer Paternal Grandmother   . Asthma Son   . Bronchitis Son   . Asthma Daughter   . Bronchitis Daughter   . Asthma Son   . Cancer Cousin   . Cancer Maternal Uncle    History  Substance Use Topics  . Smoking status: Never Smoker   . Smokeless tobacco: Never Used  . Alcohol Use: No   OB History    Gravida Para Term Preterm AB TAB SAB Ectopic Multiple Living   7 6 6  1  1   4      Review of Systems  Constitutional: Negative for fever, chills, activity change and  appetite change.  HENT: Positive for congestion, ear pain, rhinorrhea and sore throat. Negative for facial swelling and trouble swallowing.   Eyes: Negative for visual disturbance.  Respiratory: Positive for cough. Negative for chest tightness, shortness of breath, wheezing and stridor.   Cardiovascular: Negative for chest pain.  Gastrointestinal: Negative for nausea and vomiting.  Genitourinary: Negative for dysuria and flank pain.  Musculoskeletal: Negative for neck pain and neck stiffness.  Skin: Negative.   Neurological: Negative for dizziness, weakness, numbness and headaches.  Hematological: Negative for adenopathy.  Psychiatric/Behavioral: Negative for confusion.  All other systems reviewed and are negative.     Allergies  Darvocet; Hydrocodone-acetaminophen; Lactose intolerance (gi); Other; Penicillins; Latex; Lortab; and Percocet  Home Medications   Prior to Admission medications   Medication Sig Start Date End Date Taking? Authorizing Provider  acetaminophen (TYLENOL) 500 MG tablet Take 500 mg by mouth every 6 (six) hours as needed for mild pain.    Yes Historical Provider, MD  Alum & Mag Hydroxide-Simeth (MAGIC MOUTHWASH W/LIDOCAINE) SOLN Take 5 mLs by mouth 3 (three) times daily as needed for mouth pain. 01/18/14   Maddyx Wieck L. Nilan Iddings, PA-C  azithromycin (ZITHROMAX) 250 MG  tablet Take 1 tablet (250 mg total) by mouth daily. Take first 2 tablets together, then 1 every day until finished. 01/18/14   Astraea Gaughran L. Johnette Teigen, PA-C  ibuprofen (ADVIL,MOTRIN) 600 MG tablet Take 1 tablet (600 mg total) by mouth every 6 (six) hours. Patient not taking: Reported on 01/18/2014 07/17/13   [name], MD  medroxyPROGESTERone (DEPO-PROVERA) 150 MG/ML injection Inject 1 mL (150 mg total) into the muscle every 3 (three) months. 08/17/13   Tawnya Crook, CNM  triamcinolone ointment (KENALOG) 0.5 % Apply 1 application topically 2 (two) times daily. Patient not taking: Reported on  01/18/2014 03/31/13   Florian Buff, MD   BP 116/56 mmHg  Pulse 85  Temp(Src) 99.1 F (37.3 C) (Oral)  Resp 20  Ht 5\' 4"  (1.626 m)  Wt 221 lb 8 oz (100.472 kg)  BMI 38.00 kg/m2  SpO2 95% Physical Exam  Constitutional: She is oriented to person, place, and time. She appears well-developed and well-nourished. No distress.  HENT:  Head: Normocephalic and atraumatic.  Right Ear: Ear canal normal. No mastoid tenderness. Tympanic membrane is erythematous. No hemotympanum.  Left Ear: Tympanic membrane and ear canal normal. No mastoid tenderness. No hemotympanum.  Nose: Mucosal edema and rhinorrhea present.  Mouth/Throat: Uvula is midline and mucous membranes are normal. Posterior oropharyngeal erythema present. No oropharyngeal exudate, posterior oropharyngeal edema or tonsillar abscesses.  Eyes: EOM are normal. Pupils are equal, round, and reactive to light.  Neck: Normal range of motion, full passive range of motion without pain and phonation normal. Neck supple.  Cardiovascular: Normal rate, regular rhythm, normal heart sounds and intact distal pulses.   No murmur heard. Pulmonary/Chest: Effort normal. No stridor. No respiratory distress. She has no wheezes. She has no rales. She exhibits no tenderness.  Coarse lungs sounds bilaterally that improve after cough  Musculoskeletal: She exhibits no edema.  Lymphadenopathy:    She has no cervical adenopathy.  Neurological: She is alert and oriented to person, place, and time. She exhibits normal muscle tone. Coordination normal.  Skin: Skin is warm and dry.  Nursing note and vitals reviewed.   ED Course  Procedures (including critical care time) Labs Review Labs Reviewed - No data to display  Imaging Review No results found.   EKG Interpretation None      MDM   Final diagnoses:  Other acute nonsuppurative otitis media of right ear  Cough    Pt is well appearing, VSS.  Agrees to pmd f/u , rx for zithromax and magic mouthwash.   Albuterol MDI dispensed. Cough likely viral    Phelan Schadt L. Vanessa Mullens, PA-C 01/20/14 1305  Carmin Muskrat, MD 01/20/14 [phone]

## 2014-01-26 ENCOUNTER — Encounter: Payer: Self-pay | Admitting: Women's Health

## 2014-01-26 ENCOUNTER — Ambulatory Visit: Payer: Medicaid Other

## 2014-01-26 ENCOUNTER — Other Ambulatory Visit (HOSPITAL_COMMUNITY)
Admission: RE | Admit: 2014-01-26 | Discharge: 2014-01-26 | Disposition: A | Discharge status: Home / Self Care | Payer: Medicaid Other | Source: Ambulatory Visit | Attending: Obstetrics & Gynecology | Admitting: Obstetrics & Gynecology

## 2014-01-26 ENCOUNTER — Ambulatory Visit (INDEPENDENT_AMBULATORY_CARE_PROVIDER_SITE_OTHER): Payer: Medicaid Other | Admitting: Women's Health

## 2014-01-26 VITALS — BP 132/72 | Ht 64.0 in | Wt 219.0 lb

## 2014-01-26 DIAGNOSIS — Z1151 Encounter for screening for human papillomavirus (HPV): Secondary | ICD-10-CM | POA: Diagnosis present

## 2014-01-26 DIAGNOSIS — R87810 Cervical high risk human papillomavirus (HPV) DNA test positive: Secondary | ICD-10-CM | POA: Diagnosis present

## 2014-01-26 DIAGNOSIS — Z113 Encounter for screening for infections with a predominantly sexual mode of transmission: Secondary | ICD-10-CM | POA: Diagnosis present

## 2014-01-26 DIAGNOSIS — Z01419 Encounter for gynecological examination (general) (routine) without abnormal findings: Secondary | ICD-10-CM

## 2014-01-26 DIAGNOSIS — Z0000 Encounter for general adult medical examination without abnormal findings: Secondary | ICD-10-CM

## 2014-01-26 DIAGNOSIS — Z01411 Encounter for gynecological examination (general) (routine) with abnormal findings: Secondary | ICD-10-CM | POA: Insufficient documentation

## 2014-01-26 DIAGNOSIS — Z3009 Encounter for other general counseling and advice on contraception: Secondary | ICD-10-CM

## 2014-01-26 DIAGNOSIS — Z3202 Encounter for pregnancy test, result negative: Secondary | ICD-10-CM

## 2014-01-26 LAB — CBC
HEMATOCRIT: 36.7 % (ref 36.0–46.0)
Hemoglobin: 12.8 g/dL (ref 12.0–15.0)
MCH: 30.9 pg (ref 26.0–34.0)
MCHC: 34.9 g/dL (ref 30.0–36.0)
MCV: 88.6 fL (ref 78.0–100.0)
MPV: 10.1 fL (ref 9.4–12.4)
PLATELETS: 347 10*3/uL (ref 150–400)
RBC: 4.14 MIL/uL (ref 3.87–5.11)
RDW: 12.4 % (ref 11.5–15.5)
WBC: 8.2 10*3/uL (ref 4.0–10.5)

## 2014-01-26 LAB — LIPID PANEL
CHOL/HDL RATIO: 5.7 ratio
CHOLESTEROL: 193 mg/dL (ref 0–200)
HDL: 34 mg/dL — AB (ref >39)
LDL Cholesterol: 118 mg/dL — ABNORMAL HIGH (ref 0–99)
TRIGLYCERIDES: 204 mg/dL — AB (ref <150)
VLDL: 41 mg/dL — AB (ref 0–40)

## 2014-01-26 LAB — COMPREHENSIVE METABOLIC PANEL
ALBUMIN: 4.1 g/dL (ref 3.5–5.2)
ALK PHOS: 102 U/L (ref 39–117)
ALT: 25 U/L (ref 0–35)
AST: 29 U/L (ref 0–37)
BILIRUBIN TOTAL: 0.5 mg/dL (ref 0.2–1.2)
BUN: 6 mg/dL (ref 6–23)
CO2: 30 mEq/L (ref 19–32)
Calcium: 9.6 mg/dL (ref 8.4–10.5)
Chloride: 101 mEq/L (ref 96–112)
Creat: 0.75 mg/dL (ref 0.50–1.10)
Glucose, Bld: 92 mg/dL (ref 70–99)
Potassium: 3.5 mEq/L (ref 3.5–5.3)
Sodium: 139 mEq/L (ref 135–145)
Total Protein: 7 g/dL (ref 6.0–8.3)

## 2014-01-26 LAB — POCT URINE PREGNANCY: Preg Test, Ur: NEGATIVE

## 2014-01-26 LAB — TSH: TSH: 0.69 u[IU]/mL (ref 0.350–4.500)

## 2014-01-26 NOTE — Patient Instructions (Signed)
For your lower back pain you may:  Take warm baths  Use a heating pad to your lower back for no longer than 20 minutes at a time, and do not place near abdomen  Take tylenol or ibuprofen as needed. Please follow directions on the bottle
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
## 2014-01-26 NOTE — Addendum Note (Signed)
Addended by: Traci Sermon A on: 01/26/2014 10:04 AM   Modules accepted: Orders

## 2014-01-27 ENCOUNTER — Telehealth: Payer: Self-pay | Admitting: Women's Health

## 2014-01-27 LAB — CYTOLOGY - PAP

## 2014-01-27 NOTE — Telephone Encounter (Signed)
Notified of elevated lipid panel. Recommended weight loss, exercise, limit concentrated sugars. Eat lots of fruits, vegs, whole grains, beans, nuts. Very little red meat. Will recheck in 3 months when comes for next depo.  Roma Schanz, CNM, [facility] 01/27/2014 12:18 PM

## 2014-01-28 ENCOUNTER — Ambulatory Visit (INDEPENDENT_AMBULATORY_CARE_PROVIDER_SITE_OTHER): Payer: Medicaid Other | Admitting: Obstetrics and Gynecology

## 2014-01-28 DIAGNOSIS — Z3200 Encounter for pregnancy test, result unknown: Secondary | ICD-10-CM

## 2014-01-28 DIAGNOSIS — Z3042 Encounter for surveillance of injectable contraceptive: Secondary | ICD-10-CM

## 2014-01-28 DIAGNOSIS — Z3202 Encounter for pregnancy test, result negative: Secondary | ICD-10-CM

## 2014-01-28 LAB — POCT URINE PREGNANCY: Preg Test, Ur: NEGATIVE

## 2014-01-28 MED ORDER — MEDROXYPROGESTERONE ACETATE 150 MG/ML IM SUSP
150.0000 mg | Freq: Once | INTRAMUSCULAR | Status: AC
  Administered 2014-01-28: 150 mg via INTRAMUSCULAR

## 2014-02-01 ENCOUNTER — Telehealth: Payer: Self-pay | Admitting: Women's Health

## 2014-02-01 DIAGNOSIS — R87610 Atypical squamous cells of undetermined significance on cytologic smear of cervix (ASC-US): Secondary | ICD-10-CM

## 2014-02-01 NOTE — Telephone Encounter (Signed)
Notified pt of abnormal pap and need for colpo, switched to front to be scheduled.  Roma Schanz, CNM, [facility] 02/01/2014 1:42 PM

## 2014-02-04 ENCOUNTER — Encounter: Payer: Medicaid Other | Admitting: Obstetrics and Gynecology

## 2014-02-17 ENCOUNTER — Encounter: Payer: Self-pay | Admitting: Obstetrics and Gynecology

## 2014-02-17 ENCOUNTER — Encounter: Payer: Medicaid Other | Admitting: Obstetrics and Gynecology

## 2014-02-24 ENCOUNTER — Encounter: Payer: Medicaid Other | Admitting: Obstetrics and Gynecology

## 2014-02-24 ENCOUNTER — Ambulatory Visit (INDEPENDENT_AMBULATORY_CARE_PROVIDER_SITE_OTHER): Payer: Medicaid Other | Admitting: Obstetrics and Gynecology

## 2014-02-24 VITALS — BP 120/80 | Ht 64.0 in

## 2014-02-24 DIAGNOSIS — A630 Anogenital (venereal) warts: Secondary | ICD-10-CM

## 2014-02-24 DIAGNOSIS — Z3202 Encounter for pregnancy test, result negative: Secondary | ICD-10-CM

## 2014-02-24 DIAGNOSIS — R87610 Atypical squamous cells of undetermined significance on cytologic smear of cervix (ASC-US): Secondary | ICD-10-CM

## 2014-02-24 DIAGNOSIS — Z3200 Encounter for pregnancy test, result unknown: Secondary | ICD-10-CM

## 2014-02-24 LAB — POCT URINE PREGNANCY: Preg Test, Ur: NEGATIVE

## 2014-02-24 NOTE — Progress Notes (Signed)
  Breanna Malone 34 y.o. [id number] here for colposcopy for ASCUS with POSITIVE high risk HPV pap smear on 01/26/14.  Discussed role for HPV in cervical dysplasia, need for surveillance.  Patient given informed consent, signed copy in the chart, time out was performed.  Placed in lithotomy position. Cervix viewed with speculum and colposcope after application of acetic acid.   Colposcopy adequate? 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 Will follow up pathology and manage accordingly.  Routine preventative health maintenance measures emphasized.  Assessment:  1. Colposcopy 2. Condyloma of the vaginal entrance   Plan:  1. F/U in 2-3 weeks for ROS and condyloma 2. Repeat pap 1 yr. This chart was scribed for Jonnie Kind, MD by Steva Colder, ED Scribe. The patient was seen in room 2 at 3:56 PM.

## 2014-03-10 ENCOUNTER — Telehealth: Payer: Self-pay | Admitting: *Deleted

## 2014-03-10 ENCOUNTER — Ambulatory Visit: Payer: Medicaid Other | Admitting: Obstetrics and Gynecology

## 2014-03-10 ENCOUNTER — Encounter: Payer: Self-pay | Admitting: Obstetrics and Gynecology

## 2014-03-11 NOTE — Telephone Encounter (Signed)
Pt reminded of missed appointment yesterday. Phone call switched to front office to reschedule her appointment.

## 2014-03-17 ENCOUNTER — Ambulatory Visit: Payer: Medicaid Other | Admitting: Obstetrics and Gynecology

## 2014-03-17 ENCOUNTER — Encounter: Payer: Self-pay | Admitting: *Deleted

## 2014-04-14 ENCOUNTER — Ambulatory Visit: Payer: Medicaid Other | Admitting: Obstetrics and Gynecology

## 2014-04-22 ENCOUNTER — Ambulatory Visit (INDEPENDENT_AMBULATORY_CARE_PROVIDER_SITE_OTHER): Payer: Medicaid Other | Admitting: *Deleted

## 2014-04-22 ENCOUNTER — Encounter: Payer: Self-pay | Admitting: *Deleted

## 2014-04-22 DIAGNOSIS — Z3042 Encounter for surveillance of injectable contraceptive: Secondary | ICD-10-CM | POA: Diagnosis not present

## 2014-04-22 DIAGNOSIS — Z3202 Encounter for pregnancy test, result negative: Secondary | ICD-10-CM

## 2014-04-22 LAB — POCT URINE PREGNANCY: Preg Test, Ur: NEGATIVE

## 2014-04-22 MED ORDER — MEDROXYPROGESTERONE ACETATE 150 MG/ML IM SUSP
150.0000 mg | Freq: Once | INTRAMUSCULAR | Status: AC
  Administered 2014-04-22: 150 mg via INTRAMUSCULAR

## 2014-04-22 NOTE — Progress Notes (Signed)
Pt here for Depo. Reports no problems at this time. Return in 12 weeks for next shot. JSY 

## 2014-04-28 ENCOUNTER — Ambulatory Visit: Payer: Medicaid Other | Admitting: Obstetrics and Gynecology

## 2014-04-30 ENCOUNTER — Ambulatory Visit (INDEPENDENT_AMBULATORY_CARE_PROVIDER_SITE_OTHER): Payer: Medicaid Other | Admitting: Obstetrics and Gynecology

## 2014-04-30 ENCOUNTER — Encounter (HOSPITAL_COMMUNITY): Payer: Self-pay | Admitting: Emergency Medicine

## 2014-04-30 ENCOUNTER — Emergency Department (HOSPITAL_COMMUNITY): Payer: Medicaid Other

## 2014-04-30 ENCOUNTER — Emergency Department (HOSPITAL_COMMUNITY)
Admission: EM | Admit: 2014-04-30 | Discharge: 2014-04-30 | Disposition: A | Discharge status: Home / Self Care | Payer: Medicaid Other | Attending: Emergency Medicine | Admitting: Emergency Medicine

## 2014-04-30 ENCOUNTER — Encounter: Payer: Self-pay | Admitting: Obstetrics and Gynecology

## 2014-04-30 VITALS — BP 120/80 | HR 80 | Ht 64.0 in | Wt 215.0 lb

## 2014-04-30 DIAGNOSIS — Z8659 Personal history of other mental and behavioral disorders: Secondary | ICD-10-CM | POA: Insufficient documentation

## 2014-04-30 DIAGNOSIS — J40 Bronchitis, not specified as acute or chronic: Secondary | ICD-10-CM

## 2014-04-30 DIAGNOSIS — Z91040 Latex allergy status: Secondary | ICD-10-CM | POA: Diagnosis not present

## 2014-04-30 DIAGNOSIS — Z8639 Personal history of other endocrine, nutritional and metabolic disease: Secondary | ICD-10-CM | POA: Diagnosis not present

## 2014-04-30 DIAGNOSIS — Z8742 Personal history of other diseases of the female genital tract: Secondary | ICD-10-CM | POA: Insufficient documentation

## 2014-04-30 DIAGNOSIS — J45901 Unspecified asthma with (acute) exacerbation: Secondary | ICD-10-CM | POA: Diagnosis not present

## 2014-04-30 DIAGNOSIS — R05 Cough: Secondary | ICD-10-CM | POA: Diagnosis present

## 2014-04-30 DIAGNOSIS — Z880 Allergy status to penicillin: Secondary | ICD-10-CM | POA: Diagnosis not present

## 2014-04-30 DIAGNOSIS — Z79899 Other long term (current) drug therapy: Secondary | ICD-10-CM | POA: Diagnosis not present

## 2014-04-30 DIAGNOSIS — A630 Anogenital (venereal) warts: Secondary | ICD-10-CM

## 2014-04-30 DIAGNOSIS — H6501 Acute serous otitis media, right ear: Secondary | ICD-10-CM

## 2014-04-30 MED ORDER — ALBUTEROL SULFATE HFA 108 (90 BASE) MCG/ACT IN AERS: 1.0000 | INHALATION_SPRAY | Freq: Four times a day (QID) | RESPIRATORY_TRACT | Status: DC | PRN

## 2014-04-30 MED ORDER — PREDNISONE 20 MG PO TABS: 40.0000 mg | ORAL_TABLET | Freq: Every day | ORAL | Status: DC

## 2014-04-30 MED ORDER — AZITHROMYCIN 250 MG PO TABS
500.0000 mg | ORAL_TABLET | Freq: Once | ORAL | Status: AC
  Administered 2014-04-30: 500 mg via ORAL
  Filled 2014-04-30: qty 2

## 2014-04-30 MED ORDER — PREDNISONE 20 MG PO TABS
40.0000 mg | ORAL_TABLET | Freq: Once | ORAL | Status: AC
  Administered 2014-04-30: 40 mg via ORAL
  Filled 2014-04-30: qty 2

## 2014-04-30 MED ORDER — IPRATROPIUM-ALBUTEROL 0.5-2.5 (3) MG/3ML IN SOLN
3.0000 mL | Freq: Once | RESPIRATORY_TRACT | Status: AC
  Administered 2014-04-30: 3 mL via RESPIRATORY_TRACT
  Filled 2014-04-30: qty 3

## 2014-04-30 MED ORDER — AZITHROMYCIN 250 MG PO TABS: 250.0000 mg | ORAL_TABLET | Freq: Every day | ORAL | Status: DC

## 2014-04-30 NOTE — Progress Notes (Signed)
Patient ID: Breanna Malone, female   DOB: 1980-04-30, 34 y.o.   MRN: [id number] Pt here today for TCA treatment. Patient identified, informed consent signed and copy in chart, time out performed.    Areas of typical appearing genital warts noted at the posterior fourchette..  1 cm x 90mm  TCA applied until warts had whi appearance.   Patient tolerated the procedure well.    Post procedure instructions given and patient told to wash area in thirty minutes.  Return in 3 week for next treatment.

## 2014-04-30 NOTE — ED Notes (Signed)
Pt c/o cough, sob and rt ear pain.

## 2014-04-30 NOTE — Discharge Instructions (Signed)
Continue to take the Robitussin cough medication in addition to the medications we give you. Follow up with Dr.Fanta. Return here as needed.

## 2014-04-30 NOTE — ED Provider Notes (Signed)
CSN: [id number]     Arrival date & time 04/30/14  0020 History   First MD Initiated Contact with Patient 04/30/14 0036     Chief Complaint  Patient presents with  . Cough     (Consider location/radiation/quality/duration/timing/severity/associated sxs/prior Treatment) Patient is a 34 y.o. female presenting with cough. The history is provided by the patient.  Cough Cough characteristics:  Productive Sputum characteristics:  Yellow Severity:  Moderate Onset quality:  Gradual Duration:  2 days Timing:  Intermittent Progression:  Worsening Chronicity:  New Smoker: no   Context: weather changes   Relieved by:  Nothing Ineffective treatments: Robitussin. Associated symptoms: ear pain, shortness of breath and wheezing    Breanna Malone is a 34 y.o. female who presents to the ED with cough and congestion that started 2 days ago. She reports that her throat is irritated from coughing. She is taking Robitussin without relief.  She denies fever, n/v or diarrhea.  Past Medical History  Diagnosis Date  . Asthma   . Bronchitis   . High cholesterol   . Abnormal Pap smear   . Elevated cholesterol   . Hyperlipidemia   . Hyperlipoproteinemia   . Chest pain   . Bipolar 1 disorder   . Right ovarian cyst 10/31/2012    Complex right ovarian cyst seen 8/18 in ER need f/u US  . Carpal tunnel syndrome    Past Surgical History  Procedure Laterality Date  . Hernia repair     Family History  Problem Relation Age of Onset  . Asthma Other   . Bronchitis Other   . Diabetes Mother   . Hypertension Mother   . Asthma Father   . Bronchitis Daughter   . Asthma Daughter   . Stroke Paternal Grandmother   . Heart attack Paternal Grandmother   . Cancer Paternal Grandmother   . Asthma Son   . Bronchitis Son   . Asthma Daughter   . Bronchitis Daughter   . Asthma Son   . Cancer Cousin   . Cancer Maternal Uncle    History  Substance Use Topics  . Smoking status: Never Smoker   . Smokeless  tobacco: Never Used  . Alcohol Use: No   OB History    Gravida Para Term Preterm AB TAB SAB Ectopic Multiple Living   7 6 6  1  1   4      Review of Systems  HENT: Positive for ear pain.   Respiratory: Positive for cough, shortness of breath and wheezing.   all other systems negative    Allergies  Darvocet; Hydrocodone-acetaminophen; Lactose intolerance (gi); Other; Penicillins; Latex; Lortab; and Percocet  Home Medications   Prior to Admission medications   Medication Sig Start Date End Date Taking? Authorizing Provider  albuterol (PROVENTIL HFA;VENTOLIN HFA) 108 (90 BASE) MCG/ACT inhaler Inhale 1-2 puffs into the lungs every 6 (six) hours as needed for wheezing or shortness of breath. 04/30/14   Hope Bunnie Pion, NP  azithromycin (ZITHROMAX Z-PAK) 250 MG tablet Take 1 tablet (250 mg total) by mouth daily. 04/30/14   Hope Bunnie Pion, NP  medroxyPROGESTERone (DEPO-PROVERA) 150 MG/ML injection Inject 1 mL (150 mg total) into the muscle every 3 (three) months. 08/17/13   Roma Schanz, CNM  predniSONE (DELTASONE) 20 MG tablet Take 2 tablets (40 mg total) by mouth daily with breakfast. 04/30/14   Hope Bunnie Pion, NP   BP 130/88 mmHg  Pulse 92  Temp(Src) 99.4 F (37.4 C)  Resp 20  Ht 5\' 4"  (1.626 m)  Wt 217 lb (98.431 kg)  BMI 37.23 kg/m2  SpO2 99% Physical Exam  Constitutional: She is oriented to person, place, and time. She appears well-developed and well-nourished.  HENT:  Head: Normocephalic and atraumatic.  Right Ear: Tympanic membrane is erythematous.  Left Ear: Tympanic membrane normal.  Nose: Nose normal.  Mouth/Throat: Uvula is midline, oropharynx is clear and moist and mucous membranes are normal.  Eyes: Conjunctivae and EOM are normal. Pupils are equal, round, and reactive to light.  Neck: Normal range of motion. Neck supple.  Pulmonary/Chest: Effort normal. No respiratory distress. Wheezes: occasional. She has no rales.  Abdominal: Soft. Bowel sounds are normal. There is  no tenderness.  Musculoskeletal: Normal range of motion.  Neurological: She is alert and oriented to person, place, and time. No cranial nerve deficit.  Skin: Skin is warm and dry.  Psychiatric: She has a normal mood and affect. Her behavior is normal.  Nursing note and vitals reviewed.   ED Course  Procedures (including critical care time) Labs Review Dg Chest 2 View  04/30/2014   CLINICAL DATA:  Productive cough, shortness of breath, and right ear pain for 2 days. Chest pain when coughing.  EXAM: CHEST  2 VIEW  COMPARISON:  08/18/2012  FINDINGS: Normal heart size and pulmonary vascularity. No focal airspace disease or consolidation in the lungs. No blunting of costophrenic angles. No pneumothorax. Mediastinal contours appear intact. Calcified left breast implant.  IMPRESSION: No active cardiopulmonary disease.   Electronically Signed   By: Lucienne Capers M.D.   On: 04/30/2014 01:27    MDM  34 y.o. female with right ear pain, cough and congestion x 3 days. Stable for d/c without respiratory distress. O2 SAT 99% on R/A. Will treat for bronchitis and ear infection. Discussed with the patient and all questioned fully answered. She voices understanding and agrees with plan.  Zithromax 500 mg. PO and prednisone 40 mg PO given prior to d/c and Rx to continue at home.   Final diagnoses:  Bronchitis  Otitis media, serous, acute, without rupture, right       [facility], NP 04/30/14 0136  Veryl Speak, MD 04/30/14 [id number]

## 2014-05-21 ENCOUNTER — Ambulatory Visit: Payer: Medicaid Other | Admitting: Obstetrics and Gynecology

## 2014-05-21 ENCOUNTER — Encounter: Payer: Self-pay | Admitting: *Deleted

## 2014-06-07 ENCOUNTER — Ambulatory Visit: Payer: Medicaid Other | Admitting: Obstetrics and Gynecology

## 2014-06-07 ENCOUNTER — Encounter: Payer: Self-pay | Admitting: *Deleted

## 2014-06-23 ENCOUNTER — Ambulatory Visit (INDEPENDENT_AMBULATORY_CARE_PROVIDER_SITE_OTHER): Payer: Medicaid Other | Admitting: Obstetrics and Gynecology

## 2014-06-23 VITALS — BP 118/70 | Ht 64.0 in | Wt 217.5 lb

## 2014-06-23 DIAGNOSIS — A630 Anogenital (venereal) warts: Secondary | ICD-10-CM | POA: Diagnosis not present

## 2014-06-23 NOTE — Progress Notes (Signed)
Patient ID: Breanna Malone, female   DOB: 1980-03-19, 34 y.o.   MRN: [id number]  Pt here today for TCA treatment. Patient identified, informed consent signed and copy in chart, time out performed.   Areas of typical appearing genital warts noted at the posterior fourchette.. 1 cm x 52mm TCA applied until warts had white appearance.   Patient tolerated the procedure well.   Post procedure instructions given and patient told to wash area in thirty minutes.  Return in 4 weeks for recheck.   This chart was SCRIBED for Mallory Shirk, MD by Stephania Fragmin, ED Scribe. This patient was seen in room 2 and the patient's care was started at 12:12 PM.  I personally performed the services described in this documentation, which was SCRIBED in my presence. The recorded information has been reviewed and considered accurate. It has been edited as necessary during review. Jonnie Kind, MD

## 2014-06-23 NOTE — Progress Notes (Signed)
Patient ID: Breanna Malone, female   DOB: 02/22/1980, 34 y.o.   MRN: [id number] Pt here today for follow up and possible TCA treatment.

## 2014-07-15 ENCOUNTER — Ambulatory Visit: Payer: Medicaid Other

## 2014-07-15 ENCOUNTER — Encounter: Payer: Self-pay | Admitting: Obstetrics & Gynecology

## 2014-07-26 ENCOUNTER — Encounter: Payer: Self-pay | Admitting: Obstetrics and Gynecology

## 2014-07-26 ENCOUNTER — Ambulatory Visit: Payer: Medicaid Other | Admitting: Obstetrics and Gynecology

## 2014-08-05 ENCOUNTER — Encounter: Payer: Self-pay | Admitting: *Deleted

## 2014-08-05 ENCOUNTER — Ambulatory Visit: Payer: Medicaid Other | Admitting: Obstetrics and Gynecology

## 2014-08-20 ENCOUNTER — Encounter: Payer: Self-pay | Admitting: Obstetrics and Gynecology

## 2014-08-20 ENCOUNTER — Ambulatory Visit: Payer: Medicaid Other | Admitting: Obstetrics and Gynecology

## 2014-08-27 ENCOUNTER — Ambulatory Visit: Payer: Medicaid Other | Admitting: Obstetrics and Gynecology

## 2014-08-27 ENCOUNTER — Encounter: Payer: Self-pay | Admitting: *Deleted

## 2014-08-31 ENCOUNTER — Telehealth: Payer: Self-pay | Admitting: Obstetrics and Gynecology

## 2014-09-01 ENCOUNTER — Telehealth: Payer: Self-pay | Admitting: Obstetrics and Gynecology

## 2014-09-01 NOTE — Telephone Encounter (Signed)
Spoke with pt and an appointment was made.

## 2014-09-01 NOTE — Telephone Encounter (Signed)
Pt states that she is having issues and needs to have a check up. Pt states that she has breast tenderness and she thinks she may have a problem with her hormones. Pt was advised to make an appointment. Phone call was switched up front and appointment was made.

## 2014-09-08 ENCOUNTER — Encounter: Payer: Self-pay | Admitting: Obstetrics and Gynecology

## 2014-09-08 ENCOUNTER — Ambulatory Visit (INDEPENDENT_AMBULATORY_CARE_PROVIDER_SITE_OTHER): Payer: Medicaid Other | Admitting: Obstetrics and Gynecology

## 2014-09-08 VITALS — BP 120/80 | Ht 64.0 in | Wt 215.0 lb

## 2014-09-08 DIAGNOSIS — A630 Anogenital (venereal) warts: Secondary | ICD-10-CM | POA: Insufficient documentation

## 2014-09-08 DIAGNOSIS — Z3009 Encounter for other general counseling and advice on contraception: Secondary | ICD-10-CM | POA: Diagnosis not present

## 2014-09-08 NOTE — Progress Notes (Signed)
Patient ID: Breanna Malone, female   DOB: 19-Oct-1980, 34 y.o.   MRN: [id number] Pt here today for TCA treatment. Pt denies any other problems or concerns at this time.

## 2014-09-08 NOTE — Progress Notes (Addendum)
Patient ID: Breanna Malone, female   DOB: 1981/01/01, 34 y.o.   MRN: [id number] TCA TX  OF WARTS, 2:  DISCUSSION OF CONTRACEPTION OPTIONS Patient states she is not cuurrently sexually active X1+YR.  Patient identified, informed consent signed and copy in chart, time out performed.   Areas of typical appearing genital warts noted posterior fourchette, .   TCA applied until warts had white appearance.   Patient tolerated the procedure well.   Post procedure instructions given and patient told to wash area in thirty minutes.  Return in 3 weeks for next treatment.    This chart was SCRIBED for Mallory Shirk, MD by Stephania Fragmin, ED Scribe. This patient was seen in room 1, and the patient's care was started at 3:07 PM.  I personally performed the services described in this documentation, which was SCRIBED in my presence. The recorded information has been reviewed and considered accurate. It has been edited as necessary during review. Jonnie Kind, MD     Additonally, the pt had discussion of contraception options for she and her daughter age 40.

## 2014-09-15 ENCOUNTER — Telehealth: Payer: Self-pay | Admitting: Obstetrics and Gynecology

## 2014-09-15 NOTE — Telephone Encounter (Signed)
Pt called wanting to come in and get her DEPO injection. Pt is very worried that she may be sexually active and wants to be covered by birth control. I spoke with Dr. Glo Herring and he advised that it would be fine. Pt was advised that she would need to come in and have her blood drawn in the morning and she would have to come back tomorrow afternoon for her DEPO injection. Pt was also advised that she would need to use condoms as back up. Pt verbalized understanding. Phone call was switched to front office and appointments were made.

## 2014-09-16 ENCOUNTER — Encounter: Payer: Self-pay | Admitting: Obstetrics & Gynecology

## 2014-09-16 ENCOUNTER — Ambulatory Visit: Payer: Medicaid Other

## 2014-09-16 ENCOUNTER — Other Ambulatory Visit: Payer: Medicaid Other

## 2014-09-27 ENCOUNTER — Encounter: Payer: Medicaid Other | Admitting: Obstetrics and Gynecology

## 2014-09-27 ENCOUNTER — Encounter: Payer: Self-pay | Admitting: Obstetrics and Gynecology

## 2014-10-11 ENCOUNTER — Ambulatory Visit: Payer: Medicaid Other

## 2014-10-11 ENCOUNTER — Other Ambulatory Visit: Payer: Medicaid Other

## 2014-11-29 ENCOUNTER — Telehealth: Payer: Self-pay | Admitting: Obstetrics and Gynecology

## 2014-11-29 NOTE — Telephone Encounter (Signed)
Pt states that she is nauseated, and she is having breast tenderness. Pt states that she had a period 11/04/14. Pt states that she had unprotected sex the 4-7. Pt states that she is just not feeling good.Pt states that she is stopped up. Pt has an appointment on Wednesday. Pt is concerned that she maybe pregnant. I advised the pt to push her fluids and get a decongestant and keep her appointment for Wednesday. Pt verbalized understanding

## 2014-12-01 ENCOUNTER — Encounter: Payer: Self-pay | Admitting: Advanced Practice Midwife

## 2014-12-01 ENCOUNTER — Ambulatory Visit: Payer: Medicaid Other | Admitting: Advanced Practice Midwife

## 2014-12-09 ENCOUNTER — Ambulatory Visit (HOSPITAL_COMMUNITY)
Admission: RE | Admit: 2014-12-09 | Discharge: 2014-12-09 | Disposition: A | Discharge status: Home / Self Care | Payer: Medicaid Other | Source: Ambulatory Visit | Attending: Internal Medicine | Admitting: Internal Medicine

## 2014-12-09 ENCOUNTER — Other Ambulatory Visit (HOSPITAL_COMMUNITY): Payer: Self-pay | Admitting: Internal Medicine

## 2014-12-09 DIAGNOSIS — M25461 Effusion, right knee: Secondary | ICD-10-CM | POA: Insufficient documentation

## 2014-12-09 DIAGNOSIS — M25562 Pain in left knee: Secondary | ICD-10-CM | POA: Diagnosis present

## 2014-12-28 ENCOUNTER — Telehealth: Payer: Self-pay | Admitting: Obstetrics and Gynecology

## 2014-12-28 NOTE — Telephone Encounter (Signed)
Pt states that she is had a regular period 11/30/14 and went through 12/12/14 and then started again yesterday. Pt states that the bleeding really heavy. Pt had sex this past weekend. Pt was advised that we would need to see her to discuss her irregular periods. Pt was also told to not have sex for the next 2 weeks so that we could get her nexplanon in. Pt was given an appointment and verbalized understanding.

## 2014-12-29 ENCOUNTER — Encounter: Payer: Self-pay | Admitting: Advanced Practice Midwife

## 2014-12-29 ENCOUNTER — Ambulatory Visit (INDEPENDENT_AMBULATORY_CARE_PROVIDER_SITE_OTHER): Payer: Medicaid Other | Admitting: Advanced Practice Midwife

## 2014-12-29 VITALS — BP 120/78 | HR 72 | Ht 64.0 in | Wt 210.0 lb

## 2014-12-29 DIAGNOSIS — Z3009 Encounter for other general counseling and advice on contraception: Secondary | ICD-10-CM

## 2014-12-29 DIAGNOSIS — Z3202 Encounter for pregnancy test, result negative: Secondary | ICD-10-CM

## 2014-12-29 DIAGNOSIS — Z309 Encounter for contraceptive management, unspecified: Secondary | ICD-10-CM | POA: Diagnosis not present

## 2014-12-29 LAB — POCT URINE PREGNANCY: Preg Test, Ur: NEGATIVE

## 2014-12-29 NOTE — Progress Notes (Signed)
[facility] Visit  Patient name: Breanna Malone MRN [id number]  Date of birth: 1980/09/11  CC & HPI:  Breanna Malone is a 34 y.o. African American female presenting today for being worried about period.  When actually discussing cycle, it ranged from 23-27 days the last 2 months. Thinks she can only get pregnant by one man and no other man in the world. can get her pregnant.  Firmly tried to dispell this myth. Pt plans NExplanon but not mentally prepared for it today. Discussed risks/SE/benefits. Started period 2 days ago.   Pertinent History Reviewed:  Medical & Surgical Hx:   Past Medical History  Diagnosis Date  . Asthma   . Bronchitis   . High cholesterol   . Abnormal Pap smear   . Elevated cholesterol   . Hyperlipidemia   . Hyperlipoproteinemia   . Chest pain   . Bipolar 1 disorder ([place])   . Right ovarian cyst 10/31/2012    Complex right ovarian cyst seen 8/18 in ER need f/u US  . Carpal tunnel syndrome    Past Surgical History  Procedure Laterality Date  . Hernia repair     Family History  Problem Relation Age of Onset  . Asthma Other   . Bronchitis Other   . Diabetes Mother   . Hypertension Mother   . Asthma Father   . Bronchitis Daughter   . Asthma Daughter   . Stroke Paternal Grandmother   . Heart attack Paternal Grandmother   . Cancer Paternal Grandmother   . Asthma Son   . Bronchitis Son   . Asthma Daughter   . Bronchitis Daughter   . Asthma Son   . Cancer Cousin   . Cancer Maternal Uncle     Current outpatient prescriptions:  .  albuterol (PROVENTIL HFA;VENTOLIN HFA) 108 (90 BASE) MCG/ACT inhaler, Inhale 1-2 puffs into the lungs every 6 (six) hours as needed for wheezing or shortness of breath. (Patient not taking: Reported on 12/29/2014), Disp: 1 Inhaler, Rfl: 0 .  [DISCONTINUED] potassium chloride SA (K-DUR,KLOR-CON) 20 MEQ tablet, Take 1 tablet (20 mEq total) by mouth 2 (two) times daily., Disp: 8 tablet, Rfl: 0 Social History: Reviewed -   reports that she has never smoked. She has never used smokeless tobacco.  Review of Systems:   Constitutional: Negative for fever and chills Eyes: Negative for visual disturbances Respiratory: Negative for shortness of breath, dyspnea Cardiovascular: Negative for chest pain or palpitations  Gastrointestinal: Negative for vomiting, diarrhea and constipation; no abdominal pain Genitourinary: Negative for dysuria and urgency, vaginal irritation or itching Musculoskeletal: Negative for back pain, joint pain, myalgias  Neurological: Negative for dizziness and headaches    Objective Findings:  Vitals: BP 120/78 mmHg  Pulse 72  Ht 5\' 4"  (1.626 m)  Wt 210 lb (95.255 kg)  BMI 36.03 kg/m2  LMP 11/30/2014 (Exact Date)  Physical Examination: General appearance - well appearing, and in no distress Mental status - alert, oriented to person, place, and time Chest:  Normal respiratory effort Heart - normal rate and regular rhythm Musculoskeletal:  Normal range of motion without pain Extremities:  No edema    Results for orders placed or performed in visit on 12/29/14 (from the past 24 hour(s))  POCT urine pregnancy   Collection Time: 12/29/14 11:48 AM  Result Value Ref Range   Preg Test, Ur Negative Negative      Assessment & Plan:  A:   Contraception counseling  50% or more  of this visit was spent in counseling and coordination of care.  10 minutes of face to face time.  P:No sex until nexplanon!!!     Return asap for nexplanon.  CRESENZO-DISHMAN,Syvilla Martin CNM 12/29/2014 2:47 PM

## 2014-12-31 LAB — URINE CULTURE

## 2015-01-04 ENCOUNTER — Encounter: Payer: Self-pay | Admitting: Advanced Practice Midwife

## 2015-01-04 ENCOUNTER — Encounter: Payer: Medicaid Other | Admitting: Advanced Practice Midwife

## 2015-01-04 MED ORDER — ERYTHROMYCIN BASE 500 MG PO TABS: 500.0000 mg | ORAL_TABLET | Freq: Four times a day (QID) | ORAL | Status: DC

## 2015-01-04 NOTE — Addendum Note (Signed)
Addended by: Christin Fudge on: 01/04/2015 11:42 AM   Modules accepted: Orders

## 2015-01-04 NOTE — Progress Notes (Signed)
+   GBS in urine. PCN allergic. Rx [employer]

## 2015-03-02 ENCOUNTER — Ambulatory Visit (INDEPENDENT_AMBULATORY_CARE_PROVIDER_SITE_OTHER): Payer: Medicaid Other | Admitting: Advanced Practice Midwife

## 2015-03-02 ENCOUNTER — Encounter: Payer: Self-pay | Admitting: Advanced Practice Midwife

## 2015-03-02 ENCOUNTER — Other Ambulatory Visit (HOSPITAL_COMMUNITY)
Admission: RE | Admit: 2015-03-02 | Discharge: 2015-03-02 | Disposition: A | Discharge status: Home / Self Care | Payer: Medicaid Other | Source: Ambulatory Visit | Attending: Advanced Practice Midwife | Admitting: Advanced Practice Midwife

## 2015-03-02 VITALS — BP 130/92 | HR 84 | Ht 64.0 in | Wt 216.0 lb

## 2015-03-02 DIAGNOSIS — Z01411 Encounter for gynecological examination (general) (routine) with abnormal findings: Secondary | ICD-10-CM | POA: Insufficient documentation

## 2015-03-02 DIAGNOSIS — Z113 Encounter for screening for infections with a predominantly sexual mode of transmission: Secondary | ICD-10-CM | POA: Insufficient documentation

## 2015-03-02 DIAGNOSIS — Z3200 Encounter for pregnancy test, result unknown: Secondary | ICD-10-CM

## 2015-03-02 DIAGNOSIS — Z0000 Encounter for general adult medical examination without abnormal findings: Secondary | ICD-10-CM

## 2015-03-02 DIAGNOSIS — Z1151 Encounter for screening for human papillomavirus (HPV): Secondary | ICD-10-CM | POA: Diagnosis not present

## 2015-03-02 DIAGNOSIS — Z01419 Encounter for gynecological examination (general) (routine) without abnormal findings: Secondary | ICD-10-CM | POA: Diagnosis not present

## 2015-03-02 MED ORDER — MEDROXYPROGESTERONE ACETATE 150 MG/ML IM SUSP: 150.0000 mg | INTRAMUSCULAR | Status: DC

## 2015-03-02 NOTE — Patient Instructions (Signed)
Basic Carbohydrate Counting for Diabetes Mellitus Carbohydrate counting is a method for keeping track of the amount of carbohydrates you eat. Eating carbohydrates naturally increases the level of sugar (glucose) in your blood, so it is important for you to know the amount that is okay for you to have in every meal. Carbohydrate counting helps keep the level of glucose in your blood within normal limits. The amount of carbohydrates allowed is different for every person. A dietitian can help you calculate the amount that is right for you. Once you know the amount of carbohydrates you can have, you can count the carbohydrates in the foods you want to eat. Carbohydrates are found in the following foods:  Grains, such as breads and cereals.  Dried beans and soy products.  Starchy vegetables, such as potatoes, peas, and corn.  Fruit and fruit juices.  Milk and yogurt.  Sweets and snack foods, such as cake, cookies, candy, chips, soft drinks, and fruit drinks. CARBOHYDRATE COUNTING There are two ways to count the carbohydrates in your food. You can use either of the methods or a combination of both. Reading the "Nutrition Facts" on [place] The "Nutrition Facts" is an area that is included on the labels of almost all packaged food and beverages in the [place]. It includes the serving size of that food or beverage and information about the nutrients in each serving of the food, including the grams (g) of carbohydrate per serving.  Decide the number of servings of this food or beverage that you will be able to eat or drink. Multiply that number of servings by the number of grams of carbohydrate that is listed on the label for that serving. The total will be the amount of carbohydrates you will be having when you eat or drink this food or beverage. Learning Standard Serving Sizes of Food When you eat food that is not packaged or does not include "Nutrition Facts" on the label, you need to  measure the servings in order to count the amount of carbohydrates.A serving of most carbohydrate-rich foods contains about 15 g of carbohydrates. The following list includes serving sizes of carbohydrate-rich foods that provide 15 g ofcarbohydrate per serving:   1 slice of bread (1 oz) or 1 six-inch tortilla.    of a hamburger bun or English muffin.  4-6 crackers.   cup unsweetened dry cereal.    cup hot cereal.   cup rice or pasta.    cup mashed potatoes or  of a large baked potato.  1 cup fresh fruit or one small piece of fruit.    cup canned or frozen fruit or fruit juice.  1 cup milk.   cup plain fat-free yogurt or yogurt sweetened with artificial sweeteners.   cup cooked dried beans or starchy vegetable, such as peas, corn, or potatoes.  Decide the number of standard-size servings that you will eat. Multiply that number of servings by 15 (the grams of carbohydrates in that serving). For example, if you eat 2 cups of strawberries, you will have eaten 2 servings and 30 g of carbohydrates (2 servings x 15 g = 30 g). For foods such as soups and casseroles, in which more than one food is mixed in, you will need to count the carbohydrates in each food that is included. EXAMPLE OF CARBOHYDRATE COUNTING Sample Dinner  3 oz chicken breast.   cup of brown rice.   cup of corn.  1 cup milk.   1 cup strawberries  with sugar-free whipped topping.  Carbohydrate Calculation Step 1: Identify the foods that contain carbohydrates:   Rice.   Corn.   Milk.   Strawberries. Step 2:Calculate the number of servings eaten of each:   2 servings of rice.   1 serving of corn.   1 serving of milk.   1 serving of strawberries. Step 3: Multiply each of those number of servings by 15 g:   2 servings of rice x 15 g = 30 g.   1 serving of corn x 15 g = 15 g.   1 serving of milk x 15 g = 15 g.   1 serving of strawberries x 15 g = 15 g. Step 4: Add  together all of the amounts to find the total grams of carbohydrates eaten: 30 g + 15 g + 15 g + 15 g = 75 g.   This information is not intended to replace advice given to you by your health care provider. Make sure you discuss any questions you have with your health care provider.   Document Released: 02/05/2005 Document Revised: 02/26/2014 Document Reviewed: 01/02/2013 Elsevier Interactive Patient Education 2016 [place] for Eating Away From Home If You Have Diabetes Controlling your level of blood glucose, also known as blood sugar, can be challenging. It can be even more difficult when you do not prepare your own meals. The following tips can help you manage your diabetes when you eat away from home. PLANNING AHEAD Plan ahead if you know you will be eating away from home:  Ask your health care provider how to time meals and medicine if you are taking insulin.  Make a list of restaurants near you that offer healthy choices. If they have a carry-out menu, take it home and plan what you will order ahead of time.  Look up the restaurant you want to eat at online. Many chain and fast-food restaurants list nutritional information online. Use this information to choose the healthiest options and to calculate how many carbohydrates will be in your meal.  Use a carbohydrate-counting book or mobile app to look up the carbohydrate content and serving size of the foods you want to eat.  Become familiar with serving sizes and learn to recognize how many servings are in a portion. This will allow you to estimate how many carbohydrates you can eat. FREE FOODS A "free food" is any food or drink that has less than 5 g of carbohydrates per serving. Free foods include:  Many vegetables.  Hard boiled eggs.  Nuts or seeds.  Olives.  Cheeses.  Meats. These types of foods make good appetizer choices and are often available at salad bars. Lemon juice, vinegar, or a low-calorie salad  dressing of fewer than 20 calories per serving can be used as a "free" salad dressing.  CHOICES TO REDUCE CARBOHYDRATES  Substitute nonfat sweetened yogurt with a sugar-free yogurt. Yogurt made from soy milk may also be used, but you will still want a sugar-free or plain option to choose a lower carbohydrate amount.  Ask your server to take away the bread basket or chips from your table.  Order fresh fruit. A salad bar often offers fresh fruit choices. Avoid canned fruit because it is usually packed in sugar or syrup.  Order a salad, and eat it without dressing. Or, create a "free" salad dressing.  Ask for substitutions. For example, instead of [place] fries, request an order of a vegetable such as  salad, green beans, or broccoli. OTHER TIPS   If you take insulin, take the insulin once your food arrives to your table. This will ensure your insulin and food are timed correctly.  Ask your server about the portion size before your order, and ask for a take-out box if the portion has more servings than you should have. When your food comes, leave the amount you should have on the plate, and put the rest in the take-out box.  Consider splitting an entree with someone and ordering a side salad.   This information is not intended to replace advice given to you by your health care provider. Make sure you discuss any questions you have with your health care provider.   Document Released: 02/05/2005 Document Revised: 10/27/2014 Document Reviewed: 05/05/2013 Elsevier Interactive Patient Education [employer].

## 2015-03-02 NOTE — Progress Notes (Signed)
Breanna Malone 35 y.o.  Filed Vitals:   03/02/15 1021  BP: 130/92  Pulse: 84     Past Medical History: Past Medical History  Diagnosis Date  . Asthma   . Bronchitis   . High cholesterol   . Abnormal Pap smear   . Elevated cholesterol   . Hyperlipidemia   . Hyperlipoproteinemia   . Chest pain   . Bipolar 1 disorder ([place])   . Right ovarian cyst 10/31/2012    Complex right ovarian cyst seen 8/18 in ER need f/u US  . Carpal tunnel syndrome   . Post traumatic stress disorder     Past Surgical History: Past Surgical History  Procedure Laterality Date  . Hernia repair      Family History: Family History  Problem Relation Age of Onset  . Asthma Other   . Bronchitis Other   . Diabetes Mother   . Hypertension Mother   . Asthma Father   . Bronchitis Daughter   . Asthma Daughter   . Stroke Paternal Grandmother   . Heart attack Paternal Grandmother   . Cancer Paternal Grandmother   . Asthma Son   . Bronchitis Son   . Asthma Daughter   . Bronchitis Daughter   . Asthma Son   . Cancer Cousin     Social History: Social History  Substance Use Topics  . Smoking status: Never Smoker   . Smokeless tobacco: Never Used  . Alcohol Use: No    Allergies:  Allergies  Allergen Reactions  . Darvocet [Propoxyphene N-Acetaminophen] Itching  . Hydrocodone-Acetaminophen Itching  . Lactose Intolerance (Gi) Other (See Comments)    Causes stomach cramping.  . Other Hives    Patient is allergic to corndogs.  . Penicillins Hives and Itching  . Latex Itching and Rash  . Lortab [Hydrocodone-Acetaminophen] Rash  . Percocet [Oxycodone-Acetaminophen] Itching and Rash      Current outpatient prescriptions:  .  albuterol (PROVENTIL HFA;VENTOLIN HFA) 108 (90 BASE) MCG/ACT inhaler, Inhale 1-2 puffs into the lungs every 6 (six) hours as needed for wheezing or shortness of breath., Disp: 1 Inhaler, Rfl: 0 .  PRESCRIPTION MEDICATION, Pt states b/p medication unsure of name, prescribed  by Dr. Legrand Rams, Disp: , Rfl:  .  [DISCONTINUED] potassium chloride SA (K-DUR,KLOR-CON) 20 MEQ tablet, Take 1 tablet (20 mEq total) by mouth 2 (two) times daily., Disp: 8 tablet, Rfl: 0  History of Present Illness: Here for pap and physical.  Last pap 1 year ago + HR HPV/[place]. Has a new partner--has painful sex sometimes, "feels like something's being bumped/"  C/O left breast tingling across towards nipple 2-3 times over the past 2 weeks.  Wants depo instead of nexplanon   May do nexplanon in 3 monhts.  Had unprotected sex last night.  Had a 25 days in bt cycles in December.   Review of Systems   Patient denies any headaches, blurred vision, shortness of breath, chest pain, abdominal pain, problems with bowel movements, urination  Physical Exam: General:  Well developed, well nourished, no acute distress Skin:  Warm and dry Neck:  Midline trachea, normal thyroid Lungs; Clear to auscultation bilaterally Breast:  No dominant palpable mass, retraction, or nipple discharge. No pain since last week. ? Nerve irritaiton Cardiovascular: Regular rate and rhythm Abdomen:  Soft, non tender, no hepatosplenomegaly Pelvic:  External genitalia is normal in appearance.  The vagina is normal in appearance.  The cervix is bulbous.  Uterus is felt to be normal size, shape,  and contour.  No adnexal masses or tenderness noted.  Extremities:  No swelling or varicosities noted Psych:  No mood changes.     Impression: normal gyn exam Mild vaginal dryness dysparuneia--seems to be when r/t dryness/position. Lube samples given, encourage foreplay    Plan: abstinence for 10 days, qhcg then depo.

## 2015-03-04 LAB — CYTOLOGY - PAP

## 2015-03-11 ENCOUNTER — Ambulatory Visit: Payer: Medicaid Other

## 2015-03-15 ENCOUNTER — Ambulatory Visit (INDEPENDENT_AMBULATORY_CARE_PROVIDER_SITE_OTHER): Payer: Medicaid Other | Admitting: *Deleted

## 2015-03-15 DIAGNOSIS — Z3202 Encounter for pregnancy test, result negative: Secondary | ICD-10-CM

## 2015-03-15 DIAGNOSIS — Z3042 Encounter for surveillance of injectable contraceptive: Secondary | ICD-10-CM

## 2015-03-15 DIAGNOSIS — N390 Urinary tract infection, site not specified: Secondary | ICD-10-CM

## 2015-03-15 DIAGNOSIS — Z3200 Encounter for pregnancy test, result unknown: Secondary | ICD-10-CM

## 2015-03-15 LAB — POCT URINALYSIS DIPSTICK
Blood, UA: 4
Glucose, UA: NEGATIVE
KETONES UA: NEGATIVE
Nitrite, UA: POSITIVE

## 2015-03-15 LAB — POCT URINE PREGNANCY: PREG TEST UR: NEGATIVE

## 2015-03-15 MED ORDER — MEDROXYPROGESTERONE ACETATE 150 MG/ML IM SUSP
150.0000 mg | Freq: Once | INTRAMUSCULAR | Status: AC
  Administered 2015-03-15: 150 mg via INTRAMUSCULAR

## 2015-03-15 NOTE — Progress Notes (Signed)
Patient ID: Breanna Malone, female   DOB: [birth year], 35 y.o.   MRN: [id number] Pt here today for DEPO injection. Pt had negative UPT today and tolerated injection well. Pt stated that she was having UTI symptoms and her urine was positive for nitrates and was sent to the lab for a culture.

## 2015-03-17 LAB — URINE CULTURE

## 2015-03-21 ENCOUNTER — Telehealth: Payer: Self-pay | Admitting: Adult Health

## 2015-03-21 MED ORDER — CIPROFLOXACIN HCL 500 MG PO TABS: 500.0000 mg | ORAL_TABLET | Freq: Two times a day (BID) | ORAL | Status: DC

## 2015-03-21 NOTE — Telephone Encounter (Signed)
Pt aware +UTI will rx cipro

## 2015-06-07 ENCOUNTER — Encounter: Payer: Self-pay | Admitting: Advanced Practice Midwife

## 2015-06-07 ENCOUNTER — Ambulatory Visit: Payer: Medicaid Other

## 2015-06-07 ENCOUNTER — Ambulatory Visit (INDEPENDENT_AMBULATORY_CARE_PROVIDER_SITE_OTHER): Payer: Medicaid Other | Admitting: Advanced Practice Midwife

## 2015-06-07 VITALS — BP 134/78 | HR 80 | Ht 64.0 in

## 2015-06-07 DIAGNOSIS — Z3042 Encounter for surveillance of injectable contraceptive: Secondary | ICD-10-CM

## 2015-06-07 DIAGNOSIS — Z3202 Encounter for pregnancy test, result negative: Secondary | ICD-10-CM

## 2015-06-07 DIAGNOSIS — A5901 Trichomonal vulvovaginitis: Secondary | ICD-10-CM | POA: Diagnosis not present

## 2015-06-07 LAB — POCT URINE PREGNANCY: PREG TEST UR: NEGATIVE

## 2015-06-07 MED ORDER — MEDROXYPROGESTERONE ACETATE 150 MG/ML IM SUSP
150.0000 mg | Freq: Once | INTRAMUSCULAR | Status: AC
  Administered 2015-06-07: 150 mg via INTRAMUSCULAR

## 2015-06-07 MED ORDER — METRONIDAZOLE 500 MG PO TABS: 500.0000 mg | ORAL_TABLET | Freq: Two times a day (BID) | ORAL | Status: DC

## 2015-06-07 NOTE — Progress Notes (Signed)
[facility] Visit  Patient name: Breanna Malone MRN [id number]  Date of birth: 16-Oct-1980  CC & HPI:  Breanna Malone is a 34 y.o. African American female presenting today for Trichomonas tx. States a man she had sex with told her he had it.  No sx. Got depo today.  Plans Nexplanon in 2 months.    Pertinent History Reviewed:  Medical & Surgical Hx:   Past Medical History  Diagnosis Date  . Asthma   . Bronchitis   . High cholesterol   . Abnormal Pap smear   . Elevated cholesterol   . Hyperlipidemia   . Hyperlipoproteinemia   . Chest pain   . Bipolar 1 disorder ([place])   . Right ovarian cyst 10/31/2012    Complex right ovarian cyst seen 8/18 in ER need f/u US  . Carpal tunnel syndrome   . Post traumatic stress disorder    Past Surgical History  Procedure Laterality Date  . Hernia repair     Family History  Problem Relation Age of Onset  . Asthma Other   . Bronchitis Other   . Diabetes Mother   . Hypertension Mother   . Asthma Father   . Bronchitis Daughter   . Asthma Daughter   . Stroke Paternal Grandmother   . Heart attack Paternal Grandmother   . Cancer Paternal Grandmother   . Asthma Son   . Bronchitis Son   . Asthma Daughter   . Bronchitis Daughter   . Asthma Son   . Cancer Cousin     Current outpatient prescriptions:  .  albuterol (PROVENTIL HFA;VENTOLIN HFA) 108 (90 BASE) MCG/ACT inhaler, Inhale 1-2 puffs into the lungs every 6 (six) hours as needed for wheezing or shortness of breath., Disp: 1 Inhaler, Rfl: 0 .  medroxyPROGESTERone (DEPO-PROVERA) 150 MG/ML injection, Inject 1 mL (150 mg total) into the muscle every 3 (three) months., Disp: 1 mL, Rfl: 3 .  PRESCRIPTION MEDICATION, Pt states b/p medication unsure of name, prescribed by Dr. Legrand Rams, Disp: , Rfl:  .  [DISCONTINUED] potassium chloride SA (K-DUR,KLOR-CON) 20 MEQ tablet, Take 1 tablet (20 mEq total) by mouth 2 (two) times daily., Disp: 8 tablet, Rfl: 0 Social History: Reviewed -  reports  that she has never smoked. She has never used smokeless tobacco.  Review of Systems:   Constitutional: Negative for fever and chills Eyes: Negative for visual disturbances Respiratory: Negative for shortness of breath, dyspnea Cardiovascular: Negative for chest pain or palpitations  Gastrointestinal: Negative for vomiting, diarrhea and constipation; no abdominal pain Genitourinary: Negative for dysuria and urgency, vaginal irritation or itching Musculoskeletal: Negative for back pain, joint pain, myalgias  Neurological: Negative for dizziness and headaches    Objective Findings:    Physical Examination: General appearance - well appearing, and in no distress Mental status - alert, oriented to person, place, and time Chest:  Normal respiratory effort Heart - normal rate and regular rhythm Abdomen:  Soft, nontender Pelvic: SSE:  Yellow dc; wet prep + trich/WBC.  No yeast, clue Musculoskeletal:  Normal range of motion without pain Extremities:  No edema    Results for orders placed or performed in visit on 06/07/15 (from the past 24 hour(s))  POCT urine pregnancy   Collection Time: 06/07/15  2:35 PM  Result Value Ref Range   Preg Test, Ur Negative Negative      Assessment & Plan:  A:   Trichomonas P:  Rx Flagyl 500mg  BID X 7 (  recent study shows more effective than 2gm dose)   Return in about 2 months (around 08/07/2015) for nexplanon (we have device).  CRESENZO-DISHMAN,Estell Dillinger CNM 06/07/2015 2:52 PM

## 2015-07-01 LAB — BASIC METABOLIC PANEL: Glucose: 115 mg/dL

## 2015-08-10 ENCOUNTER — Encounter: Payer: Medicaid Other | Admitting: Advanced Practice Midwife

## 2015-08-17 ENCOUNTER — Ambulatory Visit: Payer: Medicaid Other

## 2015-08-24 ENCOUNTER — Ambulatory Visit: Payer: Medicaid Other

## 2015-08-29 ENCOUNTER — Ambulatory Visit (INDEPENDENT_AMBULATORY_CARE_PROVIDER_SITE_OTHER): Payer: Medicaid Other

## 2015-08-29 VITALS — Wt 234.0 lb

## 2015-08-29 DIAGNOSIS — Z3042 Encounter for surveillance of injectable contraceptive: Secondary | ICD-10-CM | POA: Diagnosis not present

## 2015-08-29 DIAGNOSIS — Z3202 Encounter for pregnancy test, result negative: Secondary | ICD-10-CM | POA: Diagnosis not present

## 2015-08-29 LAB — POCT URINE PREGNANCY: PREG TEST UR: NEGATIVE

## 2015-08-29 MED ORDER — MEDROXYPROGESTERONE ACETATE 150 MG/ML IM SUSP
150.0000 mg | Freq: Once | INTRAMUSCULAR | Status: AC
  Administered 2015-08-29: 150 mg via INTRAMUSCULAR

## 2015-08-29 NOTE — Progress Notes (Signed)
Patient received Depo Shot on 08-29-15, Lt Deltoid. Tolerated well. Appointed for shot in 3 months

## 2015-08-29 NOTE — Addendum Note (Signed)
Addended by: Diona Fanti A on: 08/29/2015 12:35 PM   Modules accepted: Orders

## 2015-11-21 ENCOUNTER — Encounter: Payer: Self-pay | Admitting: *Deleted

## 2015-11-21 ENCOUNTER — Ambulatory Visit: Payer: Medicaid Other

## 2015-12-15 ENCOUNTER — Ambulatory Visit: Payer: Medicaid Other

## 2015-12-15 ENCOUNTER — Other Ambulatory Visit: Payer: Self-pay | Admitting: Obstetrics & Gynecology

## 2015-12-15 ENCOUNTER — Other Ambulatory Visit: Payer: Medicaid Other

## 2015-12-19 ENCOUNTER — Encounter: Payer: Self-pay | Admitting: *Deleted

## 2015-12-19 ENCOUNTER — Ambulatory Visit (INDEPENDENT_AMBULATORY_CARE_PROVIDER_SITE_OTHER): Payer: Medicaid Other | Admitting: *Deleted

## 2015-12-19 ENCOUNTER — Other Ambulatory Visit: Payer: Medicaid Other

## 2015-12-19 DIAGNOSIS — Z309 Encounter for contraceptive management, unspecified: Secondary | ICD-10-CM

## 2015-12-19 DIAGNOSIS — Z308 Encounter for other contraceptive management: Secondary | ICD-10-CM

## 2015-12-19 LAB — BETA HCG QUANT (REF LAB)

## 2015-12-19 MED ORDER — MEDROXYPROGESTERONE ACETATE 150 MG/ML IM SUSP
150.0000 mg | Freq: Once | INTRAMUSCULAR | Status: AC
  Administered 2015-12-19: 150 mg via INTRAMUSCULAR

## 2015-12-19 NOTE — Progress Notes (Signed)
Pt here for Depo. Neg quant this am. Pt tolerated shot well. Return in 12 weeks for next shot. [place]

## 2016-03-12 ENCOUNTER — Ambulatory Visit: Payer: Medicaid Other

## 2016-03-12 ENCOUNTER — Encounter: Payer: Self-pay | Admitting: *Deleted

## 2016-03-12 ENCOUNTER — Ambulatory Visit (INDEPENDENT_AMBULATORY_CARE_PROVIDER_SITE_OTHER): Payer: Medicaid Other | Admitting: *Deleted

## 2016-03-12 DIAGNOSIS — Z3202 Encounter for pregnancy test, result negative: Secondary | ICD-10-CM

## 2016-03-12 DIAGNOSIS — Z308 Encounter for other contraceptive management: Secondary | ICD-10-CM

## 2016-03-12 DIAGNOSIS — Z3042 Encounter for surveillance of injectable contraceptive: Secondary | ICD-10-CM | POA: Diagnosis not present

## 2016-03-12 LAB — POCT URINE PREGNANCY: PREG TEST UR: NEGATIVE

## 2016-03-12 MED ORDER — MEDROXYPROGESTERONE ACETATE 150 MG/ML IM SUSP
150.0000 mg | Freq: Once | INTRAMUSCULAR | Status: AC
  Administered 2016-03-12: 150 mg via INTRAMUSCULAR

## 2016-03-12 NOTE — Progress Notes (Signed)
Pt here for Depo. Pt tolerated shot well. Return in 12 weeks for next shot. JSY 

## 2016-04-17 ENCOUNTER — Ambulatory Visit: Payer: Medicaid Other | Admitting: Advanced Practice Midwife

## 2016-05-01 ENCOUNTER — Ambulatory Visit: Payer: Medicaid Other | Admitting: Advanced Practice Midwife

## 2016-05-02 ENCOUNTER — Telehealth: Payer: Self-pay | Admitting: Obstetrics & Gynecology

## 2016-05-02 NOTE — Telephone Encounter (Signed)
Pt called stating that since she got her depo she has been having a brown discharge and her period stays on longer. Pt states that she has a Nexplanon in the back closet. Spoke with patient and let her know that she will be receiving a phone call from a nurse to help her. Please contact pt

## 2016-05-02 NOTE — Telephone Encounter (Signed)
Patient called stating she has been having dark brown discharge for about a month with the depo injection. She also has difficulty walking after the injection with bone pain. She would like to switch to Nexplanon. Call transferred to Angie to schedule.

## 2016-05-09 ENCOUNTER — Ambulatory Visit (INDEPENDENT_AMBULATORY_CARE_PROVIDER_SITE_OTHER): Payer: Medicaid Other

## 2016-05-09 ENCOUNTER — Encounter: Payer: Self-pay | Admitting: Orthopaedic Surgery

## 2016-05-09 ENCOUNTER — Ambulatory Visit (INDEPENDENT_AMBULATORY_CARE_PROVIDER_SITE_OTHER): Payer: Medicaid Other | Admitting: Orthopaedic Surgery

## 2016-05-09 VITALS — BP 134/83 | HR 81 | Ht 65.0 in | Wt 232.0 lb

## 2016-05-09 DIAGNOSIS — M67331 Transient synovitis, right wrist: Secondary | ICD-10-CM

## 2016-05-09 DIAGNOSIS — M25531 Pain in right wrist: Secondary | ICD-10-CM | POA: Diagnosis not present

## 2016-05-09 DIAGNOSIS — G5601 Carpal tunnel syndrome, right upper limb: Secondary | ICD-10-CM

## 2016-05-09 MED ORDER — PREDNISONE 5 MG (21) PO TBPK: ORAL_TABLET | ORAL | 0 refills | Status: DC

## 2016-05-09 NOTE — Progress Notes (Signed)
Patient Breanna Malone [name], female DOB:1980-03-25, 36 y.o. [id number]  Chief Complaint  Patient presents with  . Hand Pain    BILATERAL, RT WORSE THAN LT    HPI  Breanna Malone is a 36 y.o. female who has developed pain in both hand and nocturnal numbness, more on the right. She has right hand swelling.  She has pain up to the shoulder at times.  It gets numb in the right hand median nerve distribution more at night but sometimes during the day.  She has no trauma, no puncture wounds.  She has had no redness. She has tried ice, heat, Tylenol with no help.  She saw Dr. Legrand Rams recently. HPI  Body mass index is 38.61 kg/m.  ROS  Review of Systems  HENT: Negative for congestion.   Respiratory: Positive for shortness of breath. Negative for cough.   Cardiovascular: Negative for chest pain and leg swelling.  Endocrine: Positive for cold intolerance.  Musculoskeletal: Positive for arthralgias and joint swelling.  Allergic/Immunologic: Positive for environmental allergies.    Past Medical History:  Diagnosis Date  . Abnormal Pap smear   . Asthma   . Bipolar 1 disorder ([place])   . Bronchitis   . Carpal tunnel syndrome   . Chest pain   . Elevated cholesterol   . High cholesterol   . Hyperlipidemia   . Hyperlipoproteinemia   . Post traumatic stress disorder   . Right ovarian cyst 10/31/2012   Complex right ovarian cyst seen 8/18 in ER need f/u US    Past Surgical History:  Procedure Laterality Date  . HERNIA REPAIR      Family History  Problem Relation Age of Onset  . Diabetes Mother   . Hypertension Mother   . Asthma Father   . Bronchitis Daughter   . Asthma Daughter   . Stroke Paternal Grandmother   . Heart attack Paternal Grandmother   . Cancer Paternal Grandmother   . Asthma Son   . Bronchitis Son   . Asthma Daughter   . Bronchitis Daughter   . Asthma Son   . Asthma Other   . Bronchitis Other   . Cancer Cousin     Social History Social History  Substance  Use Topics  . Smoking status: Never Smoker  . Smokeless tobacco: Never Used  . Alcohol use No    Allergies  Allergen Reactions  . Darvocet [Propoxyphene N-Acetaminophen] Itching  . Hydrocodone-Acetaminophen Itching  . Lactose Intolerance (Gi) Other (See Comments)    Causes stomach cramping.  . Other Hives    Patient is allergic to corndogs.  . Penicillins Hives and Itching  . Latex Itching and Rash  . Lortab [Hydrocodone-Acetaminophen] Rash  . Percocet [Oxycodone-Acetaminophen] Itching and Rash    Current Outpatient Prescriptions  Medication Sig Dispense Refill  . albuterol (PROVENTIL HFA;VENTOLIN HFA) 108 (90 BASE) MCG/ACT inhaler Inhale 1-2 puffs into the lungs every 6 (six) hours as needed for wheezing or shortness of breath. 1 Inhaler 0  . MedroxyPROGESTERone Acetate 150 MG/ML SUSY INJECT 1 ML (150MG ) INTRAMUSCULARLY EVERY 3 MONTHS. 1 mL 3  . predniSONE (STERAPRED UNI-PAK 21 TAB) 5 MG (21) TBPK tablet Take 6 pills first day; 5 pills second day; 4 pills third day; 3 pills fourth day; 2 pills next day and 1 pill last day. 21 tablet 0  . Pseudoephedrine HCl (SUDAFED PO) Take by mouth as needed.     No current facility-administered medications for this visit.      Physical  Exam  Blood pressure 134/83, pulse 81, height 5\' 5"  (1.651 m), weight 232 lb (105.2 kg).  Constitutional: overall normal hygiene, normal nutrition, well developed, normal grooming, normal body habitus. Assistive device:small splint right wrist.  Musculoskeletal: gait and station Limp none, muscle tone and strength are normal, no tremors or atrophy is present.  .  Neurological: coordination overall normal.  Deep tendon reflex/nerve stretch intact.  Sensation normal.  Cranial nerves II-XII intact.   Skin:   Normal overall no scars, lesions, ulcers or rashes. No psoriasis.  Psychiatric: Alert and oriented x 3.  Recent memory intact, remote memory unclear.  Normal mood and affect. Well groomed.  Good eye  contact.  Cardiovascular: overall no swelling, no varicosities, no edema bilaterally, normal temperatures of the legs and arms, no clubbing, cyanosis and good capillary refill.  Lymphatic: palpation is normal.  Both hands have bilateral positive Phalen and Tinel signs, more on the right.  Right hand has more pronounced decreased sensation of median nerve. The right hand has some diffuse swelling.  Grips are good bilaterally.  Neck has full motion.  Shoulders have full motion.  The patient has been educated about the nature of the problem(s) and counseled on treatment options.  The patient appeared to understand what I have discussed and is in agreement with it.  Encounter Diagnoses  Name Primary?  . Acute pain of right wrist Yes  . Carpal tunnel syndrome of right wrist   . Transient synovitis, right wrist     PLAN Call if any problems.  Precautions discussed.  Continue current medications.   Return to clinic after EMGs   I have given Rx for Prednisone dose pack.  Electronically Signed Sanjuana Kava, MD 3/21/201810:58 AM

## 2016-05-11 ENCOUNTER — Ambulatory Visit: Payer: Medicaid Other | Admitting: Adult Health

## 2016-05-14 ENCOUNTER — Ambulatory Visit: Payer: Medicaid Other | Admitting: Women's Health

## 2016-05-14 ENCOUNTER — Encounter: Payer: Self-pay | Admitting: Women's Health

## 2016-05-14 VITALS — BP 110/70 | HR 76 | Ht 64.0 in | Wt 230.6 lb

## 2016-05-14 DIAGNOSIS — N921 Excessive and frequent menstruation with irregular cycle: Secondary | ICD-10-CM

## 2016-05-14 DIAGNOSIS — N920 Excessive and frequent menstruation with regular cycle: Secondary | ICD-10-CM

## 2016-05-14 DIAGNOSIS — Z113 Encounter for screening for infections with a predominantly sexual mode of transmission: Secondary | ICD-10-CM | POA: Diagnosis not present

## 2016-05-14 LAB — POCT WET PREP (WET MOUNT)
CLUE CELLS WET PREP WHIFF POC: NEGATIVE
Trichomonas Wet Prep HPF POC: ABSENT

## 2016-05-14 MED ORDER — MEGESTROL ACETATE 40 MG PO TABS: ORAL_TABLET | ORAL | 1 refills | Status: DC

## 2016-05-14 NOTE — Patient Instructions (Signed)
Laparoscopic Tubal Ligation Laparoscopic tubal ligation is a procedure to close the fallopian tubes. This is done so that you cannot get pregnant. When the fallopian tubes are closed, the eggs that your ovaries release cannot enter the uterus, and sperm cannot reach the released eggs. A laparoscopic tubal ligation is sometimes called "getting your tubes tied." You should not have this procedure if you want to get pregnant someday or if you are unsure about having more children. Tell a health care provider about:  Any allergies you have.  All medicines you are taking, including vitamins, herbs, eye drops, creams, and over-the-counter medicines.  Any problems you or family members have had with anesthetic medicines.  Any blood disorders you have.  Any surgeries you have had.  Any medical conditions you have.  Whether you are pregnant or may be pregnant.  Any past pregnancies. What are the risks? Generally, this is a safe procedure. However, problems may occur, including:  Infection.  Bleeding.  Injury to surrounding organs.  Side effects from anesthetics.  Failure of the procedure. This procedure can increase your risk of a kind of pregnancy in which a fertilized egg attaches to the outside of the uterus (ectopic pregnancy). What happens before the procedure?  Ask your health care provider about:  Changing or stopping your regular medicines. This is especially important if you are taking diabetes medicines or blood thinners.  Taking medicines such as aspirin and ibuprofen. These medicines can thin your blood. Do not take these medicines before your procedure if your health care provider instructs you not to.  Follow instructions from your health care provider about eating and drinking restrictions.  Plan to have someone take you home after the procedure.  If you go home right after the procedure, plan to have someone with you for 24 hours. What happens during the  procedure?  You will be given one or more of the following:  A medicine to help you relax (sedative).  A medicine to numb the area (local anesthetic).  A medicine to make you fall asleep (general anesthetic).  A medicine that is injected into an area of your body to numb everything below the injection site (regional anesthetic).  An IV tube will be inserted into one of your veins. It will be used to give you medicines and fluids during the procedure.  Your bladder may be emptied with a small tube (catheter).  If you have been given a general anesthetic, a tube will be put down your throat to help you breathe.  Two small cuts (incisions) will be made in your lower abdomen and near your belly button.  Your abdomen will be inflated with a gas. This will let the surgeon see better and will give the surgeon room to work.  A thin, lighted tube (laparoscope) with a camera attached will be inserted into your abdomen through one of the incisions. Small instruments will be inserted through the other incision.  The fallopian tubes will be tied off, burned (cauterized), or blocked with a clip, ring, or clamp. A small portion in the center of each fallopian tube may be removed.  The gas will be released from the abdomen.  The incisions will be closed with stitches (sutures).  A bandage (dressing) will be placed over the incisions. The procedure may vary among health care providers and hospitals. What happens after the procedure?  Your blood pressure, heart rate, breathing rate, and blood oxygen level will be monitored often until the medicines you were  given have worn off.  You will be given medicine to help with pain, nausea, and vomiting as needed. This information is not intended to replace advice given to you by your health care provider. Make sure you discuss any questions you have with your health care provider. Document Released: 05/14/2000 Document Revised: 07/14/2015 Document  Reviewed: 01/16/2015 Elsevier Interactive Patient Education  2017 [employer].

## 2016-05-14 NOTE — Progress Notes (Signed)
   [facility] Visit  Patient name: Breanna Malone MRN [id number]  Date of birth: October 22, 1980  CC & HPI:  Breanna Malone is a 36 y.o. [id number] African American female presenting today for report of brown spotting and irregular periods last few months w/ depo. Denies itching/irritation/odor. Has been on depo for 2 1/2 yrs. Hasn't had this spotting/bleeding before. Not currently sexually active, husband passed away from [place] @ 36yo last year. Wants tubes tied in June. Has appt for nexplanon insertion on 4/10, states someone told her she could get it for a few months then have tubes tied. Discussed nexplanon likely to cause same/worse irregular bleeding, and if she wants tubes tied in June, nexplanon wouldn't make sense. Recommended getting 1 more dose of depo when due in April, then [place] in June. Understands, and wishes to proceed w/ this plan.  Discussed option of megace to help stop bleeding- wishes to do this.  No LMP recorded. Patient has had an injection. The current method of family planning is Depo-Provera injections, last Depo 1/22 Last pap 03/02/15 neg w/ -HRHPV  Pertinent History Reviewed:  Medical & Surgical Hx:   Past medical, surgical, family, and social history reviewed in electronic medical record Medications: Reviewed & Updated - see associated section Allergies: Reviewed in electronic medical record  Objective Findings:  Vitals: BP 110/70   Pulse 76   Ht 5\' 4"  (1.626 m)   Wt 230 lb 9.6 oz (104.6 kg)   BMI 39.58 kg/m  Body mass index is 39.58 kg/m.  Physical Examination: General appearance - alert, well appearing, and in no distress Pelvic - cx appears normal, small amt brown nonodorous d/c c/w old blood  Results for orders placed or performed in visit on 05/14/16 (from the past 24 hour(s))  POCT Wet Prep [facility] [place])   Collection Time: 05/14/16 12:12 PM  Result Value Ref Range   Source Wet Prep POC vaginal    WBC, Wet Prep HPF POC few    Bacteria Wet Prep HPF POC  None (A) Few   BACTERIA WET PREP MORPHOLOGY POC     Clue Cells Wet Prep HPF POC None None   Clue Cells Wet Prep Whiff POC Negative Whiff    Yeast Wet Prep HPF POC None    KOH Wet Prep POC     Trichomonas Wet Prep HPF POC Absent Absent     Assessment & Plan:  A:   BTB w/ depo  P:  Rx megac algorithm  gc/ct today  Return for 4/9 for depo (cancel 4/10 nexplanon insertion), then end of May for pre-op w/ MD for BTL.  Tawnya Crook CNM, [facility] 05/14/2016 12:13 PM

## 2016-05-16 LAB — GC/CHLAMYDIA PROBE AMP
Chlamydia trachomatis, NAA: NEGATIVE
Neisseria gonorrhoeae by PCR: NEGATIVE

## 2016-05-17 ENCOUNTER — Encounter: Payer: Self-pay | Admitting: Orthopaedic Surgery

## 2016-05-17 ENCOUNTER — Ambulatory Visit: Payer: Medicaid Other | Admitting: Women's Health

## 2016-05-23 ENCOUNTER — Encounter: Payer: Self-pay | Admitting: Orthopaedic Surgery

## 2016-05-23 ENCOUNTER — Ambulatory Visit (INDEPENDENT_AMBULATORY_CARE_PROVIDER_SITE_OTHER): Payer: Medicaid Other | Admitting: Orthopaedic Surgery

## 2016-05-23 VITALS — BP 132/88 | HR 91 | Temp 97.7°F | Ht 64.0 in | Wt 232.0 lb

## 2016-05-23 DIAGNOSIS — G5601 Carpal tunnel syndrome, right upper limb: Secondary | ICD-10-CM | POA: Diagnosis not present

## 2016-05-23 DIAGNOSIS — G5602 Carpal tunnel syndrome, left upper limb: Secondary | ICD-10-CM | POA: Diagnosis not present

## 2016-05-23 NOTE — Progress Notes (Signed)
Patient JE:HUDJSH Breanna Malone, female DOB:[birth year], 36 y.o. FWY:[id number]  Chief Complaint  Patient presents with  . Follow-up    right wrist/hand pain, EMG review    HPI  Breanna Malone is a 36 y.o. female who has had carpal tunnel symptoms more on the right.  She had EMG's done which show bilateral carpal tunnel syndrome.  I have talked to her about surgery.  She would want the right hand done first.  I will have her see Dr. Aline Brochure for evaluation. HPI  Body mass index is 39.82 kg/m.  ROS  Review of Systems  HENT: Negative for congestion.   Respiratory: Positive for shortness of breath. Negative for cough.   Cardiovascular: Negative for chest pain and leg swelling.  Endocrine: Positive for cold intolerance.  Musculoskeletal: Positive for arthralgias and joint swelling.  Allergic/Immunologic: Positive for environmental allergies.    Past Medical History:  Diagnosis Date  . Abnormal Pap smear   . Asthma   . Bipolar 1 disorder ([place])   . Bronchitis   . Carpal tunnel syndrome   . Chest pain   . Elevated cholesterol   . High cholesterol   . Hyperlipidemia   . Hyperlipoproteinemia   . Post traumatic stress disorder   . Right ovarian cyst 10/31/2012   Complex right ovarian cyst seen 8/18 in ER need f/u US    Past Surgical History:  Procedure Laterality Date  . HERNIA REPAIR      Family History  Problem Relation Age of Onset  . Diabetes Mother   . Hypertension Mother   . Asthma Father   . Bronchitis Daughter   . Asthma Daughter   . Stroke Paternal Grandmother   . Heart attack Paternal Grandmother   . Cancer Paternal Grandmother   . Asthma Son   . Bronchitis Son   . Asthma Daughter   . Bronchitis Daughter   . Asthma Son   . Asthma Other   . Bronchitis Other   . Cancer Cousin     Social History Social History  Substance Use Topics  . Smoking status: Never Smoker  . Smokeless tobacco: Never Used  . Alcohol use No    Allergies  Allergen Reactions  .  Darvocet [Propoxyphene N-Acetaminophen] Itching  . Hydrocodone-Acetaminophen Itching  . Lactose Intolerance (Gi) Other (See Comments)    Causes stomach cramping.  . Other Hives    Patient is allergic to corndogs.  . Penicillins Hives and Itching  . Latex Itching and Rash  . Lortab [Hydrocodone-Acetaminophen] Rash  . Percocet [Oxycodone-Acetaminophen] Itching and Rash    Current Outpatient Prescriptions  Medication Sig Dispense Refill  . albuterol (PROVENTIL HFA;VENTOLIN HFA) 108 (90 BASE) MCG/ACT inhaler Inhale 1-2 puffs into the lungs every 6 (six) hours as needed for wheezing or shortness of breath. (Patient not taking: Reported on 05/14/2016) 1 Inhaler 0  . MedroxyPROGESTERone Acetate 150 MG/ML SUSY INJECT 1 ML (150MG ) INTRAMUSCULARLY EVERY 3 MONTHS. 1 mL 3  . megestrol (MEGACE) 40 MG tablet 3x5d, 2x5d, then 1 daily. Stop taking 1 week after bleeding stops. 45 tablet 1  . predniSONE (STERAPRED UNI-PAK 21 TAB) 5 MG (21) TBPK tablet Take 6 pills first day; 5 pills second day; 4 pills third day; 3 pills fourth day; 2 pills next day and 1 pill last day. 21 tablet 0  . Pseudoephedrine HCl (SUDAFED PO) Take by mouth as needed.     No current facility-administered medications for this visit.      Physical Exam  Blood pressure 132/88, pulse 91, temperature 97.7 F (36.5 C), height 5\' 4"  (1.626 m), weight 232 lb (105.2 kg).  Constitutional: overall normal hygiene, normal nutrition, well developed, normal grooming, normal body habitus. Assistive device:cock-up splint right  Musculoskeletal: gait and station Limp none, muscle tone and strength are normal, no tremors or atrophy is present.  .  Neurological: coordination overall normal.  Deep tendon reflex/nerve stretch intact.  Sensation normal.  Cranial nerves II-XII intact.   Skin:   Normal overall no scars, lesions, ulcers or rashes. No psoriasis.  Psychiatric: Alert and oriented x 3.  Recent memory intact, remote memory unclear.   Normal mood and affect. Well groomed.  Good eye contact.  Cardiovascular: overall no swelling, no varicosities, no edema bilaterally, normal temperatures of the legs and arms, no clubbing, cyanosis and good capillary refill.  Lymphatic: palpation is normal.  She has bilateral Phalen and Tinel of the wrist, grips are normal.  Decreased sensation of median nerve bilaterally is present.  The patient has been educated about the nature of the problem(s) and counseled on treatment options.  The patient appeared to understand what I have discussed and is in agreement with it.  Encounter Diagnoses  Name Primary?  . Carpal tunnel syndrome of right wrist Yes  . Carpal tunnel syndrome of left wrist     PLAN Call if any problems.  Precautions discussed.  Continue current medications.   Return to clinic tro see Dr. Aline Brochure for consideration of possible surgery.   Electronically Signed Sanjuana Kava, MD 4/4/20189:31 AM

## 2016-05-28 ENCOUNTER — Ambulatory Visit (INDEPENDENT_AMBULATORY_CARE_PROVIDER_SITE_OTHER): Payer: Medicaid Other

## 2016-05-28 DIAGNOSIS — Z3042 Encounter for surveillance of injectable contraceptive: Secondary | ICD-10-CM

## 2016-05-28 DIAGNOSIS — Z3202 Encounter for pregnancy test, result negative: Secondary | ICD-10-CM | POA: Diagnosis not present

## 2016-05-28 LAB — POCT URINE PREGNANCY: Preg Test, Ur: NEGATIVE

## 2016-05-28 MED ORDER — MEDROXYPROGESTERONE ACETATE 150 MG/ML IM SUSP
150.0000 mg | Freq: Once | INTRAMUSCULAR | Status: AC
  Administered 2016-05-28: 150 mg via INTRAMUSCULAR

## 2016-05-28 NOTE — Progress Notes (Signed)
PT here for Depo Shot 150 mg IM given LT Deltoid. Tolerated well. Return 12 week for next shot.Unable to given RT Deltoid, arm swollen and painful. Pad CMA

## 2016-05-29 ENCOUNTER — Encounter: Payer: Medicaid Other | Admitting: Advanced Practice Midwife

## 2016-05-30 ENCOUNTER — Encounter (HOSPITAL_COMMUNITY): Payer: Self-pay | Admitting: *Deleted

## 2016-05-30 ENCOUNTER — Emergency Department (HOSPITAL_COMMUNITY)
Admission: EM | Admit: 2016-05-30 | Discharge: 2016-05-30 | Disposition: A | Discharge status: Home / Self Care | Payer: Medicaid Other | Attending: Emergency Medicine | Admitting: Emergency Medicine

## 2016-05-30 DIAGNOSIS — Z79899 Other long term (current) drug therapy: Secondary | ICD-10-CM | POA: Diagnosis not present

## 2016-05-30 DIAGNOSIS — J45909 Unspecified asthma, uncomplicated: Secondary | ICD-10-CM | POA: Diagnosis not present

## 2016-05-30 DIAGNOSIS — G5601 Carpal tunnel syndrome, right upper limb: Secondary | ICD-10-CM | POA: Insufficient documentation

## 2016-05-30 DIAGNOSIS — M25531 Pain in right wrist: Secondary | ICD-10-CM | POA: Diagnosis present

## 2016-05-30 MED ORDER — DICLOFENAC SODIUM 75 MG PO TBEC
75.0000 mg | DELAYED_RELEASE_TABLET | Freq: Two times a day (BID) | ORAL | 0 refills | Status: DC
Start: 2016-05-30 — End: 2016-06-25

## 2016-05-30 NOTE — ED Triage Notes (Signed)
Pt c/o right wrist pain for a while; pt states her hand has a "pins and needles" feeling to the wrist with sharp pains in her fingers; pt has been told she has arthritis and carpel tunel in that hand; pt is wearing a splint

## 2016-05-30 NOTE — Discharge Instructions (Signed)
Elevate your hand when possible and continue to wear to the brace day and night.  You can apply ice packs on/off to your wrist to help with swelling.  Call Dr. Brooke Bonito office for follow-up

## 2016-06-01 NOTE — ED Provider Notes (Signed)
[facility] DEPT Provider Note   CSN: [id number] Arrival date & time: 05/30/16  1855     History   Chief Complaint Chief Complaint  Patient presents with  . Wrist Pain    HPI Breanna Malone is a 36 y.o. female.  HPI  Breanna Malone is a 36 y.o. female who presents to the Emergency Department complaining of persistent wrist pain.  She states she has seen orthopedics nearly one month ago and treated for carpal tunnel of both wrists with symptoms being worse in the right wrist. She describes a "pins and needles" sensation of her wrist that radiates into her arm.  Symptoms worse with gripping and use of the hand. She has been wearing a cock up splint which she states is not helping.  She comes to ER due to pain.  She has an upcoming appt with Dr. Aline Brochure to discuss possible surgery.  She denies new symptoms, neck pain, skin changes   Past Medical History:  Diagnosis Date  . Abnormal Pap smear   . Asthma   . Bipolar 1 disorder ([place])   . Bronchitis   . Carpal tunnel syndrome   . Chest pain   . Elevated cholesterol   . High cholesterol   . Hyperlipidemia   . Hyperlipoproteinemia   . Post traumatic stress disorder   . Right ovarian cyst 10/31/2012   Complex right ovarian cyst seen 8/18 in ER need f/u US    Patient Active Problem List   Diagnosis Date Noted  . Condyloma acuminata 09/08/2014  . Condyloma acuminatum in female 02/24/2014  . URI (upper respiratory infection) 02/18/2013  . Abnormal Pap smear of cervix 11/24/2012  . Asthma 11/24/2012  . Right ovarian cyst 10/31/2012  . Bipolar 1 disorder ([place]) 10/31/2012    Past Surgical History:  Procedure Laterality Date  . HERNIA REPAIR      OB History    Gravida Para Term Preterm AB Living   7 6 6   1 4    SAB TAB Ectopic Multiple Live Births   1       6       Home Medications    Prior to Admission medications   Medication Sig Start Date End Date Taking? Authorizing Provider  albuterol (PROVENTIL  HFA;VENTOLIN HFA) 108 (90 BASE) MCG/ACT inhaler Inhale 1-2 puffs into the lungs every 6 (six) hours as needed for wheezing or shortness of breath. Patient not taking: Reported on 05/14/2016 04/30/14   Ashley Murrain, NP  diclofenac (VOLTAREN) 75 MG EC tablet Take 1 tablet (75 mg total) by mouth 2 (two) times daily. Take with food 05/30/16   Dearra Myhand, PA-C  MedroxyPROGESTERone Acetate 150 MG/ML SUSY INJECT 1 ML (150MG ) INTRAMUSCULARLY EVERY 3 MONTHS. 12/15/15   Florian Buff, MD  megestrol (MEGACE) 40 MG tablet 3x5d, 2x5d, then 1 daily. Stop taking 1 week after bleeding stops. 05/14/16   Roma Schanz, CNM  predniSONE (STERAPRED UNI-PAK 21 TAB) 5 MG (21) TBPK tablet Take 6 pills first day; 5 pills second day; 4 pills third day; 3 pills fourth day; 2 pills next day and 1 pill last day. Patient not taking: Reported on 05/28/2016 05/09/16   Sanjuana Kava, MD  Pseudoephedrine HCl (SUDAFED PO) Take by mouth as needed.    Historical Provider, MD    Family History Family History  Problem Relation Age of Onset  . Diabetes Mother   . Hypertension Mother   . Asthma Father   . Bronchitis Daughter   .  Asthma Daughter   . Stroke Paternal Grandmother   . Heart attack Paternal Grandmother   . Cancer Paternal Grandmother   . Asthma Son   . Bronchitis Son   . Asthma Daughter   . Bronchitis Daughter   . Asthma Son   . Asthma Other   . Bronchitis Other   . Cancer Cousin     Social History Social History  Substance Use Topics  . Smoking status: Never Smoker  . Smokeless tobacco: Never Used  . Alcohol use No     Allergies   Darvocet [propoxyphene n-acetaminophen]; Hydrocodone-acetaminophen; Lactose intolerance (gi); Other; Penicillins; Latex; Lortab [hydrocodone-acetaminophen]; and Percocet [oxycodone-acetaminophen]   Review of Systems Review of Systems  Constitutional: Negative for chills and fever.  Musculoskeletal: Positive for arthralgias (right wrist pain) and neck pain. Negative for  joint swelling.  Skin: Negative for color change and wound.  Neurological: Positive for numbness (numbness and tingling of fingers of right hand ). Negative for dizziness, weakness and headaches.  All other systems reviewed and are negative.    Physical Exam Updated Vital Signs BP (!) 147/89 (BP Location: Right Arm)   Pulse 80   Temp 98.5 F (36.9 C) (Oral)   Resp 16   Ht 5\' 4"  (1.626 m)   Wt 105.2 kg   SpO2 97%   BMI 39.82 kg/m   Physical Exam  Constitutional: She is oriented to person, place, and time. She appears well-developed and well-nourished. No distress.  HENT:  Head: Normocephalic and atraumatic.  Neck: Normal range of motion.  Cardiovascular: Normal rate, regular rhythm and intact distal pulses.   Pulmonary/Chest: Effort normal and breath sounds normal.  Musculoskeletal: She exhibits tenderness. She exhibits no edema.  ttp of distal right wrist.  Grip strength slightly diminished compared to left.  No edema or bony deformity.  Patient has full ROM. Compartments soft.  Neurological: She is alert and oriented to person, place, and time. No sensory deficit. She exhibits normal muscle tone. Coordination normal.  Skin: Skin is warm and dry. Capillary refill takes less than 2 seconds.  Nursing note and vitals reviewed.    ED Treatments / Results  Labs (all labs ordered are listed, but only abnormal results are displayed) Labs Reviewed - No data to display  EKG  EKG Interpretation None       Radiology No results found.  Procedures Procedures (including critical care time)  Medications Ordered in ED Medications - No data to display   Initial Impression / Assessment and Plan / ED Course  I have reviewed the triage vital signs and the nursing notes.  Pertinent labs & imaging results that were available during my care of the patient were reviewed by me and considered in my medical decision making (see chart for details).     Pt with previously  documented carpal tunnel.  Currently followed by orthopedics. No focal neuro deficits. Advised her to continue wearing splint, pain addressed with NSAID  Final Clinical Impressions(s) / ED Diagnoses   Final diagnoses:  Carpal tunnel syndrome of right wrist    New Prescriptions Discharge Medication List as of 05/30/2016  8:05 PM    START taking these medications   Details  diclofenac (VOLTAREN) 75 MG EC tablet Take 1 tablet (75 mg total) by mouth 2 (two) times daily. Take with food, Starting Wed 05/30/2016, [place], [place] 06/01/16 [place], MD 06/04/16 [phone]

## 2016-06-04 ENCOUNTER — Ambulatory Visit: Payer: Medicaid Other

## 2016-06-06 ENCOUNTER — Encounter: Payer: Self-pay | Admitting: Orthopedic Surgery

## 2016-06-06 ENCOUNTER — Ambulatory Visit (INDEPENDENT_AMBULATORY_CARE_PROVIDER_SITE_OTHER): Payer: Medicaid Other | Admitting: Orthopedic Surgery

## 2016-06-06 VITALS — BP 132/88 | HR 83 | Ht 65.0 in

## 2016-06-06 DIAGNOSIS — G5602 Carpal tunnel syndrome, left upper limb: Secondary | ICD-10-CM

## 2016-06-06 DIAGNOSIS — G5601 Carpal tunnel syndrome, right upper limb: Secondary | ICD-10-CM

## 2016-06-06 NOTE — Progress Notes (Signed)
HISTORY AND PHYSICAL   Chief Complaint  Patient presents with  . Carpal Tunnel    Right Surgery consult   HPI 36 yo female with bilateral wrist pain and numbness and tingling of the right and left hand for several months ; she had ncs/emg which show bilateral carpal tunnel syndrome. She c/o weakness and poor grip strength and pain  Review of Systems  Neurological: Positive for tingling.  All other systems reviewed and are negative.    Past Medical History:  Diagnosis Date  . Abnormal Pap smear   . Asthma   . Bipolar 1 disorder ([place])   . Bronchitis   . Carpal tunnel syndrome   . Chest pain   . Elevated cholesterol   . High cholesterol   . Hyperlipidemia   . Hyperlipoproteinemia   . Post traumatic stress disorder   . Right ovarian cyst 10/31/2012   Complex right ovarian cyst seen 8/18 in ER need f/u US    Past Surgical History:  Procedure Laterality Date  . HERNIA REPAIR     Family History  Problem Relation Age of Onset  . Diabetes Mother   . Hypertension Mother   . Asthma Father   . Bronchitis Daughter   . Asthma Daughter   . Stroke Paternal Grandmother   . Heart attack Paternal Grandmother   . Cancer Paternal Grandmother   . Asthma Son   . Bronchitis Son   . Asthma Daughter   . Bronchitis Daughter   . Asthma Son   . Asthma Other   . Bronchitis Other   . Cancer Cousin    Social History  Substance Use Topics  . Smoking status: Never Smoker  . Smokeless tobacco: Never Used  . Alcohol use No   No outpatient prescriptions have been marked as taking for the 06/06/16 encounter (Office Visit) with Carole Civil, MD.    BP 132/88   Pulse 83   Ht 5\' 5"  (1.651 m)   Physical Exam  Constitutional: She is oriented to person, place, and time. She appears well-developed and well-nourished. No distress.  HENT:  Head: Normocephalic and atraumatic.  Right Ear: External ear normal.  Left Ear: External ear normal.  Mouth/Throat: Oropharynx is clear and moist.  No oropharyngeal exudate.  Eyes: Conjunctivae and EOM are normal. Pupils are equal, round, and reactive to light. Right eye exhibits no discharge. Left eye exhibits no discharge. No scleral icterus.  Neck: Neck supple. No JVD present. No tracheal deviation present. No thyromegaly present.  Cardiovascular: Normal rate, regular rhythm and intact distal pulses.   Pulmonary/Chest: Effort normal. No stridor.  Abdominal: She exhibits no distension.  Musculoskeletal:       Right shoulder: She exhibits normal range of motion, no tenderness, no bony tenderness, no swelling, no effusion, no crepitus, no deformity, no laceration, no pain, no spasm, normal pulse and normal strength.       Left shoulder: She exhibits normal range of motion, no tenderness, no bony tenderness, no swelling, no effusion, no crepitus, no deformity, no laceration, no pain, no spasm, normal pulse and normal strength.       Right wrist: She exhibits tenderness. She exhibits normal range of motion, no bony tenderness, no swelling, no effusion, no crepitus, no deformity and no laceration.       Left wrist: She exhibits tenderness. She exhibits normal range of motion, no bony tenderness, no swelling, no effusion, no crepitus, no deformity and no laceration.  Lymphadenopathy:    She has  no cervical adenopathy.       Right: No supraclavicular and no epitrochlear adenopathy present.       Left: Epitrochlear adenopathy present. No supraclavicular adenopathy present.  Neurological: She is alert and oriented to person, place, and time. She has normal reflexes. She displays no atrophy and normal reflexes. A sensory deficit is present. No cranial nerve deficit. She exhibits normal muscle tone. She displays no seizure activity. Coordination and gait normal. GCS eye subscore is 4. GCS verbal subscore is 5. GCS motor subscore is 6.  Medial nerve right and left hand decrease in sensation   Skin: Skin is warm and dry. Capillary refill takes less than 2  seconds. No rash noted. She is not diaphoretic. No cyanosis or erythema. No pallor. Nails show no clubbing.  Psychiatric: She has a normal mood and affect. Her behavior is normal. Judgment and thought content normal.    Right Hand Exam   Tenderness  The patient is experiencing tenderness in the palmer area.  Range of Motion  The patient has normal right wrist ROM.   Muscle Strength  Wrist Extension: 5/5  Wrist Flexion: 5/5  Grip: 4/5   Tests  Phalen's Sign: positive Tinel's Sign (Medial Nerve): positive Finkelstein: negative  Other  Sensation: decreased Pulse: present   Left Hand Exam   Tenderness  The patient is experiencing tenderness in the palmer area.   Range of Motion  The patient has normal left wrist ROM.  Muscle Strength  Wrist Extension: 5/5  Wrist Flexion: 5/5  Grip:  4/5   Tests  Phalen's Sign: positive Tinel's Sign (Medial Nerve): positive Finkelstein: negative  Other  Sensation: decreased Pulse: present   Right Elbow Exam   Tenderness  The patient is experiencing no tenderness.     Range of Motion  The patient has normal right elbow ROM.  Muscle Strength  The patient has normal right elbow strength.  Tests Varus: negative Valgus: negative Tinel's Sign (cubital tunnel): negative   Left Elbow Exam   Tenderness  The patient is experiencing no tenderness.     Range of Motion  The patient has normal left elbow ROM.  Muscle Strength  The patient has normal left elbow strength.  Tests Varus: negative Valgus: negative Tinel's Sign (cubital tunnel): positive       ASSESSMENT: ncs emg  Bilateral carpal tunnel syndrome    PLAN Right carpal tunnel release  This procedure has been fully reviewed with the patient and written informed consent has been obtained.    Arther Abbott, MD 3:42 PM 06/06/2016

## 2016-06-22 NOTE — Patient Instructions (Signed)
Breanna Malone  06/22/2016     @PREFPERIOPPHARMACY @   Your procedure is scheduled on  06/28/2016   Report to [facility] at  840  A.M.  Call this number if you have problems the morning of surgery:  [phone]   Remember:  Do not eat food or drink liquids after midnight.  Take these medicines the morning of surgery with A SIP OF WATER  None. Take your inhaler before you come and bring your rescue inhaler with you.   Do not wear jewelry, make-up or nail polish.  Do not wear lotions, powders, or perfumes, or deoderant.  Do not shave 48 hours prior to surgery.  Men may shave face and neck.  Do not bring valuables to the hospital.  [facility] is not responsible for any belongings or valuables.  Contacts, dentures or bridgework may not be worn into surgery.  Leave your suitcase in the car.  After surgery it may be brought to your room.  For patients admitted to the hospital, discharge time will be determined by your treatment team.  Patients discharged the day of surgery will not be allowed to drive home.   Name and phone number of your driver:   family Special instructions:  None  Please read over the following fact sheets that you were given. Anesthesia Post-op Instructions and Care and Recovery After Surgery       Carpal Tunnel Release Carpal tunnel release is a surgical procedure to relieve numbness and pain in your hand that are caused by carpal tunnel syndrome. Your carpal tunnel is a narrow, hollow space in your wrist. It passes between your wrist bones and a band of connective tissue (transverse carpal ligament). The nerve that supplies most of your hand (median nerve) passes through this space, and so do the connections between your fingers and the muscles of your arm (tendons). Carpal tunnel syndrome makes this space swell and become narrow, and this causes pain and numbness. In carpal tunnel release surgery, a surgeon cuts through the transverse  carpal ligament to make more room in the carpal tunnel space. You may have this surgery if other types of treatment have not worked. Tell a health care provider about:  Any allergies you have.  All medicines you are taking, including vitamins, herbs, eye drops, creams, and over-the-counter medicines.  Any problems you or family members have had with anesthetic medicines.  Any blood disorders you have.  Any surgeries you have had.  Any medical conditions you have. What are the risks? Generally, this is a safe procedure. However, problems may occur, including:  Bleeding.  Infection.  Injury to the median nerve.  Need for additional surgery. What happens before the procedure?  Ask your health care provider about:  Changing or stopping your regular medicines. This is especially important if you are taking diabetes medicines or blood thinners.  Taking medicines such as aspirin and ibuprofen. These medicines can thin your blood. Do not take these medicines before your procedure if your health care provider instructs you not to.  Do not eat or drink anything after midnight on the night before the procedure or as directed by your health care provider.  Plan to have someone take you home after the procedure. What happens during the procedure?  An IV tube may be inserted into a vein.  You will be given one of the following:  A medicine that numbs the wrist area (  local anesthetic). You may also be given a medicine to make you relax (sedative).  A medicine that makes you go to sleep (general anesthetic).  Your arm, hand, and wrist will be cleaned with a germ-killing solution (antiseptic).  Your surgeon will make a surgical cut (incision) over the palm side of your wrist. The surgeon will pull aside the skin of your wrist to expose the carpal tunnel space.  The surgeon will cut the transverse carpal ligament.  The edges of the incision will be closed with stitches (sutures) or  staples.  A bandage (dressing) will be placed over your wrist and wrapped around your hand and wrist. What happens after the procedure?  You may spend some time in a recovery area.  Your blood pressure, heart rate, breathing rate, and blood oxygen level will be monitored often until the medicines you were given have worn off.  You will likely have some pain. You will be given pain medicine.  You may need to wear a splint or a wrist brace over your dressing. This information is not intended to replace advice given to you by your health care provider. Make sure you discuss any questions you have with your health care provider. Document Released: 04/28/2003 Document Revised: 07/14/2015 Document Reviewed: 09/23/2013 Elsevier Interactive Patient Education  2017 [place] After Refer to this sheet in the next few weeks. These instructions provide you with information about caring for yourself after your procedure. Your health care provider may also give you more specific instructions. Your treatment has been planned according to current medical practices, but problems sometimes occur. Call your health care provider if you have any problems or questions after your procedure. What can I expect after the procedure? After your procedure, it is typical to have the following:  Pain.  Numbness.  Tingling.  Swelling.  Stiffness.  Bruising. Follow these instructions at home:  Take medicines only as directed by your health care provider.  There are many different ways to close and cover an incision, including stitches (sutures), skin glue, and adhesive strips. Follow your health care provider's instructions about:  Incision care.  Bandage (dressing) changes and removal.  Incision closure removal.  Wear a splint or a brace as directed by your surgeon. You may need to do this for 2-3 weeks.  Keep your hand raised (elevated) above the level of your heart  while you are resting. Move your fingers often.  Avoid activities that cause hand pain.  Ask your surgeon when you can start to do all of your usual activities again, such as:  Driving.  Returning to work.  Bathing and swimming.  Keep all follow-up visits as directed by your health care provider. This is important. You may need physical therapy for several months to speed healing and regain movement. Contact a health care provider if:  You have drainage, redness, swelling, or pain at your incision site.  You have a fever.  You have chills.  Your pain medicine is not working.  Your symptoms do not go away after 2 months.  Your symptoms go away and then return. Get help right away if:  You have pain or numbness that is getting worse.  Your fingers change color.  You are not able to move your fingers. This information is not intended to replace advice given to you by your health care provider. Make sure you discuss any questions you have with your health care provider. Document Released: 08/25/2004 Document Revised: 07/14/2015  Document Reviewed: 09/23/2013 Elsevier Interactive Patient Education  2017 [place] Anesthesia is a term that refers to techniques, procedures, and medicines that help a person stay safe and comfortable during a medical procedure. Monitored anesthesia care, or sedation, is one type of anesthesia. Your anesthesia specialist may recommend sedation if you will be having a procedure that does not require you to be unconscious, such as:  Cataract surgery.  A dental procedure.  A biopsy.  A colonoscopy. During the procedure, you may receive a medicine to help you relax (sedative). There are three levels of sedation:  Mild sedation. At this level, you may feel awake and relaxed. You will be able to follow directions.  Moderate sedation. At this level, you will be sleepy. You may not remember the procedure.  Deep  sedation. At this level, you will be asleep. You will not remember the procedure. The more medicine you are given, the deeper your level of sedation will be. Depending on how you respond to the procedure, the anesthesia specialist may change your level of sedation or the type of anesthesia to fit your needs. An anesthesia specialist will monitor you closely during the procedure. Let your health care provider know about:  Any allergies you have.  All medicines you are taking, including vitamins, herbs, eye drops, creams, and over-the-counter medicines.  Any use of steroids (by mouth or as a cream).  Any problems you or family members have had with sedatives and anesthetic medicines.  Any blood disorders you have.  Any surgeries you have had.  Any medical conditions you have, such as sleep apnea.  Whether you are pregnant or may be pregnant.  Any use of cigarettes, alcohol, or street drugs. What are the risks? Generally, this is a safe procedure. However, problems may occur, including:  Getting too much medicine (oversedation).  Nausea.  Allergic reaction to medicines.  Trouble breathing. If this happens, a breathing tube may be used to help with breathing. It will be removed when you are awake and breathing on your own.  Heart trouble.  Lung trouble. Before the procedure Staying hydrated  Follow instructions from your health care provider about hydration, which may include:  Up to 2 hours before the procedure - you may continue to drink clear liquids, such as water, clear fruit juice, black coffee, and plain tea. Eating and drinking restrictions  Follow instructions from your health care provider about eating and drinking, which may include:  8 hours before the procedure - stop eating heavy meals or foods such as meat, fried foods, or fatty foods.  6 hours before the procedure - stop eating light meals or foods, such as toast or cereal.  6 hours before the procedure -  stop drinking milk or drinks that contain milk.  2 hours before the procedure - stop drinking clear liquids. Medicines  Ask your health care provider about:  Changing or stopping your regular medicines. This is especially important if you are taking diabetes medicines or blood thinners.  Taking medicines such as aspirin and ibuprofen. These medicines can thin your blood. Do not take these medicines before your procedure if your health care provider instructs you not to. Tests and exams  You will have a physical exam.  You may have blood tests done to show:  How well your kidneys and liver are working.  How well your blood can clot.  General instructions  Plan to have someone take you home from the hospital or clinic.  If you will be going home right after the procedure, plan to have someone with you for 24 hours. What happens during the procedure?  Your blood pressure, heart rate, breathing, level of pain and overall condition will be monitored.  An IV tube will be inserted into one of your veins.  Your anesthesia specialist will give you medicines as needed to keep you comfortable during the procedure. This may mean changing the level of sedation.  The procedure will be performed. After the procedure  Your blood pressure, heart rate, breathing rate, and blood oxygen level will be monitored until the medicines you were given have worn off.  Do not drive for 24 hours if you received a sedative.  You may:  Feel sleepy, clumsy, or nauseous.  Feel forgetful about what happened after the procedure.  Have a sore throat if you had a breathing tube during the procedure.  Vomit. This information is not intended to replace advice given to you by your health care provider. Make sure you discuss any questions you have with your health care provider. Document Released: 11/01/2004 Document Revised: 07/15/2015 Document Reviewed: 05/29/2015 Elsevier Interactive Patient Education   2017 [place], Care After These instructions provide you with information about caring for yourself after your procedure. Your health care provider may also give you more specific instructions. Your treatment has been planned according to current medical practices, but problems sometimes occur. Call your health care provider if you have any problems or questions after your procedure. What can I expect after the procedure? After your procedure, it is common to:  Feel sleepy for several hours.  Feel clumsy and have poor balance for several hours.  Feel forgetful about what happened after the procedure.  Have poor judgment for several hours.  Feel nauseous or vomit.  Have a sore throat if you had a breathing tube during the procedure. Follow these instructions at home: For at least 24 hours after the procedure:    Do not:  Participate in activities in which you could fall or become injured.  Drive.  Use heavy machinery.  Drink alcohol.  Take sleeping pills or medicines that cause drowsiness.  Make important decisions or sign legal documents.  Take care of children on your own.  Rest. Eating and drinking   Follow the diet that is recommended by your health care provider.  If you vomit, drink water, juice, or soup when you can drink without vomiting.  Make sure you have little or no nausea before eating solid foods. General instructions   Have a responsible adult stay with you until you are awake and alert.  Take over-the-counter and prescription medicines only as told by your health care provider.  If you smoke, do not smoke without supervision.  Keep all follow-up visits as told by your health care provider. This is important. Contact a health care provider if:  You keep feeling nauseous or you keep vomiting.  You feel light-headed.  You develop a rash.  You have a fever. Get help right away if:  You have trouble  breathing. This information is not intended to replace advice given to you by your health care provider. Make sure you discuss any questions you have with your health care provider. Document Released: 05/29/2015 Document Revised: 09/28/2015 Document Reviewed: 05/29/2015 Elsevier Interactive Patient Education  2017 [place] POST-ANESTHESIA  IMMEDIATELY FOLLOWING SURGERY:  Do not drive or operate machinery for the first twenty four hours after surgery.  Do not make any important decisions for twenty four hours after surgery or while taking narcotic pain medications or sedatives.  If you develop intractable nausea and vomiting or a severe headache please notify your doctor immediately.  FOLLOW-UP:  Please make an appointment with your surgeon as instructed. You do not need to follow up with anesthesia unless specifically instructed to do so.  WOUND CARE INSTRUCTIONS (if applicable):  Keep a dry clean dressing on the anesthesia/puncture wound site if there is drainage.  Once the wound has quit draining you may leave it open to air.  Generally you should leave the bandage intact for twenty four hours unless there is drainage.  If the epidural site drains for more than 36-48 hours please call the anesthesia department.  QUESTIONS?:  Please feel free to call your physician or the hospital operator if you have any questions, and they will be happy to assist you.

## 2016-06-25 ENCOUNTER — Encounter: Payer: Self-pay | Admitting: Obstetrics & Gynecology

## 2016-06-25 ENCOUNTER — Ambulatory Visit (INDEPENDENT_AMBULATORY_CARE_PROVIDER_SITE_OTHER): Payer: Medicaid Other | Admitting: Obstetrics & Gynecology

## 2016-06-25 VITALS — BP 138/80 | HR 90 | Ht 64.0 in | Wt 238.0 lb

## 2016-06-25 DIAGNOSIS — Z3009 Encounter for other general counseling and advice on contraception: Secondary | ICD-10-CM

## 2016-06-25 NOTE — Progress Notes (Signed)
Chief Complaint  Patient presents with  . Pre-op Exam    wants tubal    Blood pressure 138/80, pulse 90, height 5\' 4"  (1.626 m), weight 238 lb (108 kg).  36 y.o. [id number] No LMP recorded. Patient has had an injection. The current method of family planning is Depo-Provera injections.  Outpatient Encounter Prescriptions as of 06/25/2016  Medication Sig Note  . albuterol (PROVENTIL HFA;VENTOLIN HFA) 108 (90 BASE) MCG/ACT inhaler Inhale 1-2 puffs into the lungs every 6 (six) hours as needed for wheezing or shortness of breath. (Patient taking differently: Inhale 1-2 puffs into the lungs every 6 (six) hours as needed for wheezing or shortness of breath. For wheezing/shortness of breath)   . ibuprofen (ADVIL,MOTRIN) 200 MG tablet Take 200 mg by mouth every 6 (six) hours as needed (for pain.).   Marland Kitchen MedroxyPROGESTERone Acetate 150 MG/ML SUSY INJECT 1 ML (150MG ) INTRAMUSCULARLY EVERY 3 MONTHS. 06/21/2016: Last Injection=2-3 weeks ago  . [DISCONTINUED] diclofenac (VOLTAREN) 75 MG EC tablet Take 1 tablet (75 mg total) by mouth 2 (two) times daily. Take with food (Patient not taking: Reported on 06/21/2016)   . [DISCONTINUED] megestrol (MEGACE) 40 MG tablet 3x5d, 2x5d, then 1 daily. Stop taking 1 week after bleeding stops. (Patient not taking: Reported on 06/06/2016)   . [DISCONTINUED] predniSONE (STERAPRED UNI-PAK 21 TAB) 5 MG (21) TBPK tablet Take 6 pills first day; 5 pills second day; 4 pills third day; 3 pills fourth day; 2 pills next day and 1 pill last day. (Patient not taking: Reported on 05/28/2016)    No facility-administered encounter medications on file as of 06/25/2016.     Subjective Pt states she wants her tubes tied for sure this time Has not signed tubal papers  Objective   Pertinent ROS No burning with urination, frequency or urgency No nausea, vomiting or diarrhea Nor fever chills or other constitutional symptoms   Labs or studies none    Impression Diagnoses this  Encounter::   ICD-9-CM ICD-10-CM   1. Sterilization consult V25.09 Z30.09     Established relevant diagnosis(es):   Plan/Recommendations: No orders of the defined types were placed in this encounter.   Labs or Scans Ordered: No orders of the defined types were placed in this encounter.   Management:: Pt states once again wants permanent sterilization, she has scheduled and cancelled 2 previous times that I know of Sign BTL papers toay schedule in June after 30 days Follow up Return in about 6 weeks (around 08/06/2016).        Face to face time:  15 minutes  Greater than 50% of the visit time was spent in counseling and coordination of care with the patient.  The summary and outline of the counseling and care coordination is summarized in the note above.   All questions were answered.  Past Medical History:  Diagnosis Date  . Abnormal Pap smear   . Arthritis   . Asthma   . Bipolar 1 disorder ([place])   . Bronchitis   . Carpal tunnel syndrome   . Chest pain   . Elevated cholesterol   . High cholesterol   . Hyperlipidemia   . Hyperlipoproteinemia   . Post traumatic stress disorder   . Right ovarian cyst 10/31/2012   Complex right ovarian cyst seen 8/18 in ER need f/u US  . Tendonitis     Past Surgical History:  Procedure Laterality Date  . HERNIA REPAIR      OB History  Gravida Para Term Preterm AB Living   7 6 6   1 4    SAB TAB Ectopic Multiple Live Births   1       6      Allergies  Allergen Reactions  . Darvocet [Propoxyphene N-Acetaminophen] Itching  . Hydrocodone-Acetaminophen Itching  . Lactose Intolerance (Gi) Other (See Comments)    Causes stomach cramping.  . Other Hives    Patient is allergic to corndogs.  . Penicillins Hives and Itching    Has patient had a PCN reaction causing immediate rash, facial/tongue/throat swelling, SOB or lightheadedness with hypotension:Yes Has patient had a PCN reaction causing severe rash involving mucus  membranes or skin necrosis:No Has patient had a PCN reaction that required hospitalization:No Has patient had a PCN reaction occurring within the last 10 years:No If all of the above answers are "NO", then may proceed with Cephalosporin use.   . Latex Itching and Rash  . Lortab [Hydrocodone-Acetaminophen] Rash  . Percocet [Oxycodone-Acetaminophen] Itching and Rash    Social History   Social History  . Marital status: Legally Separated    Spouse name: N/A  . Number of children: N/A  . Years of education: N/A   Social History Main Topics  . Smoking status: Never Smoker  . Smokeless tobacco: Never Used  . Alcohol use No  . Drug use: No  . Sexual activity: Not Currently    Birth control/ protection: Injection   Other Topics Concern  . None   Social History Narrative  . None    Family History  Problem Relation Age of Onset  . Diabetes Mother   . Hypertension Mother   . Asthma Father   . Bronchitis Daughter   . Asthma Daughter   . Stroke Paternal Grandmother   . Heart attack Paternal Grandmother   . Cancer Paternal Grandmother   . Asthma Son   . Bronchitis Son   . Asthma Daughter   . Bronchitis Daughter   . Asthma Son   . Asthma Other   . Bronchitis Other   . Cancer Cousin

## 2016-06-26 ENCOUNTER — Encounter (HOSPITAL_COMMUNITY)
Admission: RE | Admit: 2016-06-26 | Discharge: 2016-06-26 | Disposition: A | Discharge status: Home / Self Care | Payer: Medicaid Other | Source: Ambulatory Visit | Attending: Orthopedic Surgery | Admitting: Orthopedic Surgery

## 2016-06-26 ENCOUNTER — Encounter (HOSPITAL_COMMUNITY): Payer: Self-pay

## 2016-06-26 DIAGNOSIS — J45909 Unspecified asthma, uncomplicated: Secondary | ICD-10-CM | POA: Diagnosis not present

## 2016-06-26 DIAGNOSIS — G5601 Carpal tunnel syndrome, right upper limb: Secondary | ICD-10-CM | POA: Insufficient documentation

## 2016-06-26 DIAGNOSIS — Z833 Family history of diabetes mellitus: Secondary | ICD-10-CM | POA: Insufficient documentation

## 2016-06-26 DIAGNOSIS — Z809 Family history of malignant neoplasm, unspecified: Secondary | ICD-10-CM | POA: Insufficient documentation

## 2016-06-26 DIAGNOSIS — F3189 Other bipolar disorder: Secondary | ICD-10-CM | POA: Insufficient documentation

## 2016-06-26 DIAGNOSIS — Z825 Family history of asthma and other chronic lower respiratory diseases: Secondary | ICD-10-CM | POA: Diagnosis not present

## 2016-06-26 DIAGNOSIS — Z8249 Family history of ischemic heart disease and other diseases of the circulatory system: Secondary | ICD-10-CM | POA: Insufficient documentation

## 2016-06-26 DIAGNOSIS — Z823 Family history of stroke: Secondary | ICD-10-CM | POA: Insufficient documentation

## 2016-06-26 DIAGNOSIS — Z01812 Encounter for preprocedural laboratory examination: Secondary | ICD-10-CM | POA: Insufficient documentation

## 2016-06-26 DIAGNOSIS — Z9889 Other specified postprocedural states: Secondary | ICD-10-CM | POA: Diagnosis not present

## 2016-06-26 LAB — BASIC METABOLIC PANEL
Anion gap: 7 (ref 5–15)
BUN: 10 mg/dL (ref 6–20)
CHLORIDE: 107 mmol/L (ref 101–111)
CO2: 26 mmol/L (ref 22–32)
CREATININE: 0.61 mg/dL (ref 0.44–1.00)
Calcium: 9.7 mg/dL (ref 8.9–10.3)
GFR calc Af Amer: >60 mL/min (ref >60)
GFR calc non Af Amer: >60 mL/min (ref >60)
GLUCOSE: 114 mg/dL — AB (ref 65–99)
POTASSIUM: 3.7 mmol/L (ref 3.5–5.1)
SODIUM: 140 mmol/L (ref 135–145)

## 2016-06-26 LAB — CBC WITH DIFFERENTIAL/PLATELET
Basophils Absolute: 0 10*3/uL (ref 0.0–0.1)
Basophils Relative: 0 %
EOS ABS: 0.1 10*3/uL (ref 0.0–0.7)
Eosinophils Relative: 1 %
HEMATOCRIT: 40.1 % (ref 36.0–46.0)
HEMOGLOBIN: 13.2 g/dL (ref 12.0–15.0)
Lymphocytes Relative: 41 %
Lymphs Abs: 3.8 10*3/uL (ref 0.7–4.0)
MCH: 31.1 pg (ref 26.0–34.0)
MCHC: 32.9 g/dL (ref 30.0–36.0)
MCV: 94.4 fL (ref 78.0–100.0)
MONOS PCT: 5 %
Monocytes Absolute: 0.5 10*3/uL (ref 0.1–1.0)
NEUTROS PCT: 53 %
Neutro Abs: 5 10*3/uL (ref 1.7–7.7)
Platelets: 306 10*3/uL (ref 150–400)
RBC: 4.25 MIL/uL (ref 3.87–5.11)
RDW: 12.1 % (ref 11.5–15.5)
WBC: 9.3 10*3/uL (ref 4.0–10.5)

## 2016-06-26 LAB — HCG, SERUM, QUALITATIVE: Preg, Serum: NEGATIVE

## 2016-06-27 NOTE — H&P (Signed)
HISTORY AND PHYSICAL        Chief Complaint  Patient presents with  . Carpal Tunnel      Right Surgery consult    HPI 36 yo female with bilateral wrist pain and numbness and tingling of the right and left hand for several months ; she had ncs/emg which show bilateral carpal tunnel syndrome. She c/o weakness and poor grip strength and pain  Review of Systems  Neurological: Positive for tingling.  All other systems reviewed and are negative.       Past Medical History:  Diagnosis Date  . Abnormal Pap smear    . Asthma    . Bipolar 1 disorder ([place])    . Bronchitis    . Carpal tunnel syndrome    . Chest pain    . Elevated cholesterol    . High cholesterol    . Hyperlipidemia    . Hyperlipoproteinemia    . Post traumatic stress disorder    . Right ovarian cyst 10/31/2012    Complex right ovarian cyst seen 8/18 in ER need f/u US           Past Surgical History:  Procedure Laterality Date  . HERNIA REPAIR             Family History  Problem Relation Age of Onset  . Diabetes Mother    . Hypertension Mother    . Asthma Father    . Bronchitis Daughter    . Asthma Daughter    . Stroke Paternal Grandmother    . Heart attack Paternal Grandmother    . Cancer Paternal Grandmother    . Asthma Son    . Bronchitis Son    . Asthma Daughter    . Bronchitis Daughter    . Asthma Son    . Asthma Other    . Bronchitis Other    . Cancer Cousin          Social History  Substance Use Topics  . Smoking status: Never Smoker  . Smokeless tobacco: Never Used  . Alcohol use No    Active Medications  No outpatient prescriptions have been marked as taking for the 06/06/16 encounter (Office Visit) with Carole Civil, MD.        BP 132/88   Pulse 83   Ht 5\' 5"  (1.651 m)    Physical Exam  Constitutional: She is oriented to person, place, and time. She appears well-developed and well-nourished. No distress.  HENT:  Head: Normocephalic and atraumatic.  Right Ear: External  ear normal.  Left Ear: External ear normal.  Mouth/Throat: Oropharynx is clear and moist. No oropharyngeal exudate.  Eyes: Conjunctivae and EOM are normal. Pupils are equal, round, and reactive to light. Right eye exhibits no discharge. Left eye exhibits no discharge. No scleral icterus.  Neck: Neck supple. No JVD present. No tracheal deviation present. No thyromegaly present.  Cardiovascular: Normal rate, regular rhythm and intact distal pulses.   Pulmonary/Chest: Effort normal. No stridor.  Abdominal: She exhibits no distension.  Musculoskeletal:       Right shoulder: She exhibits normal range of motion, no tenderness, no bony tenderness, no swelling, no effusion, no crepitus, no deformity, no laceration, no pain, no spasm, normal pulse and normal strength.       Left shoulder: She exhibits normal range of motion, no tenderness, no bony tenderness, no swelling, no effusion, no crepitus, no deformity, no laceration, no pain, no spasm, normal pulse and normal strength.  Right wrist: She exhibits tenderness. She exhibits normal range of motion, no bony tenderness, no swelling, no effusion, no crepitus, no deformity and no laceration.       Left wrist: She exhibits tenderness. She exhibits normal range of motion, no bony tenderness, no swelling, no effusion, no crepitus, no deformity and no laceration.  Lymphadenopathy:    She has no cervical adenopathy.       Right: No supraclavicular and no epitrochlear adenopathy present.       Left: Epitrochlear adenopathy present. No supraclavicular adenopathy present.  Neurological: She is alert and oriented to person, place, and time. She has normal reflexes. She displays no atrophy and normal reflexes. A sensory deficit is present. No cranial nerve deficit. She exhibits normal muscle tone. She displays no seizure activity. Coordination and gait normal. GCS eye subscore is 4. GCS verbal subscore is 5. GCS motor subscore is 6.  Medial nerve right and left  hand decrease in sensation   Skin: Skin is warm and dry. Capillary refill takes less than 2 seconds. No rash noted. She is not diaphoretic. No cyanosis or erythema. No pallor. Nails show no clubbing.  Psychiatric: She has a normal mood and affect. Her behavior is normal. Judgment and thought content normal.      Right Hand Exam    Tenderness  The patient is experiencing tenderness in the palmer area.   Range of Motion  The patient has normal right wrist ROM.    Muscle Strength  Wrist Extension: 5/5  Wrist Flexion: 5/5  Grip: 4/5    Tests  Phalen's Sign: positive Tinel's Sign (Medial Nerve): positive Finkelstein: negative   Other  Sensation: decreased Pulse: present     Left Hand Exam    Tenderness  The patient is experiencing tenderness in the palmer area.    Range of Motion  The patient has normal left wrist ROM.   Muscle Strength  Wrist Extension: 5/5  Wrist Flexion: 5/5  Grip:  4/5    Tests  Phalen's Sign: positive Tinel's Sign (Medial Nerve): positive Finkelstein: negative   Other  Sensation: decreased Pulse: present     Right Elbow Exam    Tenderness  The patient is experiencing no tenderness.      Range of Motion  The patient has normal right elbow ROM.   Muscle Strength  The patient has normal right elbow strength.   Tests Varus: negative Valgus: negative Tinel's Sign (cubital tunnel): negative   Left Elbow Exam    Tenderness  The patient is experiencing no tenderness.     Range of Motion  The patient has normal left elbow ROM.   Muscle Strength  The patient has normal left elbow strength.   Tests Varus: negative Valgus: negative Tinel's Sign (cubital tunnel): positive    ASSESSMENT: ncs emg  Bilateral carpal tunnel syndrome      PLAN Right carpal tunnel release   This procedure has been fully reviewed with the patient and written informed consent has been obtained.

## 2016-06-28 ENCOUNTER — Ambulatory Visit (HOSPITAL_COMMUNITY): Payer: Medicaid Other | Admitting: Anesthesiology

## 2016-06-28 ENCOUNTER — Encounter (HOSPITAL_COMMUNITY)
Admission: RE | Disposition: A | Discharge status: Home / Self Care | Payer: Self-pay | Source: Ambulatory Visit | Attending: Orthopedic Surgery

## 2016-06-28 ENCOUNTER — Telehealth: Payer: Self-pay | Admitting: Orthopedic Surgery

## 2016-06-28 ENCOUNTER — Ambulatory Visit (HOSPITAL_COMMUNITY)
Admission: RE | Admit: 2016-06-28 | Discharge: 2016-06-28 | Disposition: A | Discharge status: Home / Self Care | Payer: Medicaid Other | Source: Ambulatory Visit | Attending: Orthopedic Surgery | Admitting: Orthopedic Surgery

## 2016-06-28 ENCOUNTER — Encounter (HOSPITAL_COMMUNITY): Payer: Self-pay | Admitting: *Deleted

## 2016-06-28 ENCOUNTER — Encounter: Payer: Self-pay | Admitting: Orthopedic Surgery

## 2016-06-28 DIAGNOSIS — Z6841 Body Mass Index (BMI) 40.0 and over, adult: Secondary | ICD-10-CM | POA: Insufficient documentation

## 2016-06-28 DIAGNOSIS — E7800 Pure hypercholesterolemia, unspecified: Secondary | ICD-10-CM | POA: Insufficient documentation

## 2016-06-28 DIAGNOSIS — G5603 Carpal tunnel syndrome, bilateral upper limbs: Secondary | ICD-10-CM | POA: Diagnosis present

## 2016-06-28 DIAGNOSIS — J45909 Unspecified asthma, uncomplicated: Secondary | ICD-10-CM | POA: Insufficient documentation

## 2016-06-28 DIAGNOSIS — E785 Hyperlipidemia, unspecified: Secondary | ICD-10-CM | POA: Insufficient documentation

## 2016-06-28 DIAGNOSIS — G5601 Carpal tunnel syndrome, right upper limb: Secondary | ICD-10-CM

## 2016-06-28 DIAGNOSIS — F319 Bipolar disorder, unspecified: Secondary | ICD-10-CM | POA: Diagnosis not present

## 2016-06-28 DIAGNOSIS — F4310 Post-traumatic stress disorder, unspecified: Secondary | ICD-10-CM | POA: Diagnosis not present

## 2016-06-28 HISTORY — PX: CARPAL TUNNEL RELEASE: SHX101

## 2016-06-28 SURGERY — CARPAL TUNNEL RELEASE
Anesthesia: Regional | Laterality: Right

## 2016-06-28 MED ORDER — FENTANYL CITRATE (PF) 100 MCG/2ML IJ SOLN
INTRAMUSCULAR | Status: DC | PRN
  Administered 2016-06-28: 25 ug via INTRAVENOUS
  Administered 2016-06-28: 50 ug via INTRAVENOUS
  Administered 2016-06-28: 25 ug via INTRAVENOUS

## 2016-06-28 MED ORDER — DIPHENHYDRAMINE HCL 50 MG/ML IJ SOLN
INTRAMUSCULAR | Status: AC
  Filled 2016-06-28: qty 1

## 2016-06-28 MED ORDER — LIDOCAINE HCL (CARDIAC) 10 MG/ML IV SOLN
INTRAVENOUS | Status: DC | PRN
  Administered 2016-06-28: 50 mg via INTRAVENOUS

## 2016-06-28 MED ORDER — LIDOCAINE HCL (PF) 1 % IJ SOLN
INTRAMUSCULAR | Status: AC
  Filled 2016-06-28: qty 5

## 2016-06-28 MED ORDER — VANCOMYCIN HCL IN DEXTROSE 1-5 GM/200ML-% IV SOLN
INTRAVENOUS | Status: AC
  Filled 2016-06-28: qty 200

## 2016-06-28 MED ORDER — FENTANYL CITRATE (PF) 100 MCG/2ML IJ SOLN: 25.0000 ug | INTRAMUSCULAR | Status: DC | PRN

## 2016-06-28 MED ORDER — BUPIVACAINE HCL (PF) 0.5 % IJ SOLN
INTRAMUSCULAR | Status: DC | PRN
  Administered 2016-06-28: 10 mL

## 2016-06-28 MED ORDER — 0.9 % SODIUM CHLORIDE (POUR BTL) OPTIME
TOPICAL | Status: DC | PRN
  Administered 2016-06-28: 1000 mL

## 2016-06-28 MED ORDER — DIPHENHYDRAMINE HCL 50 MG/ML IJ SOLN
25.0000 mg | Freq: Once | INTRAMUSCULAR | Status: AC
  Administered 2016-06-28: 25 mg via INTRAVENOUS

## 2016-06-28 MED ORDER — SODIUM CHLORIDE 0.9% FLUSH
INTRAVENOUS | Status: AC
  Filled 2016-06-28: qty 10

## 2016-06-28 MED ORDER — PROPOFOL 500 MG/50ML IV EMUL
INTRAVENOUS | Status: DC | PRN
  Administered 2016-06-28: 100 ug/kg/min via INTRAVENOUS

## 2016-06-28 MED ORDER — IBUPROFEN 800 MG PO TABS: 800.0000 mg | ORAL_TABLET | Freq: Three times a day (TID) | ORAL | 1 refills | Status: DC | PRN

## 2016-06-28 MED ORDER — LIDOCAINE HCL (PF) 1 % IJ SOLN
INTRAMUSCULAR | Status: AC
Start: 2016-06-28 — End: 2016-06-28
  Filled 2016-06-28: qty 5

## 2016-06-28 MED ORDER — FENTANYL CITRATE (PF) 100 MCG/2ML IJ SOLN
25.0000 ug | INTRAMUSCULAR | Status: DC | PRN
  Administered 2016-06-28: 25 ug via INTRAVENOUS

## 2016-06-28 MED ORDER — PROPOFOL 10 MG/ML IV BOLUS
INTRAVENOUS | Status: DC | PRN
  Administered 2016-06-28: 20 mg via INTRAVENOUS

## 2016-06-28 MED ORDER — FENTANYL CITRATE (PF) 100 MCG/2ML IJ SOLN
INTRAMUSCULAR | Status: AC
  Filled 2016-06-28: qty 2

## 2016-06-28 MED ORDER — LIDOCAINE HCL (PF) 0.5 % IJ SOLN
INTRAMUSCULAR | Status: AC
  Filled 2016-06-28: qty 50

## 2016-06-28 MED ORDER — CHLORHEXIDINE GLUCONATE 4 % EX LIQD: 60.0000 mL | Freq: Once | CUTANEOUS | Status: DC

## 2016-06-28 MED ORDER — MIDAZOLAM HCL 2 MG/2ML IJ SOLN
INTRAMUSCULAR | Status: AC
  Filled 2016-06-28: qty 2

## 2016-06-28 MED ORDER — VANCOMYCIN HCL 10 G IV SOLR
1500.0000 mg | INTRAVENOUS | Status: AC
  Administered 2016-06-28: 1500 mg via INTRAVENOUS
  Filled 2016-06-28: qty 1500

## 2016-06-28 MED ORDER — DEXAMETHASONE SODIUM PHOSPHATE 4 MG/ML IJ SOLN
4.0000 mg | Freq: Once | INTRAMUSCULAR | Status: AC
  Administered 2016-06-28: 4 mg via INTRAVENOUS

## 2016-06-28 MED ORDER — LACTATED RINGERS IV SOLN
INTRAVENOUS | Status: DC
  Administered 2016-06-28: 09:00:00 via INTRAVENOUS

## 2016-06-28 MED ORDER — MIDAZOLAM HCL 2 MG/2ML IJ SOLN
1.0000 mg | INTRAMUSCULAR | Status: DC | PRN
  Administered 2016-06-28: 2 mg via INTRAVENOUS

## 2016-06-28 MED ORDER — PROPOFOL 10 MG/ML IV BOLUS
INTRAVENOUS | Status: AC
  Filled 2016-06-28: qty 40

## 2016-06-28 MED ORDER — PROPOFOL 10 MG/ML IV BOLUS
INTRAVENOUS | Status: AC
  Filled 2016-06-28: qty 20

## 2016-06-28 MED ORDER — BUPIVACAINE HCL (PF) 0.5 % IJ SOLN
INTRAMUSCULAR | Status: AC
  Filled 2016-06-28: qty 30

## 2016-06-28 MED ORDER — DEXAMETHASONE SODIUM PHOSPHATE 4 MG/ML IJ SOLN
INTRAMUSCULAR | Status: AC
  Filled 2016-06-28: qty 1

## 2016-06-28 MED ORDER — TRAMADOL-ACETAMINOPHEN 37.5-325 MG PO TABS: 1.0000 | ORAL_TABLET | Freq: Four times a day (QID) | ORAL | 0 refills | Status: DC | PRN

## 2016-06-28 SURGICAL SUPPLY — 40 items
BAG HAMPER (MISCELLANEOUS) ×3 IMPLANT
BANDAGE ELASTIC 3 LF NS (GAUZE/BANDAGES/DRESSINGS) ×3 IMPLANT
BANDAGE ESMARK 4X12 BL STRL LF (DISPOSABLE) ×1 IMPLANT
BLADE SURG 15 STRL LF DISP TIS (BLADE) ×1 IMPLANT
BLADE SURG 15 STRL SS (BLADE) ×3
BNDG CMPR 12X4 ELC STRL LF (DISPOSABLE) ×1
BNDG CMPR MED 5X3 ELC HKLP NS (GAUZE/BANDAGES/DRESSINGS) ×1
BNDG COHESIVE 4X5 TAN STRL (GAUZE/BANDAGES/DRESSINGS) ×3 IMPLANT
BNDG ESMARK 4X12 BLUE STRL LF (DISPOSABLE) ×3
BNDG GAUZE ELAST 4 BULKY (GAUZE/BANDAGES/DRESSINGS) ×2 IMPLANT
CHLORAPREP W/TINT 26ML (MISCELLANEOUS) ×3 IMPLANT
CLOTH BEACON ORANGE TIMEOUT ST (SAFETY) ×3 IMPLANT
COVER LIGHT HANDLE STERIS (MISCELLANEOUS) ×6 IMPLANT
CUFF TOURNIQUET SINGLE 24IN (TOURNIQUET CUFF) ×2 IMPLANT
DECANTER SPIKE VIAL GLASS SM (MISCELLANEOUS) ×3 IMPLANT
DRAPE PROXIMA HALF (DRAPES) ×3 IMPLANT
DRSG XEROFORM 1X8 (GAUZE/BANDAGES/DRESSINGS) ×2 IMPLANT
ELECT REM PT RETURN 9FT ADLT (ELECTROSURGICAL) ×3
ELECTRODE REM PT RTRN 9FT ADLT (ELECTROSURGICAL) ×1 IMPLANT
GAUZE SPONGE 4X4 12PLY STRL (GAUZE/BANDAGES/DRESSINGS) ×2 IMPLANT
GLOVE BIOGEL PI IND STRL 7.0 (GLOVE) ×1 IMPLANT
GLOVE BIOGEL PI INDICATOR 7.0 (GLOVE) ×4
GLOVE SKINSENSE NS SZ8.0 LF (GLOVE) ×2
GLOVE SKINSENSE STRL SZ8.0 LF (GLOVE) ×1 IMPLANT
GLOVE SS N UNI LF 8.5 STRL (GLOVE) ×3 IMPLANT
GLOVE SURG SS PI 6.5 STRL IVOR (GLOVE) ×2 IMPLANT
GOWN STRL REUS W/ TWL LRG LVL3 (GOWN DISPOSABLE) ×1 IMPLANT
GOWN STRL REUS W/TWL LRG LVL3 (GOWN DISPOSABLE) ×3
GOWN STRL REUS W/TWL XL LVL3 (GOWN DISPOSABLE) ×3 IMPLANT
HAND ALUMI XLG (SOFTGOODS) ×3 IMPLANT
KIT ROOM TURNOVER APOR (KITS) ×3 IMPLANT
MANIFOLD NEPTUNE II (INSTRUMENTS) ×3 IMPLANT
NDL HYPO 21X1.5 SAFETY (NEEDLE) ×1 IMPLANT
NEEDLE HYPO 21X1.5 SAFETY (NEEDLE) ×3 IMPLANT
NS IRRIG 1000ML POUR BTL (IV SOLUTION) ×3 IMPLANT
PACK BASIC LIMB (CUSTOM PROCEDURE TRAY) ×3 IMPLANT
PAD ARMBOARD 7.5X6 YLW CONV (MISCELLANEOUS) ×3 IMPLANT
SET BASIN LINEN APH (SET/KITS/TRAYS/PACK) ×3 IMPLANT
SUT ETHILON 3 0 FSL (SUTURE) ×3 IMPLANT
SYR CONTROL 10ML LL (SYRINGE) ×3 IMPLANT

## 2016-06-28 NOTE — Brief Op Note (Signed)
06/28/2016  10:49 AM  PATIENT:  Breanna Malone  36 y.o. female  PRE-OPERATIVE DIAGNOSIS:  right carpal tunnel syndrome  POST-OPERATIVE DIAGNOSIS:  right carpal tunnel syndrome  PROCEDURE:  Procedure(s): RIGHT CARPAL TUNNEL RELEASE (Right)  SURGEON:  Surgeon(s) and Role:    Carole Civil, MD - Primary  PHYSICIAN ASSISTANT:   ASSISTANTS: NO  ANESTHESIA:   regional  EBL:  Total I/O In: 300 [I.V.:300] Out: -   BLOOD ADMINISTERED:none  DRAINS: none   LOCAL MEDICATIONS USED:  MARCAINE PLAIN and Amount: 10 ml  SPECIMEN:  No Specimen  DISPOSITION OF SPECIMEN:  N/A  COUNTS:  YES  TOURNIQUET:   Total Tourniquet Time Documented: Upper Arm (Right) - -[id number] minutes Total: Upper Arm (Right) - -[id number] minutes   DICTATION: .Viviann Spare Dictation  PLAN OF CARE: Discharge to home after PACU  PATIENT DISPOSITION:  PACU - hemodynamically stable.   Delay start of Pharmacological VTE agent (>24hrs) due to surgical blood loss or risk of bleeding: not applicable

## 2016-06-28 NOTE — Transfer of Care (Signed)
Immediate Anesthesia Transfer of Care Note  Patient: Breanna Malone  Procedure(s) Performed: Procedure(s): RIGHT CARPAL TUNNEL RELEASE (Right)  Patient Location: PACU  Anesthesia Type:MAC  Level of Consciousness: awake and patient cooperative  Airway & Oxygen Therapy: Patient Spontanous Breathing and Patient connected to nasal cannula oxygen  Post-op Assessment: Report given to RN and Post -op Vital signs reviewed and stable  Post vital signs: Reviewed and stable  Last Vitals:  Vitals:   06/28/16 1010 06/28/16 1053  BP:  (!) (P) 128/91  Pulse:  (P) 93  Resp: 16 (P) 18  Temp:  (P) 37.1 C    Last Pain:  Vitals:   06/28/16 0902  TempSrc: Oral         Complications: No apparent anesthesia complications

## 2016-06-28 NOTE — Op Note (Addendum)
  Carpal tunnel release right wrist  Preop diagnosis carpal tunnel syndrome right wrist   postop diagnosis same  Procedure open carpal tunnel release right wrist  Surgeon Aline Brochure  [phone]  Anesthesia regional Bier block  Operative findings compression of the right median nerveoccupied lesions  Indications failure of conservative treatment to relieve pain and paresthesias and numbness and tingling of the right hand.  The patient was identified in the preop area we confirm the surgical site marked as right wrist. Chart update completed. Patient taken to surgery. We started 1.5 gm Vancomycin . After establishing a Bier block her arm was prepped with ChloraPrep.  Timeout executed completed and confirmed site.  A straight incision was made over the carpal tunnel in line with the radial border of the ring finger. Blunt dissection was carried out to find the distal aspect of the carpal tunnel. A blunted judgment was passed beneath the carpal tunnel. Sharp incision was then used to release the transverse carpal ligament. The contents of the carpal tunnel were inspected. The median nerve was compressed with slight discoloration.  The wound was irrigated and then closed with 3-0 nylon suture. We injected 10 mL of plain Marcaine on the radial side of the incision  A sterile bandage was applied and the tourniquet was released the color of the hand and capillary refill were normal  The patient was taken to the recovery room in stable condition  Addendum at 12:10 PM on 06/29/2006 the patient did not finish her vancomycin due to rash redness. She was given Decadron and Benadryl and the medication was stopped.

## 2016-06-28 NOTE — Anesthesia Postprocedure Evaluation (Signed)
Anesthesia Post Note  Patient: Breanna Malone  Procedure(s) Performed: Procedure(s) (LRB): RIGHT CARPAL TUNNEL RELEASE (Right)  Patient location during evaluation: Short Stay Anesthesia Type: Bier Block Level of consciousness: awake Pain management: satisfactory to patient Vital Signs Assessment: post-procedure vital signs reviewed and stable Respiratory status: spontaneous breathing Cardiovascular status: stable Anesthetic complications: no     Last Vitals:  Vitals:   06/28/16 1145 06/28/16 1201  BP: 134/85 (!) 147/82  Pulse: 67 77  Resp: 14 18  Temp:  37.1 C    Last Pain:  Vitals:   06/28/16 1201  TempSrc: Oral  PainSc:                  Drucie Opitz

## 2016-06-28 NOTE — Anesthesia Procedure Notes (Signed)
Anesthesia Regional Block: Bier block (IV Regional)   Pre-Anesthetic Checklist: ,, timeout performed, Correct Patient, Correct Site, Correct Laterality, Correct Procedure, Correct Position, site marked, surgical consent, at surgeon's request  Laterality: Right     Needles:  Injection technique: Single-shot  Needle Type: Other      Needle Gauge: 22     Additional Needles:   Procedures:,,,,,,, Esmarch exsanguination, single tourniquet utilized,   Nerve Stimulator or Paresthesia:   Additional Responses:  Pulse checked post tourniquet inflation. IV NSL discontinued post injection. Narrative:  Start time: 06/28/2016 10:22 AM End time: 06/28/2016 10:24 AM  Performed by: Personally

## 2016-06-28 NOTE — Anesthesia Preprocedure Evaluation (Signed)
Anesthesia Evaluation  Patient identified by MRN, date of birth, ID band Patient awake    Reviewed: Allergy & Precautions, H&P , NPO status , Patient's Chart, lab work & pertinent test results  Airway Mallampati: II  TM Distance: >3 FB Neck ROM: full    Dental no notable dental hx.    Pulmonary asthma ,    Pulmonary exam normal        Cardiovascular negative cardio ROS Normal cardiovascular exam     Neuro/Psych PSYCHIATRIC DISORDERS (PTSD) Bipolar Disorder  Neuromuscular disease (caroal tunnel)    GI/Hepatic negative GI ROS, Neg liver ROS,   Endo/Other  negative endocrine ROSMorbid obesity  Renal/GU negative Renal ROS     Musculoskeletal   Abdominal   Peds  Hematology negative hematology ROS (+)   Anesthesia Other Findings   Reproductive/Obstetrics                             Anesthesia Physical Anesthesia Plan  ASA: II  Anesthesia Plan: Bier Block   Post-op Pain Management:    Induction: Intravenous  Airway Management Planned: Simple Face Mask  Additional Equipment:   Intra-op Plan:   Post-operative Plan:   Informed Consent: I have reviewed the patients History and Physical, chart, labs and discussed the procedure including the risks, benefits and alternatives for the proposed anesthesia with the patient or authorized representative who has indicated his/her understanding and acceptance.     Plan Discussed with:   Anesthesia Plan Comments:         Anesthesia Quick Evaluation

## 2016-06-28 NOTE — Telephone Encounter (Signed)
Breanna Malone called for the patient stating that she is having numbness in her fingertips and they are bluish in color.  Please advise [phone]

## 2016-06-28 NOTE — Interval H&P Note (Signed)
History and Physical Interval Note:  06/28/2016 10:06 AM  Breanna Malone  has presented today for surgery, with the diagnosis of right carpal tunnel syndrome  The various methods of treatment have been discussed with the patient and family. After consideration of risks, benefits and other options for treatment, the patient has consented to  Procedure(s): RIGHT CARPAL TUNNEL RELEASE (Right) as a surgical intervention .  The patient's history has been reviewed, patient examined, no change in status, stable for surgery.  I have reviewed the patient's chart and labs.  Questions were answered to the patient's satisfaction.     Arther Abbott

## 2016-06-28 NOTE — Discharge Instructions (Signed)

## 2016-06-28 NOTE — Progress Notes (Signed)
Pt compalined of itching and redness on face, hands, arms, and back. Vancomycin stopped. Dr. Patsey Berthold notified. Orders received for Benadryl and decadron. Given to pt. With relief noted immediately. Dr. Aline Brochure notified and said that it was ok to discontinue the vanc.

## 2016-06-28 NOTE — Anesthesia Procedure Notes (Signed)
Procedure Name: MAC Date/Time: 06/28/2016 10:11 AM Performed by: Vista Deck Pre-anesthesia Checklist: Patient identified, Emergency Drugs available, Suction available, Timeout performed and Patient being monitored Patient Re-evaluated:Patient Re-evaluated prior to inductionOxygen Delivery Method: Non-rebreather mask

## 2016-06-28 NOTE — Telephone Encounter (Signed)
Per Dr Aline Brochure this can be normal right after surgery  Advised patient of this and to ensure bandage not too tight, may loosen but not remove. Advised if still having symptoms in the morning may come in office for [place] to check.

## 2016-06-29 ENCOUNTER — Ambulatory Visit (INDEPENDENT_AMBULATORY_CARE_PROVIDER_SITE_OTHER): Payer: Medicaid Other | Admitting: Orthopedic Surgery

## 2016-06-29 ENCOUNTER — Encounter: Payer: Self-pay | Admitting: Orthopedic Surgery

## 2016-06-29 DIAGNOSIS — Z9889 Other specified postprocedural states: Secondary | ICD-10-CM

## 2016-06-29 DIAGNOSIS — G5601 Carpal tunnel syndrome, right upper limb: Secondary | ICD-10-CM

## 2016-06-29 NOTE — Progress Notes (Signed)
Patient ID: Breanna Malone, female   DOB: [birth year], 36 y.o.   MRN: [id number]  POSTOP VISIT   Chief Complaint  Patient presents with  . Follow-up    Post op recheck on right CTR, DOS 06-28-16.   C/O OF FINGERS BLUE  THE DRESSING WAS NOT TIGHT, THE CAP REFILL IS NORMAL   EVERYTHING LOOKS NORMAL   OP REPORT  PRE-OPERATIVE DIAGNOSIS:  right carpal tunnel syndrome   POST-OPERATIVE DIAGNOSIS:  right carpal tunnel syndrome   PROCEDURE:  Procedure(s): RIGHT CARPAL TUNNEL RELEASE (Right)   SURGEON:  Surgeon(s) and Role:    Carole Civil, MD - Primary    Encounter Diagnoses  Name Primary?  . Carpal tunnel syndrome of right wrist Yes  . History of carpal tunnel surgery of right wrist     KEEP APPT   10:06 AM Arther Abbott, MD 06/29/2016

## 2016-07-02 ENCOUNTER — Encounter (HOSPITAL_COMMUNITY): Payer: Self-pay | Admitting: Orthopedic Surgery

## 2016-07-02 ENCOUNTER — Ambulatory Visit (INDEPENDENT_AMBULATORY_CARE_PROVIDER_SITE_OTHER): Payer: Medicaid Other | Admitting: Orthopedic Surgery

## 2016-07-02 DIAGNOSIS — G5601 Carpal tunnel syndrome, right upper limb: Secondary | ICD-10-CM

## 2016-07-02 DIAGNOSIS — Z9889 Other specified postprocedural states: Secondary | ICD-10-CM

## 2016-07-02 NOTE — Progress Notes (Signed)
  Myria is status post right carpal tunnel release   S:   Chief Complaint  Patient presents with  . Follow-up    Rt CTS DOS 06/28/16   Patient reports that they're doing well with mild discomfort.  The incision is clean dry and intact with no drainage The dressing was changed.  Patient to return for suture removal ~ POD 12-14

## 2016-07-09 ENCOUNTER — Encounter: Payer: Self-pay | Admitting: Orthopedic Surgery

## 2016-07-09 ENCOUNTER — Ambulatory Visit (INDEPENDENT_AMBULATORY_CARE_PROVIDER_SITE_OTHER): Payer: Medicaid Other | Admitting: Orthopedic Surgery

## 2016-07-09 DIAGNOSIS — Z4889 Encounter for other specified surgical aftercare: Secondary | ICD-10-CM

## 2016-07-09 NOTE — Patient Instructions (Signed)
Work on bending the fingers

## 2016-07-09 NOTE — Progress Notes (Signed)
Postop status post carpal tunnel release on May 10 right wrist. She says she still can't hold things. She has 75% of her finger flexion back  Her wound is clean dry and intact  Recommend 4 week follow-up continue home exercises to regain full range of motion

## 2016-07-27 ENCOUNTER — Other Ambulatory Visit (HOSPITAL_COMMUNITY): Payer: Medicaid Other

## 2016-07-27 ENCOUNTER — Encounter: Payer: Self-pay | Admitting: Orthopedic Surgery

## 2016-07-27 ENCOUNTER — Telehealth: Payer: Self-pay | Admitting: Orthopedic Surgery

## 2016-07-27 NOTE — Telephone Encounter (Signed)
Patient came by office, relays work note is ending 07/29/16.  Requests to go back to work prior to her next scheduled appointment, 08/06/16.  Reviewed with Dr Aline Brochure, okay for patient to return as requested. Note issued.

## 2016-08-01 ENCOUNTER — Encounter (HOSPITAL_COMMUNITY): Admission: RE | Payer: Self-pay | Source: Ambulatory Visit

## 2016-08-01 ENCOUNTER — Ambulatory Visit (HOSPITAL_COMMUNITY)
Admission: RE | Admit: 2016-08-01 | Payer: Medicaid Other | Source: Ambulatory Visit | Admitting: Obstetrics & Gynecology

## 2016-08-01 SURGERY — LIGATION, FALLOPIAN TUBE, LAPAROSCOPIC
Anesthesia: General | Laterality: Bilateral

## 2016-08-06 ENCOUNTER — Encounter: Payer: Self-pay | Admitting: Orthopedic Surgery

## 2016-08-06 ENCOUNTER — Ambulatory Visit (INDEPENDENT_AMBULATORY_CARE_PROVIDER_SITE_OTHER): Payer: Medicaid Other | Admitting: Orthopedic Surgery

## 2016-08-06 DIAGNOSIS — Z4889 Encounter for other specified surgical aftercare: Secondary | ICD-10-CM

## 2016-08-06 DIAGNOSIS — Z9889 Other specified postprocedural states: Secondary | ICD-10-CM

## 2016-08-06 MED ORDER — TRAMADOL-ACETAMINOPHEN 37.5-325 MG PO TABS: 1.0000 | ORAL_TABLET | Freq: Four times a day (QID) | ORAL | 0 refills | Status: DC | PRN

## 2016-08-06 NOTE — Progress Notes (Signed)
Patient ID: Breanna Malone, female   DOB: 26-Feb-1980, 36 y.o.   MRN: [id number]  POSTOP VISIT   Chief Complaint  Patient presents with  . Follow-up    Recheck on right CTR, DOS 06-28-16.    Postop day #39 after right carpal tunnel release. She has resolved the matter of the swelling and symptomatic wound however, she still has weak cramp and would like to go to therapy  She should get occupational therapy through Medicaid 3 visits  She will do the rest home  Follow-up 6 weeks  Refilled her Ultracet medicine   Swelling in the right hand is gone down and is equal to the left she makes full range of motion but her grip strength is weak. Her incision healed is no erythema and there is no tenderness   Encounter Diagnoses  Name Primary?  Marland Kitchen Aftercare following surgery Yes  . History of carpal tunnel surgery of right wrist       10:46 AM Arther Abbott, MD 08/06/2016
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
## 2016-08-06 NOTE — Patient Instructions (Signed)
Call hospital outpatient for PT appointment

## 2016-08-07 ENCOUNTER — Telehealth (HOSPITAL_COMMUNITY): Payer: Self-pay

## 2016-08-07 ENCOUNTER — Ambulatory Visit (INDEPENDENT_AMBULATORY_CARE_PROVIDER_SITE_OTHER): Payer: Medicaid Other | Admitting: Cardiovascular Disease

## 2016-08-07 ENCOUNTER — Encounter: Payer: Self-pay | Admitting: Cardiovascular Disease

## 2016-08-07 VITALS — BP 114/74 | HR 102 | Ht 64.0 in | Wt 233.0 lb

## 2016-08-07 DIAGNOSIS — R079 Chest pain, unspecified: Secondary | ICD-10-CM | POA: Diagnosis not present

## 2016-08-07 DIAGNOSIS — R55 Syncope and collapse: Secondary | ICD-10-CM

## 2016-08-07 NOTE — Patient Instructions (Signed)
Medication Instructions:  Your physician recommends that you continue on your current medications as directed. Please refer to the Current Medication list given to you today.  Labwork: NONE  Testing/Procedures: Your physician has requested that you have an exercise tolerance test. For further information please visit [email or web address]. Please also follow instruction sheet, as given.  Your physician has requested that you have an echocardiogram. Echocardiography is a painless test that uses sound waves to create images of your heart. It provides your doctor with information about the size and shape of your heart and how well your heart's chambers and valves are working. This procedure takes approximately one hour. There are no restrictions for this procedure.    Follow-Up: Your physician recommends that you schedule a follow-up appointment in: AS NEEDED    Any Other Special Instructions Will Be Listed Below (If Applicable).     If you need a refill on your cardiac medications before your next appointment, please call your pharmacy. . 

## 2016-08-07 NOTE — Telephone Encounter (Signed)
Called Debbie she will put a note into Dr. Aline Brochure and get what we need to [place] before pt's apptment. NF 08/07/16 Faxed Referral request. NF 6/18/18Pt states Dr. Aline Brochure will send order she just left his office. NF 08/06/16

## 2016-08-07 NOTE — Addendum Note (Signed)
Addended by: Baldomero Lamy B on: 08/07/2016 04:43 PM   Modules accepted: Orders

## 2016-08-08 ENCOUNTER — Encounter (HOSPITAL_COMMUNITY): Payer: Self-pay

## 2016-08-08 ENCOUNTER — Ambulatory Visit (HOSPITAL_COMMUNITY): Payer: Medicaid Other | Attending: Orthopedic Surgery

## 2016-08-08 DIAGNOSIS — M25631 Stiffness of right wrist, not elsewhere classified: Secondary | ICD-10-CM | POA: Diagnosis present

## 2016-08-08 DIAGNOSIS — M25531 Pain in right wrist: Secondary | ICD-10-CM | POA: Insufficient documentation

## 2016-08-08 DIAGNOSIS — R29898 Other symptoms and signs involving the musculoskeletal system: Secondary | ICD-10-CM | POA: Diagnosis present

## 2016-08-08 NOTE — Patient Instructions (Signed)
Home Exercises Program Theraputty Exercises  Do the following exercises 2 times a day using your affected hand.  1. Roll putty into a ball.  2. Make into a pancake.  3. Roll putty into a roll.  4. Pinch along log with first finger and thumb.   5. Make into a ball.  6. Roll it back into a log.   7. Pinch using thumb and side of first finger.  8. Roll into a ball, then flatten into a pancake.  9. Using your fingers, make putty into a mountain.  10. Roll into a ball and squeeze 10-15 times.    Wrist Flexion- Complete 15-20 reps  Support your forearm on a table with your palm facing the ceiling.  Next, stepping on one end of the theraband, curl your wrist up against resistance.     Wrist Extension- Complete 15-20 reps  Support your forearm on a table with your palm facing the ground.  Next, stepping on one end of the theraband, curl your wrist up against resistance.      Scar Massage- Spend between 5-10 minutes  CIRCLES o Using two fingers make small circles over the length of the scar and to the skin around the scar.  ROLLING o Pinch a small amount of the scar tissue between your thumb and first two fingers. To roll the scar,walk your first two fingers forward and then slide your thumb forward to keep the "hill" in the scar.Do this along the length of the scar. o Use two fingers from one hand and two from the other. Pull your fingers on one hand while pushing the fingers of the other across the scar in a sawing motion. If the scar is located where you can only use one hand,pull then push your fingers across the length of the scar.

## 2016-08-08 NOTE — Therapy (Signed)
[facility], [place], [address] Phone: [phone]   Fax:  [phone]  Occupational Therapy Evaluation  Patient Details  Name: Breanna Malone MRN: [id number] Date of Birth: 08-Mar-1980 Referring Provider: Dr. Arther Abbott  Encounter Date: 08/08/2016      OT End of Session - 08/08/16 1145    Visit Number 1   Number of Visits 4   Date for OT Re-Evaluation 09/19/16   Authorization Type Medicaid- requesting 3 visits on 08/08/16   OT Start Time 1035   OT Stop Time 1120   OT Time Calculation (min) 45 min   Activity Tolerance Patient tolerated treatment well   Behavior During Therapy [place] for tasks assessed/performed      Past Medical History:  Diagnosis Date  . Abnormal Pap smear   . Arthritis   . Asthma   . Asthma   . Bipolar 1 disorder ([place])   . Bronchitis   . Carpal tunnel syndrome   . Chest pain   . Elevated cholesterol   . High cholesterol   . Hyperlipidemia   . Hyperlipoproteinemia   . Post traumatic stress disorder   . Right ovarian cyst 10/31/2012   Complex right ovarian cyst seen 8/18 in ER need f/u US  . Tendonitis     Past Surgical History:  Procedure Laterality Date  . CARPAL TUNNEL RELEASE Right 06/28/2016   Procedure: RIGHT CARPAL TUNNEL RELEASE;  Surgeon: Carole Civil, MD;  Location: [facility];  Service: Orthopedics;  Laterality: Right;  . HERNIA REPAIR     umbilical    There were no vitals filed for this visit.      Subjective Assessment - 08/08/16 1137    Subjective  S: I'm having some pain in my wrist and shoulder.   Pertinent History Pt is a 36 year old female who is s/p right carpal tunnel release on 06/28/16. Dr. Aline Brochure has referred pt to occupational therapy for evaluation and treatment.   Patient Stated Goals Pt states that she would like to be able to work more with her hand, to be able to cook and not drop things, as well as hold her daughter   Currently in Pain? Yes    Pain Score 5    Pain Location Hand   Pain Orientation Right   Pain Descriptors / Indicators Sore;Throbbing;Constant   Pain Type Acute pain   Pain Radiating Towards Shoulder   Pain Onset More than a month ago   Pain Frequency Constant   Aggravating Factors  Too much use   Pain Relieving Factors N/A   Effect of Pain on Daily Activities Minimum effect   Multiple Pain Sites Yes   Pain Score 6   Pain Location Shoulder   Pain Orientation Right   Pain Descriptors / Indicators Sharp   Pain Type Acute pain   Pain Radiating Towards N/A   Pain Onset 1 to 4 weeks ago   Pain Frequency Intermittent   Aggravating Factors  N/A   Pain Relieving Factors N/A   Effect of Pain on Daily Activities minimum           [facility] OT Assessment - 08/08/16 1042      Assessment   Diagnosis S/P right carpal tunnel release   Referring Provider Dr. Arther Abbott   Onset Date 06/28/16   Assessment Follow up appointment on 09/14/16   Prior Therapy none     Precautions   Precautions None     Restrictions  Weight Bearing Restrictions No     Balance Screen   Has the patient fallen in the past 6 months No     Home  Environment   Family/patient expects to be discharged to: Private residence   Lives With Daughter  25 year old     Prior Function   Level of Independence Independent   Vocation Unemployed   Vocation Requirements Looking into packaging work   Leisure hanging out with daughter, taking her to skating rink      ADL   ADL comments Pt reports having difficulty with cooking; sich as lifting heavy pots and pans and fixing her hair as well as her daughter's hair.     Mobility   Mobility Status Independent     Written Expression   Dominant Hand Left     Vision - History   Baseline Vision Wears glasses all the time  Doesn't wear them     Cognition   Overall Cognitive Status Within Functional Limits for tasks assessed     Observation/Other Assessments   Observations Scar length on  wrist-4cm   Skin Integrity healed     Sensation   Light Touch Appears Intact     Coordination   Gross Motor Movements are Fluid and Coordinated Yes   Fine Motor Movements are Fluid and Coordinated Yes   9 Hole Peg Test Left;Right   Right 9 Hole Peg Test 24.87 seconds   Left 9 Hole Peg Test 20.96 seconds     ROM / Strength   AROM / PROM / Strength AROM;PROM;Strength     Palpation   Palpation comment Min amount of fascial restrictions along the scar on the right wrist     AROM   Overall AROM  Deficits   Overall AROM Comments Assessed seated.    AROM Assessment Site Wrist   Right/Left Wrist Right   Right Wrist Extension 60 Degrees   Right Wrist Flexion 66 Degrees     PROM   Overall PROM  Within functional limits for tasks performed     Strength   Overall Strength Deficits   Strength Assessment Site Wrist   Right/Left Wrist Right   Right Wrist Flexion 5/5   Right Wrist Extension 4-/5     Hand Function   Right Hand Gross Grasp Impaired   Right Hand Grip (lbs) 23   Right Hand Lateral Pinch 17 lbs   Right Hand 3 Point Pinch 10 lbs   Left Hand Gross Grasp Functional   Left Hand Grip (lbs) 80   Left Hand Lateral Pinch 23 lbs   Left 3 point pinch 16 lbs                         OT Education - 08/08/16 1144    Education provided Yes   Education Details Provided pt with HEP for grip and pinch strengthening, wrist strengthening and scar massage; educated pt on need for therapy    Person(s) Educated Patient   Methods Explanation;Demonstration;Verbal cues;Handout   Comprehension Verbalized understanding;Returned demonstration          OT Short Term Goals - 08/08/16 1151      OT SHORT TERM GOAL #1   Title Patient will be educated on a HEP for improved function of RUE during daily activities.   Time 4   Period Weeks   Status New     OT SHORT TERM GOAL #2   Title Patient will improve right wrist A/ROM  by 5 degrees for improved ability to fix her and  her daughter's hair.   Time 4   Period Weeks   Status New     OT SHORT TERM GOAL #3   Title Patient will improve right wrist strength to 5/5 for greater ability to lift items while cooking with less occurences of dropping pots/pans.   Time 4   Period Weeks   Status New     OT SHORT TERM GOAL #4   Title Patient will improve her right grip strength by 15# and pinch by 2# for greater ability to hold onto pots and pans while cooking with greater safety.   Time 4   Period Weeks   Status New     OT SHORT TERM GOAL #5   Title Patient will decrease fascial and scar restrictions to trace or none in her right hand and wrist.   Time 4   Period Weeks   Status New                  Plan - 08/08/16 1156    Clinical Impression Statement A: Patient is a 36 year old female s/p right carpal tunnel release. Patient underwent surgery on 06/28/16 causing increased fascial restrictions and decreased strength and ROM resulting in difficulty completing ADL task with RUE.   Occupational Profile and client history currently impacting functional performance Motivated to return to prior level of function to take care of daughter   Occupational performance deficits (Please refer to evaluation for details): ADL's;IADL's;Rest and Sleep;Work;Leisure   Rehab Potential Good   Current Impairments/barriers affecting progress: Right shoulder pain   OT Treatment/Interventions Self-care/ADL training;Therapeutic exercise;Patient/family education;Ultrasound;Manual Therapy;Splinting;Iontophoresis;Cryotherapy;DME and/or AE instruction;Therapeutic activities;Electrical Stimulation;Scar mobilization;Moist Heat;Passive range of motion   Plan P: Pt will benefit from skilled OT services to decrease scar and fascial restrictions as well as pain and improve wrist range of motion and strength needed in order to return to prior level of function with her ADLs and leisure activities. Treatment Plan: myofascial release, manual  stretching, P/ROM, A/ROM, hand and wrist strengthening   Clinical Decision Making Limited treatment options, no task modification necessary   OT Home Exercise Plan 08/08/16: pinch and grip strengthening, scar massage, wrist strengthening   Consulted and Agree with Plan of Care Patient      Patient will benefit from skilled therapeutic intervention in order to improve the following deficits and impairments:  Decreased scar mobility, Impaired perceived functional ability, Decreased strength, Impaired flexibility, Decreased mobility, Decreased range of motion, Pain, Decreased coordination, Increased fascial restricitons, Impaired UE functional use  Visit Diagnosis: Other symptoms and signs involving the musculoskeletal system  Pain in right wrist  Stiffness of right wrist, not elsewhere classified    Problem List Patient Active Problem List   Diagnosis Date Noted  . Carpal tunnel syndrome of right wrist   . Condyloma acuminata 09/08/2014  . Condyloma acuminatum in female 02/24/2014  . URI (upper respiratory infection) 02/18/2013  . Abnormal Pap smear of cervix 11/24/2012  . Asthma 11/24/2012  . Right ovarian cyst 10/31/2012  . Bipolar 1 disorder ([place]) 10/31/2012    Luther Hearing, OT Student [phone] 08/08/2016, 12:41 PM  [facility] [address] [place], [place], [address] Phone: [phone]   Fax:  [phone]  Name: Breanna Malone MRN: [id number] Date of Birth: Nov 30, 1980     This qualified practitioner was present in the room guiding the student in service delivery. Therapy student was participating in the provision of  services, and the practitioner was not engaged in treating another patient or doing other tasks at the same time.  Ailene Ravel, OTR/L,CBIS  [phone]

## 2016-08-10 ENCOUNTER — Ambulatory Visit (HOSPITAL_COMMUNITY): Admission: RE | Admit: 2016-08-10 | Payer: Medicaid Other | Source: Ambulatory Visit

## 2016-08-10 ENCOUNTER — Ambulatory Visit (HOSPITAL_COMMUNITY): Payer: Medicaid Other | Attending: Cardiovascular Disease

## 2016-08-17 ENCOUNTER — Telehealth (HOSPITAL_COMMUNITY): Payer: Self-pay | Admitting: Occupational Therapy

## 2016-08-17 ENCOUNTER — Ambulatory Visit (HOSPITAL_COMMUNITY): Payer: Medicaid Other | Admitting: Occupational Therapy

## 2016-08-17 NOTE — Telephone Encounter (Signed)
Called and left message for pt regarding no-show for 6/29 appt. Reminded of 7/2 appt.   [name], OTR/L  [phone] 08/17/2016

## 2016-08-20 ENCOUNTER — Encounter: Payer: Self-pay | Admitting: *Deleted

## 2016-08-20 ENCOUNTER — Ambulatory Visit (INDEPENDENT_AMBULATORY_CARE_PROVIDER_SITE_OTHER): Payer: Medicaid Other | Admitting: *Deleted

## 2016-08-20 ENCOUNTER — Encounter (HOSPITAL_COMMUNITY): Payer: Self-pay | Admitting: Occupational Therapy

## 2016-08-20 ENCOUNTER — Ambulatory Visit (HOSPITAL_COMMUNITY): Payer: Medicaid Other | Attending: Orthopedic Surgery | Admitting: Occupational Therapy

## 2016-08-20 DIAGNOSIS — M25531 Pain in right wrist: Secondary | ICD-10-CM | POA: Diagnosis present

## 2016-08-20 DIAGNOSIS — R29898 Other symptoms and signs involving the musculoskeletal system: Secondary | ICD-10-CM | POA: Insufficient documentation

## 2016-08-20 DIAGNOSIS — Z3202 Encounter for pregnancy test, result negative: Secondary | ICD-10-CM

## 2016-08-20 DIAGNOSIS — Z3042 Encounter for surveillance of injectable contraceptive: Secondary | ICD-10-CM

## 2016-08-20 DIAGNOSIS — M25631 Stiffness of right wrist, not elsewhere classified: Secondary | ICD-10-CM | POA: Insufficient documentation

## 2016-08-20 LAB — POCT URINE PREGNANCY: Preg Test, Ur: NEGATIVE

## 2016-08-20 MED ORDER — MEDROXYPROGESTERONE ACETATE 150 MG/ML IM SUSP
150.0000 mg | Freq: Once | INTRAMUSCULAR | Status: AC
  Administered 2016-08-20: 150 mg via INTRAMUSCULAR

## 2016-08-20 NOTE — Progress Notes (Signed)
Depo Provera 150mg IM given in right deltoid with no complications. Pt to return in 12 weeks for next injection.  

## 2016-08-20 NOTE — Therapy (Signed)
[place], [place], [address] Phone: [phone]   Fax:  [phone]  Occupational Therapy Treatment  Patient Details  Name: Breanna Malone MRN: [id number] Date of Birth: 15-Aug-1980 Referring Provider: Dr. Arther Abbott  Encounter Date: 08/20/2016      OT End of Session - 08/20/16 1132    Visit Number 2   Number of Visits 4   Date for OT Re-Evaluation 09/19/16   Authorization Type Medicaid- 3 visits approved 6/22-8/2   OT Start Time 1032   OT Stop Time 1114   OT Time Calculation (min) 42 min   Activity Tolerance Patient tolerated treatment well   Behavior During Therapy [facility] for tasks assessed/performed      Past Medical History:  Diagnosis Date  . Abnormal Pap smear   . Arthritis   . Asthma   . Asthma   . Bipolar 1 disorder ([place])   . Bronchitis   . Carpal tunnel syndrome   . Chest pain   . Elevated cholesterol   . High cholesterol   . Hyperlipidemia   . Hyperlipoproteinemia   . Post traumatic stress disorder   . Right ovarian cyst 10/31/2012   Complex right ovarian cyst seen 8/18 in ER need f/u US  . Tendonitis     Past Surgical History:  Procedure Laterality Date  . CARPAL TUNNEL RELEASE Right 06/28/2016   Procedure: RIGHT CARPAL TUNNEL RELEASE;  Surgeon: Carole Civil, MD;  Location: [facility];  Service: Orthopedics;  Laterality: Right;  . HERNIA REPAIR     umbilical    There were no vitals filed for this visit.      Subjective Assessment - 08/20/16 1030    Subjective  I'm sore right around the incision area mostly.    Currently in Pain? No/denies            [facility] OT Assessment - 08/20/16 1030      Assessment   Diagnosis S/P right carpal tunnel release     Precautions   Precautions None                  OT Treatments/Exercises (OP) - 08/20/16 1035      Exercises   Exercises Hand;Wrist     Wrist Exercises   Wrist Flexion AROM;15 reps   Wrist Extension  AROM;15 reps   Wrist Radial Deviation AROM;15 reps   Wrist Ulnar Deviation AROM;15 reps     Additional Wrist Exercises   Sponges 15, 24   Theraputty Flatten;Pinch   Theraputty - Flatten red   Theraputty - Pinch red- lateral and 3 point   Hand Gripper with Large Beads all beads gripper at 25#   Hand Gripper with Medium Beads all beads gripper at 25#   Hand Gripper with Small Beads all beads gripper at 25#     Hand Exercises   Other Hand Exercises Eggsercizer: 4 colors, squeezing 10x each; pain towards end of exercise     Fine Motor Coordination   Other Fine Motor Exercises Pt stacked sponges on 2 trials, using red clothespin and 3 point pinch. Trial 1: 6 sponges stacked, Trial 2: 2 towers of 5 stacked. Pt then used green clothespin in lateral pinch to grasp 20 sponges and place into bucket     Manual Therapy   Manual Therapy Myofascial release   Manual therapy comments completed separately from therapeutic exercise   Myofascial Release Myofascial release to right carpal tunnel, wrist, dorsal, and volar  forearm to decrease pain and fascial restrictions and increase joint range of motion                  OT Short Term Goals - 08/20/16 1135      OT SHORT TERM GOAL #1   Title Patient will be educated on a HEP for improved function of RUE during daily activities.   Time 4   Period Weeks   Status On-going     OT SHORT TERM GOAL #2   Title Patient will improve right wrist A/ROM by 5 degrees for improved ability to fix her and her daughter's hair.   Time 4   Period Weeks   Status On-going     OT SHORT TERM GOAL #3   Title Patient will improve right wrist strength to 5/5 for greater ability to lift items while cooking with less occurences of dropping pots/pans.   Time 4   Period Weeks   Status On-going     OT SHORT TERM GOAL #4   Title Patient will improve her right grip strength by 15# and pinch by 2# for greater ability to hold onto pots and pans while cooking with  greater safety.   Time 4   Period Weeks   Status On-going     OT SHORT TERM GOAL #5   Title Patient will decrease fascial and scar restrictions to trace or none in her right hand and wrist.   Time 4   Period Weeks   Status On-going                  Plan - 08/20/16 1133    Clinical Impression Statement A: Initiated myofascial release, wrist A/ROM, grip and pinch strengthening this session. Pt reports discomfort around incision area, able to complete tasks with occasional rest breaks. Verbal cuing for technique with exercises. At times pt reports pain in shoulder going to elbow and hand, reports this has been ongoing for a while now.    Plan P: Continue with wrist, grip, and pinch strengthening, add 1# weight to wrist exercises.    OT Home Exercise Plan 08/08/16: pinch and grip strengthening, scar massage, wrist strengthening   Consulted and Agree with Plan of Care Patient      Patient will benefit from skilled therapeutic intervention in order to improve the following deficits and impairments:  Decreased scar mobility, Impaired perceived functional ability, Decreased strength, Impaired flexibility, Decreased mobility, Decreased range of motion, Pain, Decreased coordination, Increased fascial restricitons, Impaired UE functional use  Visit Diagnosis: Other symptoms and signs involving the musculoskeletal system  Pain in right wrist  Stiffness of right wrist, not elsewhere classified    Problem List Patient Active Problem List   Diagnosis Date Noted  . Carpal tunnel syndrome of right wrist   . Condyloma acuminata 09/08/2014  . Condyloma acuminatum in female 02/24/2014  . URI (upper respiratory infection) 02/18/2013  . Abnormal Pap smear of cervix 11/24/2012  . Asthma 11/24/2012  . Right ovarian cyst 10/31/2012  . Bipolar 1 disorder ([place]) 10/31/2012   Guadelupe Malone, OTR/L  [phone] 08/20/2016, 11:36 AM  [facility] [address] [place], [place], [address] Phone: [phone]   Fax:  [phone]  Name: Breanna Malone MRN: [id number] Date of Birth: Feb 05, 1981

## 2016-08-29 ENCOUNTER — Encounter (HOSPITAL_COMMUNITY): Payer: Medicaid Other

## 2016-09-04 ENCOUNTER — Ambulatory Visit (HOSPITAL_COMMUNITY)
Admission: RE | Admit: 2016-09-04 | Discharge: 2016-09-04 | Disposition: A | Discharge status: Home / Self Care | Payer: Medicaid Other | Source: Ambulatory Visit | Attending: Cardiovascular Disease | Admitting: Cardiovascular Disease

## 2016-09-04 ENCOUNTER — Other Ambulatory Visit: Payer: Self-pay

## 2016-09-04 DIAGNOSIS — R079 Chest pain, unspecified: Secondary | ICD-10-CM

## 2016-09-04 DIAGNOSIS — R55 Syncope and collapse: Secondary | ICD-10-CM | POA: Diagnosis not present

## 2016-09-04 LAB — ECHOCARDIOGRAM COMPLETE
CHL CUP MV DEC (S): 335
CHL CUP STROKE VOLUME: 41 mL
EERAT: 6.55
EWDT: 335 ms
FS: 30 % (ref 28–44)
IVS/LV PW RATIO, ED: 0.9
LA diam index: 1.29 cm/m2
LA vol A4C: 34 ml
LASIZE: 27 mm
LAVOL: 39.5 mL
LAVOLIN: 18.9 mL/m2
LDCA: 3.14 cm2
LEFT ATRIUM END SYS DIAM: 27 mm
LV E/e'average: 6.55
LV PW d: 9.84 mm — AB (ref 0.6–1.1)
LV TDI E'LATERAL: 8.59
LV TDI E'MEDIAL: 10
LV dias vol index: 42 mL/m2
LV dias vol: 88 mL (ref 46–106)
LV sys vol index: 22 mL/m2
LV sys vol: 47 mL — AB (ref 14–42)
LVEEMED: 6.55
LVELAT: 8.59 cm/s
LVOTD: 20 mm
Lateral S' vel: 14.1 cm/s
MVPKAVEL: 45.1 m/s
MVPKEVEL: 56.3 m/s
Simpson's disk: 47
TAPSE: 18.7 mm

## 2016-09-04 LAB — EXERCISE TOLERANCE TEST
CHL RATE OF PERCEIVED EXERTION: 15
CSEPED: 2 min
CSEPEDS: 12 s
CSEPEW: 4.6 METS
CSEPHR: 90 %
MPHR: 164 {beats}/min
Peak HR: 164 {beats}/min
Rest HR: 82 {beats}/min

## 2016-09-04 NOTE — Progress Notes (Signed)
*  PRELIMINARY RESULTS* Echocardiogram 2D Echocardiogram has been performed.  Leavy Cella 09/04/2016, 9:20 AM

## 2016-09-05 ENCOUNTER — Ambulatory Visit (HOSPITAL_COMMUNITY): Payer: Medicaid Other | Admitting: Occupational Therapy

## 2016-09-05 ENCOUNTER — Telehealth (HOSPITAL_COMMUNITY): Payer: Self-pay | Admitting: Occupational Therapy

## 2016-09-05 NOTE — Telephone Encounter (Signed)
Called pt re: no-show for OT appointment. Left message informing pt that today was the last appointment scheduled and she could call to schedule additional appointments.    [name], OTR/L  [phone] 09/05/2016

## 2016-09-07 ENCOUNTER — Ambulatory Visit (HOSPITAL_COMMUNITY): Payer: Medicaid Other | Admitting: Occupational Therapy

## 2016-09-07 ENCOUNTER — Encounter (HOSPITAL_COMMUNITY): Payer: Self-pay | Admitting: Occupational Therapy

## 2016-09-07 DIAGNOSIS — M25531 Pain in right wrist: Secondary | ICD-10-CM

## 2016-09-07 DIAGNOSIS — M25631 Stiffness of right wrist, not elsewhere classified: Secondary | ICD-10-CM

## 2016-09-07 DIAGNOSIS — R29898 Other symptoms and signs involving the musculoskeletal system: Secondary | ICD-10-CM

## 2016-09-07 NOTE — Therapy (Signed)
[facility] [address] [place], [place], [address] Phone: [phone]   Fax:  [phone]  Occupational Therapy Treatment  Patient Details  Name: Breanna Malone MRN: [id number] Date of Birth: 1980/11/13 Referring Provider: Dr. Arther Abbott  Encounter Date: 09/07/2016      OT End of Session - 09/07/16 1533    Visit Number 3   Number of Visits 4   Date for OT Re-Evaluation 09/19/16   Authorization Type Medicaid- 3 visits approved 6/22-8/2   Authorization - Visit Number 2   Authorization - Number of Visits 3   OT Start Time 1517   OT Stop Time 1514   OT Time Calculation (min) 41 min   Activity Tolerance Patient tolerated treatment well   Behavior During Therapy [facility] for tasks assessed/performed      Past Medical History:  Diagnosis Date  . Abnormal Pap smear   . Arthritis   . Asthma   . Asthma   . Bipolar 1 disorder ([place])   . Bronchitis   . Carpal tunnel syndrome   . Chest pain   . Elevated cholesterol   . High cholesterol   . Hyperlipidemia   . Hyperlipoproteinemia   . Post traumatic stress disorder   . Right ovarian cyst 10/31/2012   Complex right ovarian cyst seen 8/18 in ER need f/u US  . Tendonitis     Past Surgical History:  Procedure Laterality Date  . CARPAL TUNNEL RELEASE Right 06/28/2016   Procedure: RIGHT CARPAL TUNNEL RELEASE;  Surgeon: Carole Civil, MD;  Location: [facility];  Service: Orthopedics;  Laterality: Right;  . HERNIA REPAIR     umbilical    There were no vitals filed for this visit.      Subjective Assessment - 09/07/16 1436    Subjective  S: I'm having sharp pains in my hand constantly.    Currently in Pain? Yes   Pain Score 7    Pain Location Hand   Pain Orientation Right   Pain Descriptors / Indicators Aching;Sore   Pain Type Acute pain   Pain Radiating Towards shoulder   Pain Onset More than a month ago   Pain Frequency Constant   Aggravating Factors  too much use   Pain  Relieving Factors n/a   Effect of Pain on Daily Activities minimal effect   Multiple Pain Sites No            [facility] OT Assessment - 09/07/16 1435      Assessment   Diagnosis S/P right carpal tunnel release     Precautions   Precautions None                  OT Treatments/Exercises (OP) - 09/07/16 1438      Exercises   Exercises Hand;Wrist     Wrist Exercises   Wrist Flexion Strengthening;15 reps   Bar Weights/Barbell (Wrist Flexion) 1 lb   Wrist Extension Strengthening;15 reps   Bar Weights/Barbell (Wrist Extension) 1 lb   Wrist Radial Deviation Strengthening;15 reps   Bar Weights/Barbell (Radial Deviation) 1 lb   Wrist Ulnar Deviation Strengthening;15 reps   Bar Weights/Barbell (Ulnar Deviation) 1 lb     Fine Motor Coordination   Tendon Glides 10X     Additional Wrist Exercises   Hand Gripper with Large Beads all beads gripper at 29#   Hand Gripper with Medium Beads all beads gripper at 29#   Hand Gripper with Small Beads all beads gripper  at 29#     Hand Exercises   Other Hand Exercises Pt used green clothespin and lateral pinch to grasp and place 25 sponges across table, pt then used 3 point pinch to place 25 sponges into bucket. Min difficulty     Manual Therapy   Manual Therapy Myofascial release   Manual therapy comments completed separately from therapeutic exercise   Myofascial Release Myofascial release to right carpal tunnel, wrist, dorsal, and volar forearm to decrease pain and fascial restrictions and increase joint range of motion                OT Education - 09/07/16 1507    Education provided Yes   Education Details tendon gliding exercises   Person(s) Educated Patient   Methods Explanation;Demonstration;Handout   Comprehension Verbalized understanding;Returned demonstration          OT Short Term Goals - 08/20/16 1135      OT SHORT TERM GOAL #1   Title Patient will be educated on a HEP for improved function of RUE  during daily activities.   Time 4   Period Weeks   Status On-going     OT SHORT TERM GOAL #2   Title Patient will improve right wrist A/ROM by 5 degrees for improved ability to fix her and her daughter's hair.   Time 4   Period Weeks   Status On-going     OT SHORT TERM GOAL #3   Title Patient will improve right wrist strength to 5/5 for greater ability to lift items while cooking with less occurences of dropping pots/pans.   Time 4   Period Weeks   Status On-going     OT SHORT TERM GOAL #4   Title Patient will improve her right grip strength by 15# and pinch by 2# for greater ability to hold onto pots and pans while cooking with greater safety.   Time 4   Period Weeks   Status On-going     OT SHORT TERM GOAL #5   Title Patient will decrease fascial and scar restrictions to trace or none in her right hand and wrist.   Time 4   Period Weeks   Status On-going                  Plan - 09/07/16 1533    Clinical Impression Statement A: Pt reports continued sharp, electrical-like pains around thumb, carpal tunnel, and along volar forearm regions. Pt only demonstrates tenderness around incision area during manual therapy. Pt able to tolerate 1# weight during wrist exercises with no difficulty, increased gripper strength and completed all pinch strengthening with green clothespin. Added tendon glides to HEP.    Plan P: Follow up on HEP, reassessment-last Medicaid approved visit   OT Home Exercise Plan 08/08/16: pinch and grip strengthening, scar massage, wrist strengthening   Consulted and Agree with Plan of Care Patient      Patient will benefit from skilled therapeutic intervention in order to improve the following deficits and impairments:  Decreased scar mobility, Impaired perceived functional ability, Decreased strength, Impaired flexibility, Decreased mobility, Decreased range of motion, Pain, Decreased coordination, Increased fascial restricitons, Impaired UE functional  use  Visit Diagnosis: Other symptoms and signs involving the musculoskeletal system  Pain in right wrist  Stiffness of right wrist, not elsewhere classified    Problem List Patient Active Problem List   Diagnosis Date Noted  . Carpal tunnel syndrome of right wrist   . Condyloma acuminata 09/08/2014  .  Condyloma acuminatum in female 02/24/2014  . URI (upper respiratory infection) 02/18/2013  . Abnormal Pap smear of cervix 11/24/2012  . Asthma 11/24/2012  . Right ovarian cyst 10/31/2012  . Bipolar 1 disorder ([place]) 10/31/2012   [name], OTR/L  [phone] 09/07/2016, 3:36 PM  [facility] [address] [place], [place], [address] Phone: [phone]   Fax:  [phone]  Name: Breanna Malone MRN: [id number] Date of Birth: March 02, 1980

## 2016-09-07 NOTE — Patient Instructions (Signed)
Tendon Gliding Exercises: Complete 10X, hold for 3-5 seconds, 1-2x per day  1) Straight: begin with wrist in extended position and fingers straight    2) Hook: Bend your fingers making them look like a hook while keeping your thumb straight.      3) Fist: Make your hand into a fist.      4) Straight Fist: Bend your fingers straight down into a straight fist.      5) Table Top: Straighten your fingers straight out making them look like a table top.       

## 2016-09-12 ENCOUNTER — Ambulatory Visit (HOSPITAL_COMMUNITY): Payer: Medicaid Other

## 2016-09-12 ENCOUNTER — Telehealth (HOSPITAL_COMMUNITY): Payer: Self-pay

## 2016-09-12 NOTE — Telephone Encounter (Signed)
7/25: called patient regarding no show. Patient reports that she forgot about appointment. Rescheduled missed appointment for 09/13/16.  Ailene Ravel, OTR/L,CBIS  [phone]

## 2016-09-13 ENCOUNTER — Encounter (HOSPITAL_COMMUNITY): Payer: Self-pay

## 2016-09-13 ENCOUNTER — Ambulatory Visit (HOSPITAL_COMMUNITY): Payer: Medicaid Other

## 2016-09-13 DIAGNOSIS — M25531 Pain in right wrist: Secondary | ICD-10-CM

## 2016-09-13 DIAGNOSIS — M25631 Stiffness of right wrist, not elsewhere classified: Secondary | ICD-10-CM

## 2016-09-13 DIAGNOSIS — R29898 Other symptoms and signs involving the musculoskeletal system: Secondary | ICD-10-CM

## 2016-09-13 NOTE — Patient Instructions (Signed)
Pain and Tenderness after Carpal Tunnel Surgery Paresthesias and scar tenderness are common in the subacute recovery phase following carpal tunnel release. A flare or aggravation of symptoms is common in the period of two to six weeks after surgery. Several different situations may contribute, including the following, listed roughly in most-to-least common:  . Normal scar tenderness with anxiety / awareness. . Normal scar adhesions to the perineural tissues. This may result in a sudden, brief electrical paresthesia, typically shooting from the palm out the middle finger tip. It may occur while reaching, gripping, or at rest. It may be alarming, but does not necessarily mean that there is a technical problem with the surgery or with the healing process. Adhesions by themselves would not explain constant pain. Pillar pain (tenderness adjacent to the actual ligament release, where the prominences of the trapezial ridge and the hook of the hamate are closest to the skin. The transverse retinacular ligament, divided during carpal tunnel release, attaches to these structures, and the inflammatory reaction of normal wound healing is most obvious at these points, often more than the central area of the actual release. . Aggravation of preexisting asymptomatic basal joint, pisotriquetral or triquetrohamate arthritis due to altered isometric stresses on these joints. . Reinnervation hypersensitivity - most often occurs if there was constant tingling, numbness or altered sensibility before surgery. . Reflex sympathetic dystrophy. . Coexisting neruritis from cervical radiculopathy, pronator syndrome, diabetic or other peripheral neuropathy. . Direct nerve irritation of one of the palmar cutaneous sensory branches to the palm or of the median nerve itself.

## 2016-09-13 NOTE — Therapy (Signed)
[facility] [address] [place], [place], [address] Phone: [phone]   Fax:  [phone]  Occupational Therapy Treatment  Patient Details  Name: Breanna Malone MRN: [id number] Date of Birth: 1980-09-09 Referring Provider: Dr. Arther Abbott  Encounter Date: 09/13/2016      OT End of Session - 09/13/16 1249    Visit Number 4   Number of Visits 4   Authorization Type Medicaid- 3 visits approved 6/22-8/2   Authorization - Visit Number 3   Authorization - Number of Visits 3   OT Start Time 1129  pt arrived late   OT Stop Time 1158   OT Time Calculation (min) 29 min   Activity Tolerance Patient tolerated treatment well   Behavior During Therapy [facility] for tasks assessed/performed      Past Medical History:  Diagnosis Date  . Abnormal Pap smear   . Arthritis   . Asthma   . Asthma   . Bipolar 1 disorder ([place])   . Bronchitis   . Carpal tunnel syndrome   . Chest pain   . Elevated cholesterol   . High cholesterol   . Hyperlipidemia   . Hyperlipoproteinemia   . Post traumatic stress disorder   . Right ovarian cyst 10/31/2012   Complex right ovarian cyst seen 8/18 in ER need f/u US  . Tendonitis     Past Surgical History:  Procedure Laterality Date  . CARPAL TUNNEL RELEASE Right 06/28/2016   Procedure: RIGHT CARPAL TUNNEL RELEASE;  Surgeon: Carole Civil, MD;  Location: [facility];  Service: Orthopedics;  Laterality: Right;  . HERNIA REPAIR     umbilical    There were no vitals filed for this visit.      Subjective Assessment - 09/13/16 1143    Subjective  S: I have an occassional shooting pain in my wrist but generally no pain.   Currently in Pain? No/denies            [facility] OT Assessment - 09/13/16 1129      Assessment   Diagnosis S/P right carpal tunnel release     Precautions   Precautions None     ROM / Strength   AROM / PROM / Strength AROM;Strength     Palpation   Palpation comment Trace amount  of fascial restrictions along the scar on the right wrist     AROM   Overall AROM  Within functional limits for tasks performed   AROM Assessment Site Wrist   Right/Left Wrist Right   Right Wrist Extension 67 Degrees  previous: 60   Right Wrist Flexion 92 Degrees  previous: 66     Strength   Overall Strength Within functional limits for tasks performed   Strength Assessment Site Wrist   Right/Left Wrist Right   Right Wrist Flexion 5/5  same as previous   Right Wrist Extension 5/5  previous: 4-/5     Hand Function   Right Hand Grip (lbs) 47  previous: 23    Right Hand Lateral Pinch 19 lbs  previous: 17   Right Hand 3 Point Pinch 16 lbs  previous: 10             OT Treatments/Exercises (OP) - 09/13/16 1146      Manual Therapy   Manual Therapy Myofascial release   Manual therapy comments completed separately from therapeutic exercise   Myofascial Release Myofascial release to right carpal tunnel, wrist, dorsal, and volar forearm to decrease pain and  fascial restrictions and increase joint range of motion            OT Education - 09/13/16 1147    Education provided Yes   Education Details educated pt on pain and tenderness after carpal tunnel surgery and encouraged pt to keep doing scar massage; educated pt on D/C from therapy; educated pt on Medicaid guidelines   Person(s) Educated Patient   Methods Explanation;Demonstration;Verbal cues;Handout   Comprehension Verbalized understanding          OT Short Term Goals - 09/13/16 1138      OT SHORT TERM GOAL #1   Title Patient will be educated on a HEP for improved function of RUE during daily activities.   Time 4   Period Weeks   Status Achieved     OT SHORT TERM GOAL #2   Title Patient will improve right wrist A/ROM by 5 degrees for improved ability to fix her and her daughter's hair.   Time 4   Period Weeks   Status Achieved     OT SHORT TERM GOAL #3   Title Patient will improve right wrist  strength to 5/5 for greater ability to lift items while cooking with less occurences of dropping pots/pans.   Time 4   Period Weeks   Status Achieved     OT SHORT TERM GOAL #4   Title Patient will improve her right grip strength by 15# and pinch by 2# for greater ability to hold onto pots and pans while cooking with greater safety.   Time 4   Period Weeks   Status Achieved     OT SHORT TERM GOAL #5   Title Patient will decrease fascial and scar restrictions to trace or none in her right hand and wrist.   Time 4   Period Weeks   Status Achieved            Plan - 09/13/16 1150    Clinical Impression Statement A: Pt arrived late to today's session. Session focused on reassessment. Pt achieved all short term goals and showed significant increase in wrist ROM and strength as well as decreased pain and fascial restrictions. Pt reports no difficulty completing any daily activities. Pt is to be D/C after today's session.    Plan P: D/C pt.      Patient will benefit from skilled therapeutic intervention in order to improve the following deficits and impairments:  Decreased scar mobility, Impaired perceived functional ability, Decreased strength, Impaired flexibility, Decreased mobility, Decreased range of motion, Pain, Decreased coordination, Increased fascial restricitons, Impaired UE functional use  Visit Diagnosis: Other symptoms and signs involving the musculoskeletal system  Pain in right wrist  Stiffness of right wrist, not elsewhere classified    Problem List Patient Active Problem List   Diagnosis Date Noted  . Carpal tunnel syndrome of right wrist   . Condyloma acuminata 09/08/2014  . Condyloma acuminatum in female 02/24/2014  . URI (upper respiratory infection) 02/18/2013  . Abnormal Pap smear of cervix 11/24/2012  . Asthma 11/24/2012  . Right ovarian cyst 10/31/2012  . Bipolar 1 disorder ([place]) 10/31/2012    Luther Hearing, OT Student [phone] 09/13/2016,  12:55 PM  [facility] [address] [place], [place], [address] Phone: [phone]   Fax:  [phone]  Name: Breanna Malone MRN: [id number] Date of Birth: [birth year]   OCCUPATIONAL THERAPY DISCHARGE SUMMARY  Visits from Start of Care: 4  Current functional level related to goals /  functional outcomes: See above   Remaining deficits: See above   Education / Equipment: See above Plan: Patient agrees to discharge.  Patient goals were met. Patient is being discharged due to meeting the stated rehab goals.  ?????         Note reviewed by clinical instructor and accurately reflects treatment session.    Ailene Ravel, OTR/L,CBIS  [phone]

## 2016-09-14 ENCOUNTER — Ambulatory Visit (INDEPENDENT_AMBULATORY_CARE_PROVIDER_SITE_OTHER): Payer: Medicaid Other | Admitting: Orthopedic Surgery

## 2016-09-14 DIAGNOSIS — Z4889 Encounter for other specified surgical aftercare: Secondary | ICD-10-CM

## 2016-09-14 DIAGNOSIS — M792 Neuralgia and neuritis, unspecified: Secondary | ICD-10-CM

## 2016-09-14 DIAGNOSIS — Z9889 Other specified postprocedural states: Secondary | ICD-10-CM

## 2016-09-14 MED ORDER — GABAPENTIN 100 MG PO CAPS: 100.0000 mg | ORAL_CAPSULE | Freq: Three times a day (TID) | ORAL | 2 refills | Status: DC

## 2016-09-14 NOTE — Progress Notes (Signed)
This is a routine postop appointment status post carpal tunnel release on the right on 06/28/2016. We sent her to physical therapy again hand motion    Encounter Diagnoses  Name Primary?  . Neuralgia and neuritis Yes  . Aftercare following surgery   . History of carpal tunnel surgery of right wrist      Her fingers seem to have gotten better in terms of numbness and tingling however pressure in the thenar eminence and over the incision since pain radiating proximally. Prior to surgery she did have some right radicular pain and right shoulder pain thought to be tendinitis  She is interested in having a left carpal tunnel release but we both decided that until we get her right arm under control we should wait and then actually reevaluate the entire right arm and shoulder including the neck which we will do on her next appointment  I did start on gabapentin 100 mg 3 times a day

## 2016-09-17 ENCOUNTER — Telehealth: Payer: Self-pay | Admitting: *Deleted

## 2016-09-17 DIAGNOSIS — R079 Chest pain, unspecified: Secondary | ICD-10-CM

## 2016-09-17 NOTE — Telephone Encounter (Signed)
-----   Message from Josue Hector, MD sent at 09/15/2016 12:01 PM EDT ----- Needs lexiscan myovue ETT non diagnostic

## 2016-09-17 NOTE — Telephone Encounter (Signed)
Called patient with test results. No answer. Left message to call back. Order placed  

## 2016-09-25 ENCOUNTER — Encounter (HOSPITAL_COMMUNITY): Payer: Medicaid Other

## 2016-09-25 ENCOUNTER — Inpatient Hospital Stay (HOSPITAL_COMMUNITY): Admission: RE | Admit: 2016-09-25 | Payer: Medicaid Other | Source: Ambulatory Visit

## 2016-10-19 ENCOUNTER — Ambulatory Visit: Payer: Medicaid Other | Admitting: Orthopedic Surgery

## 2016-11-12 ENCOUNTER — Encounter: Payer: Self-pay | Admitting: Obstetrics & Gynecology

## 2016-11-12 ENCOUNTER — Ambulatory Visit (INDEPENDENT_AMBULATORY_CARE_PROVIDER_SITE_OTHER): Payer: Medicaid Other | Admitting: Orthopedic Surgery

## 2016-11-12 ENCOUNTER — Encounter: Payer: Self-pay | Admitting: Orthopedic Surgery

## 2016-11-12 ENCOUNTER — Ambulatory Visit (INDEPENDENT_AMBULATORY_CARE_PROVIDER_SITE_OTHER): Payer: Medicaid Other

## 2016-11-12 ENCOUNTER — Ambulatory Visit: Payer: Medicaid Other

## 2016-11-12 ENCOUNTER — Ambulatory Visit (INDEPENDENT_AMBULATORY_CARE_PROVIDER_SITE_OTHER): Payer: Medicaid Other | Admitting: *Deleted

## 2016-11-12 ENCOUNTER — Encounter: Payer: Self-pay | Admitting: *Deleted

## 2016-11-12 VITALS — BP 105/70 | HR 85 | Ht 64.0 in | Wt 239.0 lb

## 2016-11-12 DIAGNOSIS — Z3042 Encounter for surveillance of injectable contraceptive: Secondary | ICD-10-CM | POA: Diagnosis not present

## 2016-11-12 DIAGNOSIS — Z3202 Encounter for pregnancy test, result negative: Secondary | ICD-10-CM | POA: Diagnosis not present

## 2016-11-12 DIAGNOSIS — M79601 Pain in right arm: Secondary | ICD-10-CM | POA: Diagnosis not present

## 2016-11-12 DIAGNOSIS — M25511 Pain in right shoulder: Secondary | ICD-10-CM | POA: Diagnosis not present

## 2016-11-12 DIAGNOSIS — M542 Cervicalgia: Secondary | ICD-10-CM | POA: Diagnosis not present

## 2016-11-12 DIAGNOSIS — Z308 Encounter for other contraceptive management: Secondary | ICD-10-CM

## 2016-11-12 LAB — POCT URINE PREGNANCY: PREG TEST UR: NEGATIVE

## 2016-11-12 MED ORDER — METHOCARBAMOL 500 MG PO TABS: 500.0000 mg | ORAL_TABLET | Freq: Three times a day (TID) | ORAL | 1 refills | Status: DC

## 2016-11-12 MED ORDER — MEDROXYPROGESTERONE ACETATE 150 MG/ML IM SUSP
150.0000 mg | Freq: Once | INTRAMUSCULAR | Status: AC
  Administered 2016-11-12: 150 mg via INTRAMUSCULAR

## 2016-11-12 NOTE — Progress Notes (Signed)
New problem   Chief Complaint  Patient presents with  . Hand Pain    right side  . Shoulder Pain    right side    36 year old female status post carpal tunnel release right hand presents with neck pain and shoulder pain and pain radiating down her arm into her right hand with associated loss of cervical motion in terms of rotating to the right. It's a dull burning type of pain it's intermittent it's mild it's partially relieved with gabapentin and she's had it for 4-6 weeks no acute trauma noted.  Review of Systems  Constitutional: Negative for chills, fever, malaise/fatigue and weight loss.  Cardiovascular: Negative for chest pain.  Musculoskeletal: Positive for back pain and neck pain.  Skin: Negative for itching and rash.  Neurological: Positive for tingling and sensory change. Negative for focal weakness and weakness.    Past Medical History:  Diagnosis Date  . Abnormal Pap smear   . Arthritis   . Asthma   . Asthma   . Bipolar 1 disorder ([place])   . Bronchitis   . Carpal tunnel syndrome   . Chest pain   . Elevated cholesterol   . High cholesterol   . Hyperlipidemia   . Hyperlipoproteinemia   . Post traumatic stress disorder   . Right ovarian cyst 10/31/2012   Complex right ovarian cyst seen 8/18 in ER need f/u US  . Tendonitis    Past Surgical History:  Procedure Laterality Date  . CARPAL TUNNEL RELEASE Right 06/28/2016   Procedure: RIGHT CARPAL TUNNEL RELEASE;  Surgeon: Carole Civil, MD;  Location: [facility];  Service: Orthopedics;  Laterality: Right;  . HERNIA REPAIR     umbilical   BP 122/48   Pulse 85   Ht 5\' 4"  (1.626 m)   Wt 239 lb (108.4 kg)   BMI 41.02 kg/m   General appearance normal grooming hygiene. Oriented 3. Mood pleasant affect normal.  Cervical spine no tenderness mild increase muscle tension on the right decreased rotation right normal left negative Spurling sign no instability. Resistance testing normal with neck rotation and flexion  extension. Skin normal.  Right shoulder left shoulder no tenderness or swelling full range of motion flexion extension rotation of shoulder stable rotator cuff strength normal  Skin normal on both arms  No sensory deficits or vascular deficit in the right or left upper extremity  She has tenderness over the palm from the pubis carpal tunnel incision but no sensory changes  Reflexes were normal right to left 2+ and equal  X-rays cervical spine show loss of cervical lordosis mild facet arthritis at C7-T1  Shoulder x-ray was noted  Please see my dictated reports  Encounter Diagnoses  Name Primary?  . Pain in joint of right shoulder   . Right arm pain   . Neck pain Yes    Recommend muscle relaxer for 4 weeks use heating pad  Follow-up as needed

## 2016-11-12 NOTE — Addendum Note (Signed)
Addended by: Amado Coe on: 11/12/2016 01:38 PM   Modules accepted: Orders

## 2016-11-12 NOTE — Progress Notes (Signed)
Pt here for Depo. Pt tolerated shot well.  Schedule physical before 01/28/17. Next Depo in 12 weeks. [place]

## 2017-01-03 ENCOUNTER — Other Ambulatory Visit: Payer: Medicaid Other | Admitting: Adult Health

## 2017-01-23 ENCOUNTER — Other Ambulatory Visit: Payer: Medicaid Other | Admitting: Adult Health

## 2017-01-28 ENCOUNTER — Other Ambulatory Visit: Payer: Medicaid Other | Admitting: Obstetrics and Gynecology

## 2017-01-31 ENCOUNTER — Ambulatory Visit (INDEPENDENT_AMBULATORY_CARE_PROVIDER_SITE_OTHER): Payer: Medicaid Other | Admitting: Adult Health

## 2017-01-31 ENCOUNTER — Encounter: Payer: Self-pay | Admitting: Adult Health

## 2017-01-31 VITALS — BP 106/70 | HR 76 | Ht 64.0 in | Wt 236.0 lb

## 2017-01-31 DIAGNOSIS — Z0000 Encounter for general adult medical examination without abnormal findings: Secondary | ICD-10-CM

## 2017-01-31 DIAGNOSIS — Z01419 Encounter for gynecological examination (general) (routine) without abnormal findings: Secondary | ICD-10-CM | POA: Diagnosis not present

## 2017-01-31 DIAGNOSIS — Z3042 Encounter for surveillance of injectable contraceptive: Secondary | ICD-10-CM

## 2017-01-31 MED ORDER — MEDROXYPROGESTERONE ACETATE 150 MG/ML IM SUSY
1.0000 mL | PREFILLED_SYRINGE | INTRAMUSCULAR | 3 refills | Status: DC
Start: 2017-01-31 — End: 2018-02-24

## 2017-01-31 NOTE — Progress Notes (Signed)
Patient ID: Breanna Malone, female   DOB: 1980-11-12, 36 y.o.   MRN: [id number] History of Present Illness:  Khalil is a 36 year old black female, widowed in for well woman gyn exam,she had normal pap with negative HPV 03/02/15.  PCP is Dr Legrand Rams.  Current Medications, Allergies, Past Medical History, Past Surgical History, Family History and Social History were reviewed in [employer] record.     Review of Systems:  Patient denies any headaches, hearing loss, fatigue, blurred vision, shortness of breath, chest pain, abdominal pain, problems with bowel movements, urination, or intercourse(not having sex). No joint pain or mood swings. + weight gain, she wants to get tubes tied.   Physical Exam:BP 106/70 (BP Location: Left Arm, Patient Position: Sitting, Cuff Size: Large)   Pulse 76   Ht 5\' 4"  (1.626 m)   Wt 236 lb (107 kg)   BMI 40.51 kg/m  General:  Well developed, well nourished, no acute distress Skin:  Warm and dry Neck:  Midline trachea, normal thyroid, good ROM, no lymphadenopathy Lungs; Clear to auscultation bilaterally Breast:  No dominant palpable mass, retraction, or nipple discharge Cardiovascular: Regular rate and rhythm Abdomen:  Soft, non tender, no hepatosplenomegaly Pelvic:  External genitalia is normal in appearance, no lesions.  The vagina is normal in appearance,spotting brown. Urethra has no lesions or masses. The cervix is bulbous.  Uterus is felt to be normal size, shape, and contour.  No adnexal masses or tenderness noted.Bladder is non tender, no masses felt. Extremities/musculoskeletal:  No swelling or varicosities noted, no clubbing or cyanosis Psych:  No mood changes, alert and cooperative,seems happy PHQ 2 score 0.Signed tubal papers today.  Impression: 1. Well woman exam with routine gynecological exam   2. Encounter for surveillance of injectable contraceptive       Plan: To get depo in am Return in 7 weeks for pre op with Dr  Glo Herring for tubal Physical in 1 year Pap in 2020 Review handout on tubal by [facility]

## 2017-02-01 ENCOUNTER — Ambulatory Visit (INDEPENDENT_AMBULATORY_CARE_PROVIDER_SITE_OTHER): Payer: Medicaid Other

## 2017-02-01 VITALS — Wt 233.6 lb

## 2017-02-01 DIAGNOSIS — Z3042 Encounter for surveillance of injectable contraceptive: Secondary | ICD-10-CM | POA: Diagnosis not present

## 2017-02-01 DIAGNOSIS — Z3202 Encounter for pregnancy test, result negative: Secondary | ICD-10-CM

## 2017-02-01 DIAGNOSIS — Z3049 Encounter for surveillance of other contraceptives: Secondary | ICD-10-CM

## 2017-02-01 LAB — POCT URINE PREGNANCY: Preg Test, Ur: NEGATIVE

## 2017-02-01 MED ORDER — MEDROXYPROGESTERONE ACETATE 150 MG/ML IM SUSP
150.0000 mg | Freq: Once | INTRAMUSCULAR | Status: AC
Start: 2017-02-01 — End: 2017-02-01
  Administered 2017-02-01: 150 mg via INTRAMUSCULAR

## 2017-02-01 NOTE — Addendum Note (Signed)
Addended by: Diona Fanti A on: 02/01/2017 11:34 AM   Modules accepted: Level of Service

## 2017-02-01 NOTE — Progress Notes (Signed)
PT here for depo shot 150 mg IM given rt deltoid. Tolerated well. Return 12 weeks for next shot. Pad CMA

## 2017-02-04 ENCOUNTER — Ambulatory Visit: Payer: Medicaid Other

## 2017-02-27 ENCOUNTER — Encounter: Payer: Self-pay | Admitting: Orthopedic Surgery

## 2017-02-27 ENCOUNTER — Telehealth: Payer: Self-pay | Admitting: Radiology

## 2017-02-27 ENCOUNTER — Ambulatory Visit (INDEPENDENT_AMBULATORY_CARE_PROVIDER_SITE_OTHER): Payer: Medicaid Other | Admitting: Orthopedic Surgery

## 2017-02-27 VITALS — BP 100/68 | HR 78 | Ht 64.0 in | Wt 232.0 lb

## 2017-02-27 DIAGNOSIS — G5602 Carpal tunnel syndrome, left upper limb: Secondary | ICD-10-CM | POA: Diagnosis not present

## 2017-02-27 MED ORDER — NAPROXEN 500 MG PO TABS: 500.0000 mg | ORAL_TABLET | Freq: Two times a day (BID) | ORAL | 2 refills | Status: DC

## 2017-02-27 NOTE — Addendum Note (Signed)
Addended by: Carole Civil on: 02/27/2017 09:38 AM   Modules accepted: Orders, SmartSet

## 2017-02-27 NOTE — Progress Notes (Signed)
Progress Note   Patient ID: Breanna Malone, female   DOB: Sep 13, 1980, 37 y.o.   MRN: [id number]  Chief Complaint  Patient presents with  . Hand Pain    bilateral     37 year old female with history of bilateral carpal tunnel syndrome status post right carpal tunnel release continues to complain of pain paresthesias swelling and tightness of the left hand.  She had prior nerve study and EMG studies confirming carpal tunnel syndrome she had an adequate course of nonoperative treatment did not improve.  She did well with her right carpal tunnel release  Complains of dull constant pain left wrist with numbness and tingling now for several years associated with swelling and locking       Review of Systems  Constitutional: Negative for chills and fever.  Musculoskeletal: Positive for joint pain.       Joint swelling both hands, locking both hands involving the metacarpophalangeal joints and the wrist   Past Medical History:  Diagnosis Date  . Abnormal Pap smear   . Arthritis   . Asthma   . Asthma   . Bipolar 1 disorder ([place])   . Bronchitis   . Carpal tunnel syndrome   . Cervical spine pain   . Chest pain   . Elevated cholesterol   . High cholesterol   . Hyperlipidemia   . Hyperlipoproteinemia   . Post traumatic stress disorder   . Right ovarian cyst 10/31/2012   Complex right ovarian cyst seen 8/18 in ER need f/u US  . Tendonitis   . Vaginal Pap smear, abnormal    Past Surgical History:  Procedure Laterality Date  . CARPAL TUNNEL RELEASE Right 06/28/2016   Procedure: RIGHT CARPAL TUNNEL RELEASE;  Surgeon: Carole Civil, MD;  Location: [facility];  Service: Orthopedics;  Laterality: Right;  . HERNIA REPAIR     umbilical    Current Meds  Medication Sig  . albuterol (PROVENTIL HFA;VENTOLIN HFA) 108 (90 BASE) MCG/ACT inhaler Inhale 1-2 puffs into the lungs every 6 (six) hours as needed for wheezing or shortness of breath. (Patient taking differently: Inhale 1-2 puffs into  the lungs every 6 (six) hours as needed for wheezing or shortness of breath. For wheezing/shortness of breath)  . gabapentin (NEURONTIN) 100 MG capsule Take 1 capsule (100 mg total) by mouth 3 (three) times daily.  Marland Kitchen lisinopril (PRINIVIL,ZESTRIL) 10 MG tablet Take 10 mg by mouth daily.  . MedroxyPROGESTERone Acetate 150 MG/ML SUSY Inject 1 mL (150 mg total) into the muscle every 3 (three) months.  . metFORMIN (GLUCOPHAGE) 500 MG tablet Take 500 mg by mouth 2 (two) times daily with a meal.  . methocarbamol (ROBAXIN) 500 MG tablet Take 1 tablet (500 mg total) by mouth 3 (three) times daily.  . pravastatin (PRAVACHOL) 40 MG tablet Take 40 mg by mouth daily.    Allergies  Allergen Reactions  . Darvocet [Propoxyphene N-Acetaminophen] Itching  . Hydrocodone-Acetaminophen Itching  . Lactose Intolerance (Gi) Other (See Comments)    Causes stomach cramping.  . Other Hives    Patient is allergic to corndogs.  . Penicillins Hives and Itching    Has patient had a PCN reaction causing immediate rash, facial/tongue/throat swelling, SOB or lightheadedness with hypotension:Yes Has patient had a PCN reaction causing severe rash involving mucus membranes or skin necrosis:No Has patient had a PCN reaction that required hospitalization:No Has patient had a PCN reaction occurring within the last 10 years:No If all of the above answers are "NO", then  may proceed with Cephalosporin use.   . Latex Itching and Rash  . Lortab [Hydrocodone-Acetaminophen] Rash  . Percocet [Oxycodone-Acetaminophen] Itching and Rash     BP 100/68   Pulse 78   Ht 5\' 4"  (1.626 m)   Wt 232 lb (105.2 kg)   BMI 39.82 kg/m   Physical Exam  Constitutional: She is oriented to person, place, and time. She appears well-developed and well-nourished.  Musculoskeletal:       Arms: Neurological: She is alert and oriented to person, place, and time.  Psychiatric: She has a normal mood and affect. Judgment normal.  Vitals  reviewed.    Medical decision-making Encounter Diagnosis  Name Primary?  . Carpal tunnel syndrome of left wrist Yes    Plan is for left carpal tunnel release  The procedure has been fully reviewed with the patient; The risks and benefits of surgery have been discussed and explained and understood. Alternative treatment has also been reviewed, questions were encouraged and answered. The postoperative plan is also been reviewed.     Arther Abbott, MD 02/27/2017 9:30 AM

## 2017-02-27 NOTE — Telephone Encounter (Signed)
-----   Message from Uvaldo Bristle sent at 02/27/2017  9:37 AM EST ----- Regarding: Pt's surgery - late Feb Breanna Malone - fyi  -surg by Dr Glo Herring sched'd 03/21/17 - req's for CT surg to be late Feb or early Mar

## 2017-02-27 NOTE — Telephone Encounter (Signed)
Patient wants surgery late Feb instead of beginning of the month.

## 2017-02-27 NOTE — Telephone Encounter (Signed)
CALL OR SCHEDULING TO MOVE THE DATE

## 2017-02-27 NOTE — Patient Instructions (Signed)
2 notes for work and school

## 2017-03-01 NOTE — Telephone Encounter (Signed)
Message sent to [facility], will call patient when RS

## 2017-03-04 NOTE — Telephone Encounter (Signed)
I spoke to patient, she states this is better for her.

## 2017-03-04 NOTE — Telephone Encounter (Signed)
Feb 28th is new surgery date.

## 2017-03-21 ENCOUNTER — Encounter: Payer: Medicaid Other | Admitting: Obstetrics and Gynecology

## 2017-03-25 ENCOUNTER — Other Ambulatory Visit (HOSPITAL_COMMUNITY): Payer: Medicaid Other

## 2017-04-08 ENCOUNTER — Ambulatory Visit (HOSPITAL_COMMUNITY)
Admission: RE | Admit: 2017-04-08 | Discharge: 2017-04-08 | Disposition: A | Discharge status: Home / Self Care | Payer: Medicaid Other | Source: Ambulatory Visit | Attending: Internal Medicine | Admitting: Internal Medicine

## 2017-04-08 ENCOUNTER — Telehealth: Payer: Self-pay | Admitting: Orthopedic Surgery

## 2017-04-08 ENCOUNTER — Other Ambulatory Visit (HOSPITAL_COMMUNITY): Payer: Self-pay | Admitting: Internal Medicine

## 2017-04-08 DIAGNOSIS — M25531 Pain in right wrist: Secondary | ICD-10-CM | POA: Diagnosis not present

## 2017-04-08 DIAGNOSIS — R52 Pain, unspecified: Secondary | ICD-10-CM

## 2017-04-08 NOTE — Telephone Encounter (Signed)
Patient came by office today asking to be seen for immediate appointment for hand pain and swelling, which appears to be a new problem.  Discussed availability, and also that patient's insurance requires referral from primary care. States will call her doctor to request.

## 2017-04-10 NOTE — Patient Instructions (Signed)
Breanna Malone  04/10/2017     @PREFPERIOPPHARMACY @   Your procedure is scheduled on  04/18/2017 .  Report to [facility] at   615  A.M.  Call this number if you have problems the morning of surgery:  [phone]   Remember:  Do not eat food or drink liquids after midnight.  Take these medicines the morning of surgery with A SIP OF WATER  Neurontin, lisinopril, claritin, robaxin.   Do not wear jewelry, make-up or nail polish.  Do not wear lotions, powders, or perfumes, or deodorant.  Do not shave 48 hours prior to surgery.  Men may shave face and neck.  Do not bring valuables to the hospital.  [facility] is not responsible for any belongings or valuables.  Contacts, dentures or bridgework may not be worn into surgery.  Leave your suitcase in the car.  After surgery it may be brought to your room.  For patients admitted to the hospital, discharge time will be determined by your treatment team.  Patients discharged the day of surgery will not be allowed to drive home.   Name and phone number of your driver:   family Special instructions:  None  Please read over the following fact sheets that you were given. Anesthesia Post-op Instructions and Care and Recovery After Surgery       Carpal Tunnel Release Carpal tunnel release is a surgical procedure to relieve numbness and pain in your hand that are caused by carpal tunnel syndrome. Your carpal tunnel is a narrow, hollow space in your wrist. It passes between your wrist bones and a band of connective tissue (transverse carpal ligament). The nerve that supplies most of your hand (median nerve) passes through this space, and so do the connections between your fingers and the muscles of your arm (tendons). Carpal tunnel syndrome makes this space swell and become narrow, and this causes pain and numbness. In carpal tunnel release surgery, a surgeon cuts through the transverse carpal ligament to make more room in the  carpal tunnel space. You may have this surgery if other types of treatment have not worked. Tell a health care provider about:  Any allergies you have.  All medicines you are taking, including vitamins, herbs, eye drops, creams, and over-the-counter medicines.  Any problems you or family members have had with anesthetic medicines.  Any blood disorders you have.  Any surgeries you have had.  Any medical conditions you have. What are the risks? Generally, this is a safe procedure. However, problems may occur, including:  Bleeding.  Infection.  Injury to the median nerve.  Need for additional surgery.  What happens before the procedure?  Ask your health care provider about: ? Changing or stopping your regular medicines. This is especially important if you are taking diabetes medicines or blood thinners. ? Taking medicines such as aspirin and ibuprofen. These medicines can thin your blood. Do not take these medicines before your procedure if your health care provider instructs you not to.  Do not eat or drink anything after midnight on the night before the procedure or as directed by your health care provider.  Plan to have someone take you home after the procedure. What happens during the procedure?  An IV tube may be inserted into a vein.  You will be given one of the following: ? A medicine that numbs the wrist area (local anesthetic). You may also be given a medicine to  make you relax (sedative). ? A medicine that makes you go to sleep (general anesthetic).  Your arm, hand, and wrist will be cleaned with a germ-killing solution (antiseptic).  Your surgeon will make a surgical cut (incision) over the palm side of your wrist. The surgeon will pull aside the skin of your wrist to expose the carpal tunnel space.  The surgeon will cut the transverse carpal ligament.  The edges of the incision will be closed with stitches (sutures) or staples.  A bandage (dressing) will be  placed over your wrist and wrapped around your hand and wrist. What happens after the procedure?  You may spend some time in a recovery area.  Your blood pressure, heart rate, breathing rate, and blood oxygen level will be monitored often until the medicines you were given have worn off.  You will likely have some pain. You will be given pain medicine.  You may need to wear a splint or a wrist brace over your dressing. This information is not intended to replace advice given to you by your health care provider. Make sure you discuss any questions you have with your health care provider. Document Released: 04/28/2003 Document Revised: 07/14/2015 Document Reviewed: 09/23/2013 Elsevier Interactive Patient Education  2018 [place] After Refer to this sheet in the next few weeks. These instructions provide you with information about caring for yourself after your procedure. Your health care provider may also give you more specific instructions. Your treatment has been planned according to current medical practices, but problems sometimes occur. Call your health care provider if you have any problems or questions after your procedure. What can I expect after the procedure? After your procedure, it is typical to have the following:  Pain.  Numbness.  Tingling.  Swelling.  Stiffness.  Bruising.  Follow these instructions at home:  Take medicines only as directed by your health care provider.  There are many different ways to close and cover an incision, including stitches (sutures), skin glue, and adhesive strips. Follow your health care provider's instructions about: ? Incision care. ? Bandage (dressing) changes and removal. ? Incision closure removal.  Wear a splint or a brace as directed by your surgeon. You may need to do this for 2-3 weeks.  Keep your hand raised (elevated) above the level of your heart while you are resting. Move your fingers  often.  Avoid activities that cause hand pain.  Ask your surgeon when you can start to do all of your usual activities again, such as: ? Driving. ? Returning to work. ? Bathing and swimming.  Keep all follow-up visits as directed by your health care provider. This is important. You may need physical therapy for several months to speed healing and regain movement. Contact a health care provider if:  You have drainage, redness, swelling, or pain at your incision site.  You have a fever.  You have chills.  Your pain medicine is not working.  Your symptoms do not go away after 2 months.  Your symptoms go away and then return. Get help right away if:  You have pain or numbness that is getting worse.  Your fingers change color.  You are not able to move your fingers. This information is not intended to replace advice given to you by your health care provider. Make sure you discuss any questions you have with your health care provider. Document Released: 08/25/2004 Document Revised: 07/14/2015 Document Reviewed: 09/23/2013 Elsevier Interactive Patient Education  2018  [place] Anesthesia is a term that refers to techniques, procedures, and medicines that help a person stay safe and comfortable during a medical procedure. Monitored anesthesia care, or sedation, is one type of anesthesia. Your anesthesia specialist may recommend sedation if you will be having a procedure that does not require you to be unconscious, such as:  Cataract surgery.  A dental procedure.  A biopsy.  A colonoscopy.  During the procedure, you may receive a medicine to help you relax (sedative). There are three levels of sedation:  Mild sedation. At this level, you may feel awake and relaxed. You will be able to follow directions.  Moderate sedation. At this level, you will be sleepy. You may not remember the procedure.  Deep sedation. At this level, you will be asleep.  You will not remember the procedure.  The more medicine you are given, the deeper your level of sedation will be. Depending on how you respond to the procedure, the anesthesia specialist may change your level of sedation or the type of anesthesia to fit your needs. An anesthesia specialist will monitor you closely during the procedure. Let your health care provider know about:  Any allergies you have.  All medicines you are taking, including vitamins, herbs, eye drops, creams, and over-the-counter medicines.  Any use of steroids (by mouth or as a cream).  Any problems you or family members have had with sedatives and anesthetic medicines.  Any blood disorders you have.  Any surgeries you have had.  Any medical conditions you have, such as sleep apnea.  Whether you are pregnant or may be pregnant.  Any use of cigarettes, alcohol, or street drugs. What are the risks? Generally, this is a safe procedure. However, problems may occur, including:  Getting too much medicine (oversedation).  Nausea.  Allergic reaction to medicines.  Trouble breathing. If this happens, a breathing tube may be used to help with breathing. It will be removed when you are awake and breathing on your own.  Heart trouble.  Lung trouble.  Before the procedure Staying hydrated Follow instructions from your health care provider about hydration, which may include:  Up to 2 hours before the procedure - you may continue to drink clear liquids, such as water, clear fruit juice, black coffee, and plain tea.  Eating and drinking restrictions Follow instructions from your health care provider about eating and drinking, which may include:  8 hours before the procedure - stop eating heavy meals or foods such as meat, fried foods, or fatty foods.  6 hours before the procedure - stop eating light meals or foods, such as toast or cereal.  6 hours before the procedure - stop drinking milk or drinks that contain  milk.  2 hours before the procedure - stop drinking clear liquids.  Medicines Ask your health care provider about:  Changing or stopping your regular medicines. This is especially important if you are taking diabetes medicines or blood thinners.  Taking medicines such as aspirin and ibuprofen. These medicines can thin your blood. Do not take these medicines before your procedure if your health care provider instructs you not to.  Tests and exams  You will have a physical exam.  You may have blood tests done to show: ? How well your kidneys and liver are working. ? How well your blood can clot.  General instructions  Plan to have someone take you home from the hospital or clinic.  If you will be going  home right after the procedure, plan to have someone with you for 24 hours.  What happens during the procedure?  Your blood pressure, heart rate, breathing, level of pain and overall condition will be monitored.  An IV tube will be inserted into one of your veins.  Your anesthesia specialist will give you medicines as needed to keep you comfortable during the procedure. This may mean changing the level of sedation.  The procedure will be performed. After the procedure  Your blood pressure, heart rate, breathing rate, and blood oxygen level will be monitored until the medicines you were given have worn off.  Do not drive for 24 hours if you received a sedative.  You may: ? Feel sleepy, clumsy, or nauseous. ? Feel forgetful about what happened after the procedure. ? Have a sore throat if you had a breathing tube during the procedure. ? Vomit. This information is not intended to replace advice given to you by your health care provider. Make sure you discuss any questions you have with your health care provider. Document Released: 11/01/2004 Document Revised: 07/15/2015 Document Reviewed: 05/29/2015 Elsevier Interactive Patient Education  2018 [place], Care After These instructions provide you with information about caring for yourself after your procedure. Your health care provider may also give you more specific instructions. Your treatment has been planned according to current medical practices, but problems sometimes occur. Call your health care provider if you have any problems or questions after your procedure. What can I expect after the procedure? After your procedure, it is common to:  Feel sleepy for several hours.  Feel clumsy and have poor balance for several hours.  Feel forgetful about what happened after the procedure.  Have poor judgment for several hours.  Feel nauseous or vomit.  Have a sore throat if you had a breathing tube during the procedure.  Follow these instructions at home: For at least 24 hours after the procedure:   Do not: ? Participate in activities in which you could fall or become injured. ? Drive. ? Use heavy machinery. ? Drink alcohol. ? Take sleeping pills or medicines that cause drowsiness. ? Make important decisions or sign legal documents. ? Take care of children on your own.  Rest. Eating and drinking  Follow the diet that is recommended by your health care provider.  If you vomit, drink water, juice, or soup when you can drink without vomiting.  Make sure you have little or no nausea before eating solid foods. General instructions  Have a responsible adult stay with you until you are awake and alert.  Take over-the-counter and prescription medicines only as told by your health care provider.  If you smoke, do not smoke without supervision.  Keep all follow-up visits as told by your health care provider. This is important. Contact a health care provider if:  You keep feeling nauseous or you keep vomiting.  You feel light-headed.  You develop a rash.  You have a fever. Get help right away if:  You have trouble breathing. This information is not  intended to replace advice given to you by your health care provider. Make sure you discuss any questions you have with your health care provider. Document Released: 05/29/2015 Document Revised: 09/28/2015 Document Reviewed: 05/29/2015 Elsevier Interactive Patient Education  [employer].

## 2017-04-11 ENCOUNTER — Encounter: Payer: Medicaid Other | Admitting: Obstetrics and Gynecology

## 2017-04-11 ENCOUNTER — Telehealth: Payer: Self-pay | Admitting: Obstetrics and Gynecology

## 2017-04-11 NOTE — Telephone Encounter (Signed)
Pt NO SHOW'ed for appt, "pre-op". Message left for pt to call to reschedule. NO visits to our office in over a year.

## 2017-04-12 ENCOUNTER — Other Ambulatory Visit (HOSPITAL_COMMUNITY): Payer: Medicaid Other

## 2017-04-12 ENCOUNTER — Encounter (HOSPITAL_COMMUNITY): Payer: Self-pay

## 2017-04-12 ENCOUNTER — Encounter (HOSPITAL_COMMUNITY)
Admission: RE | Admit: 2017-04-12 | Discharge: 2017-04-12 | Disposition: A | Discharge status: Home / Self Care | Payer: Medicaid Other | Source: Ambulatory Visit | Attending: Orthopedic Surgery | Admitting: Orthopedic Surgery

## 2017-04-17 ENCOUNTER — Telehealth: Payer: Self-pay | Admitting: Radiology

## 2017-04-17 NOTE — Telephone Encounter (Signed)
Patient no showed her pre op appointment Monday, and has not called Breanna Malone back about surgery, which is tomorrow  Case has been cancelled

## 2017-04-18 ENCOUNTER — Ambulatory Visit (HOSPITAL_COMMUNITY): Admission: RE | Admit: 2017-04-18 | Payer: Medicaid Other | Source: Ambulatory Visit | Admitting: Orthopedic Surgery

## 2017-04-18 ENCOUNTER — Encounter (HOSPITAL_COMMUNITY): Admission: RE | Payer: Self-pay | Source: Ambulatory Visit

## 2017-04-18 SURGERY — CARPAL TUNNEL RELEASE
Anesthesia: Regional | Laterality: Left

## 2017-06-03 ENCOUNTER — Emergency Department (HOSPITAL_COMMUNITY)
Admission: EM | Admit: 2017-06-03 | Discharge: 2017-06-03 | Disposition: A | Discharge status: Home / Self Care | Payer: Medicaid Other | Attending: Emergency Medicine | Admitting: Emergency Medicine

## 2017-06-03 ENCOUNTER — Emergency Department (HOSPITAL_COMMUNITY): Payer: Medicaid Other

## 2017-06-03 ENCOUNTER — Other Ambulatory Visit: Payer: Self-pay

## 2017-06-03 ENCOUNTER — Encounter (HOSPITAL_COMMUNITY): Payer: Self-pay | Admitting: Emergency Medicine

## 2017-06-03 DIAGNOSIS — Z91040 Latex allergy status: Secondary | ICD-10-CM | POA: Insufficient documentation

## 2017-06-03 DIAGNOSIS — J45909 Unspecified asthma, uncomplicated: Secondary | ICD-10-CM | POA: Diagnosis not present

## 2017-06-03 DIAGNOSIS — Y939 Activity, unspecified: Secondary | ICD-10-CM | POA: Diagnosis not present

## 2017-06-03 DIAGNOSIS — S299XXA Unspecified injury of thorax, initial encounter: Secondary | ICD-10-CM | POA: Diagnosis present

## 2017-06-03 DIAGNOSIS — X500XXA Overexertion from strenuous movement or load, initial encounter: Secondary | ICD-10-CM | POA: Insufficient documentation

## 2017-06-03 DIAGNOSIS — T148XXA Other injury of unspecified body region, initial encounter: Secondary | ICD-10-CM

## 2017-06-03 DIAGNOSIS — R52 Pain, unspecified: Secondary | ICD-10-CM

## 2017-06-03 DIAGNOSIS — S29012A Strain of muscle and tendon of back wall of thorax, initial encounter: Secondary | ICD-10-CM | POA: Insufficient documentation

## 2017-06-03 DIAGNOSIS — Y929 Unspecified place or not applicable: Secondary | ICD-10-CM | POA: Insufficient documentation

## 2017-06-03 DIAGNOSIS — Z79899 Other long term (current) drug therapy: Secondary | ICD-10-CM | POA: Insufficient documentation

## 2017-06-03 DIAGNOSIS — Y999 Unspecified external cause status: Secondary | ICD-10-CM | POA: Insufficient documentation

## 2017-06-03 LAB — URINALYSIS, ROUTINE W REFLEX MICROSCOPIC
Bilirubin Urine: NEGATIVE
Glucose, UA: NEGATIVE mg/dL
KETONES UR: NEGATIVE mg/dL
Leukocytes, UA: NEGATIVE
Nitrite: NEGATIVE
PROTEIN: NEGATIVE mg/dL
Specific Gravity, Urine: 1.02 (ref 1.005–1.030)
pH: 6 (ref 5.0–8.0)

## 2017-06-03 LAB — POC URINE PREG, ED: Preg Test, Ur: NEGATIVE

## 2017-06-03 MED ORDER — DICLOFENAC SODIUM 75 MG PO TBEC: 75.0000 mg | DELAYED_RELEASE_TABLET | Freq: Two times a day (BID) | ORAL | 0 refills | Status: DC

## 2017-06-03 MED ORDER — METHOCARBAMOL 500 MG PO TABS: 500.0000 mg | ORAL_TABLET | Freq: Three times a day (TID) | ORAL | 0 refills | Status: DC

## 2017-06-03 NOTE — ED Notes (Signed)
Pt unable to provide urine sample at this time 

## 2017-06-03 NOTE — ED Notes (Signed)
Patient transported to X-ray 

## 2017-06-03 NOTE — ED Provider Notes (Signed)
[facility] EMERGENCY DEPARTMENT Provider Note   CSN: [id number] Arrival date & time: 06/03/17  5462     History   Chief Complaint Chief Complaint  Patient presents with  . Back Pain    HPI Breanna Malone is a 37 y.o. female.  HPI   Breanna Malone is a 37 y.o. female who presents to the Emergency Department complaining of right upper back pain for 1 week.  She denies known injury but states that she does a lot of housework which includes heavy lifting and taking care of a small child.  She reports pain is associated with certain movements and improves at rest.  She describes the pain as aching along her right shoulder blade area.  She has been taking over-the-counter pain relievers which are not helping.  She denies chest pain, shortness of breath, numbness or weakness of her extremities, headache and neck pain.   Past Medical History:  Diagnosis Date  . Abnormal Pap smear   . Arthritis   . Asthma   . Asthma   . Bipolar 1 disorder ([place])   . Bronchitis   . Carpal tunnel syndrome   . Cervical spine pain   . Chest pain   . Elevated cholesterol   . High cholesterol   . Hyperlipidemia   . Hyperlipoproteinemia   . Post traumatic stress disorder   . Right ovarian cyst 10/31/2012   Complex right ovarian cyst seen 8/18 in ER need f/u US  . Tendonitis   . Vaginal Pap smear, abnormal     Patient Active Problem List   Diagnosis Date Noted  . Encounter for surveillance of injectable contraceptive 01/31/2017  . Well woman exam with routine gynecological exam 01/31/2017  . Carpal tunnel syndrome of right wrist   . Condyloma acuminata 09/08/2014  . Condyloma acuminatum in female 02/24/2014  . URI (upper respiratory infection) 02/18/2013  . Abnormal Pap smear of cervix 11/24/2012  . Asthma 11/24/2012  . Right ovarian cyst 10/31/2012  . Bipolar 1 disorder ([place]) 10/31/2012    Past Surgical History:  Procedure Laterality Date  . CARPAL TUNNEL RELEASE Right 06/28/2016   Procedure: RIGHT CARPAL TUNNEL RELEASE;  Surgeon: Carole Civil, MD;  Location: [facility];  Service: Orthopedics;  Laterality: Right;  . HERNIA REPAIR     umbilical     OB History    Gravida  7   Para  6   Term  6   Preterm      AB  1   Living  4     SAB  1   TAB      Ectopic      Multiple      Live Births  6            Home Medications    Prior to Admission medications   Medication Sig Start Date End Date Taking? Authorizing Provider  albuterol (PROVENTIL HFA;VENTOLIN HFA) 108 (90 BASE) MCG/ACT inhaler Inhale 1-2 puffs into the lungs every 6 (six) hours as needed for wheezing or shortness of breath. Patient taking differently: Inhale 1-2 puffs into the lungs every 6 (six) hours as needed for wheezing or shortness of breath. For wheezing/shortness of breath 04/30/14   Debroah Baller M, NP  gabapentin (NEURONTIN) 100 MG capsule Take 1 capsule (100 mg total) by mouth 3 (three) times daily. Patient taking differently: Take 100 mg by mouth 3 (three) times daily as needed (for hand cramping).  09/14/16   Carole Civil,  MD  lisinopril (PRINIVIL,ZESTRIL) 10 MG tablet Take 10 mg by mouth daily.    [provider]  loratadine (CLARITIN) 10 MG tablet Take 10 mg by mouth daily.    [provider]  MedroxyPROGESTERone Acetate 150 MG/ML SUSY Inject 1 mL (150 mg total) into the muscle every 3 (three) months. 01/31/17   Estill Dooms, NP  metFORMIN (GLUCOPHAGE) 500 MG tablet Take 500 mg by mouth 2 (two) times daily with a meal.    [provider]  methocarbamol (ROBAXIN) 500 MG tablet Take 1 tablet (500 mg total) by mouth 3 (three) times daily. Patient taking differently: Take 500 mg by mouth 3 (three) times daily as needed (for hand cramping).  11/12/16   Carole Civil, MD  naproxen (NAPROSYN) 500 MG tablet Take 1 tablet (500 mg total) by mouth 2 (two) times daily with a meal. Patient taking differently: Take 500 mg by mouth 2 (two) times  daily as needed (for pain.).  02/27/17   Carole Civil, MD  pravastatin (PRAVACHOL) 40 MG tablet Take 40 mg by mouth daily.    [provider]    Family History Family History  Problem Relation Age of Onset  . Diabetes Mother   . Hypertension Mother   . Asthma Father   . Bronchitis Daughter   . Asthma Daughter   . Stroke Paternal Grandmother   . Heart attack Paternal Grandmother   . Cancer Paternal Grandmother   . Asthma Son   . Bronchitis Son   . Asthma Daughter   . Bronchitis Daughter   . Asthma Son   . Asthma Other   . Bronchitis Other   . Cancer Cousin     Social History Social History   Tobacco Use  . Smoking status: Never Smoker  . Smokeless tobacco: Never Used  Substance Use Topics  . Alcohol use: No  . Drug use: No     Allergies   Darvocet [propoxyphene n-acetaminophen]; Hydrocodone-acetaminophen; Lactose intolerance (gi); Other; Penicillins; Latex; Lortab [hydrocodone-acetaminophen]; and Percocet [oxycodone-acetaminophen]   Review of Systems Review of Systems  Constitutional: Negative for chills and fever.  Respiratory: Negative for cough and shortness of breath.   Cardiovascular: Negative for chest pain.  Gastrointestinal: Negative for abdominal pain, diarrhea, nausea and vomiting.  Genitourinary: Negative for difficulty urinating and dysuria.  Musculoskeletal: Positive for back pain. Negative for neck pain.  Skin: Negative for color change and wound.  Neurological: Negative for dizziness, weakness and numbness.  All other systems reviewed and are negative.    Physical Exam Updated Vital Signs Ht 5\' 4"  (1.626 m)   Wt 105.2 kg (232 lb)   BMI 39.82 kg/m   Physical Exam  Constitutional: She is oriented to person, place, and time. She appears well-developed and well-nourished. No distress.  HENT:  Head: Atraumatic.  Neck: Normal range of motion and phonation normal. Neck supple. No spinous process tenderness present. Normal range of  motion present.  Cardiovascular: Normal rate, regular rhythm, normal heart sounds and intact distal pulses.  No murmur heard. Pulmonary/Chest: Effort normal and breath sounds normal. No respiratory distress.  Abdominal: Soft. She exhibits no distension. There is no tenderness. There is no guarding.  Musculoskeletal: Normal range of motion.  Tender to palpation of the right trapezius muscle and the musculature around the right scapula.  No edema  Neurological: She is alert and oriented to person, place, and time. No cranial nerve deficit or sensory deficit.  Skin: Skin is warm. Capillary refill takes  less than 2 seconds.  Psychiatric: She has a normal mood and affect.  Nursing note and vitals reviewed.    ED Treatments / Results  Labs (all labs ordered are listed, but only abnormal results are displayed) Labs Reviewed  URINALYSIS, ROUTINE W REFLEX MICROSCOPIC - Abnormal; Notable for the following components:      Result Value   APPearance HAZY (*)    Hgb urine dipstick SMALL (*)    Bacteria, UA RARE (*)    Squamous Epithelial / LPF 0-5 (*)    All other components within normal limits  URINE CULTURE  POC URINE PREG, ED    EKG None  Radiology Dg Thoracic Spine W/swimmers  Result Date: 06/03/2017 CLINICAL DATA:  Right-sided chest pain for several days, no known injury, initial encounter EXAM: THORACIC SPINE - 3 VIEWS COMPARISON:  07/11/2016 FINDINGS: Vertebral body height is well maintained. Pedicles are within normal limits. No paraspinal mass is noted. An expansile lesion of the left ninth rib is again identified and stable. No other focal abnormality is seen. IMPRESSION: Stable expansile lesion of the left ninth rib. No acute thoracic spine abnormality is noted. Electronically Signed   By: Inez Catalina M.D.   On: 06/03/2017 11:44    Procedures Procedures (including critical care time)  Medications Ordered in ED Medications - No data to display   Initial Impression /  Assessment and Plan / ED Course  I have reviewed the triage vital signs and the nursing notes.  Pertinent labs & imaging results that were available during my care of the patient were reviewed by me and considered in my medical decision making (see chart for details).     Patient well-appearing.  Moves all extremities well.  No motor weakness or neurological deficit.  Pain is reproduced with palpation along the muscles of the right scapular border.  I feel that her symptoms are musculoskeletal in origin.  She agrees to treatment plan and PCP follow-up if needed.  Final Clinical Impressions(s) / ED Diagnoses   Final diagnoses:  Muscle strain    ED Discharge Orders    None       Kem Parkinson, Hershal Coria 06/04/17 1956    Long, Wonda Olds, MD 06/05/17 1423

## 2017-06-03 NOTE — Discharge Instructions (Addendum)
Alternate ice and heat to your upper back.  Do not take naproxen or ibuprofen Aleve or Advil or aspirin while taking your prescription medications.  Follow-up with Dr. Legrand Rams for recheck in 1 week if not improving.

## 2017-06-03 NOTE — ED Notes (Signed)
Pt unable to provide urine specimen at this time. Liquids offered

## 2017-06-03 NOTE — ED Triage Notes (Signed)
Mid back pain for one week. Denies injury. States taking pain pills isn't helping

## 2017-06-05 LAB — URINE CULTURE

## 2017-06-06 ENCOUNTER — Ambulatory Visit (INDEPENDENT_AMBULATORY_CARE_PROVIDER_SITE_OTHER): Payer: Medicaid Other

## 2017-06-06 ENCOUNTER — Other Ambulatory Visit: Payer: Medicaid Other

## 2017-06-06 DIAGNOSIS — Z3042 Encounter for surveillance of injectable contraceptive: Secondary | ICD-10-CM

## 2017-06-06 LAB — BETA HCG QUANT (REF LAB): HCG QUANT: 2 m[IU]/mL

## 2017-06-06 MED ORDER — MEDROXYPROGESTERONE ACETATE 150 MG/ML IM SUSP
150.0000 mg | Freq: Once | INTRAMUSCULAR | Status: AC
  Administered 2017-06-06: 150 mg via INTRAMUSCULAR

## 2017-06-06 NOTE — Progress Notes (Signed)
Pt here for depo injection 150 mg IM given Lt deltoid. Tolerated well. Return 12 weeks for next injection. Serum HCG Negative.Pad CMA

## 2017-08-29 ENCOUNTER — Ambulatory Visit: Payer: Medicaid Other

## 2017-09-04 ENCOUNTER — Ambulatory Visit (INDEPENDENT_AMBULATORY_CARE_PROVIDER_SITE_OTHER): Payer: Medicaid Other | Admitting: *Deleted

## 2017-09-04 ENCOUNTER — Encounter: Payer: Self-pay | Admitting: *Deleted

## 2017-09-04 DIAGNOSIS — Z3202 Encounter for pregnancy test, result negative: Secondary | ICD-10-CM | POA: Diagnosis not present

## 2017-09-04 DIAGNOSIS — Z3042 Encounter for surveillance of injectable contraceptive: Secondary | ICD-10-CM | POA: Diagnosis not present

## 2017-09-04 DIAGNOSIS — R309 Painful micturition, unspecified: Secondary | ICD-10-CM | POA: Diagnosis not present

## 2017-09-04 LAB — POCT URINALYSIS DIPSTICK OB
Glucose, UA: NEGATIVE — AB
Ketones, UA: NEGATIVE
LEUKOCYTES UA: NEGATIVE
NITRITE UA: NEGATIVE
PROTEIN: NEGATIVE

## 2017-09-04 LAB — POCT URINE PREGNANCY: PREG TEST UR: NEGATIVE

## 2017-09-04 MED ORDER — MEDROXYPROGESTERONE ACETATE 150 MG/ML IM SUSP
150.0000 mg | Freq: Once | INTRAMUSCULAR | Status: AC
  Administered 2017-09-04: 150 mg via INTRAMUSCULAR

## 2017-09-04 NOTE — Progress Notes (Signed)
Pt here for Depo. Pt received shot in right deltoid. Pt tolerated shot well. Pt also mentions pain with urination. I dipped urine and it shows a trace of blood. I spoke with JAG and she advised to push fluids. Let us know if anything changes. Next depo in 12 weeks. Pt voiced understanding. [place]

## 2017-09-12 ENCOUNTER — Encounter: Payer: Self-pay | Admitting: Obstetrics and Gynecology

## 2017-09-12 ENCOUNTER — Ambulatory Visit (INDEPENDENT_AMBULATORY_CARE_PROVIDER_SITE_OTHER): Payer: Medicaid Other | Admitting: Obstetrics and Gynecology

## 2017-09-12 VITALS — BP 113/70 | HR 76 | Ht 64.0 in | Wt 229.2 lb

## 2017-09-12 DIAGNOSIS — Z8742 Personal history of other diseases of the female genital tract: Secondary | ICD-10-CM

## 2017-09-12 DIAGNOSIS — N946 Dysmenorrhea, unspecified: Secondary | ICD-10-CM | POA: Diagnosis not present

## 2017-09-12 DIAGNOSIS — Z3009 Encounter for other general counseling and advice on contraception: Secondary | ICD-10-CM

## 2017-09-12 DIAGNOSIS — Z3042 Encounter for surveillance of injectable contraceptive: Secondary | ICD-10-CM

## 2017-09-12 NOTE — Progress Notes (Signed)
Patient ID: Breanna Malone, female   DOB: 08/13/1980, 37 y.o.   MRN: [id number]    [facility] Visit  @DATE @            Patient name: Breanna Malone MRN [id number]  Date of birth: 12/21/1980  CC & HPI:  Breanna Malone is a 37 y.o. female presenting today to discuss the risks and benefits of a tubal ligation. Her husband passed away 2.5 yrs ago from a heart attack/diabetes at 39 y.o. She does not have a partner at this time and her brother in law helps out with her daughter. She used Depo and does not have her period. She reports weight gain from Depo. When she did have her period, she had bad cramping.  She is interested in endometrial ablation for she can get off the Depo provera.  The patient denies fever, chills or any other symptoms or complaints at this time.   ROS:  ROS  -fever -chills  All systems are negative except as noted in the HPI and PMH.   Pertinent History Reviewed:   Reviewed Medical         Past Medical History:  Diagnosis Date  . Abnormal Pap smear   . Arthritis   . Asthma   . Asthma   . Bipolar 1 disorder ([place])   . Bronchitis   . Carpal tunnel syndrome   . Cervical spine pain   . Chest pain   . Elevated cholesterol   . High cholesterol   . Hyperlipidemia   . Hyperlipoproteinemia   . Post traumatic stress disorder   . Right ovarian cyst 10/31/2012   Complex right ovarian cyst seen 8/18 in ER need f/u US  . Tendonitis   . Vaginal Pap smear, abnormal                               Surgical Hx:    Past Surgical History:  Procedure Laterality Date  . CARPAL TUNNEL RELEASE Right 06/28/2016   Procedure: RIGHT CARPAL TUNNEL RELEASE;  Surgeon: Carole Civil, MD;  Location: [facility];  Service: Orthopedics;  Laterality: Right;  . HERNIA REPAIR     umbilical   Medications: Reviewed & Updated - see associated section                       Current Outpatient Medications:  .  albuterol (PROVENTIL HFA;VENTOLIN HFA) 108 (90 BASE) MCG/ACT inhaler, Inhale  1-2 puffs into the lungs every 6 (six) hours as needed for wheezing or shortness of breath., Disp: 1 Inhaler, Rfl: 0 .  ibuprofen (ADVIL,MOTRIN) 200 MG tablet, Take 800 mg by mouth as needed., Disp: , Rfl:  .  lisinopril (PRINIVIL,ZESTRIL) 10 MG tablet, Take 5 mg by mouth daily. , Disp: , Rfl:  .  MedroxyPROGESTERone Acetate 150 MG/ML SUSY, Inject 1 mL (150 mg total) into the muscle every 3 (three) months., Disp: 1 mL, Rfl: 3 .  metFORMIN (GLUCOPHAGE) 500 MG tablet, Take 500 mg by mouth 2 (two) times daily with a meal., Disp: , Rfl:  .  pravastatin (PRAVACHOL) 40 MG tablet, Take 40 mg by mouth daily., Disp: , Rfl:    Social History: Reviewed -  reports that she has never smoked. She has never used smokeless tobacco.  Objective Findings:  Vitals: Blood pressure 113/70, pulse 76, height 5\' 4"  (1.626 m), weight 229 lb 3.2 oz (104 kg).  PHYSICAL EXAMINATION General appearance - alert, well appearing, and in no distress, oriented to person, place, and time and overweight Mental status - alert, oriented to person, place, and time, normal mood, behavior, speech, dress, motor activity, and thought processes, affect appropriate to mood  PELVIC DEFERRED  Discussion: 1. Discussed with pt risks and benefits of BTL 2. Discussed risk and benefits of endometrial ablation at the same time as the BTL  At end of discussion, pt had opportunity to ask questions and has no further questions at this time.   Specific discussion of BTL as noted above. Greater than 50% was spent in counseling and coordination of care with the patient.   Total time greater than: 15 minutes.    Assessment & Plan:   A:  1. Desire for BTL tubal sterilization papers signed today 2. Menorrhagia  3. History of diagnosis of ovarian cyst in the emergency room 2018 without follow-up ultrasound  P:  1. Schedule BTL and endometrial ablation for 10/29/2017 2. F/U 4 weeks for pre op visit 3. Schedule ultrasound pelvis during the  following month   By signing my name below, I, [name], attest that this documentation has been prepared under the direction and in the presence of Jonnie Kind, MD. Electronically Signed: [name], Medical Scribe. 09/12/17. 10:35 AM.  I personally performed the services described in this documentation, which was SCRIBED in my presence. The recorded information has been reviewed and considered accurate. It has been edited as necessary during review. Jonnie Kind, MD

## 2017-10-09 ENCOUNTER — Encounter: Payer: Medicaid Other | Admitting: Obstetrics and Gynecology

## 2017-10-16 ENCOUNTER — Other Ambulatory Visit: Payer: Self-pay

## 2017-10-16 ENCOUNTER — Emergency Department (HOSPITAL_COMMUNITY): Payer: Medicaid Other

## 2017-10-16 ENCOUNTER — Encounter (HOSPITAL_COMMUNITY): Payer: Self-pay | Admitting: Emergency Medicine

## 2017-10-16 ENCOUNTER — Emergency Department (HOSPITAL_COMMUNITY)
Admission: EM | Admit: 2017-10-16 | Discharge: 2017-10-17 | Disposition: A | Discharge status: Home / Self Care | Payer: Medicaid Other | Attending: Emergency Medicine | Admitting: Emergency Medicine

## 2017-10-16 DIAGNOSIS — M25562 Pain in left knee: Secondary | ICD-10-CM | POA: Diagnosis not present

## 2017-10-16 DIAGNOSIS — Z79899 Other long term (current) drug therapy: Secondary | ICD-10-CM | POA: Insufficient documentation

## 2017-10-16 DIAGNOSIS — Y9301 Activity, walking, marching and hiking: Secondary | ICD-10-CM | POA: Insufficient documentation

## 2017-10-16 DIAGNOSIS — Y92038 Other place in apartment as the place of occurrence of the external cause: Secondary | ICD-10-CM | POA: Diagnosis not present

## 2017-10-16 DIAGNOSIS — Y999 Unspecified external cause status: Secondary | ICD-10-CM | POA: Insufficient documentation

## 2017-10-16 DIAGNOSIS — J45909 Unspecified asthma, uncomplicated: Secondary | ICD-10-CM | POA: Diagnosis not present

## 2017-10-16 DIAGNOSIS — Z91040 Latex allergy status: Secondary | ICD-10-CM | POA: Diagnosis not present

## 2017-10-16 DIAGNOSIS — W1849XA Other slipping, tripping and stumbling without falling, initial encounter: Secondary | ICD-10-CM | POA: Diagnosis not present

## 2017-10-16 MED ORDER — IBUPROFEN 800 MG PO TABS: 800.0000 mg | ORAL_TABLET | Freq: Three times a day (TID) | ORAL | 0 refills | Status: DC

## 2017-10-16 MED ORDER — IBUPROFEN 800 MG PO TABS
800.0000 mg | ORAL_TABLET | Freq: Once | ORAL | Status: AC
  Administered 2017-10-16: 800 mg via ORAL
  Filled 2017-10-16: qty 1

## 2017-10-16 NOTE — ED Triage Notes (Signed)
Pt C/O left knee pain that started 1 week ago. Pt denies any injury or falls. No deformity noted. Pt states the left knee "gave out" at work.

## 2017-10-16 NOTE — Discharge Instructions (Addendum)
Apply ice packs on/off to your knee.  Wear the knee brace for support, but do not wear continuously or at bedtime.  Call the orthopedic provider's office listed in one week if the pain is not improving.  Try to limit walking up and down steps for at least 2 weeks.

## 2017-10-17 ENCOUNTER — Ambulatory Visit (INDEPENDENT_AMBULATORY_CARE_PROVIDER_SITE_OTHER): Payer: Medicaid Other | Admitting: Obstetrics and Gynecology

## 2017-10-17 ENCOUNTER — Encounter: Payer: Self-pay | Admitting: Obstetrics and Gynecology

## 2017-10-17 VITALS — BP 120/72 | HR 74 | Ht 64.0 in | Wt 222.6 lb

## 2017-10-17 DIAGNOSIS — Z3009 Encounter for other general counseling and advice on contraception: Secondary | ICD-10-CM | POA: Diagnosis not present

## 2017-10-17 MED ORDER — MEGESTROL ACETATE 40 MG PO TABS: 40.0000 mg | ORAL_TABLET | Freq: Every day | ORAL | 2 refills | Status: DC
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
## 2017-10-17 NOTE — ED Notes (Signed)
Pt requesting d/c paperwork re-printed. This RN accessed chart to give AVS to pt.

## 2017-10-17 NOTE — ED Provider Notes (Signed)
[facility] EMERGENCY DEPARTMENT Provider Note   CSN: [id number] Arrival date & time: 10/16/17  2220     History   Chief Complaint Chief Complaint  Patient presents with  . Knee Pain    HPI JULIO STORR is a 37 y.o. female.  HPI   ZARIELLE CEA is a 37 y.o. female who presents to the Emergency Department complaining of left knee pain for one week.  She describes the pain as aching and pain is worse with bending her knee.  She contributes this to living in an up stairs apartment and having to climb stairs.  Earlier tonight she states her knee "gave out" she reports a stumble, but no fall.  Pain improves at rest and when she takes ibuprofen.  She denies redness, swelling, calf pain, and numbness of the extremity.    Past Medical History:  Diagnosis Date  . Abnormal Pap smear   . Arthritis   . Asthma   . Asthma   . Bipolar 1 disorder ([place])   . Bronchitis   . Carpal tunnel syndrome   . Cervical spine pain   . Chest pain   . Elevated cholesterol   . High cholesterol   . Hyperlipidemia   . Hyperlipoproteinemia   . Post traumatic stress disorder   . Right ovarian cyst 10/31/2012   Complex right ovarian cyst seen 8/18 in ER need f/u US  . Tendonitis   . Vaginal Pap smear, abnormal     Patient Active Problem List   Diagnosis Date Noted  . Encounter for surveillance of injectable contraceptive 01/31/2017  . Well woman exam with routine gynecological exam 01/31/2017  . Carpal tunnel syndrome of right wrist   . Condyloma acuminata 09/08/2014  . Condyloma acuminatum in female 02/24/2014  . URI (upper respiratory infection) 02/18/2013  . Abnormal Pap smear of cervix 11/24/2012  . Asthma 11/24/2012  . Right ovarian cyst 10/31/2012  . Bipolar 1 disorder ([place]) 10/31/2012    Past Surgical History:  Procedure Laterality Date  . CARPAL TUNNEL RELEASE Right 06/28/2016   Procedure: RIGHT CARPAL TUNNEL RELEASE;  Surgeon: Carole Civil, MD;  Location: [facility];  Service:  Orthopedics;  Laterality: Right;  . HERNIA REPAIR     umbilical     OB History    Gravida  7   Para  6   Term  6   Preterm      AB  1   Living  4     SAB  1   TAB      Ectopic      Multiple      Live Births  6            Home Medications    Prior to Admission medications   Medication Sig Start Date End Date Taking? Authorizing Provider  albuterol (PROVENTIL HFA;VENTOLIN HFA) 108 (90 BASE) MCG/ACT inhaler Inhale 1-2 puffs into the lungs every 6 (six) hours as needed for wheezing or shortness of breath. 04/30/14   Ashley Murrain, NP  ibuprofen (ADVIL,MOTRIN) 800 MG tablet Take 1 tablet (800 mg total) by mouth 3 (three) times daily. 10/16/17   Arrietty Dercole, PA-C  lisinopril (PRINIVIL,ZESTRIL) 10 MG tablet Take 5 mg by mouth daily.     [provider]  MedroxyPROGESTERone Acetate 150 MG/ML SUSY Inject 1 mL (150 mg total) into the muscle every 3 (three) months. 01/31/17   Estill Dooms, NP  metFORMIN (GLUCOPHAGE) 500 MG tablet Take 500 mg  by mouth 2 (two) times daily with a meal.    [provider]  pravastatin (PRAVACHOL) 40 MG tablet Take 40 mg by mouth daily.    [provider]    Family History Family History  Problem Relation Age of Onset  . Diabetes Mother   . Hypertension Mother   . Asthma Father   . Bronchitis Daughter   . Asthma Daughter   . Stroke Paternal Grandmother   . Heart attack Paternal Grandmother   . Cancer Paternal Grandmother   . Asthma Son   . Bronchitis Son   . Asthma Daughter   . Bronchitis Daughter   . Asthma Son   . Asthma Other   . Bronchitis Other   . Cancer Cousin     Social History Social History   Tobacco Use  . Smoking status: Never Smoker  . Smokeless tobacco: Never Used  Substance Use Topics  . Alcohol use: No  . Drug use: No     Allergies   Darvocet [propoxyphene n-acetaminophen]; Hydrocodone-acetaminophen; Lactose intolerance (gi); Other; Penicillins; Latex; Lortab  [hydrocodone-acetaminophen]; and Percocet [oxycodone-acetaminophen]   Review of Systems Review of Systems  Constitutional: Negative for chills and fever.  Gastrointestinal: Negative for diarrhea and vomiting.  Musculoskeletal: Positive for arthralgias (left knee pain). Negative for joint swelling.  Skin: Negative for color change and wound.  Neurological: Negative for weakness and numbness.     Physical Exam Updated Vital Signs BP 124/88 (BP Location: Right Arm)   Pulse 75   Temp 99.1 F (37.3 C) (Oral)   Resp 17   LMP 10/16/2017   SpO2 97%   Physical Exam  Constitutional: She appears well-developed and well-nourished. No distress.  HENT:  Mouth/Throat: Oropharynx is clear and moist.  Cardiovascular: Normal rate, regular rhythm and intact distal pulses.  Pulmonary/Chest: Effort normal and breath sounds normal. No respiratory distress.  Musculoskeletal: Normal range of motion. She exhibits tenderness.  Diffuse ttp of the anterior and medial left knee.  No excessive warmth, erythema or edema.  Pain reproduced on valgus and varus stress.  Neg drawer sign.    Neurological: She is alert. No sensory deficit.  Skin: Skin is warm. Capillary refill takes less than 2 seconds. No erythema.  Nursing note and vitals reviewed.    ED Treatments / Results  Labs (all labs ordered are listed, but only abnormal results are displayed) Labs Reviewed - No data to display  EKG None  Radiology Dg Knee Complete 4 Views Left  Result Date: 10/16/2017 CLINICAL DATA:  37 year old female with left knee pain x1 week. EXAM: LEFT KNEE - COMPLETE 4+ VIEW COMPARISON:  Left knee radiograph dated 12/09/2014 FINDINGS: The pelvic fracture or dislocation. The bones are well mineralized. No arthritic changes. There is a small suprapatellar effusion. The soft tissues are unremarkable. IMPRESSION: 1. No acute fracture or dislocation. 2. Small suprapatellar effusion. Electronically Signed   By: Anner Crete  M.D.   On: 10/16/2017 22:52    Procedures Procedures (including critical care time)  Medications Ordered in ED Medications  ibuprofen (ADVIL,MOTRIN) tablet 800 mg (800 mg Oral Given 10/16/17 2359)     Initial Impression / Assessment and Plan / ED Course  I have reviewed the triage vital signs and the nursing notes.  Pertinent labs & imaging results that were available during my care of the patient were reviewed by me and considered in my medical decision making (see chart for details).     Pt with left knee pain.  NV  intact.  No concerning sx's for septic joint.   Knee sleeve applied.  Pt agrees to RICE therapy and close orthopedic f/u.  Request referral info for [facility].    Final Clinical Impressions(s) / ED Diagnoses   Final diagnoses:  Acute pain of left knee    ED Discharge Orders         Ordered    ibuprofen (ADVIL,MOTRIN) 800 MG tablet  3 times daily     10/16/17 2354           Kem Parkinson, PA-C 10/17/17 0019    Francine Graven, DO 10/17/17 2259

## 2017-10-20 ENCOUNTER — Encounter (HOSPITAL_COMMUNITY): Payer: Self-pay | Admitting: *Deleted

## 2017-10-20 ENCOUNTER — Other Ambulatory Visit: Payer: Self-pay

## 2017-10-20 ENCOUNTER — Emergency Department (HOSPITAL_COMMUNITY)
Admission: EM | Admit: 2017-10-20 | Discharge: 2017-10-20 | Disposition: A | Discharge status: Home / Self Care | Payer: Medicaid Other | Attending: Emergency Medicine | Admitting: Emergency Medicine

## 2017-10-20 DIAGNOSIS — R05 Cough: Secondary | ICD-10-CM | POA: Diagnosis present

## 2017-10-20 DIAGNOSIS — B9789 Other viral agents as the cause of diseases classified elsewhere: Secondary | ICD-10-CM | POA: Insufficient documentation

## 2017-10-20 DIAGNOSIS — J069 Acute upper respiratory infection, unspecified: Secondary | ICD-10-CM | POA: Diagnosis not present

## 2017-10-20 DIAGNOSIS — Z91040 Latex allergy status: Secondary | ICD-10-CM | POA: Insufficient documentation

## 2017-10-20 DIAGNOSIS — Z79899 Other long term (current) drug therapy: Secondary | ICD-10-CM | POA: Diagnosis not present

## 2017-10-20 DIAGNOSIS — J45909 Unspecified asthma, uncomplicated: Secondary | ICD-10-CM | POA: Diagnosis not present

## 2017-10-20 MED ORDER — IPRATROPIUM-ALBUTEROL 0.5-2.5 (3) MG/3ML IN SOLN
3.0000 mL | Freq: Once | RESPIRATORY_TRACT | Status: AC
  Administered 2017-10-20: 3 mL via RESPIRATORY_TRACT
  Filled 2017-10-20: qty 3

## 2017-10-20 MED ORDER — ALBUTEROL SULFATE HFA 108 (90 BASE) MCG/ACT IN AERS
2.0000 | INHALATION_SPRAY | Freq: Once | RESPIRATORY_TRACT | Status: AC
  Administered 2017-10-20: 2 via RESPIRATORY_TRACT
  Filled 2017-10-20: qty 6.7

## 2017-10-20 MED ORDER — IPRATROPIUM-ALBUTEROL 0.5-2.5 (3) MG/3ML IN SOLN
RESPIRATORY_TRACT | Status: AC
  Administered 2017-10-20: 20:00:00
  Filled 2017-10-20: qty 3

## 2017-10-20 MED ORDER — PREDNISONE 20 MG PO TABS: 40.0000 mg | ORAL_TABLET | Freq: Every day | ORAL | 0 refills | Status: DC

## 2017-10-20 MED ORDER — BENZONATATE 200 MG PO CAPS: 200.0000 mg | ORAL_CAPSULE | Freq: Three times a day (TID) | ORAL | 0 refills | Status: DC

## 2017-10-20 NOTE — ED Provider Notes (Signed)
[facility] EMERGENCY DEPARTMENT Provider Note   CSN: [id number] Arrival date & time: 10/20/17  1909     History   Chief Complaint Chief Complaint  Patient presents with  . Cough    HPI Breanna Malone is a 37 y.o. female.  HPI   Breanna Malone is a 37 y.o. female who presents to the Emergency Department complaining of cough, generalized body aches, nasal congestion and runny nose.  Symptoms began last evening.  She states her cough is been productive of yellow sputum.  She also reports a history of asthma but does not currently have an albuterol inhaler.  She denies fever, chest pain, or shortness of breath.  Cough is intermittent but worse with exertion.  She also reports pain to her lower back that is associated with movement and improves at rest.  Pain is similar to previous low back pain.  She denies known injury, pain or numbness of her lower extremities, urine or bowel changes, and abdominal pain.  She took one over-the-counter allergy pill earlier today with no significant improvement.   Past Medical History:  Diagnosis Date  . Abnormal Pap smear   . Arthritis   . Asthma   . Asthma   . Bipolar 1 disorder ([place])   . Bronchitis   . Carpal tunnel syndrome   . Cervical spine pain   . Chest pain   . Elevated cholesterol   . High cholesterol   . Hyperlipidemia   . Hyperlipoproteinemia   . Post traumatic stress disorder   . Right ovarian cyst 10/31/2012   Complex right ovarian cyst seen 8/18 in ER need f/u US  . Tendonitis   . Vaginal Pap smear, abnormal     Patient Active Problem List   Diagnosis Date Noted  . Encounter for surveillance of injectable contraceptive 01/31/2017  . Well woman exam with routine gynecological exam 01/31/2017  . Carpal tunnel syndrome of right wrist   . Condyloma acuminata 09/08/2014  . Condyloma acuminatum in female 02/24/2014  . URI (upper respiratory infection) 02/18/2013  . Abnormal Pap smear of cervix 11/24/2012  . Asthma  11/24/2012  . Right ovarian cyst 10/31/2012  . Bipolar 1 disorder ([place]) 10/31/2012    Past Surgical History:  Procedure Laterality Date  . CARPAL TUNNEL RELEASE Right 06/28/2016   Procedure: RIGHT CARPAL TUNNEL RELEASE;  Surgeon: Carole Civil, MD;  Location: [facility];  Service: Orthopedics;  Laterality: Right;  . HERNIA REPAIR     umbilical     OB History    Gravida  7   Para  6   Term  6   Preterm      AB  1   Living  4     SAB  1   TAB      Ectopic      Multiple      Live Births  6            Home Medications    Prior to Admission medications   Medication Sig Start Date End Date Taking? Authorizing Provider  albuterol (PROVENTIL HFA;VENTOLIN HFA) 108 (90 BASE) MCG/ACT inhaler Inhale 1-2 puffs into the lungs every 6 (six) hours as needed for wheezing or shortness of breath. 04/30/14   Ashley Murrain, NP  ibuprofen (ADVIL,MOTRIN) 800 MG tablet Take 1 tablet (800 mg total) by mouth 3 (three) times daily. 10/16/17   Marzella Miracle, PA-C  lisinopril (PRINIVIL,ZESTRIL) 10 MG tablet Take 5 mg by mouth daily.  [provider]  MedroxyPROGESTERone Acetate 150 MG/ML SUSY Inject 1 mL (150 mg total) into the muscle every 3 (three) months. 01/31/17   Estill Dooms, NP  megestrol (MEGACE) 40 MG tablet Take 1 tablet (40 mg total) by mouth daily. To suppress breakthru bleeding on Depo 10/17/17   Jonnie Kind, MD  metFORMIN (GLUCOPHAGE) 500 MG tablet Take 500 mg by mouth 2 (two) times daily with a meal.    [provider]  pravastatin (PRAVACHOL) 40 MG tablet Take 40 mg by mouth daily.    [provider]    Family History Family History  Problem Relation Age of Onset  . Diabetes Mother   . Hypertension Mother   . Asthma Father   . Bronchitis Daughter   . Asthma Daughter   . Stroke Paternal Grandmother   . Heart attack Paternal Grandmother   . Cancer Paternal Grandmother   . Asthma Son   . Bronchitis Son   . Asthma Daughter    . Bronchitis Daughter   . Asthma Son   . Asthma Other   . Bronchitis Other   . Cancer Cousin     Social History Social History   Tobacco Use  . Smoking status: Never Smoker  . Smokeless tobacco: Never Used  Substance Use Topics  . Alcohol use: No  . Drug use: No     Allergies   Darvocet [propoxyphene n-acetaminophen]; Hydrocodone-acetaminophen; Lactose intolerance (gi); Other; Penicillins; Latex; Lortab [hydrocodone-acetaminophen]; and Percocet [oxycodone-acetaminophen]   Review of Systems Review of Systems  Constitutional: Negative for appetite change, chills and fever.  HENT: Positive for congestion and rhinorrhea. Negative for sore throat and trouble swallowing.   Respiratory: Positive for cough. Negative for chest tightness, shortness of breath and wheezing.   Cardiovascular: Negative for chest pain.  Gastrointestinal: Negative for abdominal pain, nausea and vomiting.  Genitourinary: Negative for dysuria and flank pain.  Musculoskeletal: Positive for back pain. Negative for arthralgias.  Skin: Negative for rash.  Neurological: Negative for dizziness, weakness and numbness.  Hematological: Negative for adenopathy.     Physical Exam Updated Vital Signs BP 115/63 (BP Location: Right Arm)   Pulse 86   Temp 98.9 F (37.2 C) (Oral)   Resp 20   Ht 5\' 4"  (1.626 m)   Wt 100.7 kg   LMP 10/16/2017   SpO2 99%   BMI 38.11 kg/m   Physical Exam  Constitutional: She appears well-developed and well-nourished. No distress.  HENT:  Right Ear: Tympanic membrane and ear canal normal.  Left Ear: Tympanic membrane and ear canal normal.  Nose: Mucosal edema and rhinorrhea present.  Mouth/Throat: Uvula is midline, oropharynx is clear and moist and mucous membranes are normal. No uvula swelling.  Neck: Normal range of motion. Neck supple. No JVD present.  Cardiovascular: Normal rate, regular rhythm and intact distal pulses.  No murmur heard. Pulmonary/Chest: Effort normal.  No respiratory distress.  Coarse lung sounds bilaterally, improve after coughing.  No rales or wheezing noted.  Musculoskeletal: Normal range of motion. She exhibits no edema.  Lymphadenopathy:    She has no cervical adenopathy.  Neurological: She is alert. No sensory deficit.  Skin: Skin is warm. Capillary refill takes less than 2 seconds.  Nursing note and vitals reviewed.    ED Treatments / Results  Labs (all labs ordered are listed, but only abnormal results are displayed) Labs Reviewed - No data to display  EKG EKG Interpretation  Date/Time:  Sunday October 20 2017 20:26:05 EDT Ventricular  Rate:  79 PR Interval:    QRS Duration: 106 QT Interval:  396 QTC Calculation: 867 R Axis:   26 Text Interpretation:  Sinus rhythm Low voltage, precordial leads Confirmed by Julianne Rice [phone]) on 10/20/2017 8:34:06 PM   Radiology No results found.  Procedures Procedures (including critical care time)  Medications Ordered in ED Medications  ipratropium-albuterol (DUONEB) 0.5-2.5 (3) MG/3ML nebulizer solution 3 mL (3 mLs Nebulization Given 10/20/17 1958)  albuterol (PROVENTIL HFA;VENTOLIN HFA) 108 (90 Base) MCG/ACT inhaler 2 puff (2 puffs Inhalation Given 10/20/17 1936)  ipratropium-albuterol (DUONEB) 0.5-2.5 (3) MG/3ML nebulizer solution (  Given by Other 10/20/17 2001)     Initial Impression / Assessment and Plan / ED Course  I have reviewed the triage vital signs and the nursing notes.  Pertinent labs & imaging results that were available during my care of the patient were reviewed by me and considered in my medical decision making (see chart for details).     Patient well-appearing.  Vital signs reassuring.  Symptoms are likely related to bronchitis, she has a history of asthma and has not used an albuterol inhaler in some time.  she is requesting albuterol neb treatment.  After albuterol treatment, patient reports having chest pressure and heart racing and dizziness.   Will obtain EKG.  Symptoms are felt to be related to the albuterol, patient reassured.  Will observe.  On recheck, pt reports feeling better.  Previous symptoms resolving.  Vitals reassuring as well as EKG.  Pt appropriate for discharge home, agrees to treatment plan and outpatient follow-up if needed.  Return precautions discussed.  Final Clinical Impressions(s) / ED Diagnoses   Final diagnoses:  Viral URI with cough    ED Discharge Orders    None       Bufford Lope 10/20/17 2150    Julianne Rice, MD 10/20/17 2308

## 2017-10-20 NOTE — Discharge Instructions (Addendum)
1 to 2 puffs of the albuterol inhaler every 4-6 hours as needed.  Drink plenty of fluids.  You may take Tylenol if needed for fever.  Follow-up with your primary doctor for recheck.

## 2017-10-20 NOTE — Progress Notes (Signed)
Patient complains of nasal congestion, can't breath. Has hx of asthma lung sounds decreased.

## 2017-10-20 NOTE — ED Triage Notes (Signed)
Pt c/o cough that is productive with yellow phlegm,  back ache, body aches that started last night, pt states that she took an allergy pill otc today with no improvement, denies any fever, n/v/d,

## 2017-11-06 ENCOUNTER — Encounter: Payer: Medicaid Other | Admitting: Obstetrics and Gynecology

## 2017-11-13 ENCOUNTER — Ambulatory Visit: Payer: Medicaid Other | Admitting: Orthopedic Surgery

## 2017-11-14 ENCOUNTER — Encounter: Payer: Self-pay | Admitting: Orthopedic Surgery

## 2017-11-27 ENCOUNTER — Encounter (INDEPENDENT_AMBULATORY_CARE_PROVIDER_SITE_OTHER): Payer: Self-pay

## 2017-11-27 ENCOUNTER — Ambulatory Visit (INDEPENDENT_AMBULATORY_CARE_PROVIDER_SITE_OTHER): Payer: Medicaid Other | Admitting: *Deleted

## 2017-11-27 DIAGNOSIS — Z3202 Encounter for pregnancy test, result negative: Secondary | ICD-10-CM | POA: Diagnosis not present

## 2017-11-27 DIAGNOSIS — Z3042 Encounter for surveillance of injectable contraceptive: Secondary | ICD-10-CM | POA: Diagnosis not present

## 2017-11-27 LAB — POCT URINE PREGNANCY: Preg Test, Ur: NEGATIVE

## 2017-11-27 MED ORDER — MEDROXYPROGESTERONE ACETATE 150 MG/ML IM SUSP
150.0000 mg | Freq: Once | INTRAMUSCULAR | Status: AC
  Administered 2017-11-27: 150 mg via INTRAMUSCULAR

## 2017-11-27 NOTE — Progress Notes (Signed)
Depo Provera 150 mg given IM in left deltoid. Patient tolerated well. Next dose in 12 weeks.  

## 2018-02-20 ENCOUNTER — Ambulatory Visit: Payer: Medicaid Other

## 2018-02-21 ENCOUNTER — Encounter (INDEPENDENT_AMBULATORY_CARE_PROVIDER_SITE_OTHER): Payer: Self-pay

## 2018-02-21 ENCOUNTER — Other Ambulatory Visit: Payer: Self-pay | Admitting: Adult Health

## 2018-02-21 ENCOUNTER — Encounter: Payer: Self-pay | Admitting: *Deleted

## 2018-02-21 ENCOUNTER — Ambulatory Visit (INDEPENDENT_AMBULATORY_CARE_PROVIDER_SITE_OTHER): Payer: Medicaid Other | Admitting: *Deleted

## 2018-02-21 VITALS — Ht 64.0 in | Wt 225.0 lb

## 2018-02-21 DIAGNOSIS — R300 Dysuria: Secondary | ICD-10-CM | POA: Diagnosis not present

## 2018-02-21 DIAGNOSIS — Z3042 Encounter for surveillance of injectable contraceptive: Secondary | ICD-10-CM

## 2018-02-21 LAB — POCT URINALYSIS DIPSTICK
Glucose, UA: NEGATIVE
Nitrite, UA: POSITIVE
Protein, UA: NEGATIVE

## 2018-02-21 LAB — POCT URINE PREGNANCY: PREG TEST UR: NEGATIVE

## 2018-02-21 MED ORDER — MEDROXYPROGESTERONE ACETATE 150 MG/ML IM SUSP
150.0000 mg | Freq: Once | INTRAMUSCULAR | Status: AC
  Administered 2018-02-21: 150 mg via INTRAMUSCULAR

## 2018-02-21 NOTE — Progress Notes (Signed)
Depo Provera 150 mg given IM in right deltoid. Patient tolerated well. Next dose in 12 weeks. Pt also complaining of dysuria. Urine sent for culture.

## 2018-02-23 LAB — URINE CULTURE

## 2018-02-26 ENCOUNTER — Telehealth: Payer: Self-pay | Admitting: Obstetrics & Gynecology

## 2018-02-26 ENCOUNTER — Telehealth: Payer: Self-pay | Admitting: *Deleted

## 2018-02-26 MED ORDER — SULFAMETHOXAZOLE-TRIMETHOPRIM 800-160 MG PO TABS: 1.0000 | ORAL_TABLET | Freq: Two times a day (BID) | ORAL | 0 refills | Status: DC

## 2018-02-26 NOTE — Telephone Encounter (Signed)
Attempted to call pt to inform of positive urine culture and rx sent to pharmacy; unable to reach pt as her phone has calling restrictions.

## 2018-02-26 NOTE — Telephone Encounter (Signed)
Meds ordered this encounter  Medications  . sulfamethoxazole-trimethoprim (BACTRIM DS,SEPTRA DS) 800-160 MG tablet    Sig: Take 1 tablet by mouth 2 (two) times daily.    Dispense:  14 tablet    Refill:  0

## 2018-03-17 ENCOUNTER — Other Ambulatory Visit: Payer: Self-pay

## 2018-03-17 ENCOUNTER — Encounter (HOSPITAL_COMMUNITY): Payer: Self-pay | Admitting: Emergency Medicine

## 2018-03-17 ENCOUNTER — Emergency Department (HOSPITAL_COMMUNITY)
Admission: EM | Admit: 2018-03-17 | Discharge: 2018-03-17 | Disposition: A | Discharge status: Home / Self Care | Payer: Medicaid Other | Attending: Emergency Medicine | Admitting: Emergency Medicine

## 2018-03-17 ENCOUNTER — Emergency Department (HOSPITAL_COMMUNITY): Payer: Medicaid Other

## 2018-03-17 DIAGNOSIS — J209 Acute bronchitis, unspecified: Secondary | ICD-10-CM | POA: Diagnosis not present

## 2018-03-17 DIAGNOSIS — J45909 Unspecified asthma, uncomplicated: Secondary | ICD-10-CM | POA: Diagnosis not present

## 2018-03-17 DIAGNOSIS — M546 Pain in thoracic spine: Secondary | ICD-10-CM | POA: Diagnosis present

## 2018-03-17 DIAGNOSIS — Z7984 Long term (current) use of oral hypoglycemic drugs: Secondary | ICD-10-CM | POA: Insufficient documentation

## 2018-03-17 DIAGNOSIS — Z79899 Other long term (current) drug therapy: Secondary | ICD-10-CM | POA: Insufficient documentation

## 2018-03-17 DIAGNOSIS — Z91040 Latex allergy status: Secondary | ICD-10-CM | POA: Insufficient documentation

## 2018-03-17 MED ORDER — BENZONATATE 100 MG PO CAPS: 200.0000 mg | ORAL_CAPSULE | Freq: Three times a day (TID) | ORAL | 0 refills | Status: DC | PRN

## 2018-03-17 MED ORDER — PREDNISONE 50 MG PO TABS
60.0000 mg | ORAL_TABLET | Freq: Once | ORAL | Status: AC
  Administered 2018-03-17: 60 mg via ORAL
  Filled 2018-03-17: qty 1

## 2018-03-17 MED ORDER — PREDNISONE 10 MG PO TABS: ORAL_TABLET | ORAL | 0 refills | Status: DC

## 2018-03-17 MED ORDER — BENZONATATE 100 MG PO CAPS
200.0000 mg | ORAL_CAPSULE | Freq: Once | ORAL | Status: AC
  Administered 2018-03-17: 200 mg via ORAL
  Filled 2018-03-17: qty 2

## 2018-03-17 NOTE — Discharge Instructions (Signed)
Use the medications prescribed to help with your back pain and with your frequency of cough.  You may also try using a heating pad applied to your back for 20 minutes 3-4 times daily.  Which may help your pain symptoms.  As discussed, your chest x-ray does not show any new or acute findings, no pneumonia.  Plan a recheck for any persistent or worsening symptoms.

## 2018-03-17 NOTE — ED Triage Notes (Signed)
Patient reports back pain with cough, symptoms over a week.

## 2018-03-17 NOTE — ED Provider Notes (Signed)
[facility] EMERGENCY DEPARTMENT Provider Note   CSN: [id number] Arrival date & time: 03/17/18  1836     History   Chief Complaint Chief Complaint  Patient presents with  . Back Pain    HPI Breanna Malone is a 38 y.o. female with a history as outlined below, most significant for asthma and bronchitis presenting with a one-week history of cough which triggers bilateral upper back pain.  She describes a burning sensation in her back with coughing.  Her cough is been nonproductive, she also denies chest pain or shortness of breath, but does endorse episodes of wheezing.  She has had no fevers or chills, also no neck pain, neck stiffness, nausea, vomiting, palpitations or abdominal pain.   Also denies myalgias, sore throat, nasal congestion.  She has had no treatments prior to arrival for her symptoms.  The history is provided by the patient.    Past Medical History:  Diagnosis Date  . Abnormal Pap smear   . Arthritis   . Asthma   . Asthma   . Bipolar 1 disorder ([place])   . Bronchitis   . Carpal tunnel syndrome   . Cervical spine pain   . Chest pain   . Elevated cholesterol   . High cholesterol   . Hyperlipidemia   . Hyperlipoproteinemia   . Post traumatic stress disorder   . Right ovarian cyst 10/31/2012   Complex right ovarian cyst seen 8/18 in ER need f/u US  . Tendonitis   . Vaginal Pap smear, abnormal     Patient Active Problem List   Diagnosis Date Noted  . Encounter for surveillance of injectable contraceptive 01/31/2017  . Well woman exam with routine gynecological exam 01/31/2017  . Carpal tunnel syndrome of right wrist   . Condyloma acuminata 09/08/2014  . Condyloma acuminatum in female 02/24/2014  . URI (upper respiratory infection) 02/18/2013  . Abnormal Pap smear of cervix 11/24/2012  . Asthma 11/24/2012  . Right ovarian cyst 10/31/2012  . Bipolar 1 disorder ([place]) 10/31/2012    Past Surgical History:  Procedure Laterality Date  . CARPAL TUNNEL RELEASE  Right 06/28/2016   Procedure: RIGHT CARPAL TUNNEL RELEASE;  Surgeon: Carole Civil, MD;  Location: [facility];  Service: Orthopedics;  Laterality: Right;  . HERNIA REPAIR     umbilical     OB History    Gravida  7   Para  6   Term  6   Preterm      AB  1   Living  4     SAB  1   TAB      Ectopic      Multiple      Live Births  6            Home Medications    Prior to Admission medications   Medication Sig Start Date End Date Taking? Authorizing Provider  albuterol (PROVENTIL HFA;VENTOLIN HFA) 108 (90 BASE) MCG/ACT inhaler Inhale 1-2 puffs into the lungs every 6 (six) hours as needed for wheezing or shortness of breath. 04/30/14   Ashley Murrain, NP  benzonatate (TESSALON) 100 MG capsule Take 2 capsules (200 mg total) by mouth 3 (three) times daily as needed. 03/17/18   Evalee Jefferson, PA-C  ibuprofen (ADVIL,MOTRIN) 800 MG tablet Take 1 tablet (800 mg total) by mouth 3 (three) times daily. 10/16/17   Triplett, Tammy, PA-C  lisinopril (PRINIVIL,ZESTRIL) 10 MG tablet Take 5 mg by mouth daily.     [provider]  medroxyPROGESTERone Acetate 150 MG/ML SUSY INJECT 1 ML (150MG ) INTRAMUSCULARLY EVERY 3 MONTHS. 02/24/18   Estill Dooms, NP  megestrol (MEGACE) 40 MG tablet Take 1 tablet (40 mg total) by mouth daily. To suppress breakthru bleeding on Depo 10/17/17   Jonnie Kind, MD  metFORMIN (GLUCOPHAGE) 500 MG tablet Take 500 mg by mouth 2 (two) times daily with a meal.    [provider]  pravastatin (PRAVACHOL) 40 MG tablet Take 40 mg by mouth daily.    [provider]  predniSONE (DELTASONE) 10 MG tablet Take 6 tablets day one, 5 tablets day two, 4 tablets day three, 3 tablets day four, 2 tablets day five, then 1 tablet day six 03/17/18   Kinzey Sheriff, Almyra Free, PA-C  sulfamethoxazole-trimethoprim (BACTRIM DS,SEPTRA DS) 800-160 MG tablet Take 1 tablet by mouth 2 (two) times daily. 02/26/18   Florian Buff, MD    Family History Family History  Problem  Relation Age of Onset  . Diabetes Mother   . Hypertension Mother   . Asthma Father   . Bronchitis Daughter   . Asthma Daughter   . Stroke Paternal Grandmother   . Heart attack Paternal Grandmother   . Cancer Paternal Grandmother   . Asthma Son   . Bronchitis Son   . Asthma Daughter   . Bronchitis Daughter   . Asthma Son   . Asthma Other   . Bronchitis Other   . Cancer Cousin     Social History Social History   Tobacco Use  . Smoking status: Never Smoker  . Smokeless tobacco: Never Used  Substance Use Topics  . Alcohol use: No  . Drug use: No     Allergies   Darvocet [propoxyphene n-acetaminophen]; Hydrocodone-acetaminophen; Lactose intolerance (gi); Other; Penicillins; Latex; Lortab [hydrocodone-acetaminophen]; and Percocet [oxycodone-acetaminophen]   Review of Systems Review of Systems  Constitutional: Negative for chills and fever.  HENT: Negative for congestion, ear pain, rhinorrhea, sinus pressure, sore throat, trouble swallowing and voice change.   Eyes: Negative for discharge.  Respiratory: Positive for cough. Negative for shortness of breath, wheezing and stridor.   Cardiovascular: Negative for chest pain.  Gastrointestinal: Negative for abdominal pain.  Genitourinary: Negative.   Musculoskeletal: Positive for back pain.     Physical Exam Updated Vital Signs BP 110/79 (BP Location: Right Arm)   Pulse 77   Temp 98.7 F (37.1 C) (Oral)   Resp 20   Ht 5\' 4"  (1.626 m)   Wt 102.1 kg   SpO2 100%   BMI 38.62 kg/m   Physical Exam Constitutional:      Appearance: She is well-developed.  HENT:     Head: Normocephalic and atraumatic.     Nose: Mucosal edema present. No rhinorrhea.     Mouth/Throat:     Mouth: Mucous membranes are moist.     Pharynx: Oropharynx is clear. Uvula midline. No oropharyngeal exudate or posterior oropharyngeal erythema.     Tonsils: No tonsillar abscesses.  Eyes:     Conjunctiva/sclera: Conjunctivae normal.    Cardiovascular:     Rate and Rhythm: Normal rate and regular rhythm.     Heart sounds: Normal heart sounds.  Pulmonary:     Effort: Pulmonary effort is normal. No respiratory distress.     Breath sounds: Normal breath sounds. No stridor. No wheezing, rhonchi or rales.     Comments: Patient does have some tenderness to palpation along her left lateral upper back.  No rash or erythema.  No  palpable deformity.  Thoracic spine is nontender. Abdominal:     Palpations: Abdomen is soft.     Tenderness: There is no abdominal tenderness.  Musculoskeletal: Normal range of motion.  Skin:    General: Skin is warm and dry.     Findings: No rash.  Neurological:     Mental Status: She is alert and oriented to person, place, and time.      ED Treatments / Results  Labs (all labs ordered are listed, but only abnormal results are displayed) Labs Reviewed - No data to display  EKG None  Radiology Dg Chest 2 View  Result Date: 03/17/2018 CLINICAL DATA:  Cough, chest pain EXAM: CHEST - 2 VIEW COMPARISON:  07/11/2016 FINDINGS: Heart is normal size. Lungs clear. No effusions. Expansile lesion in the posterolateral left 9th rib is unchanged since prior CT. IMPRESSION: No active cardiopulmonary disease. Electronically Signed   By: Rolm Baptise M.D.   On: 03/17/2018 19:31    Procedures Procedures (including critical care time)  Medications Ordered in ED Medications  predniSONE (DELTASONE) tablet 60 mg (has no administration in time range)  benzonatate (TESSALON) capsule 200 mg (has no administration in time range)     Initial Impression / Assessment and Plan / ED Course  I have reviewed the triage vital signs and the nursing notes.  Pertinent labs & imaging results that were available during my care of the patient were reviewed by me and considered in my medical decision making (see chart for details).     Patient was suspected rib cage/body wall pain secondary to frequency of cough.  Her  chest x-ray was reviewed and discussed with patient.  She was placed on Tessalon to help with frequency of coughing.  Encouraged PRN use of her albuterol inhaler.  Prednisone also prescribed for pain relief and inflammation.  Final Clinical Impressions(s) / ED Diagnoses   Final diagnoses:  Acute left-sided thoracic back pain  Acute bronchitis, unspecified organism    ED Discharge Orders         Ordered    benzonatate (TESSALON) 100 MG capsule  3 times daily PRN     03/17/18 2007    predniSONE (DELTASONE) 10 MG tablet     03/17/18 2007           Landis Martins 03/17/18 2009    Milton Ferguson, MD 03/17/18 [phone]

## 2018-05-16 ENCOUNTER — Ambulatory Visit (INDEPENDENT_AMBULATORY_CARE_PROVIDER_SITE_OTHER): Payer: Medicaid Other | Admitting: *Deleted

## 2018-05-16 ENCOUNTER — Other Ambulatory Visit: Payer: Self-pay

## 2018-05-16 ENCOUNTER — Encounter: Payer: Self-pay | Admitting: *Deleted

## 2018-05-16 DIAGNOSIS — Z3042 Encounter for surveillance of injectable contraceptive: Secondary | ICD-10-CM

## 2018-05-16 MED ORDER — MEDROXYPROGESTERONE ACETATE 150 MG/ML IM SUSP
150.0000 mg | Freq: Once | INTRAMUSCULAR | Status: AC
  Administered 2018-05-16: 150 mg via INTRAMUSCULAR

## 2018-05-16 NOTE — Progress Notes (Signed)
Depo Provera 150mg IM given in left deltoid with no complications. Pt to return in 12 weeks for next injection.  

## 2018-08-07 ENCOUNTER — Telehealth: Payer: Self-pay | Admitting: Obstetrics & Gynecology

## 2018-08-07 NOTE — Telephone Encounter (Signed)
Unable to leave message or reach pt with instructions and screening.

## 2018-08-08 ENCOUNTER — Ambulatory Visit: Payer: Medicaid Other

## 2018-08-11 ENCOUNTER — Other Ambulatory Visit: Payer: Self-pay

## 2018-08-11 ENCOUNTER — Ambulatory Visit (HOSPITAL_COMMUNITY)
Admission: RE | Admit: 2018-08-11 | Discharge: 2018-08-11 | Disposition: A | Discharge status: Home / Self Care | Payer: Medicaid Other | Source: Ambulatory Visit | Attending: Internal Medicine | Admitting: Internal Medicine

## 2018-08-11 ENCOUNTER — Other Ambulatory Visit (HOSPITAL_COMMUNITY): Payer: Self-pay | Admitting: Internal Medicine

## 2018-08-11 ENCOUNTER — Ambulatory Visit: Payer: Medicaid Other

## 2018-08-11 DIAGNOSIS — M25562 Pain in left knee: Secondary | ICD-10-CM | POA: Diagnosis not present

## 2018-09-11 ENCOUNTER — Telehealth: Payer: Self-pay | Admitting: Women's Health

## 2018-09-11 NOTE — Telephone Encounter (Signed)
Pt states that she is having thick vaginal discharge and wants to see if she should come in for an appt.

## 2018-09-11 NOTE — Telephone Encounter (Signed)
Call could not be completed.  If patient calls back, offer her to come in for nuswab as we have no appointments available.

## 2018-09-16 ENCOUNTER — Other Ambulatory Visit: Payer: Self-pay

## 2018-09-16 ENCOUNTER — Other Ambulatory Visit (INDEPENDENT_AMBULATORY_CARE_PROVIDER_SITE_OTHER): Payer: Medicaid Other

## 2018-09-16 ENCOUNTER — Other Ambulatory Visit: Payer: Medicaid Other

## 2018-09-16 DIAGNOSIS — Z3202 Encounter for pregnancy test, result negative: Secondary | ICD-10-CM | POA: Diagnosis not present

## 2018-09-16 DIAGNOSIS — R3589 Other polyuria: Secondary | ICD-10-CM

## 2018-09-16 DIAGNOSIS — N898 Other specified noninflammatory disorders of vagina: Secondary | ICD-10-CM

## 2018-09-16 DIAGNOSIS — R358 Other polyuria: Secondary | ICD-10-CM

## 2018-09-16 LAB — POCT URINE PREGNANCY: Preg Test, Ur: NEGATIVE

## 2018-09-16 NOTE — Progress Notes (Signed)
Pt in for nuswab. Also requested preg test and having urinary symptoms.

## 2018-09-16 NOTE — Addendum Note (Signed)
Addended by: Linton Rump on: 09/16/2018 01:45 PM   Modules accepted: Orders

## 2018-09-17 LAB — URINALYSIS, ROUTINE W REFLEX MICROSCOPIC
Bilirubin, UA: NEGATIVE
Glucose, UA: NEGATIVE
Ketones, UA: NEGATIVE
Leukocytes,UA: NEGATIVE
Nitrite, UA: NEGATIVE
RBC, UA: NEGATIVE
Specific Gravity, UA: 1.023 (ref 1.005–1.030)
Urobilinogen, Ur: 1 mg/dL (ref 0.2–1.0)
pH, UA: 7 (ref 5.0–7.5)

## 2018-09-17 LAB — SPECIMEN STATUS REPORT

## 2018-09-18 LAB — SPECIMEN STATUS REPORT

## 2018-09-18 LAB — URINE CULTURE

## 2018-09-22 ENCOUNTER — Telehealth: Payer: Self-pay | Admitting: *Deleted

## 2018-09-22 LAB — NUSWAB VAGINITIS PLUS (VG+)
Candida albicans, NAA: NEGATIVE
Candida glabrata, NAA: NEGATIVE
Chlamydia trachomatis, NAA: NEGATIVE
Neisseria gonorrhoeae, NAA: NEGATIVE
Trich vag by NAA: NEGATIVE

## 2018-09-22 LAB — SPECIMEN STATUS REPORT

## 2018-09-22 NOTE — Telephone Encounter (Signed)
-----   Message from Estill Dooms, NP sent at 09/22/2018  1:45 PM EDT ----- Let Linus Orn know nuswab is all negative

## 2018-09-22 NOTE — Telephone Encounter (Signed)
Pt aware NuSwab was negative. Pt voiced understanding. [place]

## 2018-11-04 ENCOUNTER — Telehealth: Payer: Self-pay | Admitting: *Deleted

## 2018-11-04 NOTE — Telephone Encounter (Signed)
Pt left message stating that she is having nausea and some other issues.

## 2018-11-04 NOTE — Telephone Encounter (Signed)
Pt has been off Depo x 3 months. Pt had sex Sunday. After sex, pt started with nausea and pain in lower back. Pt had a crying spell. No problems urinating. Pt has been depressed for a while. Pt don't feel suicidal or that she could harm anyone. I advised she needs to wait 14 days and then do a urine pregnancy test. Pt voiced understanding. [place]

## 2018-11-05 NOTE — Telephone Encounter (Signed)
Does she want bc?   What does she want to help her with depression. Counsellor,? Has she been on meds? Sounds like a tele visit to me

## 2018-11-06 NOTE — Telephone Encounter (Signed)
Televisit scheduled per Dr. Glo Herring. [place]

## 2018-11-18 ENCOUNTER — Telehealth: Payer: Self-pay | Admitting: *Deleted

## 2018-11-18 NOTE — Telephone Encounter (Signed)
Pt started period Sunday. Got heavy last pm. Not on any birth control. Pt was requesting Megace but I advised it sounds like she is just having her normal period and to give it more time before starting Megace. Advised Megace is usually given when the patient has been bleeding for a long time. Pt voiced understanding. [place]

## 2018-11-19 ENCOUNTER — Ambulatory Visit: Payer: Medicaid Other | Admitting: Women's Health

## 2018-11-21 ENCOUNTER — Telehealth: Payer: Self-pay | Admitting: Women's Health

## 2018-11-21 NOTE — Telephone Encounter (Signed)

## 2018-11-24 ENCOUNTER — Other Ambulatory Visit: Payer: Self-pay

## 2018-11-24 ENCOUNTER — Other Ambulatory Visit (HOSPITAL_COMMUNITY)
Admission: RE | Admit: 2018-11-24 | Discharge: 2018-11-24 | Disposition: A | Discharge status: Home / Self Care | Payer: Medicaid Other | Source: Ambulatory Visit | Attending: Obstetrics & Gynecology | Admitting: Obstetrics & Gynecology

## 2018-11-24 ENCOUNTER — Encounter: Payer: Self-pay | Admitting: Women's Health

## 2018-11-24 ENCOUNTER — Ambulatory Visit (INDEPENDENT_AMBULATORY_CARE_PROVIDER_SITE_OTHER): Payer: Medicaid Other | Admitting: Women's Health

## 2018-11-24 VITALS — BP 127/89 | HR 76 | Ht 64.0 in | Wt 230.0 lb

## 2018-11-24 DIAGNOSIS — Z113 Encounter for screening for infections with a predominantly sexual mode of transmission: Secondary | ICD-10-CM

## 2018-11-24 DIAGNOSIS — N941 Unspecified dyspareunia: Secondary | ICD-10-CM | POA: Diagnosis not present

## 2018-11-24 DIAGNOSIS — Z3009 Encounter for other general counseling and advice on contraception: Secondary | ICD-10-CM | POA: Diagnosis not present

## 2018-11-24 NOTE — Progress Notes (Signed)
   GYN VISIT Patient name: Breanna Malone MRN [id number]  Date of birth: [birth year] Chief Complaint:   Contraception  History of Present Illness:   Breanna Malone is a 38 y.o. [phone] African American female being seen today for painful intercourse last night. No sex since daughter born 5 years ago, had no desire on depo. 82yo husband died from MI 15yrs ago. Stopped depo 39mths ago by recommendation from PCP d/t weight w/ DM. Has had strong sexual desire since. Had unprotected sex for 1st time w/ new partner last night, was painful entire time, afterwards has been having nausea, lower back/pelvic pain. Denies fever/chills. Fears she could be pregnant. Denies abnormal discharge, itching/odor/irritation.    Patient's last menstrual period was 11/17/2018. The current method of family planning is none.  Last pap 03/02/15. Results were:  normal Review of Systems:   Pertinent items are noted in HPI Denies fever/chills, dizziness, headaches, visual disturbances, fatigue, shortness of breath, chest pain, abdominal pain, vomiting, abnormal vaginal discharge/itching/odor/irritation, problems with periods, bowel movements, urination, or intercourse unless otherwise stated above.  Pertinent History Reviewed:  Reviewed past medical,surgical, social, obstetrical and family history.  Reviewed problem list, medications and allergies. Physical Assessment:   Vitals:   11/24/18 1525  BP: 127/89  Pulse: 76  Weight: 230 lb (104.3 kg)  Height: 5\' 4"  (1.626 m)  Body mass index is 39.48 kg/m.       Physical Examination:   General appearance: alert, well appearing, and in no distress  Mental status: alert, oriented to person, place, and time  Skin: warm & dry   Cardiovascular: normal heart rate noted  Respiratory: normal respiratory effort, no distress  Abdomen: soft, non-tender   Pelvic: entire exam pt very tense/tender VULVA: normal appearing vulva with no masses, tenderness or lesions, VAGINA: normal  appearing vagina with normal color and discharge, no lesions, CERVIX: normal appearing cervix without discharge or lesions, UTERUS: uterus is normal size, shape, consistency and nontender, ADNEXA: normal adnexa in size, nontender and no masses  Extremities: no edema   No results found for this or any previous visit (from the past 24 hour(s)).  Assessment & Plan:  1) Painful intercourse x 1> has been 5 years since had sex, was new partner (and 1st partner since husband passed 67yrs ago). Likely sore. Vaginal swab sent. Very unlikely to have conceived last night based on LMP. Can use ibuprofen/aleve, heating pad, etc. Let us know/go to ED if worsening pain, fever/chills, etc. Keep appt in 3d for pap & physical  2) Contraception counseling> undecided, discussed IUD. Abstinence until contraception  3) STD screen  Meds: No orders of the defined types were placed in this encounter.   No orders of the defined types were placed in this encounter.   Return for As scheduled 10/8 for , Pap & physical.  Roma Schanz CNM, WHNP-BC 11/24/2018 4:20 PM

## 2018-11-26 ENCOUNTER — Telehealth: Payer: Self-pay | Admitting: Adult Health

## 2018-11-26 ENCOUNTER — Telehealth: Payer: Self-pay | Admitting: *Deleted

## 2018-11-26 NOTE — Telephone Encounter (Signed)
Pt returning our call  ° °

## 2018-11-26 NOTE — Telephone Encounter (Signed)
Tried to reach the patient to remind her of her appointment/restricitions, not able to leave a message.

## 2018-11-27 ENCOUNTER — Other Ambulatory Visit (HOSPITAL_COMMUNITY): Payer: Self-pay | Admitting: Adult Health

## 2018-11-27 ENCOUNTER — Other Ambulatory Visit (HOSPITAL_COMMUNITY)
Admission: RE | Admit: 2018-11-27 | Discharge: 2018-11-27 | Disposition: A | Discharge status: Home / Self Care | Payer: Medicaid Other | Source: Ambulatory Visit | Attending: Adult Health | Admitting: Adult Health

## 2018-11-27 ENCOUNTER — Ambulatory Visit (INDEPENDENT_AMBULATORY_CARE_PROVIDER_SITE_OTHER): Payer: Medicaid Other | Admitting: Adult Health

## 2018-11-27 ENCOUNTER — Other Ambulatory Visit: Payer: Self-pay

## 2018-11-27 ENCOUNTER — Encounter: Payer: Self-pay | Admitting: Adult Health

## 2018-11-27 VITALS — BP 123/80 | Ht 64.0 in | Wt 230.0 lb

## 2018-11-27 DIAGNOSIS — Z01419 Encounter for gynecological examination (general) (routine) without abnormal findings: Secondary | ICD-10-CM

## 2018-11-27 DIAGNOSIS — Z713 Dietary counseling and surveillance: Secondary | ICD-10-CM | POA: Insufficient documentation

## 2018-11-27 DIAGNOSIS — Z124 Encounter for screening for malignant neoplasm of cervix: Secondary | ICD-10-CM | POA: Insufficient documentation

## 2018-11-27 DIAGNOSIS — Z0000 Encounter for general adult medical examination without abnormal findings: Secondary | ICD-10-CM | POA: Diagnosis not present

## 2018-11-27 DIAGNOSIS — Z1212 Encounter for screening for malignant neoplasm of rectum: Secondary | ICD-10-CM | POA: Diagnosis not present

## 2018-11-27 DIAGNOSIS — Z6839 Body mass index (BMI) 39.0-39.9, adult: Secondary | ICD-10-CM | POA: Insufficient documentation

## 2018-11-27 DIAGNOSIS — Z1231 Encounter for screening mammogram for malignant neoplasm of breast: Secondary | ICD-10-CM

## 2018-11-27 DIAGNOSIS — Z1211 Encounter for screening for malignant neoplasm of colon: Secondary | ICD-10-CM | POA: Diagnosis not present

## 2018-11-27 LAB — HEMOCCULT GUIAC POC 1CARD (OFFICE): Fecal Occult Blood, POC: NEGATIVE

## 2018-11-27 MED ORDER — PHENTERMINE HCL 37.5 MG PO TABS: 37.5000 mg | ORAL_TABLET | Freq: Every day | ORAL | 0 refills | Status: DC

## 2018-11-27 NOTE — Progress Notes (Addendum)
Patient ID: Breanna Malone, female   DOB: [birth year], 38 y.o.   MRN: [id number] History of Present Illness: Breanna Malone is a 38 year old black female, [id number], in for a well woman gyn exam and pap. PCP is Dr Legrand Rams.   Current Medications, Allergies, Past Medical History, Past Surgical History, Family History and Social History were reviewed in [employer] record.     Review of Systems: Patient denies any headaches, hearing loss, fatigue, blurred vision, shortness of breath, chest pain, abdominal pain, problems with bowel movements, urination, or intercourse(hurts at times). No joint pain or mood swings. Has noticed increased sex drive, has been off depo about 4 months. Can't lose weight, wants help   Physical Exam:BP 123/80 (BP Location: Left Arm, Patient Position: Sitting, Cuff Size: Large)   Ht 5\' 4"  (1.626 m)   Wt 230 lb (104.3 kg)   LMP 11/17/2018   BMI 39.48 kg/m  General:  Well developed, well nourished, no acute distress Skin:  Warm and dry Neck:  Midline trachea, normal thyroid, good ROM, no lymphadenopathy Lungs; Clear to auscultation bilaterally Breast:  No dominant palpable mass, retraction, or nipple discharge Cardiovascular: Regular rate and rhythm Abdomen:  Soft, non tender, no hepatosplenomegaly Pelvic:  External genitalia is normal in appearance, no lesions.  The vagina is normal in appearance. Urethra has no lesions or masses. The cervix is bulbous. Pap with GC/CHL and high risk HPV with 16/18 genotyping performed. Uterus is felt to be normal size, shape, and contour.  No adnexal masses or tenderness noted.Bladder is non tender, no masses felt. Rectal: Good sphincter tone, no polyps, or hemorrhoids felt.  Hemoccult negative. Extremities/musculoskeletal:  No swelling or varicosities noted, no clubbing or cyanosis Psych:  No mood changes, alert and cooperative,seems happy Fall risk is low pHQ 2 score is 1. Examination chaperoned by [facility]  LPN.   Impression and Plan: 1. Routine cervical smear -pap sent  2. Encounter for gynecological examination with Papanicolaou smear of cervix -physical in 1 year -Pap in 3 if normal -mammogram scheduled for her 12/15/2018 at [facility] at 2:30 pm -get flu shot  -use condoms   3. Screening for colorectal cancer  4. Weight loss counseling, encounter for -Eat small meals -Decrease sweets, bread, potatoes and pasta -No sweet tea or sodas,  I-ncrease water -Walk 30 minutes every day  -will rx adipex Meds ordered this encounter  Medications  . phentermine (ADIPEX-P) 37.5 MG tablet    Sig: Take 1 tablet (37.5 mg total) by mouth daily before breakfast.    Dispense:  30 tablet    Refill:  0    Order Specific Question:   Supervising Provider    Answer:   Elonda Husky, LUTHER H [[id number]]    5. Body mass index 39.0-39.9, adult

## 2018-11-27 NOTE — Patient Instructions (Signed)
Eat small meals Decrease sweets, bread, potatoes and pasta No sweet tea or sodas,  Increase water Walk 30 minutes every day

## 2018-12-03 ENCOUNTER — Telehealth: Payer: Self-pay | Admitting: *Deleted

## 2018-12-03 ENCOUNTER — Other Ambulatory Visit: Payer: Self-pay | Admitting: Women's Health

## 2018-12-03 LAB — CERVICOVAGINAL ANCILLARY ONLY
Bacterial Vaginitis (gardnerella): POSITIVE — AB
Candida Glabrata: NEGATIVE
Candida Vaginitis: NEGATIVE
Chlamydia: NEGATIVE
Comment: NEGATIVE
Comment: NEGATIVE
Comment: NEGATIVE
Comment: NEGATIVE
Comment: NEGATIVE
Comment: NORMAL
Neisseria Gonorrhea: NEGATIVE
Trichomonas: NEGATIVE

## 2018-12-03 MED ORDER — METRONIDAZOLE 500 MG PO TABS: 500.0000 mg | ORAL_TABLET | Freq: Two times a day (BID) | ORAL | 0 refills | Status: DC

## 2018-12-03 NOTE — Telephone Encounter (Signed)
Patient returned called. Advised patient that culture showed bacterial vaginosis and medication was called into [place]. Patient voiced understanding.

## 2018-12-03 NOTE — Telephone Encounter (Signed)
Telephoned patient at home number and mailbox is full unable to leave message.  

## 2018-12-08 LAB — CYTOLOGY - PAP
Adequacy: ABSENT
Chlamydia: NEGATIVE
Comment: NEGATIVE
Comment: NEGATIVE
Comment: NORMAL
Diagnosis: NEGATIVE
High risk HPV: NEGATIVE
Neisseria Gonorrhea: NEGATIVE

## 2018-12-13 ENCOUNTER — Emergency Department (HOSPITAL_COMMUNITY)
Admission: EM | Admit: 2018-12-13 | Discharge: 2018-12-13 | Disposition: A | Discharge status: Home / Self Care | Payer: Medicaid Other | Attending: Emergency Medicine | Admitting: Emergency Medicine

## 2018-12-13 ENCOUNTER — Other Ambulatory Visit: Payer: Self-pay

## 2018-12-13 ENCOUNTER — Encounter (HOSPITAL_COMMUNITY): Payer: Self-pay | Admitting: Emergency Medicine

## 2018-12-13 DIAGNOSIS — J45909 Unspecified asthma, uncomplicated: Secondary | ICD-10-CM | POA: Diagnosis not present

## 2018-12-13 DIAGNOSIS — Z91040 Latex allergy status: Secondary | ICD-10-CM | POA: Diagnosis not present

## 2018-12-13 DIAGNOSIS — Z79899 Other long term (current) drug therapy: Secondary | ICD-10-CM | POA: Insufficient documentation

## 2018-12-13 DIAGNOSIS — R5383 Other fatigue: Secondary | ICD-10-CM | POA: Diagnosis present

## 2018-12-13 DIAGNOSIS — Z7984 Long term (current) use of oral hypoglycemic drugs: Secondary | ICD-10-CM | POA: Insufficient documentation

## 2018-12-13 DIAGNOSIS — N390 Urinary tract infection, site not specified: Secondary | ICD-10-CM

## 2018-12-13 DIAGNOSIS — Z20828 Contact with and (suspected) exposure to other viral communicable diseases: Secondary | ICD-10-CM | POA: Diagnosis not present

## 2018-12-13 LAB — COMPREHENSIVE METABOLIC PANEL
ALT: 28 U/L (ref 0–44)
AST: 26 U/L (ref 15–41)
Albumin: 4.3 g/dL (ref 3.5–5.0)
Alkaline Phosphatase: 144 U/L — ABNORMAL HIGH (ref 38–126)
Anion gap: 7 (ref 5–15)
BUN: 10 mg/dL (ref 6–20)
CO2: 25 mmol/L (ref 22–32)
Calcium: 9.2 mg/dL (ref 8.9–10.3)
Chloride: 106 mmol/L (ref 98–111)
Creatinine, Ser: 0.64 mg/dL (ref 0.44–1.00)
GFR calc Af Amer: >60 mL/min (ref >60)
GFR calc non Af Amer: >60 mL/min (ref >60)
Glucose, Bld: 111 mg/dL — ABNORMAL HIGH (ref 70–99)
Potassium: 3.5 mmol/L (ref 3.5–5.1)
Sodium: 138 mmol/L (ref 135–145)
Total Bilirubin: 0.8 mg/dL (ref 0.3–1.2)
Total Protein: 7.3 g/dL (ref 6.5–8.1)

## 2018-12-13 LAB — CBC WITH DIFFERENTIAL/PLATELET
Abs Immature Granulocytes: 0.01 10*3/uL (ref 0.00–0.07)
Basophils Absolute: 0 10*3/uL (ref 0.0–0.1)
Basophils Relative: 0 %
Eosinophils Absolute: 0.1 10*3/uL (ref 0.0–0.5)
Eosinophils Relative: 1 %
HCT: 38.6 % (ref 36.0–46.0)
Hemoglobin: 12.3 g/dL (ref 12.0–15.0)
Immature Granulocytes: 0 %
Lymphocytes Relative: 44 %
Lymphs Abs: 3.2 10*3/uL (ref 0.7–4.0)
MCH: 30.8 pg (ref 26.0–34.0)
MCHC: 31.9 g/dL (ref 30.0–36.0)
MCV: 96.7 fL (ref 80.0–100.0)
Monocytes Absolute: 0.4 10*3/uL (ref 0.1–1.0)
Monocytes Relative: 6 %
Neutro Abs: 3.6 10*3/uL (ref 1.7–7.7)
Neutrophils Relative %: 49 %
Platelets: 320 10*3/uL (ref 150–400)
RBC: 3.99 MIL/uL (ref 3.87–5.11)
RDW: 11 % — ABNORMAL LOW (ref 11.5–15.5)
WBC: 7.4 10*3/uL (ref 4.0–10.5)
nRBC: 0 % (ref 0.0–0.2)

## 2018-12-13 LAB — URINALYSIS, ROUTINE W REFLEX MICROSCOPIC
Bilirubin Urine: NEGATIVE
Glucose, UA: NEGATIVE mg/dL
Ketones, ur: NEGATIVE mg/dL
Nitrite: NEGATIVE
Protein, ur: NEGATIVE mg/dL
Specific Gravity, Urine: 1.023 (ref 1.005–1.030)
pH: 6 (ref 5.0–8.0)

## 2018-12-13 LAB — HCG, QUANTITATIVE, PREGNANCY: hCG, Beta Chain, Quant, S: 3 m[IU]/mL (ref <5)

## 2018-12-13 MED ORDER — CEPHALEXIN 500 MG PO CAPS
500.0000 mg | ORAL_CAPSULE | Freq: Once | ORAL | Status: AC
  Administered 2018-12-13: 17:00:00 500 mg via ORAL
  Filled 2018-12-13: qty 1

## 2018-12-13 MED ORDER — CEPHALEXIN 500 MG PO CAPS: 500.0000 mg | ORAL_CAPSULE | Freq: Four times a day (QID) | ORAL | 0 refills | Status: DC

## 2018-12-13 NOTE — ED Triage Notes (Signed)
Patient c/o fatigue with nausea, vomiting, and lower back pain. Denies any fevers or urinary symptoms. Patient concerned she may be pregnant but has not taken test. Last period was 11/13/2018.

## 2018-12-13 NOTE — ED Notes (Signed)
Patient is unable to urinate at this time 

## 2018-12-13 NOTE — ED Provider Notes (Signed)
[facility] EMERGENCY DEPARTMENT Provider Note   CSN: [id number] Arrival date & time: 12/13/18  1536     History   Chief Complaint Chief Complaint  Patient presents with  . Fatigue    HPI Breanna Malone is a 38 y.o. female.     Patient is a 38 year old female who presents to the emergency department with a complaint of fatigue.  The patient states this problem started on about the 17th or 18 October.  The patient states she just feels overall fatigued.  This is been accompanied by nausea, vomiting, and lower back pain.  Patient states she was concerned that she may be pregnant.  She has not taken a pregnancy test.  The patient states she has never been diagnosed with anemia.  She does not have diabetes as well as best she knows.  She has not been diagnosed with heart or lung related issues.  Patient denies urinary symptoms.  Patient states she has had some soreness of her breast.  She says that she has not had a menstrual cycle since September 24.  50 patient states she has not been around anyone with a known diagnosis of the COVID-19 virus.  She has not been traveling recently.  And has not been in any gatherings recently.  Patient states that she was seen earlier during the week by her primary physician, they did not find anything wrong at that time.  She presents now for additional evaluation concerning her fatigue.  The history is provided by the patient.    Past Medical History:  Diagnosis Date  . Abnormal Pap smear   . Arthritis   . Asthma   . Asthma   . Bipolar 1 disorder ([place])   . Bronchitis   . Carpal tunnel syndrome   . Cervical spine pain   . Chest pain   . Elevated cholesterol   . High cholesterol   . Hyperlipidemia   . Hyperlipoproteinemia   . Post traumatic stress disorder   . Right ovarian cyst 10/31/2012   Complex right ovarian cyst seen 8/18 in ER need f/u US  . Tendonitis   . Vaginal Pap smear, abnormal     Patient Active Problem List   Diagnosis  Date Noted  . Screening for colorectal cancer 11/27/2018  . Encounter for gynecological examination with Papanicolaou smear of cervix 11/27/2018  . Routine cervical smear 11/27/2018  . Body mass index 39.0-39.9, adult 11/27/2018  . Weight loss counseling, encounter for 11/27/2018  . Carpal tunnel syndrome of right wrist   . Condyloma acuminatum in female 02/24/2014  . URI (upper respiratory infection) 02/18/2013  . Abnormal Pap smear of cervix 11/24/2012  . Asthma 11/24/2012  . Right ovarian cyst 10/31/2012  . Bipolar 1 disorder ([place]) 10/31/2012    Past Surgical History:  Procedure Laterality Date  . CARPAL TUNNEL RELEASE Right 06/28/2016   Procedure: RIGHT CARPAL TUNNEL RELEASE;  Surgeon: Carole Civil, MD;  Location: [facility];  Service: Orthopedics;  Laterality: Right;  . HERNIA REPAIR     umbilical     OB History    Gravida  7   Para  6   Term  6   Preterm      AB  1   Living  4     SAB  1   TAB      Ectopic      Multiple      Live Births  6  Home Medications    Prior to Admission medications   Medication Sig Start Date End Date Taking? Authorizing Provider  lisinopril (PRINIVIL,ZESTRIL) 10 MG tablet Take 5 mg by mouth daily.     [provider]  metFORMIN (GLUCOPHAGE) 500 MG tablet Take 500 mg by mouth 2 (two) times daily with a meal.    [provider]  metroNIDAZOLE (FLAGYL) 500 MG tablet Take 1 tablet (500 mg total) by mouth 2 (two) times daily. 12/03/18   Roma Schanz, CNM  phentermine (ADIPEX-P) 37.5 MG tablet Take 1 tablet (37.5 mg total) by mouth daily before breakfast. 11/27/18   Estill Dooms, NP  pravastatin (PRAVACHOL) 40 MG tablet Take 40 mg by mouth daily.    [provider]    Family History Family History  Problem Relation Age of Onset  . Diabetes Mother   . Hypertension Mother   . Asthma Father   . Bronchitis Daughter   . Asthma Daughter   . Stroke Paternal Grandmother   .  Heart attack Paternal Grandmother   . Cancer Paternal Grandmother   . Asthma Son   . Bronchitis Son   . Asthma Daughter   . Bronchitis Daughter   . Asthma Son   . Asthma Other   . Bronchitis Other   . Cancer Cousin     Social History Social History   Tobacco Use  . Smoking status: Never Smoker  . Smokeless tobacco: Never Used  Substance Use Topics  . Alcohol use: No  . Drug use: No     Allergies   Darvocet [propoxyphene n-acetaminophen], Hydrocodone-acetaminophen, Lactose intolerance (gi), Other, Penicillins, Latex, Lortab [hydrocodone-acetaminophen], and Percocet [oxycodone-acetaminophen]   Review of Systems Review of Systems  Constitutional: Positive for fatigue. Negative for activity change and appetite change.  HENT: Negative for congestion, ear discharge, ear pain, facial swelling, nosebleeds, rhinorrhea, sneezing and tinnitus.   Eyes: Negative for photophobia, pain and discharge.  Respiratory: Negative for cough, choking, shortness of breath and wheezing.   Cardiovascular: Negative for chest pain, palpitations and leg swelling.  Gastrointestinal: Positive for nausea and vomiting. Negative for abdominal pain, blood in stool, constipation and diarrhea.  Genitourinary: Negative for difficulty urinating, dysuria, flank pain, frequency and hematuria.  Musculoskeletal: Negative for back pain, gait problem, myalgias and neck pain.  Skin: Negative for color change, rash and wound.  Neurological: Negative for dizziness, seizures, syncope, facial asymmetry, speech difficulty, weakness and numbness.  Hematological: Negative for adenopathy. Does not bruise/bleed easily.  Psychiatric/Behavioral: Negative for agitation, confusion, hallucinations, self-injury and suicidal ideas. The patient is not nervous/anxious.      Physical Exam Updated Vital Signs BP 129/89   Pulse 94   Temp 98.9 F (37.2 C)   Resp 20   Ht 5\' 4"  (1.626 m)   Wt 103.5 kg   LMP 11/13/2018   SpO2 95%    BMI 39.15 kg/m   Physical Exam Vitals signs and nursing note reviewed.  Constitutional:      Appearance: She is well-developed. She is not toxic-appearing.  HENT:     Head: Normocephalic.     Right Ear: Tympanic membrane and external ear normal.     Left Ear: Tympanic membrane and external ear normal.  Eyes:     General: Lids are normal.     Pupils: Pupils are equal, round, and reactive to light.  Neck:     Musculoskeletal: Normal range of motion and neck supple.     Vascular: No carotid bruit.  Cardiovascular:  Rate and Rhythm: Normal rate and regular rhythm.     Pulses: Normal pulses.     Heart sounds: Normal heart sounds.  Pulmonary:     Effort: No respiratory distress.     Breath sounds: Normal breath sounds. No stridor. No rhonchi.  Abdominal:     General: Bowel sounds are normal.     Palpations: Abdomen is soft.     Tenderness: There is no abdominal tenderness. There is no guarding.  Musculoskeletal: Normal range of motion.  Lymphadenopathy:     Head:     Right side of head: No submandibular adenopathy.     Left side of head: No submandibular adenopathy.     Cervical: No cervical adenopathy.  Skin:    General: Skin is warm and dry.  Neurological:     Mental Status: She is alert and oriented to person, place, and time.     Cranial Nerves: No cranial nerve deficit.     Sensory: No sensory deficit.  Psychiatric:        Speech: Speech normal.      ED Treatments / Results  Labs (all labs ordered are listed, but only abnormal results are displayed) Labs Reviewed - No data to display  EKG None  Radiology No results found.  Procedures Procedures (including critical care time)  Medications Ordered in ED Medications - No data to display   Initial Impression / Assessment and Plan / ED Course  I have reviewed the triage vital signs and the nursing notes.  Pertinent labs & imaging results that were available during my care of the patient were reviewed by  me and considered in my medical decision making (see chart for details).          Final Clinical Impressions(s) / ED Diagnoses MDM Patient presents with generalized fatigue.  She says she has been having some nausea, vomiting, and back pain.  She has had some breast soreness.  She has not had a menstrual cycle since September 24.  Quantitative hCG was 3.  Negative for pregnancy. Urine analysis shows a hazy yellow specimen with a specific gravity of 1.023.  There is a small amount of blood present on the dipstick.  There is a small leukocyte esterase.  There is 6-10 white blood cells and a few bacteria present.  A culture has been sent to the lab.  Patient will be started on Keflex for urinary tract infection. Comprehensive metabolic panel shows the glucose to be elevated at 111 Otherwise the comprehensive metabolic panel is within normal limits. The complete blood count is nonacute.  I have instructed the patient on the results of the examination as well as the results of the laboratory evaluation.  I have asked the patient to increase fluids.  Prescription for Keflex given.  I have asked the patient to see the primary physician or return to the emergency department if any changes in examination or changes in condition.  COVID-19 test has been obtained.  The patient will quarantine until this test has returned.   Final diagnoses:  Fatigue, unspecified type  Urinary tract infection without hematuria, site unspecified    ED Discharge Orders    None       Lily Kocher, PA-C 12/13/18 1752    Milton Ferguson, MD 12/14/18 1816

## 2018-12-13 NOTE — Discharge Instructions (Addendum)
Your pregnancy test is negative.  Your labs are all within normal limits with the exception of your urine test which suggest a urinary tract infection.  Please increase your fluids.  Please use Keflex with breakfast, lunch, dinner, and at bedtime.  Please see your primary physician for recheck of your urine in 5 to 7 days.

## 2018-12-14 LAB — SARS CORONAVIRUS 2 (TAT 6-24 HRS): SARS Coronavirus 2: NEGATIVE

## 2018-12-15 ENCOUNTER — Ambulatory Visit (HOSPITAL_COMMUNITY): Payer: Medicaid Other

## 2018-12-15 ENCOUNTER — Telehealth: Payer: Self-pay | Admitting: *Deleted

## 2018-12-15 LAB — URINE CULTURE: Culture: NO GROWTH

## 2018-12-15 NOTE — Telephone Encounter (Signed)
Patient states she has a brown discharge and wants to be "checked".  She is concerned there is something wrong with her.

## 2018-12-16 NOTE — Telephone Encounter (Signed)
Called patient back she says that she called yesterday and her period started yesterday. She was having some breast tenderness and having some pelvic pain. She wanted to know why her period was late. I told her that it was ok that it was late and that it just happens sometimes.

## 2018-12-24 ENCOUNTER — Telehealth: Payer: Self-pay | Admitting: Adult Health

## 2018-12-24 NOTE — Telephone Encounter (Signed)

## 2018-12-25 ENCOUNTER — Other Ambulatory Visit: Payer: Medicaid Other | Admitting: Adult Health

## 2019-01-07 ENCOUNTER — Telehealth: Payer: Self-pay | Admitting: Obstetrics & Gynecology

## 2019-01-07 NOTE — Telephone Encounter (Signed)
Tried to reach the patient to remind her of her appointment/restrictions, mailbox is full for one # and the other # stated call can not be completed at this time.

## 2019-01-08 ENCOUNTER — Other Ambulatory Visit: Payer: Medicaid Other | Admitting: Obstetrics & Gynecology

## 2019-02-09 ENCOUNTER — Telehealth: Payer: Self-pay | Admitting: *Deleted

## 2019-02-09 NOTE — Telephone Encounter (Signed)
Patient left message that she would like to have a physical exam to make sure that she is ok. Her period started 1 week early and she is extremely concerned about that.

## 2019-02-10 NOTE — Telephone Encounter (Addendum)
Had sex on 12/13 and had spotting afterwards and the next day, her period started. Breasts are sore. I advised to take a pregnancy test just to be safe. Pt voiced understanding and asked for pills to help a bladder infection. I advised we can't order meds for a bladder infection without checking her urine. Pt will continue drinking plenty of water and will call if anything changes. [place]

## 2019-02-17 ENCOUNTER — Other Ambulatory Visit: Payer: Self-pay

## 2019-02-17 ENCOUNTER — Ambulatory Visit (INDEPENDENT_AMBULATORY_CARE_PROVIDER_SITE_OTHER): Payer: Medicaid Other | Admitting: Women's Health

## 2019-02-17 ENCOUNTER — Encounter: Payer: Self-pay | Admitting: Women's Health

## 2019-02-17 ENCOUNTER — Other Ambulatory Visit: Payer: Self-pay | Admitting: Women's Health

## 2019-02-17 VITALS — BP 124/80 | HR 81 | Ht 64.0 in | Wt 224.0 lb

## 2019-02-17 DIAGNOSIS — R102 Pelvic and perineal pain: Secondary | ICD-10-CM

## 2019-02-17 DIAGNOSIS — N3001 Acute cystitis with hematuria: Secondary | ICD-10-CM

## 2019-02-17 DIAGNOSIS — N644 Mastodynia: Secondary | ICD-10-CM

## 2019-02-17 DIAGNOSIS — Z3202 Encounter for pregnancy test, result negative: Secondary | ICD-10-CM

## 2019-02-17 LAB — POCT URINALYSIS DIPSTICK
Glucose, UA: NEGATIVE
Ketones, UA: NEGATIVE
Leukocytes, UA: NEGATIVE
Nitrite, UA: POSITIVE
Protein, UA: POSITIVE — AB

## 2019-02-17 LAB — POCT URINE PREGNANCY: Preg Test, Ur: NEGATIVE

## 2019-02-17 MED ORDER — NITROFURANTOIN MONOHYD MACRO 100 MG PO CAPS: 100.0000 mg | ORAL_CAPSULE | Freq: Two times a day (BID) | ORAL | 0 refills | Status: DC

## 2019-02-17 NOTE — Progress Notes (Signed)
   GYN VISIT Patient name: Breanna Malone MRN [id number]  Date of birth: 28-Mar-1980 Chief Complaint:   breast tenderness and pelvic pressure  History of Present Illness:   Breanna Malone is a 38 y.o. [id number] African American female being seen today for report of bilateral breast tenderness, pelvic pressure. LNMP 11/21. Started early 12/13. Had unprotected sex inbetween. Not wanting to get pregnant. Had been on depo, but her MD wanted her to stop d/t weight gain, lost 40lbs on adipex.      Patient's last menstrual period was 01/10/2019. The current method of family planning is none.  Last pap 02/26/18. Results were:  normal Review of Systems:   Pertinent items are noted in HPI Denies fever/chills, dizziness, headaches, visual disturbances, fatigue, shortness of breath, chest pain, abdominal pain, vomiting, abnormal vaginal discharge/itching/odor/irritation, problems with periods, bowel movements, urination, or intercourse unless otherwise stated above.  Pertinent History Reviewed:  Reviewed past medical,surgical, social, obstetrical and family history.  Reviewed problem list, medications and allergies. Physical Assessment:   Vitals:   02/17/19 1201  BP: 124/80  Pulse: 81  Weight: 224 lb (101.6 kg)  Height: 5\' 4"  (1.626 m)  Body mass index is 38.45 kg/m.       Physical Examination:   General appearance: alert, well appearing, and in no distress  Mental status: alert, oriented to person, place, and time  Skin: warm & dry   Cardiovascular: normal heart rate noted  Respiratory: normal respiratory effort, no distress  Breasts: normal bilaterally, no abnormal masses, skin changes, no tenderness to palpation  Abdomen: soft, non-tender   Pelvic: examination not indicated  Extremities: no edema   Chaperone: Alice Rieger    Results for orders placed or performed in visit on 02/17/19 (from the past 24 hour(s))  POCT urine pregnancy   Collection Time: 02/17/19 12:13 PM  Result Value  Ref Range   Preg Test, Ur Negative Negative  POCT Urinalysis Dipstick   Collection Time: 02/17/19 12:14 PM  Result Value Ref Range   Color, UA     Clarity, UA     Glucose, UA Negative Negative   Bilirubin, UA     Ketones, UA neg    Spec Grav, UA     Blood, UA trace    pH, UA     Protein, UA Positive (A) Negative   Urobilinogen, UA     Nitrite, UA positive    Leukocytes, UA Negative Negative   Appearance     Odor      Assessment & Plan:  1) Bilateral breast tenderness> neg preg test, will check hcg  2) UTI> rx macrobid, wait til tomorrow to take in case hcg+ would switch to keflex  Meds:  Meds ordered this encounter  Medications  . nitrofurantoin, macrocrystal-monohydrate, (MACROBID) 100 MG capsule    Sig: Take 1 capsule (100 mg total) by mouth 2 (two) times daily. X 7 days    Dispense:  14 capsule    Refill:  0    Order Specific Question:   Supervising Provider    Answer:   Tania Ade H [[id number]]    Orders Placed This Encounter  Procedures  . Urine culture  . B-HCG Quant  . POCT urine pregnancy  . POCT Urinalysis Dipstick    No follow-ups on file.  [place], [facility] 02/17/2019 12:55 PM

## 2019-02-18 ENCOUNTER — Telehealth: Payer: Self-pay | Admitting: *Deleted

## 2019-02-18 LAB — BETA HCG QUANT (REF LAB): hCG Quant: 3 m[IU]/mL

## 2019-02-18 NOTE — Telephone Encounter (Signed)
Patient informed HCG 3 and advised to come on Monday for repeat blood work.  Also advised to start antibiotic as prescribed.  Pt verbalized understanding and agreeable to plan.

## 2019-02-20 LAB — URINE CULTURE

## 2019-02-23 ENCOUNTER — Other Ambulatory Visit: Payer: Self-pay

## 2019-04-22 ENCOUNTER — Ambulatory Visit: Payer: Medicaid Other | Admitting: Adult Health

## 2019-04-29 ENCOUNTER — Telehealth: Payer: Self-pay | Admitting: Adult Health

## 2019-04-29 NOTE — Telephone Encounter (Signed)

## 2019-04-30 ENCOUNTER — Encounter: Payer: Self-pay | Admitting: Adult Health

## 2019-04-30 ENCOUNTER — Ambulatory Visit (INDEPENDENT_AMBULATORY_CARE_PROVIDER_SITE_OTHER): Payer: Medicaid Other | Admitting: Adult Health

## 2019-04-30 ENCOUNTER — Other Ambulatory Visit: Payer: Self-pay

## 2019-04-30 VITALS — BP 149/94 | HR 85 | Ht 64.0 in | Wt 227.0 lb

## 2019-04-30 DIAGNOSIS — Z6838 Body mass index (BMI) 38.0-38.9, adult: Secondary | ICD-10-CM | POA: Insufficient documentation

## 2019-04-30 DIAGNOSIS — Z713 Dietary counseling and surveillance: Secondary | ICD-10-CM | POA: Insufficient documentation

## 2019-04-30 MED ORDER — PHENTERMINE HCL 37.5 MG PO TABS: 37.5000 mg | ORAL_TABLET | Freq: Every day | ORAL | 0 refills | Status: DC

## 2019-04-30 NOTE — Progress Notes (Signed)
  Subjective:     Patient ID: Breanna Malone, female   DOB: [birth year], 39 y.o.   MRN: [id number]  HPI Breanna Malone is a 39 year old black female,separaed, [id number], in want to try adipex again last had in October. PCP is Dr Legrand Rams.   Review of Systems Can't lose weight, has been stressed and has eaten junk she says Reviewed past medical,surgical, social and family history. Reviewed medications and allergies.     Objective:   Physical Exam BP (!) 149/94 (BP Location: Left Wrist, Patient Position: Sitting, Cuff Size: Normal)   Pulse 85   Ht 5\' 4"  (1.626 m)   Wt 227 lb (103 kg)   LMP 04/26/2019 (Approximate)   BMI 38.96 kg/m she did not take BP pill this am Skin warm and dry.  Lungs: clear to ausculation bilaterally. Cardiovascular: regular rate and rhythm.   PHQ 9 score is 4,denies any SI just stressed Fall risk is low  Assessment:     1. Weight loss counseling, encounter for Increase walking Increase water Decrease eating junk food  Will rx adpex Meds ordered this encounter  Medications  . phentermine (ADIPEX-P) 37.5 MG tablet    Sig: Take 1 tablet (37.5 mg total) by mouth daily before breakfast.    Dispense:  30 tablet    Refill:  0    Order Specific Question:   Supervising Provider    Answer:   Elonda Husky, LUTHER H [[id number]]   2. Body mass index 38.0-38.9, adult    Plan:     Follow up in 4 weeks for weight and BP check Take BP meds every day

## 2019-06-30 ENCOUNTER — Emergency Department (HOSPITAL_COMMUNITY)
Admission: EM | Admit: 2019-06-30 | Discharge: 2019-06-30 | Disposition: A | Discharge status: Home / Self Care | Payer: Medicaid Other | Attending: Emergency Medicine | Admitting: Emergency Medicine

## 2019-06-30 ENCOUNTER — Encounter (HOSPITAL_COMMUNITY): Payer: Self-pay

## 2019-06-30 ENCOUNTER — Other Ambulatory Visit: Payer: Self-pay

## 2019-06-30 DIAGNOSIS — R110 Nausea: Secondary | ICD-10-CM | POA: Diagnosis not present

## 2019-06-30 DIAGNOSIS — I10 Essential (primary) hypertension: Secondary | ICD-10-CM | POA: Diagnosis not present

## 2019-06-30 DIAGNOSIS — Z79899 Other long term (current) drug therapy: Secondary | ICD-10-CM | POA: Insufficient documentation

## 2019-06-30 DIAGNOSIS — R609 Edema, unspecified: Secondary | ICD-10-CM | POA: Insufficient documentation

## 2019-06-30 DIAGNOSIS — N3001 Acute cystitis with hematuria: Secondary | ICD-10-CM | POA: Diagnosis not present

## 2019-06-30 DIAGNOSIS — N3000 Acute cystitis without hematuria: Secondary | ICD-10-CM

## 2019-06-30 DIAGNOSIS — M7989 Other specified soft tissue disorders: Secondary | ICD-10-CM | POA: Diagnosis present

## 2019-06-30 DIAGNOSIS — Z7984 Long term (current) use of oral hypoglycemic drugs: Secondary | ICD-10-CM | POA: Diagnosis not present

## 2019-06-30 LAB — URINALYSIS, ROUTINE W REFLEX MICROSCOPIC
Bilirubin Urine: NEGATIVE
Glucose, UA: NEGATIVE mg/dL
Hgb urine dipstick: NEGATIVE
Ketones, ur: NEGATIVE mg/dL
Nitrite: POSITIVE — AB
Protein, ur: 30 mg/dL — AB
Specific Gravity, Urine: 1.021 (ref 1.005–1.030)
pH: 6 (ref 5.0–8.0)

## 2019-06-30 LAB — COMPREHENSIVE METABOLIC PANEL
ALT: 29 U/L (ref 0–44)
AST: 25 U/L (ref 15–41)
Albumin: 4 g/dL (ref 3.5–5.0)
Alkaline Phosphatase: 157 U/L — ABNORMAL HIGH (ref 38–126)
Anion gap: 9 (ref 5–15)
BUN: 8 mg/dL (ref 6–20)
CO2: 24 mmol/L (ref 22–32)
Calcium: 9 mg/dL (ref 8.9–10.3)
Chloride: 105 mmol/L (ref 98–111)
Creatinine, Ser: 0.74 mg/dL (ref 0.44–1.00)
GFR calc Af Amer: >60 mL/min (ref >60)
GFR calc non Af Amer: >60 mL/min (ref >60)
Glucose, Bld: 117 mg/dL — ABNORMAL HIGH (ref 70–99)
Potassium: 3.6 mmol/L (ref 3.5–5.1)
Sodium: 138 mmol/L (ref 135–145)
Total Bilirubin: 0.5 mg/dL (ref 0.3–1.2)
Total Protein: 7.2 g/dL (ref 6.5–8.1)

## 2019-06-30 LAB — CBC WITH DIFFERENTIAL/PLATELET
Abs Immature Granulocytes: 0.03 10*3/uL (ref 0.00–0.07)
Basophils Absolute: 0 10*3/uL (ref 0.0–0.1)
Basophils Relative: 0 %
Eosinophils Absolute: 0.1 10*3/uL (ref 0.0–0.5)
Eosinophils Relative: 1 %
HCT: 38.3 % (ref 36.0–46.0)
Hemoglobin: 11.8 g/dL — ABNORMAL LOW (ref 12.0–15.0)
Immature Granulocytes: 0 %
Lymphocytes Relative: 37 %
Lymphs Abs: 3.8 10*3/uL (ref 0.7–4.0)
MCH: 30.1 pg (ref 26.0–34.0)
MCHC: 30.8 g/dL (ref 30.0–36.0)
MCV: 97.7 fL (ref 80.0–100.0)
Monocytes Absolute: 0.6 10*3/uL (ref 0.1–1.0)
Monocytes Relative: 6 %
Neutro Abs: 5.8 10*3/uL (ref 1.7–7.7)
Neutrophils Relative %: 56 %
Platelets: 368 10*3/uL (ref 150–400)
RBC: 3.92 MIL/uL (ref 3.87–5.11)
RDW: 11.9 % (ref 11.5–15.5)
WBC: 10.3 10*3/uL (ref 4.0–10.5)
nRBC: 0 % (ref 0.0–0.2)

## 2019-06-30 LAB — LIPASE, BLOOD: Lipase: 32 U/L (ref 11–51)

## 2019-06-30 LAB — PREGNANCY, URINE: Preg Test, Ur: NEGATIVE

## 2019-06-30 MED ORDER — ONDANSETRON 4 MG PO TBDP
4.0000 mg | ORAL_TABLET | Freq: Once | ORAL | Status: AC
  Administered 2019-06-30: 4 mg via ORAL
  Filled 2019-06-30: qty 1

## 2019-06-30 MED ORDER — ONDANSETRON 4 MG PO TBDP: 4.0000 mg | ORAL_TABLET | Freq: Three times a day (TID) | ORAL | 0 refills | Status: DC | PRN

## 2019-06-30 MED ORDER — NITROFURANTOIN MONOHYD MACRO 100 MG PO CAPS: 100.0000 mg | ORAL_CAPSULE | Freq: Two times a day (BID) | ORAL | 0 refills | Status: DC

## 2019-06-30 NOTE — Discharge Instructions (Signed)
Take Macrobid as prescribed.  Take Zofran as needed for nausea.  Please call tomorrow to make an appointment for your ultrasound of your legs to make sure there is no blood clot.  Return to the emergency room immediately for new or worsening symptoms or concerns, such as new or worsening swelling, fevers, vomiting, new or worsening abdominal pain or any concerns at all.

## 2019-06-30 NOTE — ED Notes (Signed)
Pt reports R leg swelling for several days   Also request preg test as she feels that she may be pregnant in spite of a period 5/2  She reports breat tenderness and N on and off   Has had no OTC preg test   Reports last preg 8 years ago

## 2019-06-30 NOTE — ED Provider Notes (Signed)
[place] Provider Note   CSN: [id number] Arrival date & time: 06/30/19  1800     History Chief Complaint  Patient presents with  . Leg Swelling    Breanna Malone is a 39 y.o. female.  HPI      39 year old female, with a PMH of asthma, arthritis, bipolar, HLD, HTN, presents with multiple complaints.  Patient states that she has been having swelling of her right leg for the last 2 to 3 days.  She notes some swelling in the left leg but states that the right leg is worse.  She also notes nausea, breast tenderness.  She notes a new partner on 5/3 and since then she has been feeling weak and tired.  She denies any vaginal bleeding, vaginal discharge.  She notes abdominal pain at the umbilicus.  She states pain is only present when she puts her finger in the umbilicus.  Patient denies any fevers, chills, chest pain, shortness of breath.   Past Medical History:  Diagnosis Date  . Abnormal Pap smear   . Arthritis   . Asthma   . Asthma   . Bipolar 1 disorder ([place])   . Bronchitis   . Carpal tunnel syndrome   . Cervical spine pain   . Chest pain   . Diabetes mellitus without complication ([place])   . Elevated cholesterol   . High cholesterol   . Hyperlipidemia   . Hyperlipoproteinemia   . Hypertension   . Post traumatic stress disorder   . Right ovarian cyst 10/31/2012   Complex right ovarian cyst seen 8/18 in ER need f/u US  . Tendonitis   . Vaginal Pap smear, abnormal     Patient Active Problem List   Diagnosis Date Noted  . Body mass index 38.0-38.9, adult 04/30/2019  . Weight loss counseling, encounter for 04/30/2019  . Body mass index 39.0-39.9, adult 11/27/2018  . Carpal tunnel syndrome of right wrist   . Condyloma acuminatum in female 02/24/2014  . URI (upper respiratory infection) 02/18/2013  . Abnormal Pap smear of cervix 11/24/2012  . Asthma 11/24/2012  . Right ovarian cyst 10/31/2012  . Bipolar 1 disorder ([place]) 10/31/2012    Past  Surgical History:  Procedure Laterality Date  . CARPAL TUNNEL RELEASE Right 06/28/2016   Procedure: RIGHT CARPAL TUNNEL RELEASE;  Surgeon: Carole Civil, MD;  Location: [facility];  Service: Orthopedics;  Laterality: Right;  . HERNIA REPAIR     umbilical     OB History    Gravida  7   Para  6   Term  6   Preterm      AB  1   Living  4     SAB  1   TAB      Ectopic      Multiple      Live Births  6           Family History  Problem Relation Age of Onset  . Diabetes Mother   . Hypertension Mother   . Asthma Father   . Bronchitis Daughter   . Asthma Daughter   . Stroke Paternal Grandmother   . Heart attack Paternal Grandmother   . Cancer Paternal Grandmother   . Asthma Son   . Bronchitis Son   . Asthma Daughter   . Bronchitis Daughter   . Asthma Son   . Asthma Other   . Bronchitis Other   . Cancer Cousin     Social History  Tobacco Use  . Smoking status: Never Smoker  . Smokeless tobacco: Never Used  Substance Use Topics  . Alcohol use: No  . Drug use: No    Home Medications Prior to Admission medications   Medication Sig Start Date End Date Taking? Authorizing Provider  lisinopril (PRINIVIL,ZESTRIL) 10 MG tablet Take 5 mg by mouth daily.     [provider]  metFORMIN (GLUCOPHAGE) 500 MG tablet Take 500 mg by mouth 2 (two) times daily with a meal.    [provider]  phentermine (ADIPEX-P) 37.5 MG tablet Take 1 tablet (37.5 mg total) by mouth daily before breakfast. 04/30/19   Estill Dooms, NP  pravastatin (PRAVACHOL) 40 MG tablet Take 40 mg by mouth daily.    [provider]    Allergies    Darvocet [propoxyphene n-acetaminophen], Hydrocodone-acetaminophen, Lactose intolerance (gi), Other, Penicillins, Latex, Lortab [hydrocodone-acetaminophen], and Percocet [oxycodone-acetaminophen]  Review of Systems   Review of Systems  Constitutional: Negative for chills and fever.  Respiratory: Negative for  shortness of breath.   Cardiovascular: Negative for chest pain.  Gastrointestinal: Positive for nausea. Negative for abdominal pain and vomiting.  Genitourinary: Negative for vaginal bleeding and vaginal discharge.  Neurological: Positive for weakness.  All other systems reviewed and are negative.   Physical Exam Updated Vital Signs BP 130/61 (BP Location: Right Arm)   Pulse 89   Temp 98.8 F (37.1 C) (Oral)   Resp 16   Ht 5\' 4"  (1.626 m)   Wt 101.9 kg   LMP 06/21/2019   SpO2 98%   BMI 38.57 kg/m   Physical Exam Vitals and nursing note reviewed.  Constitutional:      Appearance: She is well-developed.  HENT:     Head: Normocephalic and atraumatic.  Eyes:     Conjunctiva/sclera: Conjunctivae normal.  Cardiovascular:     Rate and Rhythm: Normal rate and regular rhythm.     Pulses:          Dorsalis pedis pulses are 2+ on the right side and 2+ on the left side.     Heart sounds: Normal heart sounds. No murmur.  Pulmonary:     Effort: Pulmonary effort is normal. No respiratory distress.     Breath sounds: Normal breath sounds. No wheezing or rales.  Abdominal:     General: Bowel sounds are normal. There is no distension.     Palpations: Abdomen is soft.     Tenderness: There is no abdominal tenderness.  Musculoskeletal:        General: No tenderness or deformity. Normal range of motion.     Cervical back: Neck supple.     Right ankle: Swelling (trace) present.     Left ankle: Swelling (trace) present.  Skin:    General: Skin is warm and dry.     Findings: No erythema or rash.  Neurological:     Mental Status: She is alert and oriented to person, place, and time.  Psychiatric:        Behavior: Behavior normal.     ED Results / Procedures / Treatments   Labs (all labs ordered are listed, but only abnormal results are displayed) Labs Reviewed  PREGNANCY, URINE  URINALYSIS, ROUTINE W REFLEX MICROSCOPIC  COMPREHENSIVE METABOLIC PANEL  LIPASE, BLOOD  CBC WITH  DIFFERENTIAL/PLATELET    EKG None  Radiology No results found.  Procedures Procedures (including critical care time)  Medications Ordered in ED Medications  ondansetron (ZOFRAN-ODT) disintegrating tablet 4 mg (4 mg Oral  Given 06/30/19 1849)    ED Course  I have reviewed the triage vital signs and the nursing notes.  Pertinent labs & imaging results that were available during my care of the patient were reviewed by me and considered in my medical decision making (see chart for details).    MDM Rules/Calculators/A&P                        Patient presents with multiple complaints.  Physical exam notable for trace peripheral edema.  Abdomen soft nontender palpation.  Vital signs stable.  Blood work unremarkable.  Urine with some nitrates, leukocyte esterase and WBCs.  Will send for culture and treat given nausea and abdominal pain.  No flank pain, no fevers, no chills, history physical not consistent with pyelonephritis.  Patient feeling much better after Zofran.  In regards to her peripheral edema, no ultrasound available today.  She may have an ultrasound done tomorrow.  Very low suspicion for DVT.  She denies any chest pain or shortness of breath.  She is very well-appearing, ready and stable for discharge.    At this time there does not appear to be any evidence of an acute emergency medical condition and the patient appears stable for discharge with appropriate outpatient follow up.Diagnosis was discussed with patient who verbalizes understanding and is agreeable to discharge.    Final Clinical Impression(s) / ED Diagnoses Final diagnoses:  None    Rx / DC Orders ED Discharge Orders    None       Rachel Moulds 06/30/19 2211    Noemi Chapel, MD 07/01/19 1451

## 2019-06-30 NOTE — ED Triage Notes (Signed)
Pt presents to ED with complaints of right leg swelling x 2-3 days. Pt states she also wants pregnancy test. Pt states her breasts have been sore and she has had some nausea on and off for the last few weeks.

## 2019-07-03 LAB — URINE CULTURE: Culture: >=100000 — AB

## 2019-07-05 ENCOUNTER — Telehealth: Payer: Self-pay | Admitting: Emergency Medicine

## 2019-07-05 NOTE — Telephone Encounter (Signed)
Post ED Visit - Positive Culture Follow-up  Culture report reviewed by antimicrobial stewardship pharmacist: [facility] Team []  Elenor Quinones, Pharm.D. []  Heide Guile, Pharm.D., BCPS AQ-ID []  Parks Neptune, Pharm.D., BCPS []  Alycia Rossetti, Pharm.D., BCPS []  [place], Pharm.D., BCPS, AAHIVP []  Legrand Como, Pharm.D., BCPS, AAHIVP []  Salome Arnt, PharmD, BCPS []  Johnnette Gourd, PharmD, BCPS []  Hughes Better, PharmD, BCPS []  Leeroy Cha, PharmD []  Laqueta Linden, PharmD, BCPS [x]  Albertina Parr, PharmD  [place] Team []  Leodis Sias, PharmD []  Lindell Spar, PharmD []  Royetta Asal, PharmD []  Graylin Shiver, Rph []  Rema Fendt) Glennon Mac, PharmD []  Arlyn Dunning, PharmD []  Netta Cedars, PharmD []  Dia Sitter, PharmD []  [name], PharmD []  Gretta Arab, PharmD []  Theodis Shove, PharmD []  Peggyann Juba, PharmD []  Reuel Boom, PharmD   Positive urine culture Treated with Nitrofurantoin, organism sensitive to the same and no further patient follow-up is required at this time.  Milus Mallick 07/05/2019, 5:44 PM

## 2019-07-24 ENCOUNTER — Other Ambulatory Visit: Payer: Self-pay

## 2019-07-24 ENCOUNTER — Emergency Department (HOSPITAL_COMMUNITY)
Admission: EM | Admit: 2019-07-24 | Discharge: 2019-07-24 | Disposition: A | Discharge status: Home / Self Care | Payer: Medicaid Other | Attending: Emergency Medicine | Admitting: Emergency Medicine

## 2019-07-24 ENCOUNTER — Encounter (HOSPITAL_COMMUNITY): Payer: Self-pay | Admitting: *Deleted

## 2019-07-24 DIAGNOSIS — E119 Type 2 diabetes mellitus without complications: Secondary | ICD-10-CM | POA: Diagnosis not present

## 2019-07-24 DIAGNOSIS — J45909 Unspecified asthma, uncomplicated: Secondary | ICD-10-CM | POA: Insufficient documentation

## 2019-07-24 DIAGNOSIS — R609 Edema, unspecified: Secondary | ICD-10-CM | POA: Insufficient documentation

## 2019-07-24 DIAGNOSIS — Z91040 Latex allergy status: Secondary | ICD-10-CM | POA: Insufficient documentation

## 2019-07-24 DIAGNOSIS — R2233 Localized swelling, mass and lump, upper limb, bilateral: Secondary | ICD-10-CM | POA: Diagnosis present

## 2019-07-24 DIAGNOSIS — Z7984 Long term (current) use of oral hypoglycemic drugs: Secondary | ICD-10-CM | POA: Diagnosis not present

## 2019-07-24 DIAGNOSIS — Z79899 Other long term (current) drug therapy: Secondary | ICD-10-CM | POA: Diagnosis not present

## 2019-07-24 DIAGNOSIS — I10 Essential (primary) hypertension: Secondary | ICD-10-CM | POA: Diagnosis not present

## 2019-07-24 DIAGNOSIS — R600 Localized edema: Secondary | ICD-10-CM

## 2019-07-24 LAB — BASIC METABOLIC PANEL
Anion gap: 12 (ref 5–15)
BUN: 9 mg/dL (ref 6–20)
CO2: 27 mmol/L (ref 22–32)
Calcium: 9.7 mg/dL (ref 8.9–10.3)
Chloride: 97 mmol/L — ABNORMAL LOW (ref 98–111)
Creatinine, Ser: 0.77 mg/dL (ref 0.44–1.00)
GFR calc Af Amer: >60 mL/min (ref >60)
GFR calc non Af Amer: >60 mL/min (ref >60)
Glucose, Bld: 109 mg/dL — ABNORMAL HIGH (ref 70–99)
Potassium: 3.3 mmol/L — ABNORMAL LOW (ref 3.5–5.1)
Sodium: 136 mmol/L (ref 135–145)

## 2019-07-24 LAB — URINALYSIS, ROUTINE W REFLEX MICROSCOPIC
Bilirubin Urine: NEGATIVE
Glucose, UA: NEGATIVE mg/dL
Ketones, ur: NEGATIVE mg/dL
Nitrite: NEGATIVE
Protein, ur: NEGATIVE mg/dL
Specific Gravity, Urine: 1.012 (ref 1.005–1.030)
pH: 6 (ref 5.0–8.0)

## 2019-07-24 LAB — PREGNANCY, URINE: Preg Test, Ur: NEGATIVE

## 2019-07-24 LAB — CBG MONITORING, ED: Glucose-Capillary: 97 mg/dL (ref 70–99)

## 2019-07-24 MED ORDER — FUROSEMIDE 20 MG PO TABS: 20.0000 mg | ORAL_TABLET | Freq: Every day | ORAL | 1 refills | Status: DC

## 2019-07-24 NOTE — ED Triage Notes (Signed)
Hand swelling ?

## 2019-07-24 NOTE — ED Notes (Signed)
Reports has seen Dr Legrand Rams for same  She states he said was fluid and gave her fluid pills but her joints are "locking up"   And she may be pregnant

## 2019-07-24 NOTE — ED Provider Notes (Signed)
[facility] EMERGENCY DEPARTMENT Provider Note   CSN: [id number] Arrival date & time: 07/24/19  1157     History Chief Complaint  Patient presents with  . Hand Problem  . Nausea    Breanna Malone is a 39 y.o. female.  Patient complains of swelling in bilateral hands and bilateral feet.  Patient reports her primary care doctor gave her prescription for a fluid pill however this has not improved her swelling patient reports she has carpal tunnel in both hands and thinks this may be related.  Patient also complains of swelling in both feet patient states the last time that her hands and her feet swelled this while she was pregnant.  Patient reports her last period was in April.  The history is provided by the patient. No language interpreter was used.  Extremity Pain She has tried nothing for the symptoms.       Past Medical History:  Diagnosis Date  . Abnormal Pap smear   . Arthritis   . Asthma   . Asthma   . Bipolar 1 disorder ([place])   . Bronchitis   . Carpal tunnel syndrome   . Cervical spine pain   . Chest pain   . Diabetes mellitus without complication ([place])   . Elevated cholesterol   . High cholesterol   . Hyperlipidemia   . Hyperlipoproteinemia   . Hypertension   . Post traumatic stress disorder   . Right ovarian cyst 10/31/2012   Complex right ovarian cyst seen 8/18 in ER need f/u US  . Tendonitis   . Vaginal Pap smear, abnormal     Patient Active Problem List   Diagnosis Date Noted  . Body mass index 38.0-38.9, adult 04/30/2019  . Weight loss counseling, encounter for 04/30/2019  . Body mass index 39.0-39.9, adult 11/27/2018  . Carpal tunnel syndrome of right wrist   . Condyloma acuminatum in female 02/24/2014  . URI (upper respiratory infection) 02/18/2013  . Abnormal Pap smear of cervix 11/24/2012  . Asthma 11/24/2012  . Right ovarian cyst 10/31/2012  . Bipolar 1 disorder ([place]) 10/31/2012    Past Surgical History:  Procedure Laterality Date  .  CARPAL TUNNEL RELEASE Right 06/28/2016   Procedure: RIGHT CARPAL TUNNEL RELEASE;  Surgeon: Carole Civil, MD;  Location: [facility];  Service: Orthopedics;  Laterality: Right;  . HERNIA REPAIR     umbilical     OB History    Gravida  7   Para  6   Term  6   Preterm      AB  1   Living  4     SAB  1   TAB      Ectopic      Multiple      Live Births  6           Family History  Problem Relation Age of Onset  . Diabetes Mother   . Hypertension Mother   . Asthma Father   . Bronchitis Daughter   . Asthma Daughter   . Stroke Paternal Grandmother   . Heart attack Paternal Grandmother   . Cancer Paternal Grandmother   . Asthma Son   . Bronchitis Son   . Asthma Daughter   . Bronchitis Daughter   . Asthma Son   . Asthma Other   . Bronchitis Other   . Cancer Cousin     Social History   Tobacco Use  . Smoking status: Never Smoker  . Smokeless tobacco:  Never Used  Substance Use Topics  . Alcohol use: No  . Drug use: No    Home Medications Prior to Admission medications   Medication Sig Start Date End Date Taking? Authorizing Provider  furosemide (LASIX) 20 MG tablet Take 1 tablet (20 mg total) by mouth daily. 07/24/19 07/23/20  Fransico Meadow, PA-C  lisinopril (PRINIVIL,ZESTRIL) 10 MG tablet Take 5 mg by mouth daily.     [provider]  metFORMIN (GLUCOPHAGE) 500 MG tablet Take 500 mg by mouth 2 (two) times daily with a meal.    [provider]  nitrofurantoin, macrocrystal-monohydrate, (MACROBID) 100 MG capsule Take 1 capsule (100 mg total) by mouth 2 (two) times daily. 06/30/19   Kendrick, Caitlyn S, PA-C  ondansetron (ZOFRAN ODT) 4 MG disintegrating tablet Take 1 tablet (4 mg total) by mouth every 8 (eight) hours as needed for nausea or vomiting. 06/30/19   Kendrick, Caitlyn S, PA-C  phentermine (ADIPEX-P) 37.5 MG tablet Take 1 tablet (37.5 mg total) by mouth daily before breakfast. 04/30/19   Estill Dooms, NP  pravastatin  (PRAVACHOL) 40 MG tablet Take 40 mg by mouth daily.    [provider]    Allergies    Darvocet [propoxyphene n-acetaminophen], Hydrocodone-acetaminophen, Lactose intolerance (gi), Other, Penicillins, Latex, Lortab [hydrocodone-acetaminophen], and Percocet [oxycodone-acetaminophen]  Review of Systems   Review of Systems  All other systems reviewed and are negative.   Physical Exam Updated Vital Signs BP 129/75 (BP Location: Right Arm)   Pulse 80   Temp 98.6 F (37 C)   Resp 18   SpO2 98%   Physical Exam Vitals and nursing note reviewed.  Constitutional:      Appearance: She is well-developed.  HENT:     Head: Normocephalic.  Pulmonary:     Effort: Pulmonary effort is normal.  Abdominal:     General: There is no distension.  Musculoskeletal:        General: Swelling present. Normal range of motion.     Cervical back: Normal range of motion.     Comments: Bilateral hands and bilateral feet appear to be mildly swollen.  Neurological:     Mental Status: She is alert and oriented to person, place, and time.     ED Results / Procedures / Treatments   Labs (all labs ordered are listed, but only abnormal results are displayed) Labs Reviewed  URINALYSIS, ROUTINE W REFLEX MICROSCOPIC - Abnormal; Notable for the following components:      Result Value   APPearance HAZY (*)    Hgb urine dipstick MODERATE (*)    Leukocytes,Ua TRACE (*)    Bacteria, UA RARE (*)    All other components within normal limits  BASIC METABOLIC PANEL - Abnormal; Notable for the following components:   Potassium 3.3 (*)    Chloride 97 (*)    Glucose, Bld 109 (*)    All other components within normal limits  PREGNANCY, URINE  CBG MONITORING, ED    EKG None  Radiology No results found.  Procedures Procedures (including critical care time)  Medications Ordered in ED Medications - No data to display  ED Course  I have reviewed the triage vital signs and the nursing  notes.  Pertinent labs & imaging results that were available during my care of the patient were reviewed by me and considered in my medical decision making (see chart for details).    MDM Rules/Calculators/A&P  MDM: Patient's urine pregnancy is negative be met is normal with the exception of a potassium of 3.3.  Patient is given a prescription for Lasix she is advised to take 1 tablet as needed for swelling she is to follow-up with her primary care physician patient is also advised to start a multivitamin with potassium. Final Clinical Impression(s) / ED Diagnoses Final diagnoses:  Mild peripheral edema    Rx / DC Orders ED Discharge Orders         Ordered    furosemide (LASIX) 20 MG tablet  Daily     07/24/19 1649        An After Visit Summary was printed and given to the patient.    Fransico Meadow, [place] 07/24/19 1840    Milton Ferguson, MD 07/25/19 [phone]

## 2019-07-24 NOTE — Discharge Instructions (Addendum)
Return if any problems.

## 2019-07-31 ENCOUNTER — Telehealth: Payer: Self-pay | Admitting: Adult Health

## 2019-07-31 NOTE — Telephone Encounter (Signed)

## 2019-10-03 ENCOUNTER — Emergency Department (HOSPITAL_COMMUNITY)
Admission: EM | Admit: 2019-10-03 | Discharge: 2019-10-03 | Disposition: A | Discharge status: Home / Self Care | Payer: Medicaid Other

## 2019-10-03 ENCOUNTER — Other Ambulatory Visit: Payer: Self-pay

## 2019-10-05 ENCOUNTER — Telehealth: Payer: Self-pay | Admitting: Adult Health

## 2019-10-05 NOTE — Telephone Encounter (Signed)
Pt called stating that she would like a call back from the nurse. Please contact pt

## 2019-10-06 ENCOUNTER — Telehealth: Payer: Self-pay | Admitting: *Deleted

## 2019-10-06 NOTE — Telephone Encounter (Signed)
Patient states she "did something" and is now having pain in her vaginal area and in the tops of her legs.  Patients phone was breaking up during our conversation so it was difficult to hear her complaints. Appt scheduled for tomorrow.

## 2019-10-07 ENCOUNTER — Other Ambulatory Visit (HOSPITAL_COMMUNITY)
Admission: RE | Admit: 2019-10-07 | Discharge: 2019-10-07 | Disposition: A | Discharge status: Home / Self Care | Payer: Medicaid Other | Source: Ambulatory Visit | Attending: Adult Health | Admitting: Adult Health

## 2019-10-07 ENCOUNTER — Encounter: Payer: Self-pay | Admitting: Adult Health

## 2019-10-07 ENCOUNTER — Other Ambulatory Visit: Payer: Self-pay

## 2019-10-07 ENCOUNTER — Ambulatory Visit (INDEPENDENT_AMBULATORY_CARE_PROVIDER_SITE_OTHER): Payer: Medicaid Other | Admitting: Adult Health

## 2019-10-07 VITALS — BP 115/80 | HR 91 | Ht 64.0 in | Wt 226.2 lb

## 2019-10-07 DIAGNOSIS — Z113 Encounter for screening for infections with a predominantly sexual mode of transmission: Secondary | ICD-10-CM | POA: Insufficient documentation

## 2019-10-07 DIAGNOSIS — R102 Pelvic and perineal pain: Secondary | ICD-10-CM | POA: Diagnosis present

## 2019-10-07 LAB — POCT URINALYSIS DIPSTICK
Glucose, UA: NEGATIVE
Leukocytes, UA: NEGATIVE
Nitrite, UA: NEGATIVE
Protein, UA: NEGATIVE

## 2019-10-07 LAB — POCT URINE PREGNANCY: Preg Test, Ur: NEGATIVE

## 2019-10-07 NOTE — Progress Notes (Signed)
  Subjective:     Patient ID: Breanna Malone, female   DOB: 01/06/1981, 39 y.o.   MRN: [id number]  HPI Breanna Malone is a 39 year old black female,separated, [id number] in complaining of pelvic pain, and ?UTI.She had sex Saturday and legs felt paralyzed after, and felt nauseated. PCP is Dr Breanna Malone.  Review of Systems +pelvic pain ?UTI  Reviewed past medical,surgical, social and family history. Reviewed medications and allergies.     Objective:   Physical Exam BP 115/80 (BP Location: Left Arm, Patient Position: Sitting, Cuff Size: Normal)   Pulse 91   Ht 5\' 4"  (1.626 m)   Wt 226 lb 3.2 oz (102.6 kg)   LMP 09/22/2019   BMI 38.83 kg/m UPT is negative Urine dipstick is negative Skin warm and dry.Pelvic: external genitalia is normal in appearance no lesions, vagina: white discharge without odor,urethra has no lesions or masses noted, cervix:smooth and bulbous, uterus: normal size, shape and contour, + tender, no masses felt, adnexa: no masses or tenderness noted. Bladder is non tender and no masses felt.  CV swab obtained Examination chaperoned by Breanna Squibb LPN        Assessment:     1. Pelvic pain Will get GYN [place] to assess uterus and ovaries  2. Screening examination for STD (sexually transmitted disease) CV swab sent    Plan:     Use condoms

## 2019-10-08 ENCOUNTER — Encounter: Payer: Self-pay | Admitting: Adult Health

## 2019-10-08 ENCOUNTER — Telehealth: Payer: Self-pay | Admitting: Adult Health

## 2019-10-08 DIAGNOSIS — B9689 Other specified bacterial agents as the cause of diseases classified elsewhere: Secondary | ICD-10-CM

## 2019-10-08 DIAGNOSIS — N760 Acute vaginitis: Secondary | ICD-10-CM | POA: Insufficient documentation

## 2019-10-08 HISTORY — DX: Other specified bacterial agents as the cause of diseases classified elsewhere: B96.89

## 2019-10-08 LAB — CERVICOVAGINAL ANCILLARY ONLY
Bacterial Vaginitis (gardnerella): POSITIVE — AB
Candida Glabrata: NEGATIVE
Candida Vaginitis: NEGATIVE
Chlamydia: NEGATIVE
Comment: NEGATIVE
Comment: NEGATIVE
Comment: NEGATIVE
Comment: NEGATIVE
Comment: NEGATIVE
Comment: NORMAL
Neisseria Gonorrhea: NEGATIVE
Trichomonas: NEGATIVE

## 2019-10-08 MED ORDER — METRONIDAZOLE 500 MG PO TABS: 500.0000 mg | ORAL_TABLET | Freq: Two times a day (BID) | ORAL | 0 refills | Status: DC

## 2019-10-08 NOTE — Telephone Encounter (Signed)
No VM, if she calls, vaginal swab +BV rx sent in for flagyl to [facility]

## 2019-10-16 ENCOUNTER — Telehealth: Payer: Self-pay | Admitting: Adult Health

## 2019-10-16 ENCOUNTER — Other Ambulatory Visit: Payer: Medicaid Other

## 2019-10-16 NOTE — Telephone Encounter (Signed)
Telephoned patient at home number and advised patient vaginal swab showed BV. Medication is at [facility]. Patient voiced understanding.

## 2019-10-16 NOTE — Telephone Encounter (Signed)
Patient called in asking about her lab results.

## 2019-10-20 ENCOUNTER — Other Ambulatory Visit: Payer: Medicaid Other

## 2019-12-04 ENCOUNTER — Telehealth: Payer: Self-pay | Admitting: Women's Health

## 2019-12-04 NOTE — Telephone Encounter (Signed)
Voice mail not set up @ 2:00 pm. No other # listed. [place]

## 2019-12-04 NOTE — Telephone Encounter (Signed)
Patient called concerned with a condom being "stuck inside", states it happened about 2 hours ago  Please advise

## 2019-12-07 ENCOUNTER — Telehealth: Payer: Self-pay | Admitting: Women's Health

## 2019-12-07 NOTE — Telephone Encounter (Signed)
Pt states that she missed nurse call.

## 2019-12-07 NOTE — Telephone Encounter (Signed)
Voice mail not set up @ 4:28 pm. No other # listed. Breanna Malone

## 2019-12-07 NOTE — Telephone Encounter (Signed)
Voice mail not set up @ 4:29 pm. No other # listed. [place]

## 2019-12-08 NOTE — Telephone Encounter (Signed)
Voice mail not set up @ 8:51 am. 3 attempts to reach pt. Closing encounter. [place]

## 2019-12-17 ENCOUNTER — Ambulatory Visit (INDEPENDENT_AMBULATORY_CARE_PROVIDER_SITE_OTHER): Payer: Medicaid Other | Admitting: Advanced Practice Midwife

## 2019-12-17 DIAGNOSIS — N926 Irregular menstruation, unspecified: Secondary | ICD-10-CM | POA: Diagnosis not present

## 2019-12-17 DIAGNOSIS — N644 Mastodynia: Secondary | ICD-10-CM | POA: Diagnosis not present

## 2019-12-17 LAB — POCT URINE PREGNANCY: Preg Test, Ur: NEGATIVE

## 2019-12-17 MED ORDER — ONDANSETRON 4 MG PO TBDP: 4.0000 mg | ORAL_TABLET | Freq: Four times a day (QID) | ORAL | 2 refills | Status: DC | PRN

## 2019-12-17 MED ORDER — COMPLETENATE 29-1 MG PO CHEW: 1.0000 | CHEWABLE_TABLET | Freq: Every day | ORAL | Status: DC

## 2019-12-17 NOTE — Progress Notes (Signed)
[facility] Visit  Patient name: Breanna Malone MRN [id number]  Date of birth: [birth year]  CC & HPI:  Breanna Malone is a 39 y.o. African American female presenting today for UPT  And breast tenderness. Period started 10/21-10/27, but started having nausea and breast tenderness on 10/19.Has hx of breast tenderness w/o sequelae in the past.  Breasts are tender all over.  Wanting to get pregnant for a few months. Has not had a mammogram.   Pertinent History Reviewed:  Medical & Surgical Hx:   Past Medical History:  Diagnosis Date  . Abnormal Pap smear   . Arthritis   . Asthma   . Asthma   . Bipolar 1 disorder ([place])   . Bronchitis   . BV (bacterial vaginosis) 10/08/2019   8/19 rx flagyl  . Carpal tunnel syndrome   . Cervical spine pain   . Chest pain   . Diabetes mellitus without complication ([place])   . Elevated cholesterol   . High cholesterol   . Hyperlipidemia   . Hyperlipoproteinemia   . Hypertension   . Post traumatic stress disorder   . Right ovarian cyst 10/31/2012   Complex right ovarian cyst seen 8/18 in ER need f/u US  . Tendonitis   . Vaginal Pap smear, abnormal    Past Surgical History:  Procedure Laterality Date  . CARPAL TUNNEL RELEASE Right 06/28/2016   Procedure: RIGHT CARPAL TUNNEL RELEASE;  Surgeon: Carole Civil, MD;  Location: [facility];  Service: Orthopedics;  Laterality: Right;  . HERNIA REPAIR     umbilical   Family History  Problem Relation Age of Onset  . Diabetes Mother   . Hypertension Mother   . Asthma Father   . Bronchitis Daughter   . Asthma Daughter   . Stroke Paternal Grandmother   . Heart attack Paternal Grandmother   . Cancer Paternal Grandmother   . Asthma Son   . Bronchitis Son   . Asthma Daughter   . Bronchitis Daughter   . Asthma Son   . Asthma Other   . Bronchitis Other   . Cancer Cousin     Current Outpatient Medications:  .  acetaminophen (TYLENOL) 500 MG tablet, Take 500 mg by mouth every 6 (six) hours as  needed., Disp: , Rfl:  .  lisinopril (PRINIVIL,ZESTRIL) 10 MG tablet, Take 5 mg by mouth daily. , Disp: , Rfl:  .  metFORMIN (GLUCOPHAGE) 500 MG tablet, Take 500 mg by mouth 2 (two) times daily with a meal., Disp: , Rfl:  .  pravastatin (PRAVACHOL) 40 MG tablet, Take 40 mg by mouth daily. , Disp: , Rfl:  .  ondansetron (ZOFRAN ODT) 4 MG disintegrating tablet, Take 1 tablet (4 mg total) by mouth every 6 (six) hours as needed for nausea., Disp: 30 tablet, Rfl: 2  Current Facility-Administered Medications:  .  prenatal vitamin w/FE, FA (NATACHEW) chewable tablet 1 tablet, 1 tablet, Oral, Q1200, [place], Joaquim Lai, CNM Social History: Reviewed -  reports that she has never smoked. She has never used smokeless tobacco.  Review of Systems:   Constitutional: Negative for fever and chills Eyes: Negative for visual disturbances Respiratory: Negative for shortness of breath, dyspnea Cardiovascular: Negative for chest pain or palpitations  Gastrointestinal: Negative for diarrhea and constipation; feels somewhat bloated Genitourinary: Negative for dysuria and urgency, vaginal irritation or itching Musculoskeletal: Negative for back pain, joint pain, myalgias  Neurological: Negative for dizziness and headaches    Objective Findings:    Physical  Examination: There were no vitals filed for this visit. General appearance - well appearing, and in no distress Mental status - alert, oriented to person, place, and time Chest:  Normal respiratory effort Breast:  No masses, retractions, nipple discharge.  Generalized tenderness Heart - normal rate and regular rhythm Abdomen:  Soft, nontender Pelvic: deferred Musculoskeletal:  Normal range of motion without pain Extremities:  No edema    No results found for this or any previous visit (from the past 24 hour(s)).    Assessment & Plan:  A:   Breast tenderness  Desired pregnancy   P:  Decrease caffeine intake  Try Vitamin E daily for a  few weeks  Rx PNV and prn zofran  Have sex every other day (starting 10 days after each period starts) for 10 days)     No follow-ups on file.  Christin Fudge CNM 12/17/2019 2:42 PM

## 2019-12-17 NOTE — Patient Instructions (Addendum)
[phone]  To schedule mammogram  Have sex every other day (starting 10 days after each period starts) for 10 days)  Take Vitamin E capsules every day along with your prenatal vitamins  Watch your caffeine intake    Breast Tenderness Breast tenderness is a common problem for women of all ages, but may also occur in men. Breast tenderness may range from mild discomfort to severe pain. In women, the pain usually comes and goes with the menstrual cycle, but it can also be constant. Breast tenderness has many possible causes, including hormone changes, infections, and taking certain medicines. You may have tests, such as a mammogram or an ultrasound, to check for any unusual findings. Having breast tenderness usually does not mean that you have breast cancer. Follow these instructions at home: Managing pain and discomfort   If directed, put ice to the painful area. To do this: ? Put ice in a plastic bag. ? Place a towel between your skin and the bag. ? Leave the ice on for 20 minutes, 2-3 times a day.  Wear a supportive bra, especially during exercise. You may also want to wear a supportive bra while sleeping if your breasts are very tender. Medicines  Take over-the-counter and prescription medicines only as told by your health care provider. If the cause of your pain is infection, you may be prescribed an antibiotic medicine.  If you were prescribed an antibiotic, take it as told by your health care provider. Do not stop taking the antibiotic even if you start to feel better. Eating and drinking  Your health care provider may recommend that you lessen the amount of fat in your diet. You can do this by: ? Limiting fried foods. ? Cooking foods using methods such as baking, boiling, grilling, and broiling.  Decrease the amount of caffeine in your diet. Instead, drink more water and choose caffeine-free drinks. General instructions   Keep a log of the days and times when your breasts  are most tender.  Ask your health care provider how to do breast exams at home. This will help you notice if you have an unusual growth or lump.  Keep all follow-up visits as told by your health care provider. This is important. Contact a health care provider if:  Any part of your breast is hard, red, and hot to the touch. This may be a sign of infection.  You are a woman and: ? Not breastfeeding and you have fluid, especially blood or pus, coming out of your nipples. ? Have a new or painful lump in your breast that remains after your menstrual period ends.  You have a fever.  Your pain does not improve or it gets worse.  Your pain is interfering with your daily activities. Summary  Breast tenderness may range from mild discomfort to severe pain.  Breast tenderness has many possible causes, including hormone changes, infections, and taking certain medicines.  It can be treated with ice, wearing a supportive bra, and medicines.  Make changes to your diet if told to by your health care provider. This information is not intended to replace advice given to you by your health care provider. Make sure you discuss any questions you have with your health care provider. Document Revised: 06/30/2018 Document Reviewed: 06/30/2018 Elsevier Patient Education  [place].

## 2019-12-25 ENCOUNTER — Ambulatory Visit (HOSPITAL_COMMUNITY)
Admission: RE | Admit: 2019-12-25 | Discharge: 2019-12-25 | Disposition: A | Discharge status: Home / Self Care | Payer: Medicaid Other | Source: Ambulatory Visit | Attending: Internal Medicine | Admitting: Internal Medicine

## 2019-12-25 ENCOUNTER — Other Ambulatory Visit: Payer: Self-pay

## 2019-12-25 ENCOUNTER — Other Ambulatory Visit (HOSPITAL_COMMUNITY): Payer: Self-pay | Admitting: Internal Medicine

## 2019-12-25 DIAGNOSIS — M79642 Pain in left hand: Secondary | ICD-10-CM | POA: Diagnosis present

## 2020-01-08 ENCOUNTER — Telehealth: Payer: Self-pay | Admitting: Adult Health

## 2020-01-08 NOTE — Telephone Encounter (Signed)
Patient States that she has been having her period on and off for two weeks. Pt states that she is also experiencing nausea and her periods are heavy. Please contact pt

## 2020-01-08 NOTE — Telephone Encounter (Signed)
Pt states she has started bleeding 1 week before she's supposed to. Pt states she is having nausea. Pt is concerned about a cyst. I advised not to get alarmed that her period started 1 week early. Let's see what happens this month and next month. If period is still not normal, let us know. Pt voiced understanding. [place]

## 2020-01-26 ENCOUNTER — Encounter: Payer: Self-pay | Admitting: Neurology

## 2020-01-29 ENCOUNTER — Other Ambulatory Visit: Payer: Self-pay

## 2020-01-29 DIAGNOSIS — R202 Paresthesia of skin: Secondary | ICD-10-CM

## 2020-02-15 ENCOUNTER — Other Ambulatory Visit (HOSPITAL_COMMUNITY): Payer: Self-pay | Admitting: Internal Medicine

## 2020-02-15 DIAGNOSIS — Z1231 Encounter for screening mammogram for malignant neoplasm of breast: Secondary | ICD-10-CM

## 2020-02-24 ENCOUNTER — Encounter: Payer: Medicaid Other | Admitting: Neurology

## 2020-02-24 ENCOUNTER — Encounter: Payer: Self-pay | Admitting: Neurology

## 2020-02-25 ENCOUNTER — Telehealth: Payer: Self-pay | Admitting: Advanced Practice Midwife

## 2020-02-25 NOTE — Telephone Encounter (Signed)
Patient is experiencing soreness around the nipple area of breast and wants a call back from the nurse. Clinical staff will follow up with patient.

## 2020-02-26 ENCOUNTER — Encounter (HOSPITAL_COMMUNITY): Payer: Self-pay

## 2020-02-26 ENCOUNTER — Emergency Department (HOSPITAL_COMMUNITY)
Admission: EM | Admit: 2020-02-26 | Discharge: 2020-02-26 | Disposition: A | Discharge status: Home / Self Care | Payer: Medicaid Other | Attending: Emergency Medicine | Admitting: Emergency Medicine

## 2020-02-26 ENCOUNTER — Other Ambulatory Visit: Payer: Self-pay

## 2020-02-26 DIAGNOSIS — Z7984 Long term (current) use of oral hypoglycemic drugs: Secondary | ICD-10-CM | POA: Diagnosis not present

## 2020-02-26 DIAGNOSIS — N3001 Acute cystitis with hematuria: Secondary | ICD-10-CM | POA: Insufficient documentation

## 2020-02-26 DIAGNOSIS — N939 Abnormal uterine and vaginal bleeding, unspecified: Secondary | ICD-10-CM | POA: Insufficient documentation

## 2020-02-26 DIAGNOSIS — Z91040 Latex allergy status: Secondary | ICD-10-CM | POA: Diagnosis not present

## 2020-02-26 DIAGNOSIS — Z79899 Other long term (current) drug therapy: Secondary | ICD-10-CM | POA: Diagnosis not present

## 2020-02-26 DIAGNOSIS — E119 Type 2 diabetes mellitus without complications: Secondary | ICD-10-CM | POA: Insufficient documentation

## 2020-02-26 DIAGNOSIS — I10 Essential (primary) hypertension: Secondary | ICD-10-CM | POA: Insufficient documentation

## 2020-02-26 DIAGNOSIS — J45909 Unspecified asthma, uncomplicated: Secondary | ICD-10-CM | POA: Diagnosis not present

## 2020-02-26 DIAGNOSIS — N644 Mastodynia: Secondary | ICD-10-CM | POA: Diagnosis not present

## 2020-02-26 DIAGNOSIS — R110 Nausea: Secondary | ICD-10-CM

## 2020-02-26 LAB — URINALYSIS, ROUTINE W REFLEX MICROSCOPIC
Bilirubin Urine: NEGATIVE
Glucose, UA: NEGATIVE mg/dL
Ketones, ur: NEGATIVE mg/dL
Nitrite: POSITIVE — AB
Protein, ur: 30 mg/dL — AB
Specific Gravity, Urine: 1.021 (ref 1.005–1.030)
pH: 5 (ref 5.0–8.0)

## 2020-02-26 LAB — CBC WITH DIFFERENTIAL/PLATELET
Abs Immature Granulocytes: 0.01 10*3/uL (ref 0.00–0.07)
Basophils Absolute: 0 10*3/uL (ref 0.0–0.1)
Basophils Relative: 1 %
Eosinophils Absolute: 0.1 10*3/uL (ref 0.0–0.5)
Eosinophils Relative: 1 %
HCT: 40.6 % (ref 36.0–46.0)
Hemoglobin: 12.9 g/dL (ref 12.0–15.0)
Immature Granulocytes: 0 %
Lymphocytes Relative: 42 %
Lymphs Abs: 3.5 10*3/uL (ref 0.7–4.0)
MCH: 30.8 pg (ref 26.0–34.0)
MCHC: 31.8 g/dL (ref 30.0–36.0)
MCV: 96.9 fL (ref 80.0–100.0)
Monocytes Absolute: 0.5 10*3/uL (ref 0.1–1.0)
Monocytes Relative: 6 %
Neutro Abs: 4.2 10*3/uL (ref 1.7–7.7)
Neutrophils Relative %: 50 %
Platelets: 328 10*3/uL (ref 150–400)
RBC: 4.19 MIL/uL (ref 3.87–5.11)
RDW: 11.9 % (ref 11.5–15.5)
WBC: 8.4 10*3/uL (ref 4.0–10.5)
nRBC: 0 % (ref 0.0–0.2)

## 2020-02-26 LAB — COMPREHENSIVE METABOLIC PANEL
ALT: 36 U/L (ref 0–44)
AST: 35 U/L (ref 15–41)
Albumin: 4.3 g/dL (ref 3.5–5.0)
Alkaline Phosphatase: 147 U/L — ABNORMAL HIGH (ref 38–126)
Anion gap: 10 (ref 5–15)
BUN: 12 mg/dL (ref 6–20)
CO2: 27 mmol/L (ref 22–32)
Calcium: 9.3 mg/dL (ref 8.9–10.3)
Chloride: 101 mmol/L (ref 98–111)
Creatinine, Ser: 0.82 mg/dL (ref 0.44–1.00)
GFR, Estimated: >60 mL/min (ref >60)
Glucose, Bld: 97 mg/dL (ref 70–99)
Potassium: 3.5 mmol/L (ref 3.5–5.1)
Sodium: 138 mmol/L (ref 135–145)
Total Bilirubin: 0.8 mg/dL (ref 0.3–1.2)
Total Protein: 7.7 g/dL (ref 6.5–8.1)

## 2020-02-26 LAB — LIPASE, BLOOD: Lipase: 29 U/L (ref 11–51)

## 2020-02-26 LAB — POC URINE PREG, ED: Preg Test, Ur: NEGATIVE

## 2020-02-26 LAB — HCG, QUANTITATIVE, PREGNANCY: hCG, Beta Chain, Quant, S: 3 m[IU]/mL (ref <5)

## 2020-02-26 MED ORDER — ONDANSETRON 4 MG PO TBDP: 4.0000 mg | ORAL_TABLET | Freq: Three times a day (TID) | ORAL | 0 refills | Status: DC | PRN

## 2020-02-26 MED ORDER — ONDANSETRON 4 MG PO TBDP
4.0000 mg | ORAL_TABLET | Freq: Once | ORAL | Status: AC
  Administered 2020-02-26: 4 mg via ORAL
  Filled 2020-02-26: qty 1

## 2020-02-26 MED ORDER — IBUPROFEN 600 MG PO TABS: 600.0000 mg | ORAL_TABLET | Freq: Four times a day (QID) | ORAL | 0 refills | Status: DC | PRN

## 2020-02-26 NOTE — ED Triage Notes (Signed)
Pt presents to ED with sore nipples and pain from the inside, vaginal bleeding since Jan 4th. Last menstrual noted to stop 2 or 3 days before christmas. Pt is not on birthcontrol.

## 2020-02-26 NOTE — Discharge Instructions (Signed)
Your labs today are negative including a negative pregnancy test. Your symptoms may be hormonal related and I recommend followup care with your gynecologist for further testing if your symptoms persist.  You may take ibuprofen as prescribed for breast soreness.  You have also been prescribed nausea medication if this symptom persists.

## 2020-02-27 NOTE — ED Provider Notes (Signed)
[facility] EMERGENCY DEPARTMENT Provider Note   CSN: [id number] Arrival date & time: 02/26/20  1109     History Chief Complaint  Patient presents with   Vaginal Bleeding    Breanna Malone is a 40 y.o. female with past history of asthma, DM, HTN, G2P2, presenting with symptoms concerning for possible pregnancy. She describes a 4 day history of bilateral breast tenderness, nausea without emesis and abnormal vaginal bleeding.  Her LMP was 02/15/20, lighter than normal but lasted her typical 4 days, then she developed vaginal bleeding 4 days ago similar in flow to normal period for a day, with mild spotting since.  She denies pelvic pain, vaginal discharge, also no fevers, chills, abdominal pain or distention, dysuria, breast mass or nipple discharge.  She is sexually active with no contraceptive use.  She does report she was 5 months pregnant with her youngest child before she new she was pregnant.  HPI     Past Medical History:  Diagnosis Date   Abnormal Pap smear    Arthritis    Asthma    Asthma    Bipolar 1 disorder ([place])    Bronchitis    BV (bacterial vaginosis) 10/08/2019   8/19 rx flagyl   Carpal tunnel syndrome    Cervical spine pain    Chest pain    Diabetes mellitus without complication (HCC)    Elevated cholesterol    High cholesterol    Hyperlipidemia    Hyperlipoproteinemia    Hypertension    Post traumatic stress disorder    Right ovarian cyst 10/31/2012   Complex right ovarian cyst seen 8/18 in ER need f/u US   Tendonitis    Vaginal Pap smear, abnormal     Patient Active Problem List   Diagnosis Date Noted   BV (bacterial vaginosis) 10/08/2019   Body mass index 38.0-38.9, adult 04/30/2019   Weight loss counseling, encounter for 04/30/2019   Body mass index 39.0-39.9, adult 11/27/2018   Carpal tunnel syndrome of right wrist    Condyloma acuminatum in female 02/24/2014   Abnormal Pap smear of cervix 11/24/2012   Asthma  11/24/2012   Bipolar 1 disorder ([address]) 10/31/2012    Past Surgical History:  Procedure Laterality Date   CARPAL TUNNEL RELEASE Right 06/28/2016   Procedure: RIGHT CARPAL TUNNEL RELEASE;  Surgeon: Carole Civil, MD;  Location: [facility];  Service: Orthopedics;  Laterality: Right;   HERNIA REPAIR     umbilical     OB History    Gravida  7   Para  6   Term  6   Preterm      AB  1   Living  4     SAB  1   IAB      Ectopic      Multiple      Live Births  77           Family History  Problem Relation Age of Onset   Diabetes Mother    Hypertension Mother    Asthma Father    Bronchitis Daughter    Asthma Daughter    Stroke Paternal Grandmother    Heart attack Paternal Grandmother    Cancer Paternal Grandmother    Asthma Son    Bronchitis Son    Asthma Daughter    Bronchitis Daughter    Asthma Son    Asthma Other    Bronchitis Other    Cancer Cousin     Social History  Tobacco Use   Smoking status: Never Smoker   Smokeless tobacco: Never Used  Vaping Use   Vaping Use: Never used  Substance Use Topics   Alcohol use: No   Drug use: No    Home Medications Prior to Admission medications   Medication Sig Start Date End Date Taking? Authorizing Provider  ibuprofen (ADVIL) 600 MG tablet Take 1 tablet (600 mg total) by mouth every 6 (six) hours as needed. 02/26/20  Yes Talen Poser, Almyra Free, PA-C  lisinopril (PRINIVIL,ZESTRIL) 10 MG tablet Take 5 mg by mouth daily.    Yes [provider]  metFORMIN (GLUCOPHAGE) 500 MG tablet Take 500 mg by mouth 2 (two) times daily with a meal.   Yes [provider]  ondansetron (ZOFRAN ODT) 4 MG disintegrating tablet Take 1 tablet (4 mg total) by mouth every 8 (eight) hours as needed for nausea or vomiting. 02/26/20  Yes Lindley Stachnik, Almyra Free, PA-C  pravastatin (PRAVACHOL) 40 MG tablet Take 40 mg by mouth daily.    Yes [provider]  PROAIR HFA 108 (90 Base) MCG/ACT inhaler Inhale 1-2  puffs into the lungs every 6 (six) hours as needed for wheezing. 02/15/20  Yes [provider]  SYMBICORT 160-4.5 MCG/ACT inhaler Inhale 1-2 puffs into the lungs daily. 11/19/19  Yes [provider]  acetaminophen (TYLENOL) 500 MG tablet Take 500 mg by mouth every 6 (six) hours as needed for mild pain.    [provider]  meloxicam (MOBIC) 7.5 MG tablet Take 7.5 mg by mouth daily.    [provider]  sulfamethoxazole-trimethoprim (BACTRIM DS) 800-160 MG tablet Take 1 tablet by mouth 2 (two) times daily for 5 days. 02/28/20 03/04/20  Evalee Jefferson, PA-C    Allergies    Darvocet [propoxyphene n-acetaminophen], Hydrocodone-acetaminophen, Lactose intolerance (gi), Other, Penicillins, Latex, Lortab [hydrocodone-acetaminophen], and Percocet [oxycodone-acetaminophen]  Review of Systems   Review of Systems  Constitutional: Negative for fever.  HENT: Negative for congestion.   Eyes: Negative.   Respiratory: Negative for chest tightness and shortness of breath.   Cardiovascular: Negative for chest pain.  Gastrointestinal: Positive for nausea. Negative for abdominal pain and vomiting.  Genitourinary: Positive for vaginal bleeding. Negative for pelvic pain and vaginal discharge.  Musculoskeletal: Negative for arthralgias, joint swelling and neck pain.  Skin: Negative.  Negative for rash and wound.  Neurological: Negative for dizziness, weakness, light-headedness, numbness and headaches.  Psychiatric/Behavioral: Negative.     Physical Exam Updated Vital Signs BP 120/81 (BP Location: Left Arm)    Pulse 88    Temp 98.7 F (37.1 C) (Oral)    Resp 18    Ht 5\' 4"  (1.626 m)    Wt 113.4 kg    LMP 02/15/2020    SpO2 100%    BMI 42.91 kg/m   Physical Exam Vitals and nursing note reviewed.  Constitutional:      Appearance: She is well-developed and well-nourished.  HENT:     Head: Normocephalic and atraumatic.  Eyes:     Conjunctiva/sclera: Conjunctivae normal.   Cardiovascular:     Rate and Rhythm: Normal rate and regular rhythm.     Pulses: Intact distal pulses.     Heart sounds: Normal heart sounds.  Pulmonary:     Effort: Pulmonary effort is normal.     Breath sounds: Normal breath sounds. No wheezing.  Chest:     Chest wall: Tenderness present. No mass or deformity.  Breasts:     Right: No nipple discharge.     Left:  No nipple discharge.      Comments: No axillary adenopathy Abdominal:     General: Abdomen is protuberant. Bowel sounds are normal. There is no distension.     Palpations: Abdomen is soft.     Tenderness: There is no abdominal tenderness.  Genitourinary:    Comments: Deferred. Musculoskeletal:        General: Normal range of motion.     Cervical back: Normal range of motion.  Skin:    General: Skin is warm and dry.  Neurological:     Mental Status: She is alert.  Psychiatric:        Mood and Affect: Mood and affect normal.     ED Results / Procedures / Treatments   Labs (all labs ordered are listed, but only abnormal results are displayed) Labs Reviewed  URINALYSIS, ROUTINE W REFLEX MICROSCOPIC - Abnormal; Notable for the following components:      Result Value   APPearance HAZY (*)    Hgb urine dipstick LARGE (*)    Protein, ur 30 (*)    Nitrite POSITIVE (*)    Leukocytes,Ua SMALL (*)    Bacteria, UA MANY (*)    All other components within normal limits  COMPREHENSIVE METABOLIC PANEL - Abnormal; Notable for the following components:   Alkaline Phosphatase 147 (*)    All other components within normal limits  HCG, QUANTITATIVE, PREGNANCY  CBC WITH DIFFERENTIAL/PLATELET  LIPASE, BLOOD  POC URINE PREG, ED    EKG None  Radiology No results found.  Procedures Procedures (including critical care time)  Medications Ordered in ED Medications  ondansetron (ZOFRAN-ODT) disintegrating tablet 4 mg (4 mg Oral Given 02/26/20 1402)    ED Course  I have reviewed the triage vital signs and the nursing  notes.  Pertinent labs & imaging results that were available during my care of the patient were reviewed by me and considered in my medical decision making (see chart for details).    MDM Rules/Calculators/A&P                          Labs and imaging reviewed.  Pt is not pregnant, UA positive for uti, this result was noted after pt dc'd home.  Prescription for bactrim sent to pharmacy and pt notified of prescription.  Labs otherwise stable.  Advised f/u with [facility] for further eval if sx persist.  Final Clinical Impression(s) / ED Diagnoses Final diagnoses:  Breast tenderness in female  Nausea  Vaginal spotting  Acute cystitis with hematuria    Rx / DC Orders ED Discharge Orders         Ordered    ondansetron (ZOFRAN ODT) 4 MG disintegrating tablet  Every 8 hours PRN        02/26/20 1423    ibuprofen (ADVIL) 600 MG tablet  Every 6 hours PRN        02/26/20 1425           Evalee Jefferson, PA-C 02/28/20 0031    Margette Fast, MD 03/03/20 1021

## 2020-02-27 NOTE — ED Provider Notes (Incomplete)
[facility] Provider Note   CSN: [id number] Arrival date & time: 02/26/20  1109     History Chief Complaint  Patient presents with  . Vaginal Bleeding    Breanna Malone is a 40 y.o. female with past history of asthma, DM, HTN, G2P2, presenting with symptoms concerning for possible pregnancy. She describes a 4 day history of bilateral breast tenderness, nausea without emesis and abnormal vaginal bleeding.  Her LMP was 02/15/20, lighter than normal but lasted her typical 4 days, then she developed vaginal bleeding 4 days ago similar in flow to normal period for a day, with mild spotting since.  She denies pelvic pain, vaginal discharge, also no fevers, chills, abdominal pain or distention, dysuria, breast mass or nipple discharge.  She is sexually active with no contraceptive use.  She does report she was 5 months pregnant with her youngest child before she new she was pregnant.  HPI     Past Medical History:  Diagnosis Date  . Abnormal Pap smear   . Arthritis   . Asthma   . Asthma   . Bipolar 1 disorder ([place])   . Bronchitis   . BV (bacterial vaginosis) 10/08/2019   8/19 rx flagyl  . Carpal tunnel syndrome   . Cervical spine pain   . Chest pain   . Diabetes mellitus without complication ([place])   . Elevated cholesterol   . High cholesterol   . Hyperlipidemia   . Hyperlipoproteinemia   . Hypertension   . Post traumatic stress disorder   . Right ovarian cyst 10/31/2012   Complex right ovarian cyst seen 8/18 in ER need f/u US  . Tendonitis   . Vaginal Pap smear, abnormal     Patient Active Problem List   Diagnosis Date Noted  . BV (bacterial vaginosis) 10/08/2019  . Body mass index 38.0-38.9, adult 04/30/2019  . Weight loss counseling, encounter for 04/30/2019  . Body mass index 39.0-39.9, adult 11/27/2018  . Carpal tunnel syndrome of right wrist   . Condyloma acuminatum in female 02/24/2014  . Abnormal Pap smear of cervix 11/24/2012  . Asthma  11/24/2012  . Bipolar 1 disorder ([place]) 10/31/2012    Past Surgical History:  Procedure Laterality Date  . CARPAL TUNNEL RELEASE Right 06/28/2016   Procedure: RIGHT CARPAL TUNNEL RELEASE;  Surgeon: Carole Civil, MD;  Location: [facility];  Service: Orthopedics;  Laterality: Right;  . HERNIA REPAIR     umbilical     OB History    Gravida  7   Para  6   Term  6   Preterm      AB  1   Living  4     SAB  1   IAB      Ectopic      Multiple      Live Births  6           Family History  Problem Relation Age of Onset  . Diabetes Mother   . Hypertension Mother   . Asthma Father   . Bronchitis Daughter   . Asthma Daughter   . Stroke Paternal Grandmother   . Heart attack Paternal Grandmother   . Cancer Paternal Grandmother   . Asthma Son   . Bronchitis Son   . Asthma Daughter   . Bronchitis Daughter   . Asthma Son   . Asthma Other   . Bronchitis Other   . Cancer Cousin     Social History  Tobacco Use  . Smoking status: Never Smoker  . Smokeless tobacco: Never Used  Vaping Use  . Vaping Use: Never used  Substance Use Topics  . Alcohol use: No  . Drug use: No    Home Medications Prior to Admission medications   Medication Sig Start Date End Date Taking? Authorizing Provider  ibuprofen (ADVIL) 600 MG tablet Take 1 tablet (600 mg total) by mouth every 6 (six) hours as needed. 02/26/20  Yes Feleica Fulmore, Almyra Free, PA-C  lisinopril (PRINIVIL,ZESTRIL) 10 MG tablet Take 5 mg by mouth daily.    Yes [provider]  metFORMIN (GLUCOPHAGE) 500 MG tablet Take 500 mg by mouth 2 (two) times daily with a meal.   Yes [provider]  ondansetron (ZOFRAN ODT) 4 MG disintegrating tablet Take 1 tablet (4 mg total) by mouth every 8 (eight) hours as needed for nausea or vomiting. 02/26/20  Yes Akyla Vavrek, Almyra Free, PA-C  pravastatin (PRAVACHOL) 40 MG tablet Take 40 mg by mouth daily.    Yes [provider]  PROAIR HFA 108 (90 Base) MCG/ACT inhaler Inhale 1-2  puffs into the lungs every 6 (six) hours as needed for wheezing. 02/15/20  Yes [provider]  SYMBICORT 160-4.5 MCG/ACT inhaler Inhale 1-2 puffs into the lungs daily. 11/19/19  Yes [provider]  acetaminophen (TYLENOL) 500 MG tablet Take 500 mg by mouth every 6 (six) hours as needed for mild pain.    [provider]  meloxicam (MOBIC) 7.5 MG tablet Take 7.5 mg by mouth daily.    [provider]    Allergies    Darvocet [propoxyphene n-acetaminophen], Hydrocodone-acetaminophen, Lactose intolerance (gi), Other, Penicillins, Latex, Lortab [hydrocodone-acetaminophen], and Percocet [oxycodone-acetaminophen]  Review of Systems   Review of Systems  Constitutional: Negative for fever.  HENT: Negative for congestion.   Eyes: Negative.   Respiratory: Negative for chest tightness and shortness of breath.   Cardiovascular: Negative for chest pain.  Gastrointestinal: Positive for nausea. Negative for abdominal pain and vomiting.  Genitourinary: Positive for vaginal bleeding. Negative for pelvic pain and vaginal discharge.  Musculoskeletal: Negative for arthralgias, joint swelling and neck pain.  Skin: Negative.  Negative for rash and wound.  Neurological: Negative for dizziness, weakness, light-headedness, numbness and headaches.  Psychiatric/Behavioral: Negative.     Physical Exam Updated Vital Signs BP 120/81 (BP Location: Left Arm)   Pulse 88   Temp 98.7 F (37.1 C) (Oral)   Resp 18   Ht 5\' 4"  (1.626 m)   Wt 113.4 kg   LMP 02/15/2020   SpO2 100%   BMI 42.91 kg/m   Physical Exam  ED Results / Procedures / Treatments   Labs (all labs ordered are listed, but only abnormal results are displayed) Labs Reviewed  URINALYSIS, ROUTINE W REFLEX MICROSCOPIC - Abnormal; Notable for the following components:      Result Value   APPearance HAZY (*)    Hgb urine dipstick LARGE (*)    Protein, ur 30 (*)    Nitrite POSITIVE (*)    Leukocytes,Ua SMALL (*)     Bacteria, UA MANY (*)    All other components within normal limits  COMPREHENSIVE METABOLIC PANEL - Abnormal; Notable for the following components:   Alkaline Phosphatase 147 (*)    All other components within normal limits  HCG, QUANTITATIVE, PREGNANCY  CBC WITH DIFFERENTIAL/PLATELET  LIPASE, BLOOD  POC URINE PREG, ED    EKG None  Radiology No results found.  Procedures Procedures (including critical care time)  Medications  Ordered in ED Medications  ondansetron (ZOFRAN-ODT) disintegrating tablet 4 mg (4 mg Oral Given 02/26/20 1402)    ED Course  I have reviewed the triage vital signs and the nursing notes.  Pertinent labs & imaging results that were available during my care of the patient were reviewed by me and considered in my medical decision making (see chart for details).    MDM Rules/Calculators/A&P                          *** Final Clinical Impression(s) / ED Diagnoses Final diagnoses:  Breast tenderness in female  Nausea  Vaginal spotting    Rx / DC Orders ED Discharge Orders         Ordered    ondansetron (ZOFRAN ODT) 4 MG disintegrating tablet  Every 8 hours PRN        02/26/20 1423    ibuprofen (ADVIL) 600 MG tablet  Every 6 hours PRN        02/26/20 1425

## 2020-02-28 MED ORDER — SULFAMETHOXAZOLE-TRIMETHOPRIM 800-160 MG PO TABS: 1.0000 | ORAL_TABLET | Freq: Two times a day (BID) | ORAL | 0 refills | Status: AC

## 2020-03-08 ENCOUNTER — Encounter: Payer: Medicaid Other | Admitting: Neurology

## 2020-03-09 ENCOUNTER — Other Ambulatory Visit: Payer: Self-pay

## 2020-03-09 ENCOUNTER — Ambulatory Visit (HOSPITAL_COMMUNITY)
Admission: RE | Admit: 2020-03-09 | Discharge: 2020-03-09 | Disposition: A | Discharge status: Home / Self Care | Payer: Medicaid Other | Source: Ambulatory Visit | Attending: Internal Medicine | Admitting: Internal Medicine

## 2020-03-09 DIAGNOSIS — Z1231 Encounter for screening mammogram for malignant neoplasm of breast: Secondary | ICD-10-CM | POA: Diagnosis not present

## 2020-03-22 ENCOUNTER — Encounter (HOSPITAL_COMMUNITY): Payer: Self-pay | Admitting: *Deleted

## 2020-03-22 ENCOUNTER — Emergency Department (HOSPITAL_COMMUNITY)
Admission: EM | Admit: 2020-03-22 | Discharge: 2020-03-22 | Disposition: A | Discharge status: Home / Self Care | Payer: Medicaid Other | Attending: Emergency Medicine | Admitting: Emergency Medicine

## 2020-03-22 ENCOUNTER — Other Ambulatory Visit: Payer: Self-pay

## 2020-03-22 DIAGNOSIS — N644 Mastodynia: Secondary | ICD-10-CM | POA: Diagnosis not present

## 2020-03-22 DIAGNOSIS — M545 Low back pain, unspecified: Secondary | ICD-10-CM | POA: Diagnosis not present

## 2020-03-22 DIAGNOSIS — Z5321 Procedure and treatment not carried out due to patient leaving prior to being seen by health care provider: Secondary | ICD-10-CM | POA: Diagnosis not present

## 2020-03-22 NOTE — ED Triage Notes (Signed)
Pt with breast pain, "knot" in her stomach and lower back pain since Friday morning.  Denies any fevers or n/V/D

## 2020-03-23 ENCOUNTER — Other Ambulatory Visit: Payer: Self-pay

## 2020-03-23 ENCOUNTER — Encounter (HOSPITAL_COMMUNITY): Payer: Self-pay

## 2020-03-23 DIAGNOSIS — R1084 Generalized abdominal pain: Secondary | ICD-10-CM | POA: Insufficient documentation

## 2020-03-23 DIAGNOSIS — I10 Essential (primary) hypertension: Secondary | ICD-10-CM | POA: Diagnosis not present

## 2020-03-23 DIAGNOSIS — J45909 Unspecified asthma, uncomplicated: Secondary | ICD-10-CM | POA: Diagnosis not present

## 2020-03-23 DIAGNOSIS — Z7984 Long term (current) use of oral hypoglycemic drugs: Secondary | ICD-10-CM | POA: Diagnosis not present

## 2020-03-23 DIAGNOSIS — Z91040 Latex allergy status: Secondary | ICD-10-CM | POA: Diagnosis not present

## 2020-03-23 DIAGNOSIS — Z79899 Other long term (current) drug therapy: Secondary | ICD-10-CM | POA: Diagnosis not present

## 2020-03-23 DIAGNOSIS — E119 Type 2 diabetes mellitus without complications: Secondary | ICD-10-CM | POA: Insufficient documentation

## 2020-03-23 NOTE — ED Triage Notes (Signed)
Pt to er, pt states that she has been having abd pain and some lower back pain, states that she tried to go to her OBGYN but they didn't have any room, states that she was here yesterday and the wait was too long. Denies vomiting or diarrhea.

## 2020-03-24 ENCOUNTER — Emergency Department (HOSPITAL_COMMUNITY): Payer: Medicaid Other

## 2020-03-24 ENCOUNTER — Emergency Department (HOSPITAL_COMMUNITY)
Admission: EM | Admit: 2020-03-24 | Discharge: 2020-03-24 | Disposition: A | Discharge status: Home / Self Care | Payer: Medicaid Other | Attending: Emergency Medicine | Admitting: Emergency Medicine

## 2020-03-24 ENCOUNTER — Ambulatory Visit: Payer: Medicaid Other | Admitting: Women's Health

## 2020-03-24 DIAGNOSIS — R109 Unspecified abdominal pain: Secondary | ICD-10-CM

## 2020-03-24 LAB — URINALYSIS, ROUTINE W REFLEX MICROSCOPIC
Bilirubin Urine: NEGATIVE
Glucose, UA: NEGATIVE mg/dL
Ketones, ur: NEGATIVE mg/dL
Nitrite: NEGATIVE
Protein, ur: NEGATIVE mg/dL
Specific Gravity, Urine: 1.026 (ref 1.005–1.030)
pH: 6 (ref 5.0–8.0)

## 2020-03-24 LAB — COMPREHENSIVE METABOLIC PANEL
ALT: 31 U/L (ref 0–44)
AST: 28 U/L (ref 15–41)
Albumin: 4 g/dL (ref 3.5–5.0)
Alkaline Phosphatase: 128 U/L — ABNORMAL HIGH (ref 38–126)
Anion gap: 6 (ref 5–15)
BUN: 10 mg/dL (ref 6–20)
CO2: 27 mmol/L (ref 22–32)
Calcium: 9.2 mg/dL (ref 8.9–10.3)
Chloride: 104 mmol/L (ref 98–111)
Creatinine, Ser: 0.78 mg/dL (ref 0.44–1.00)
GFR, Estimated: >60 mL/min (ref >60)
Glucose, Bld: 110 mg/dL — ABNORMAL HIGH (ref 70–99)
Potassium: 4 mmol/L (ref 3.5–5.1)
Sodium: 137 mmol/L (ref 135–145)
Total Bilirubin: 0.4 mg/dL (ref 0.3–1.2)
Total Protein: 7.5 g/dL (ref 6.5–8.1)

## 2020-03-24 LAB — CBC WITH DIFFERENTIAL/PLATELET
Abs Immature Granulocytes: 0.02 10*3/uL (ref 0.00–0.07)
Basophils Absolute: 0 10*3/uL (ref 0.0–0.1)
Basophils Relative: 0 %
Eosinophils Absolute: 0.1 10*3/uL (ref 0.0–0.5)
Eosinophils Relative: 1 %
HCT: 35.8 % — ABNORMAL LOW (ref 36.0–46.0)
Hemoglobin: 11.3 g/dL — ABNORMAL LOW (ref 12.0–15.0)
Immature Granulocytes: 0 %
Lymphocytes Relative: 39 %
Lymphs Abs: 3.6 10*3/uL (ref 0.7–4.0)
MCH: 31.1 pg (ref 26.0–34.0)
MCHC: 31.6 g/dL (ref 30.0–36.0)
MCV: 98.6 fL (ref 80.0–100.0)
Monocytes Absolute: 0.5 10*3/uL (ref 0.1–1.0)
Monocytes Relative: 6 %
Neutro Abs: 5 10*3/uL (ref 1.7–7.7)
Neutrophils Relative %: 54 %
Platelets: 304 10*3/uL (ref 150–400)
RBC: 3.63 MIL/uL — ABNORMAL LOW (ref 3.87–5.11)
RDW: 11.9 % (ref 11.5–15.5)
WBC: 9.2 10*3/uL (ref 4.0–10.5)
nRBC: 0 % (ref 0.0–0.2)

## 2020-03-24 LAB — I-STAT BETA HCG BLOOD, ED (MC, WL, AP ONLY): I-stat hCG, quantitative: <5 m[IU]/mL (ref <5)

## 2020-03-24 LAB — LIPASE, BLOOD: Lipase: 34 U/L (ref 11–51)

## 2020-03-24 NOTE — ED Notes (Signed)
ED Provider at bedside. 

## 2020-03-24 NOTE — ED Provider Notes (Signed)
[facility] EMERGENCY DEPARTMENT Provider Note   CSN: [id number] Arrival date & time: 03/23/20  2206     History Chief Complaint  Patient presents with  . Abdominal Pain    Breanna Malone is a 40 y.o. female.  Patient is a 40 year old female with history of asthma, diabetes, hypertension, bipolar.  She presents today for evaluation of abdominal pain.  Patient reports a 3-day history of discomfort to the bilateral flanks radiating into her lower abdomen.  Pain is worse when she moves and bends.  She denies any bowel or bladder complaints.  She denies any fevers or chills.  The history is provided by the patient.  Abdominal Pain Pain location:  Generalized Pain radiates to:  L flank and R flank Pain severity:  Moderate Onset quality:  Sudden Duration:  3 days Timing:  Constant Progression:  Worsening Chronicity:  New      Past Medical History:  Diagnosis Date  . Abnormal Pap smear   . Arthritis   . Asthma   . Asthma   . Bipolar 1 disorder ([place])   . Bronchitis   . BV (bacterial vaginosis) 10/08/2019   8/19 rx flagyl  . Carpal tunnel syndrome   . Cervical spine pain   . Chest pain   . Diabetes mellitus without complication ([place])   . Elevated cholesterol   . High cholesterol   . Hyperlipidemia   . Hyperlipoproteinemia   . Hypertension   . Post traumatic stress disorder   . Right ovarian cyst 10/31/2012   Complex right ovarian cyst seen 8/18 in ER need f/u US  . Tendonitis   . Vaginal Pap smear, abnormal     Patient Active Problem List   Diagnosis Date Noted  . BV (bacterial vaginosis) 10/08/2019  . Body mass index 38.0-38.9, adult 04/30/2019  . Weight loss counseling, encounter for 04/30/2019  . Body mass index 39.0-39.9, adult 11/27/2018  . Carpal tunnel syndrome of right wrist   . Condyloma acuminatum in female 02/24/2014  . Abnormal Pap smear of cervix 11/24/2012  . Asthma 11/24/2012  . Bipolar 1 disorder ([place]) 10/31/2012    Past Surgical History:   Procedure Laterality Date  . CARPAL TUNNEL RELEASE Right 06/28/2016   Procedure: RIGHT CARPAL TUNNEL RELEASE;  Surgeon: Carole Civil, MD;  Location: [facility];  Service: Orthopedics;  Laterality: Right;  . HERNIA REPAIR     umbilical     OB History    Gravida  7   Para  6   Term  6   Preterm      AB  1   Living  4     SAB  1   IAB      Ectopic      Multiple      Live Births  6           Family History  Problem Relation Age of Onset  . Diabetes Mother   . Hypertension Mother   . Asthma Father   . Bronchitis Daughter   . Asthma Daughter   . Stroke Paternal Grandmother   . Heart attack Paternal Grandmother   . Cancer Paternal Grandmother   . Asthma Son   . Bronchitis Son   . Asthma Daughter   . Bronchitis Daughter   . Asthma Son   . Asthma Other   . Bronchitis Other   . Cancer Cousin     Social History   Tobacco Use  . Smoking status: Never Smoker  .  Smokeless tobacco: Never Used  Vaping Use  . Vaping Use: Never used  Substance Use Topics  . Alcohol use: No  . Drug use: No    Home Medications Prior to Admission medications   Medication Sig Start Date End Date Taking? Authorizing Provider  acetaminophen (TYLENOL) 500 MG tablet Take 500 mg by mouth every 6 (six) hours as needed for mild pain.    [provider]  ibuprofen (ADVIL) 600 MG tablet Take 1 tablet (600 mg total) by mouth every 6 (six) hours as needed. 02/26/20   Evalee Jefferson, PA-C  lisinopril (PRINIVIL,ZESTRIL) 10 MG tablet Take 5 mg by mouth daily.     [provider]  meloxicam (MOBIC) 7.5 MG tablet Take 7.5 mg by mouth daily.    [provider]  metFORMIN (GLUCOPHAGE) 500 MG tablet Take 500 mg by mouth 2 (two) times daily with a meal.    [provider]  ondansetron (ZOFRAN ODT) 4 MG disintegrating tablet Take 1 tablet (4 mg total) by mouth every 8 (eight) hours as needed for nausea or vomiting. 02/26/20   Evalee Jefferson, PA-C  pravastatin  (PRAVACHOL) 40 MG tablet Take 40 mg by mouth daily.     [provider]  PROAIR HFA 108 [phone] Base) MCG/ACT inhaler Inhale 1-2 puffs into the lungs every 6 (six) hours as needed for wheezing. 02/15/20   [provider]  SYMBICORT 160-4.5 MCG/ACT inhaler Inhale 1-2 puffs into the lungs daily. 11/19/19   [provider]    Allergies    Darvocet [propoxyphene n-acetaminophen], Hydrocodone-acetaminophen, Lactose intolerance (gi), Other, Penicillins, Latex, Lortab [hydrocodone-acetaminophen], and Percocet [oxycodone-acetaminophen]  Review of Systems   Review of Systems  Gastrointestinal: Positive for abdominal pain.  All other systems reviewed and are negative.   Physical Exam Updated Vital Signs BP 130/84 (BP Location: Right Arm)   Pulse 84   Temp 98.6 F (37 C) (Oral)   Resp 18   Ht 5\' 4"  (1.626 m)   Wt 107.5 kg   LMP 03/18/2020   SpO2 98%   BMI 40.68 kg/m   Physical Exam Vitals and nursing note reviewed.  Constitutional:      General: She is not in acute distress.    Appearance: She is well-developed and well-nourished. She is not diaphoretic.  HENT:     Head: Normocephalic and atraumatic.  Cardiovascular:     Rate and Rhythm: Normal rate and regular rhythm.     Heart sounds: No murmur heard. No friction rub. No gallop.   Pulmonary:     Effort: Pulmonary effort is normal. No respiratory distress.     Breath sounds: Normal breath sounds. No wheezing.  Abdominal:     General: Bowel sounds are normal. There is no distension.     Palpations: Abdomen is soft.     Tenderness: There is generalized abdominal tenderness. There is right CVA tenderness and left CVA tenderness. There is no guarding or rebound.  Musculoskeletal:        General: Normal range of motion.     Cervical back: Normal range of motion and neck supple.  Skin:    General: Skin is warm and dry.  Neurological:     Mental Status: She is alert and oriented to person, place, and time.      ED Results / Procedures / Treatments   Labs (all labs ordered are listed, but only abnormal results are displayed) Labs Reviewed  COMPREHENSIVE METABOLIC PANEL  LIPASE, BLOOD  CBC WITH DIFFERENTIAL/PLATELET  URINALYSIS, ROUTINE W REFLEX MICROSCOPIC    EKG None  Radiology No results found.  Procedures Procedures   Medications Ordered in ED Medications - No data to display  ED Course  I have reviewed the triage vital signs and the nursing notes.  Pertinent labs & imaging results that were available during my care of the patient were reviewed by me and considered in my medical decision making (see chart for details).    MDM Rules/Calculators/A&P  Patient presenting with bilateral flank pain radiating to the lower abdomen for the past several days. Her abdominal exam is benign and CT scan shows no acute intra-abdominal process. She does have a lesion within her left ninth rib that seems to have changed in appearance and size from prior studies. Radiology has recommended outpatient follow-up for this.  Remainder of her laboratory studies are unremarkable and urinalysis is clear. At this point nothing emergent has shown up and I feel as though discharge is appropriate.  Final Clinical Impression(s) / ED Diagnoses Final diagnoses:  None    Rx / DC Orders ED Discharge Orders    None       Veryl Speak, MD 03/24/20 [phone]

## 2020-03-24 NOTE — Discharge Instructions (Addendum)
Take ibuprofen 600 mg every 6 hours as needed for pain.  Follow-up with your primary doctor in the next week to address the abnormal finding in your left ninth rib that was seen on today's CAT scan.   Return to the emergency department in the meantime if you develop worsening pain, high fever, bloody stools, or other new and concerning symptoms.

## 2020-04-05 ENCOUNTER — Other Ambulatory Visit: Payer: Self-pay | Admitting: Gerontology

## 2020-04-05 ENCOUNTER — Other Ambulatory Visit (HOSPITAL_COMMUNITY): Payer: Self-pay | Admitting: Gerontology

## 2020-04-05 ENCOUNTER — Encounter: Payer: Medicaid Other | Admitting: Neurology

## 2020-04-05 DIAGNOSIS — D169 Benign neoplasm of bone and articular cartilage, unspecified: Secondary | ICD-10-CM

## 2020-04-14 ENCOUNTER — Other Ambulatory Visit: Payer: Self-pay | Admitting: Gerontology

## 2020-04-14 ENCOUNTER — Other Ambulatory Visit (HOSPITAL_COMMUNITY): Payer: Self-pay | Admitting: Gerontology

## 2020-04-14 DIAGNOSIS — D169 Benign neoplasm of bone and articular cartilage, unspecified: Secondary | ICD-10-CM

## 2020-04-18 ENCOUNTER — Ambulatory Visit (HOSPITAL_COMMUNITY): Payer: Medicaid Other

## 2020-04-18 ENCOUNTER — Encounter (HOSPITAL_COMMUNITY): Payer: Self-pay

## 2020-04-19 ENCOUNTER — Telehealth: Payer: Self-pay | Admitting: Adult Health

## 2020-04-19 NOTE — Telephone Encounter (Signed)
Patient states she is still having breast tenderness and not feeling like herself.  She had a mammogram back in January that was normal.  Patient state she is sexually active and not using protection.  I discussed if she is having periods and having unprotected intercourse, there is always a chance she could get pregnant.  Advised to come to her next scheduled appt to have a full assessment and discuss birth control.  Pt verbalized understanding and agreeable to do so.

## 2020-04-19 NOTE — Telephone Encounter (Signed)
Pt called concerned with an irregular cycle in February, states having pain in both breasts, tender, flow unusually light, not feeling like herself, wants to talk to the nurse  Please advise & call pt  (has PAP/physical scheduled for 04/27/2020 w/Jennifer)

## 2020-04-20 ENCOUNTER — Encounter: Payer: Medicaid Other | Admitting: Neurology

## 2020-04-27 ENCOUNTER — Other Ambulatory Visit: Payer: Medicaid Other | Admitting: Adult Health

## 2020-04-28 ENCOUNTER — Encounter (HOSPITAL_COMMUNITY): Payer: Self-pay

## 2020-04-28 ENCOUNTER — Ambulatory Visit (HOSPITAL_COMMUNITY): Payer: Medicaid Other

## 2020-04-28 ENCOUNTER — Ambulatory Visit (HOSPITAL_COMMUNITY): Admission: RE | Admit: 2020-04-28 | Payer: Medicaid Other | Source: Ambulatory Visit

## 2020-05-02 ENCOUNTER — Encounter (HOSPITAL_COMMUNITY): Payer: Self-pay

## 2020-05-02 ENCOUNTER — Emergency Department (HOSPITAL_COMMUNITY)
Admission: EM | Admit: 2020-05-02 | Discharge: 2020-05-02 | Disposition: A | Discharge status: Home / Self Care | Payer: Medicaid Other | Attending: Emergency Medicine | Admitting: Emergency Medicine

## 2020-05-02 ENCOUNTER — Other Ambulatory Visit: Payer: Self-pay

## 2020-05-02 DIAGNOSIS — R110 Nausea: Secondary | ICD-10-CM | POA: Diagnosis not present

## 2020-05-02 DIAGNOSIS — I10 Essential (primary) hypertension: Secondary | ICD-10-CM | POA: Insufficient documentation

## 2020-05-02 DIAGNOSIS — J45909 Unspecified asthma, uncomplicated: Secondary | ICD-10-CM | POA: Diagnosis not present

## 2020-05-02 DIAGNOSIS — Z79899 Other long term (current) drug therapy: Secondary | ICD-10-CM | POA: Diagnosis not present

## 2020-05-02 DIAGNOSIS — Z91040 Latex allergy status: Secondary | ICD-10-CM | POA: Diagnosis not present

## 2020-05-02 DIAGNOSIS — M7989 Other specified soft tissue disorders: Secondary | ICD-10-CM | POA: Diagnosis not present

## 2020-05-02 DIAGNOSIS — Z7984 Long term (current) use of oral hypoglycemic drugs: Secondary | ICD-10-CM | POA: Diagnosis not present

## 2020-05-02 DIAGNOSIS — R1030 Lower abdominal pain, unspecified: Secondary | ICD-10-CM | POA: Insufficient documentation

## 2020-05-02 DIAGNOSIS — E119 Type 2 diabetes mellitus without complications: Secondary | ICD-10-CM | POA: Diagnosis not present

## 2020-05-02 LAB — CBC WITH DIFFERENTIAL/PLATELET
Abs Immature Granulocytes: 0.02 10*3/uL (ref 0.00–0.07)
Basophils Absolute: 0 10*3/uL (ref 0.0–0.1)
Basophils Relative: 0 %
Eosinophils Absolute: 0.1 10*3/uL (ref 0.0–0.5)
Eosinophils Relative: 1 %
HCT: 36.6 % (ref 36.0–46.0)
Hemoglobin: 11.7 g/dL — ABNORMAL LOW (ref 12.0–15.0)
Immature Granulocytes: 0 %
Lymphocytes Relative: 39 %
Lymphs Abs: 3.3 10*3/uL (ref 0.7–4.0)
MCH: 31.4 pg (ref 26.0–34.0)
MCHC: 32 g/dL (ref 30.0–36.0)
MCV: 98.1 fL (ref 80.0–100.0)
Monocytes Absolute: 0.5 10*3/uL (ref 0.1–1.0)
Monocytes Relative: 6 %
Neutro Abs: 4.5 10*3/uL (ref 1.7–7.7)
Neutrophils Relative %: 54 %
Platelets: 315 10*3/uL (ref 150–400)
RBC: 3.73 MIL/uL — ABNORMAL LOW (ref 3.87–5.11)
RDW: 12 % (ref 11.5–15.5)
WBC: 8.3 10*3/uL (ref 4.0–10.5)
nRBC: 0 % (ref 0.0–0.2)

## 2020-05-02 LAB — COMPREHENSIVE METABOLIC PANEL
ALT: 32 U/L (ref 0–44)
AST: 27 U/L (ref 15–41)
Albumin: 3.9 g/dL (ref 3.5–5.0)
Alkaline Phosphatase: 127 U/L — ABNORMAL HIGH (ref 38–126)
Anion gap: 7 (ref 5–15)
BUN: 10 mg/dL (ref 6–20)
CO2: 24 mmol/L (ref 22–32)
Calcium: 8.8 mg/dL — ABNORMAL LOW (ref 8.9–10.3)
Chloride: 106 mmol/L (ref 98–111)
Creatinine, Ser: 0.68 mg/dL (ref 0.44–1.00)
GFR, Estimated: >60 mL/min (ref >60)
Glucose, Bld: 91 mg/dL (ref 70–99)
Potassium: 4 mmol/L (ref 3.5–5.1)
Sodium: 137 mmol/L (ref 135–145)
Total Bilirubin: 0.4 mg/dL (ref 0.3–1.2)
Total Protein: 7.3 g/dL (ref 6.5–8.1)

## 2020-05-02 LAB — LIPASE, BLOOD: Lipase: 29 U/L (ref 11–51)

## 2020-05-02 NOTE — ED Notes (Signed)
Pt told this nurse and dr Rogene Houston that she needed to leave to get her child from school. md at bedside speaking with pt. Pt made this nurse and EDP aware that she was going to return this afternoon.

## 2020-05-02 NOTE — ED Provider Notes (Signed)
[facility] EMERGENCY DEPARTMENT Provider Note   CSN: [id number] Arrival date & time: 05/02/20  0941     History Chief Complaint  Patient presents with  . Multiple Complaints    Breanna Malone is a 40 y.o. female.  Patient concerned about being pregnant because her menstrual periods have been irregular.  Patient talks about some lower abdominal pain discomfort.  No vaginal bleeding.  Swelling in her hands and legs and has been having some intermittent nausea.  Past medical history significant for asthma hyperlipidemia bipolar disorder right ovarian cyst and diabetes without complications.  Review shows evidence of a sort of ninth rib lesion.  Is been seen in the past.        Past Medical History:  Diagnosis Date  . Abnormal Pap smear   . Arthritis   . Asthma   . Asthma   . Bipolar 1 disorder ([place])   . Bronchitis   . BV (bacterial vaginosis) 10/08/2019   8/19 rx flagyl  . Carpal tunnel syndrome   . Cervical spine pain   . Chest pain   . Diabetes mellitus without complication ([place])   . Elevated cholesterol   . High cholesterol   . Hyperlipidemia   . Hyperlipoproteinemia   . Hypertension   . Post traumatic stress disorder   . Right ovarian cyst 10/31/2012   Complex right ovarian cyst seen 8/18 in ER need f/u US  . Tendonitis   . Vaginal Pap smear, abnormal     Patient Active Problem List   Diagnosis Date Noted  . BV (bacterial vaginosis) 10/08/2019  . Body mass index 38.0-38.9, adult 04/30/2019  . Weight loss counseling, encounter for 04/30/2019  . Body mass index 39.0-39.9, adult 11/27/2018  . Carpal tunnel syndrome of right wrist   . Condyloma acuminatum in female 02/24/2014  . Abnormal Pap smear of cervix 11/24/2012  . Asthma 11/24/2012  . Bipolar 1 disorder ([place]) 10/31/2012    Past Surgical History:  Procedure Laterality Date  . CARPAL TUNNEL RELEASE Right 06/28/2016   Procedure: RIGHT CARPAL TUNNEL RELEASE;  Surgeon: Carole Civil, MD;  Location:  [facility];  Service: Orthopedics;  Laterality: Right;  . HERNIA REPAIR     umbilical     OB History    Gravida  7   Para  6   Term  6   Preterm      AB  1   Living  4     SAB  1   IAB      Ectopic      Multiple      Live Births  6           Family History  Problem Relation Age of Onset  . Diabetes Mother   . Hypertension Mother   . Asthma Father   . Bronchitis Daughter   . Asthma Daughter   . Stroke Paternal Grandmother   . Heart attack Paternal Grandmother   . Cancer Paternal Grandmother   . Asthma Son   . Bronchitis Son   . Asthma Daughter   . Bronchitis Daughter   . Asthma Son   . Asthma Other   . Bronchitis Other   . Cancer Cousin     Social History   Tobacco Use  . Smoking status: Never Smoker  . Smokeless tobacco: Never Used  Vaping Use  . Vaping Use: Never used  Substance Use Topics  . Alcohol use: No  . Drug use: No  Home Medications Prior to Admission medications   Medication Sig Start Date End Date Taking? Authorizing Provider  acetaminophen (TYLENOL) 500 MG tablet Take 500 mg by mouth every 6 (six) hours as needed for mild pain.   Yes [provider]  hydrochlorothiazide (HYDRODIURIL) 12.5 MG tablet Take 6.25 mg by mouth daily. 04/28/20  Yes [provider]  lisinopril (PRINIVIL,ZESTRIL) 10 MG tablet Take 5 mg by mouth daily.    Yes [provider]  lisinopril (ZESTRIL) 5 MG tablet Take 5 mg by mouth daily. 04/05/20  Yes [provider]  metFORMIN (GLUCOPHAGE) 500 MG tablet Take 500 mg by mouth 2 (two) times daily with a meal.   Yes [provider]  pravastatin (PRAVACHOL) 40 MG tablet Take 40 mg by mouth daily.    Yes [provider]  PROAIR HFA 108 (90 Base) MCG/ACT inhaler Inhale 1-2 puffs into the lungs every 6 (six) hours as needed for wheezing. 02/15/20  Yes [provider]  SYMBICORT 160-4.5 MCG/ACT inhaler Inhale 1-2 puffs into the lungs daily. 11/19/19  Yes  [provider]  ibuprofen (ADVIL) 600 MG tablet Take 1 tablet (600 mg total) by mouth every 6 (six) hours as needed. Patient not taking: No sig reported 02/26/20   Evalee Jefferson, PA-C  ondansetron (ZOFRAN ODT) 4 MG disintegrating tablet Take 1 tablet (4 mg total) by mouth every 8 (eight) hours as needed for nausea or vomiting. Patient not taking: No sig reported 02/26/20   Evalee Jefferson, PA-C    Allergies    Darvocet [propoxyphene n-acetaminophen], Hydrocodone-acetaminophen, Lactose intolerance (gi), Other, Penicillins, Latex, Lortab [hydrocodone-acetaminophen], and Percocet [oxycodone-acetaminophen]  Review of Systems   Review of Systems  Constitutional: Negative for chills and fever.  HENT: Negative for rhinorrhea and sore throat.   Eyes: Negative for visual disturbance.  Respiratory: Negative for cough and shortness of breath.   Cardiovascular: Positive for leg swelling. Negative for chest pain.  Gastrointestinal: Positive for abdominal pain and nausea. Negative for diarrhea and vomiting.  Genitourinary: Negative for dysuria.  Musculoskeletal: Negative for back pain and neck pain.  Skin: Negative for rash.  Neurological: Negative for dizziness, light-headedness and headaches.  Hematological: Does not bruise/bleed easily.  Psychiatric/Behavioral: Negative for confusion.    Physical Exam Updated Vital Signs BP 123/65 (BP Location: Left Arm)   Pulse 67   Temp 98.6 F (37 C) (Oral)   Resp 17   Ht 1.626 m ('5\' 4"' )   Wt 107.5 kg   LMP 04/13/2020   SpO2 100%   BMI 40.68 kg/m   Physical Exam Vitals and nursing note reviewed.  Constitutional:      General: She is not in acute distress.    Appearance: Normal appearance. She is well-developed.  HENT:     Head: Normocephalic and atraumatic.  Eyes:     Extraocular Movements: Extraocular movements intact.     Conjunctiva/sclera: Conjunctivae normal.     Pupils: Pupils are equal, round, and reactive to light.  Cardiovascular:      Rate and Rhythm: Normal rate and regular rhythm.     Heart sounds: No murmur heard.   Pulmonary:     Effort: Pulmonary effort is normal. No respiratory distress.     Breath sounds: Normal breath sounds.  Abdominal:     General: There is no distension.     Palpations: Abdomen is soft.     Tenderness: There is no abdominal tenderness.  Musculoskeletal:     Cervical back: Neck supple.  Right lower leg: Edema present.     Left lower leg: Edema present.     Comments: Bilateral leg swelling.  So some puffiness to her hands.  Skin:    General: Skin is warm and dry.     Capillary Refill: Capillary refill takes less than 2 seconds.  Neurological:     General: No focal deficit present.     Mental Status: She is alert and oriented to person, place, and time.     Sensory: No sensory deficit.     ED Results / Procedures / Treatments   Labs (all labs ordered are listed, but only abnormal results are displayed) Labs Reviewed - No data to display  EKG None  Radiology No results found.  Procedures Procedures   Medications Ordered in ED Medications - No data to display  ED Course  I have reviewed the triage vital signs and the nursing notes.  Pertinent labs & imaging results that were available during my care of the patient were reviewed by me and considered in my medical decision making (see chart for details).    MDM Rules/Calculators/A&P                          His main concern was whether she was pregnant.  So was planning on labs pregnancy test.  Pregnancy test negative was going to CT scan of the abdomen and probably include her chest to take a look at this ninth rib lesion.  Patient states that she cannot stay beyond 2:00.  Yes to pick up her child at school.  Basic labs are back.  The pregnancy test is not back.  However urine was collected.  Complete metabolic panel was significant for an alk phos that was elevated at 127 but otherwise no abnormalities.  Lipase  was normal.  No leukocytosis hemoglobin was 11.  Albumin was normal.  And kidney function was normal  Patient is going to leave.  She states she will come back this evening.  Advised patient that we do not have enough information to give her a specific diagnosis at this time. Final Clinical Impression(s) / ED Diagnoses Final diagnoses:  None    Rx / DC Orders ED Discharge Orders    None       Fredia Sorrow, MD 05/02/20 1410

## 2020-05-02 NOTE — ED Triage Notes (Signed)
Pt presents to ED with complaints of pelvic pressure, swelling in hands and legs, and rash on her face all x 2 weeks.

## 2020-05-24 ENCOUNTER — Encounter: Payer: Medicaid Other | Admitting: Neurology

## 2020-06-30 ENCOUNTER — Other Ambulatory Visit: Payer: Self-pay | Admitting: Gerontology

## 2020-06-30 ENCOUNTER — Other Ambulatory Visit (HOSPITAL_COMMUNITY): Payer: Self-pay | Admitting: Gerontology

## 2020-06-30 ENCOUNTER — Ambulatory Visit (HOSPITAL_COMMUNITY)
Admission: RE | Admit: 2020-06-30 | Discharge: 2020-06-30 | Disposition: A | Discharge status: Home / Self Care | Payer: Medicaid Other | Source: Ambulatory Visit | Attending: Gerontology | Admitting: Gerontology

## 2020-06-30 DIAGNOSIS — R109 Unspecified abdominal pain: Secondary | ICD-10-CM

## 2020-07-13 ENCOUNTER — Encounter: Payer: Medicaid Other | Admitting: Neurology

## 2020-07-20 ENCOUNTER — Other Ambulatory Visit: Payer: Self-pay | Admitting: Adult Health

## 2020-07-27 ENCOUNTER — Other Ambulatory Visit: Payer: Self-pay

## 2020-07-27 ENCOUNTER — Encounter: Payer: Self-pay | Admitting: Adult Health

## 2020-07-27 ENCOUNTER — Ambulatory Visit (INDEPENDENT_AMBULATORY_CARE_PROVIDER_SITE_OTHER): Payer: Medicaid Other | Admitting: Adult Health

## 2020-07-27 VITALS — BP 124/85 | HR 75 | Ht 64.0 in | Wt 240.0 lb

## 2020-07-27 DIAGNOSIS — Z1211 Encounter for screening for malignant neoplasm of colon: Secondary | ICD-10-CM | POA: Diagnosis not present

## 2020-07-27 DIAGNOSIS — R208 Other disturbances of skin sensation: Secondary | ICD-10-CM

## 2020-07-27 DIAGNOSIS — Z131 Encounter for screening for diabetes mellitus: Secondary | ICD-10-CM

## 2020-07-27 DIAGNOSIS — Z01419 Encounter for gynecological examination (general) (routine) without abnormal findings: Secondary | ICD-10-CM | POA: Insufficient documentation

## 2020-07-27 LAB — HEMOCCULT GUIAC POC 1CARD (OFFICE): Fecal Occult Blood, POC: NEGATIVE

## 2020-07-27 NOTE — Progress Notes (Signed)
Patient ID: Breanna Malone, female   DOB: 1980-09-24, 40 y.o.   MRN: [id number] History of Present Illness: Breanna Malone is a 40 year old black female,widowed, [id number] in for well woman gyn exam.  She has not meds in almost a week, she will call pharmacy back about them. She requests labs.  Lab Results  Component Value Date   DIAGPAP  11/27/2018    - Negative for intraepithelial lesion or malignancy (NILM)   [place] Negative 11/27/2018   PCP is Dr Legrand Rams.   Current Medications, Allergies, Past Medical History, Past Surgical History, Family History and Social History were reviewed in [employer] record.     Review of Systems: Patient denies any headaches, hearing loss, fatigue, blurred vision, shortness of breath, chest pain, abdominal pain, problems with bowel movements, urination, or intercourse. No joint pain or mood swings. She says feels like legs and hands swell, and has burning right outer calf She says nipples are sore    Physical Exam:BP 124/85 (BP Location: Right Arm, Patient Position: Sitting, Cuff Size: Normal)   Pulse 75   Ht 5\' 4"  (1.626 m)   Wt 240 lb (108.9 kg)   LMP 07/02/2020 (Exact Date)   BMI 41.20 kg/m  General:  Well developed, well nourished, no acute distress Skin:  Warm and dry Neck:  Midline trachea, normal thyroid, good ROM, no lymphadenopathy Lungs; Clear to auscultation bilaterally Breast:  No dominant palpable mass, retraction, or nipple discharge Cardiovascular: Regular rate and rhythm Abdomen:  Soft, non tender, no hepatosplenomegaly Pelvic:  External genitalia is normal in appearance, no lesions.  The vagina is normal in appearance. Urethra has no lesions or masses. The cervix is bulbous.  Uterus is felt to be normal size, shape, and contour.  No adnexal masses or tenderness noted.Bladder is non tender, no masses felt. Rectal: Good sphincter tone, no polyps, or hemorrhoids felt.  Hemoccult negative. Extremities/musculoskeletal:  No  swelling or varicosities noted, no clubbing or cyanosis Psych:  No mood changes, alert and cooperative,seems happy AA is 1 Fall risk is low Depression screen [facility] 2/9 07/27/2020 04/30/2019 11/27/2018  Decreased Interest 0 0 0  Down, Depressed, Hopeless 1 2 1   PHQ - 2 Score 1 2 1   Altered sleeping 0 1 -  Tired, decreased energy 0 0 -  Change in appetite 0 0 -  Feeling bad or failure about yourself  0 1 -  Trouble concentrating 0 0 -  Moving slowly or fidgety/restless 0 0 -  Suicidal thoughts 0 0 -  PHQ-9 Score 1 4 -  Difficult doing work/chores - Not difficult at all -  Some recent data might be hidden   GAD 7 : Generalized Anxiety Score 07/27/2020  Nervous, Anxious, on Edge 0  Control/stop worrying 0  Worry too much - different things 1  Trouble relaxing 0  Restless 1  Easily annoyed or irritable 0  Afraid - awful might happen 0  Total GAD 7 Score 2    [facility] - 07/27/20 1448      Pregnancy Intention Screening   Does the patient want to become pregnant in the next year? Unsure    Does the patient's partner want to become pregnant in the next year? Unsure    Would the patient like to discuss contraceptive options today? No      Contraception Wrap Up   Current Method Female Condom    End Method Female Condom    Contraception Counseling Provided No  Examination chaperoned by Celene Squibb LPN  Impression and Plan: 1. Encounter for well woman exam with routine gynecological exam Pap and physical in 1 year Mammogram yearly Colonoscopy at 36  Check CBC,CMP,TSH and lipids  2. Encounter for screening fecal occult blood testing  3. Screening for diabetes mellitus Check A1c

## 2020-07-28 LAB — CBC
Hematocrit: 37.2 % (ref 34.0–46.6)
Hemoglobin: 12.6 g/dL (ref 11.1–15.9)
MCH: 31 pg (ref 26.6–33.0)
MCHC: 33.9 g/dL (ref 31.5–35.7)
MCV: 92 fL (ref 79–97)
Platelets: 318 10*3/uL (ref 150–450)
RBC: 4.06 x10E6/uL (ref 3.77–5.28)
RDW: 12.1 % (ref 11.7–15.4)
WBC: 9.9 10*3/uL (ref 3.4–10.8)

## 2020-07-28 LAB — TSH: TSH: 0.669 u[IU]/mL (ref 0.450–4.500)

## 2020-07-28 LAB — COMPREHENSIVE METABOLIC PANEL
ALT: 38 IU/L — ABNORMAL HIGH (ref 0–32)
AST: 41 IU/L — ABNORMAL HIGH (ref 0–40)
Albumin/Globulin Ratio: 1.8 (ref 1.2–2.2)
Albumin: 4.6 g/dL (ref 3.8–4.8)
Alkaline Phosphatase: 152 IU/L — ABNORMAL HIGH (ref 44–121)
BUN/Creatinine Ratio: 8 — ABNORMAL LOW (ref 9–23)
BUN: 7 mg/dL (ref 6–24)
Bilirubin Total: 0.6 mg/dL (ref 0.0–1.2)
CO2: 22 mmol/L (ref 20–29)
Calcium: 9.4 mg/dL (ref 8.7–10.2)
Chloride: 102 mmol/L (ref 96–106)
Creatinine, Ser: 0.85 mg/dL (ref 0.57–1.00)
Globulin, Total: 2.5 g/dL (ref 1.5–4.5)
Glucose: 83 mg/dL (ref 65–99)
Potassium: 3.9 mmol/L (ref 3.5–5.2)
Sodium: 139 mmol/L (ref 134–144)
Total Protein: 7.1 g/dL (ref 6.0–8.5)
eGFR: 88 mL/min/{1.73_m2} (ref >59)

## 2020-07-28 LAB — HEMOGLOBIN A1C
Est. average glucose Bld gHb Est-mCnc: 134 mg/dL
Hgb A1c MFr Bld: 6.3 % — ABNORMAL HIGH (ref 4.8–5.6)

## 2020-07-28 LAB — LIPID PANEL
Chol/HDL Ratio: 5 ratio — ABNORMAL HIGH (ref 0.0–4.4)
Cholesterol, Total: 213 mg/dL — ABNORMAL HIGH (ref 100–199)
HDL: 43 mg/dL (ref >39)
LDL Chol Calc (NIH): 141 mg/dL — ABNORMAL HIGH (ref 0–99)
Triglycerides: 159 mg/dL — ABNORMAL HIGH (ref 0–149)
VLDL Cholesterol Cal: 29 mg/dL (ref 5–40)

## 2020-08-02 ENCOUNTER — Telehealth: Payer: Self-pay | Admitting: Adult Health

## 2020-08-02 NOTE — Telephone Encounter (Signed)
Breanna Malone is aware of labs,A1c 6.3 and TC 219,LDL,141 and AST 41 and ALT 38 alk phos 152, decrease tylenol and get copy of labs for Dr Legrand Rams for next visit with him

## 2020-09-19 ENCOUNTER — Telehealth: Payer: Self-pay | Admitting: Adult Health

## 2020-09-19 NOTE — Telephone Encounter (Signed)
Last night wiped, it was pink Today it's brown - cycle due to start around the 5th of the month  Please advise - does pt need to be seen or is this just her cycle starting

## 2020-09-19 NOTE — Telephone Encounter (Signed)
Returned pt's call. Transferred pt's call to front to make an appt for a nurse visit self-swab.

## 2020-10-11 ENCOUNTER — Other Ambulatory Visit: Payer: Self-pay | Admitting: Orthopedic Surgery

## 2020-10-28 ENCOUNTER — Encounter: Payer: Self-pay | Admitting: Adult Health

## 2020-10-28 ENCOUNTER — Ambulatory Visit (INDEPENDENT_AMBULATORY_CARE_PROVIDER_SITE_OTHER): Payer: Medicaid Other | Admitting: Adult Health

## 2020-10-28 ENCOUNTER — Other Ambulatory Visit (HOSPITAL_COMMUNITY)
Admission: RE | Admit: 2020-10-28 | Discharge: 2020-10-28 | Disposition: A | Discharge status: Home / Self Care | Payer: Medicaid Other | Source: Ambulatory Visit | Attending: Adult Health | Admitting: Adult Health

## 2020-10-28 ENCOUNTER — Other Ambulatory Visit: Payer: Self-pay

## 2020-10-28 VITALS — BP 118/68 | HR 70 | Ht 64.0 in | Wt 236.0 lb

## 2020-10-28 DIAGNOSIS — R102 Pelvic and perineal pain: Secondary | ICD-10-CM | POA: Insufficient documentation

## 2020-10-28 DIAGNOSIS — N644 Mastodynia: Secondary | ICD-10-CM | POA: Insufficient documentation

## 2020-10-28 MED ORDER — VITAMIN B-6 25 MG PO TABS: ORAL_TABLET | ORAL | 99 refills | Status: DC

## 2020-10-28 NOTE — Progress Notes (Signed)
Patient ID: Breanna Malone, female   DOB: Dec 12, 1980, 40 y.o.   MRN: [id number] History of Present Illness: Breanna Malone is a 40 year old black female, widowed, [id number] in complaining of breast pain for a week and low pelvic pain/pressure. PCP is Dr Legrand Rams  Lab Results  Component Value Date   DIAGPAP  11/27/2018    - Negative for intraepithelial lesion or malignancy (NILM)   [place] Negative 11/27/2018    Current Medications, Allergies, Past Medical History, Past Surgical History, Family History and Social History were reviewed in [employer] record.     Review of Systems: +breast pain +pelvic pain Denies any pain with sex     Physical Exam:BP 118/68 (BP Location: Right Arm, Patient Position: Sitting, Cuff Size: Normal)   Pulse 70   Ht '5\' 4"'$  (1.626 m)   Wt 236 lb (107 kg)   LMP 10/13/2020   BMI 40.51 kg/m   General:  Well developed, well nourished, no acute distress Skin:  Warm and dry Lungs; Clear to auscultation bilaterally Breast:  No dominant palpable mass, retraction, or nipple discharge,nipples tender Cardiovascular: Regular rate and rhythm Pelvic:  External genitalia is normal in appearance, no lesions.  The vagina is normal in appearance,has white discharge with odor, CV swab obtained.Marland Kitchen Urethra has no lesions or masses. The cervix is bulbous.  Uterus is felt to be normal size, shape, and contour,mildly tender.  No adnexal masses or tenderness noted.Bladder is non tender, no masses felt. Extremities/musculoskeletal:  No swelling or varicosities noted, no clubbing or cyanosis Psych:  No mood changes, alert and cooperative,seems happy  [facility] - 10/28/20 1209       Pregnancy Intention Screening   Does the patient want to become pregnant in the next year? No    Does the patient's partner want to become pregnant in the next year? No    Would the patient like to discuss contraceptive options today? No      Contraception Wrap Up   Current Method Female  Condom    End Method Female Condom    Contraception Counseling Provided No              Examination chaperoned by Levy Pupa  LPN   Impression and Plan 1. Breast pain Try B6 Meds ordered this encounter  Medications   vitamin B-6 (PYRIDOXINE) 25 MG tablet    Sig: Take 1 tid    Dispense:  90 tablet    Refill:  PRN    Order Specific Question:   Supervising Provider    Answer:   Tania Ade H [[id number]]     2. Pelvic pain Will get [place] to assess uterus and ovaries in about 4 weeks  CV swab sent for GC/CHL,trich and BV and yeast  - US PELVIC COMPLETE WITH TRANSVAGINAL; Future - Cervicovaginal ancillary only( [place])

## 2020-10-30 ENCOUNTER — Emergency Department (HOSPITAL_COMMUNITY)
Admission: EM | Admit: 2020-10-30 | Discharge: 2020-10-30 | Disposition: A | Discharge status: Home / Self Care | Payer: Medicaid Other | Attending: Emergency Medicine | Admitting: Emergency Medicine

## 2020-10-30 ENCOUNTER — Encounter (HOSPITAL_COMMUNITY): Payer: Self-pay | Admitting: Emergency Medicine

## 2020-10-30 ENCOUNTER — Other Ambulatory Visit: Payer: Self-pay

## 2020-10-30 DIAGNOSIS — R102 Pelvic and perineal pain: Secondary | ICD-10-CM | POA: Diagnosis present

## 2020-10-30 DIAGNOSIS — M545 Low back pain, unspecified: Secondary | ICD-10-CM | POA: Insufficient documentation

## 2020-10-30 DIAGNOSIS — Z7984 Long term (current) use of oral hypoglycemic drugs: Secondary | ICD-10-CM | POA: Insufficient documentation

## 2020-10-30 DIAGNOSIS — Z91040 Latex allergy status: Secondary | ICD-10-CM | POA: Diagnosis not present

## 2020-10-30 DIAGNOSIS — I10 Essential (primary) hypertension: Secondary | ICD-10-CM | POA: Insufficient documentation

## 2020-10-30 DIAGNOSIS — Z79899 Other long term (current) drug therapy: Secondary | ICD-10-CM | POA: Diagnosis not present

## 2020-10-30 DIAGNOSIS — E119 Type 2 diabetes mellitus without complications: Secondary | ICD-10-CM | POA: Diagnosis not present

## 2020-10-30 DIAGNOSIS — N644 Mastodynia: Secondary | ICD-10-CM | POA: Diagnosis not present

## 2020-10-30 DIAGNOSIS — R109 Unspecified abdominal pain: Secondary | ICD-10-CM | POA: Insufficient documentation

## 2020-10-30 DIAGNOSIS — J45909 Unspecified asthma, uncomplicated: Secondary | ICD-10-CM | POA: Insufficient documentation

## 2020-10-30 LAB — URINALYSIS, ROUTINE W REFLEX MICROSCOPIC
Bilirubin Urine: NEGATIVE
Glucose, UA: NEGATIVE mg/dL
Hgb urine dipstick: NEGATIVE
Ketones, ur: NEGATIVE mg/dL
Nitrite: NEGATIVE
Protein, ur: NEGATIVE mg/dL
Specific Gravity, Urine: 1.02 (ref 1.005–1.030)
pH: 7 (ref 5.0–8.0)

## 2020-10-30 LAB — URINALYSIS, MICROSCOPIC (REFLEX)

## 2020-10-30 LAB — CBC WITH DIFFERENTIAL/PLATELET
Abs Immature Granulocytes: 0.03 10*3/uL (ref 0.00–0.07)
Basophils Absolute: 0 10*3/uL (ref 0.0–0.1)
Basophils Relative: 0 %
Eosinophils Absolute: 0.1 10*3/uL (ref 0.0–0.5)
Eosinophils Relative: 1 %
HCT: 41 % (ref 36.0–46.0)
Hemoglobin: 12.9 g/dL (ref 12.0–15.0)
Immature Granulocytes: 0 %
Lymphocytes Relative: 40 %
Lymphs Abs: 3.5 10*3/uL (ref 0.7–4.0)
MCH: 31 pg (ref 26.0–34.0)
MCHC: 31.5 g/dL (ref 30.0–36.0)
MCV: 98.6 fL (ref 80.0–100.0)
Monocytes Absolute: 0.6 10*3/uL (ref 0.1–1.0)
Monocytes Relative: 7 %
Neutro Abs: 4.5 10*3/uL (ref 1.7–7.7)
Neutrophils Relative %: 52 %
Platelets: 317 10*3/uL (ref 150–400)
RBC: 4.16 MIL/uL (ref 3.87–5.11)
RDW: 12 % (ref 11.5–15.5)
WBC: 8.8 10*3/uL (ref 4.0–10.5)
nRBC: 0 % (ref 0.0–0.2)

## 2020-10-30 LAB — COMPREHENSIVE METABOLIC PANEL
ALT: 44 U/L (ref 0–44)
AST: 37 U/L (ref 15–41)
Albumin: 4.2 g/dL (ref 3.5–5.0)
Alkaline Phosphatase: 118 U/L (ref 38–126)
Anion gap: 7 (ref 5–15)
BUN: 10 mg/dL (ref 6–20)
CO2: 25 mmol/L (ref 22–32)
Calcium: 9.1 mg/dL (ref 8.9–10.3)
Chloride: 104 mmol/L (ref 98–111)
Creatinine, Ser: 0.77 mg/dL (ref 0.44–1.00)
GFR, Estimated: >60 mL/min (ref >60)
Glucose, Bld: 105 mg/dL — ABNORMAL HIGH (ref 70–99)
Potassium: 3.8 mmol/L (ref 3.5–5.1)
Sodium: 136 mmol/L (ref 135–145)
Total Bilirubin: 0.5 mg/dL (ref 0.3–1.2)
Total Protein: 7.4 g/dL (ref 6.5–8.1)

## 2020-10-30 LAB — PREGNANCY, URINE: Preg Test, Ur: NEGATIVE

## 2020-10-30 NOTE — Discharge Instructions (Signed)
Follow-up with your doctor regarding your rib lesion. Follow-up with your gynecologist as scheduled. If your pregnancy test is negative today, you can take ibuprofen for your back pain.  This will also help with your abdominal pain and breast pain.

## 2020-10-30 NOTE — ED Provider Notes (Signed)
[facility] EMERGENCY DEPARTMENT Provider Note   CSN: [id number] Arrival date & time: 10/30/20  1537     History Chief Complaint  Patient presents with   Pelvic Pain    Breanna Malone is a 40 y.o. female.  40 year old female with history of diabetes, ovarian cyst, additional history as listed below presents with complaint of breast pain, low back pain, generalized abdominal pain for the past several weeks.  Patient went to her gynecologist as scheduled for an ultrasound to evaluate for cyst however is not scheduled for this until October 6 and states that she is in too much pain to wait until then.  Patient has not tried taking anything for her symptoms.  She denies changes in bowel or bladder habits, fevers, chills, nausea, vomiting.  Last menstrual cycle was August 25.  No other complaints or concerns.  She did have a milky white discharge on pelvic exam at her gynecologist which has been sent out, results unknown.      Past Medical History:  Diagnosis Date   Abnormal Pap smear    Arthritis    Asthma    Asthma    Bipolar 1 disorder ([place])    Bronchitis    BV (bacterial vaginosis) 10/08/2019   8/19 rx flagyl   Carpal tunnel syndrome    Cervical spine pain    Chest pain    Diabetes mellitus without complication (HCC)    Elevated cholesterol    High cholesterol    Hyperlipidemia    Hyperlipoproteinemia    Hypertension    Post traumatic stress disorder    Right ovarian cyst 10/31/2012   Complex right ovarian cyst seen 8/18 in ER need f/u US   Tendonitis    Vaginal Pap smear, abnormal     Patient Active Problem List   Diagnosis Date Noted   Pelvic pain 10/28/2020   Breast pain 10/28/2020   Encounter for well woman exam with routine gynecological exam 07/27/2020   Encounter for screening fecal occult blood testing 07/27/2020   Screening for diabetes mellitus 07/27/2020   BV (bacterial vaginosis) 10/08/2019   Body mass index 38.0-38.9, adult 04/30/2019   Weight loss  counseling, encounter for 04/30/2019   Body mass index 39.0-39.9, adult 11/27/2018   Carpal tunnel syndrome of right wrist    Condyloma acuminatum in female 02/24/2014   Abnormal Pap smear of cervix 11/24/2012   Asthma 11/24/2012   Bipolar 1 disorder ([place]) 10/31/2012    Past Surgical History:  Procedure Laterality Date   CARPAL TUNNEL RELEASE Right 06/28/2016   Procedure: RIGHT CARPAL TUNNEL RELEASE;  Surgeon: Carole Civil, MD;  Location: [facility];  Service: Orthopedics;  Laterality: Right;   HERNIA REPAIR     umbilical     OB History     Gravida  7   Para  6   Term  6   Preterm      AB  1   Living  4      SAB  1   IAB      Ectopic      Multiple      Live Births  67           Family History  Problem Relation Age of Onset   Diabetes Mother    Hypertension Mother    Asthma Father    Bronchitis Daughter    Asthma Daughter    Stroke Paternal Grandmother    Heart attack Paternal Grandmother    Cancer Paternal  Grandmother    Asthma Son    Bronchitis Son    Asthma Daughter    Bronchitis Daughter    Asthma Son    Asthma Other    Bronchitis Other    Cancer Cousin     Social History   Tobacco Use   Smoking status: Never   Smokeless tobacco: Never  Vaping Use   Vaping Use: Never used  Substance Use Topics   Alcohol use: No   Drug use: No    Home Medications Prior to Admission medications   Medication Sig Start Date End Date Taking? Authorizing Provider  acetaminophen (TYLENOL) 500 MG tablet Take 500 mg by mouth every 6 (six) hours as needed for mild pain.   Yes [provider]  hydrochlorothiazide (HYDRODIURIL) 12.5 MG tablet Take 6.25 mg by mouth daily.   Yes [provider]  lisinopril (PRINIVIL,ZESTRIL) 10 MG tablet Take 5 mg by mouth daily.   Yes [provider]  metFORMIN (GLUCOPHAGE) 500 MG tablet Take 500 mg by mouth 2 (two) times daily with a meal.   Yes [provider]  pravastatin (PRAVACHOL)  40 MG tablet Take 40 mg by mouth daily.   Yes [provider]  PROAIR HFA 108 (90 Base) MCG/ACT inhaler Inhale 1-2 puffs into the lungs every 6 (six) hours as needed for wheezing. 02/15/20  Yes [provider]    Allergies    Darvocet [propoxyphene n-acetaminophen], Hydrocodone-acetaminophen, Lactose intolerance (gi), Other, Penicillins, Latex, and Percocet [oxycodone-acetaminophen]  Review of Systems   Review of Systems  Constitutional:  Negative for fever.  Respiratory:  Negative for shortness of breath.   Cardiovascular:  Negative for chest pain.  Gastrointestinal:  Positive for abdominal pain. Negative for constipation, diarrhea, nausea and vomiting.  Genitourinary:  Negative for dysuria and frequency.  Musculoskeletal:  Positive for back pain. Negative for arthralgias and myalgias.  Skin:  Negative for rash and wound.  Allergic/Immunologic: Negative for immunocompromised state.  Neurological:  Negative for weakness.  Psychiatric/Behavioral:  Negative for confusion.   All other systems reviewed and are negative.  Physical Exam Updated Vital Signs BP (!) 102/56 (BP Location: Right Arm)   Pulse 84   Temp 98.7 F (37.1 C) (Oral)   Resp 18   Ht '5\' 4"'$  (1.626 m)   Wt 106.6 kg   LMP 10/13/2020   SpO2 99%   BMI 40.34 kg/m   Physical Exam Vitals and nursing note reviewed.  Constitutional:      General: She is not in acute distress.    Appearance: She is well-developed. She is obese. She is not diaphoretic.  HENT:     Head: Normocephalic and atraumatic.  Cardiovascular:     Rate and Rhythm: Normal rate and regular rhythm.     Heart sounds: Normal heart sounds.  Pulmonary:     Effort: Pulmonary effort is normal.     Breath sounds: Normal breath sounds.  Abdominal:     Palpations: Abdomen is soft.     Tenderness: There is no abdominal tenderness.  Musculoskeletal:        General: Tenderness present.       Back:  Skin:    General: Skin is warm and dry.      Findings: No erythema or rash.  Neurological:     Mental Status: She is alert and oriented to person, place, and time.  Psychiatric:        Behavior: Behavior normal.    ED Results / Procedures / Treatments  Labs (all labs ordered are listed, but only abnormal results are displayed) Labs Reviewed  COMPREHENSIVE METABOLIC PANEL - Abnormal; Notable for the following components:      Result Value   Glucose, Bld 105 (*)    All other components within normal limits  CBC WITH DIFFERENTIAL/PLATELET  PREGNANCY, URINE  URINALYSIS, ROUTINE W REFLEX MICROSCOPIC    EKG None  Radiology No results found.  Procedures Procedures   Medications Ordered in ED Medications - No data to display  ED Course  I have reviewed the triage vital signs and the nursing notes.  Pertinent labs & imaging results that were available during my care of the patient were reviewed by me and considered in my medical decision making (see chart for details).  Clinical Course as of 10/30/20 1901  Nancy Fetter Sep 11, 522  2349 40 year old female presents with complaint of breast soreness bilaterally, bilateral low back pain and abdominal discomfort.  Her symptoms have been ongoing for 2 to 3 weeks.  Patient went to her gynecologist 2 days ago, had a pelvic exam with GC chlamydia and wet prep testing sent out (not resulted as of yet), scheduled for ultrasound October 6.  Patient presents the ED today in hopes to complete her ultrasound.  Abdomen is soft and nontender, patient is in no acute distress.  She does have tenderness palpation bilateral paraspinous lumbar muscles, no midline or bony tenderness. [LM]  1820 Review of records, does have unusual expansile lesion in her left ninth rib.  I discussed this with patient who states that she was told it was a "small something and nothing to worry about."  Has not had recommended follow-up MRI although this was ordered. [LM]  1820 CBC and CMP unremarkable.  UA/upreg  pending. Patient just went to the bathroom and did not collect a sample. Discussed need to r/o pregnancy with her complaints of abdominal discomfort and breast tenderness.  Advised to follow up with her PCP regarding her rib lesion.  [LM]  1836 If pregnancy test is positive, may need further work-up today otherwise may follow-up with her gynecologist/PCP for further work-up. [LM]    Clinical Course User Index [LM] Roque Lias   MDM Rules/Calculators/A&P                           Final Clinical Impression(s) / ED Diagnoses Final diagnoses:  Breast pain  Bilateral low back pain without sciatica, unspecified chronicity  Abdominal discomfort    Rx / DC Orders ED Discharge Orders     None        Tacy Learn, PA-C 10/30/20 1901    Sherwood Gambler, MD 10/31/20 [phone]

## 2020-10-30 NOTE — ED Provider Notes (Signed)
  Initial impression-Patient was received at shift change from Suella Broad, [facility] she provided HPI, current work-up, likely disposition see her note for full detail.  In short patient with history of diabetes, ovarian cyst, presents to the emergency department with chief complaint of breast pain, back pain, generalized stomach pain going on for last several weeks.  She does not have associated fevers, chills, nausea, vomiting, urinary symptoms.  Work-up-Current work-up reveals CBC that is unremarkable, CMP that shows slight hyperglycemia 105, urine pregnancy is negative, UA shows moderate leukocytes, no red blood cells, few white blood cells, rare bacteria, squamous cells present.  Plan-Per previous provider follow-up on urine pregnancy and UA if negative patient can be discharged home, if positive follow-up with transvaginal signs rule out of a ectopic pregnancy.  Patient also made aware that she will need to follow-up about the left rib mass she will follow-up with her PCP.  Reassessment-patient is reassessed, states that she is having back pain she has no red flag symptoms, pain is noted within the musculature surrounding the left iliac crest she has full range of motion 5 of 5 strength in lower extremities neurovascularly intact.  Suspect muscular strain, recommend over-the-counter pain medications,  follow-up with her PCP for further evaluation.    It is also noted that patient has an abnormal UA she does not endorse any urinary symptoms there is squamous cells noted, I suspect this is more a contaminated UA will defer antibiotics and culture at this time.  Patient was also made aware of the left rib mass she will be fine with her PCP for further evaluation.    Vital signs have remained stable, no indication for hospital admission.  Patient discussed with attending and they agreed with assessment and plan.  Patient given at home care as well strict return precautions.  Patient verbalized that  they understood agreed to said plan.    Marcello Fennel, PA-C 10/30/20 2040    Sherwood Gambler, MD 10/31/20 [phone]

## 2020-10-30 NOTE — ED Triage Notes (Signed)
Pt c/o lower abdominal/pelvic, breast, & back pain. Pt states her OB/GYN has scheduled her an ultrasound to rule out "cysts." Pt reports it is scheduled for October, but she cannot wait until then due to the pain.

## 2020-11-01 ENCOUNTER — Other Ambulatory Visit: Payer: Self-pay | Admitting: Adult Health

## 2020-11-01 ENCOUNTER — Telehealth: Payer: Self-pay

## 2020-11-01 LAB — CERVICOVAGINAL ANCILLARY ONLY
Bacterial Vaginitis (gardnerella): POSITIVE — AB
Candida Glabrata: NEGATIVE
Candida Vaginitis: NEGATIVE
Chlamydia: NEGATIVE
Comment: NEGATIVE
Comment: NEGATIVE
Comment: NEGATIVE
Comment: NEGATIVE
Comment: NEGATIVE
Comment: NORMAL
Neisseria Gonorrhea: NEGATIVE
Trichomonas: NEGATIVE

## 2020-11-01 MED ORDER — METRONIDAZOLE 500 MG PO TABS: 500.0000 mg | ORAL_TABLET | Freq: Two times a day (BID) | ORAL | 0 refills | Status: DC

## 2020-11-01 NOTE — Telephone Encounter (Signed)
Called pt to relay test result and prescription per Derrek Monaco. Two identifiers used. Pt confirmed understanding.

## 2020-11-01 NOTE — Progress Notes (Signed)
+  BV on vaginal swab rx flagyl  

## 2020-11-01 NOTE — Telephone Encounter (Signed)
-----   Message from Estill Dooms, NP sent at 11/01/2020  1:44 PM EDT ----- Let Olivia Mackie know, +BV on vaginal swab and rx sent in for flagyl

## 2020-11-21 ENCOUNTER — Other Ambulatory Visit: Payer: Self-pay

## 2020-11-21 ENCOUNTER — Encounter (HOSPITAL_BASED_OUTPATIENT_CLINIC_OR_DEPARTMENT_OTHER): Payer: Self-pay | Admitting: Orthopedic Surgery

## 2020-11-22 ENCOUNTER — Ambulatory Visit (HOSPITAL_COMMUNITY): Payer: Medicaid Other

## 2020-11-24 ENCOUNTER — Other Ambulatory Visit: Payer: Medicaid Other

## 2020-11-27 ENCOUNTER — Encounter (HOSPITAL_COMMUNITY): Payer: Self-pay | Admitting: Anesthesiology

## 2020-11-28 ENCOUNTER — Ambulatory Visit (HOSPITAL_BASED_OUTPATIENT_CLINIC_OR_DEPARTMENT_OTHER): Admission: RE | Admit: 2020-11-28 | Payer: Medicaid Other | Source: Home / Self Care | Admitting: Orthopedic Surgery

## 2020-11-28 SURGERY — CARPAL TUNNEL RELEASE
Anesthesia: Choice | Laterality: Left

## 2020-11-28 NOTE — Anesthesia Preprocedure Evaluation (Deleted)
Anesthesia Evaluation  Patient identified by MRN, date of birth, ID band Patient awake    Reviewed: Allergy & Precautions, H&P , NPO status , Patient's Chart, lab work & pertinent test results  Airway Mallampati: II  TM Distance: >3 FB Neck ROM: full    Dental no notable dental hx. (+) Teeth Intact, Dental Advisory Given   Pulmonary asthma ,    Pulmonary exam normal        Cardiovascular hypertension, negative cardio ROS Normal cardiovascular exam     Neuro/Psych PSYCHIATRIC DISORDERS (PTSD) Anxiety Bipolar Disorder  Neuromuscular disease (caroal tunnel)    GI/Hepatic negative GI ROS, Neg liver ROS,   Endo/Other  negative endocrine ROSdiabetes, Oral Hypoglycemic AgentsMorbid obesity  Renal/GU negative Renal ROS     Musculoskeletal  (+) Arthritis , Osteoarthritis,    Abdominal   Peds  Hematology negative hematology ROS (+)   Anesthesia Other Findings   Reproductive/Obstetrics                             Anesthesia Physical  Anesthesia Plan  ASA: 2  Anesthesia Plan: Bier Block and MAC and Bier Block-LIDOCAINE ONLY   Post-op Pain Management:    Induction: Intravenous  PONV Risk Score and Plan: 2 and Ondansetron  Airway Management Planned: Simple Face Mask, Nasal Cannula and Natural Airway  Additional Equipment: None  Intra-op Plan:   Post-operative Plan:   Informed Consent: I have reviewed the patients History and Physical, chart, labs and discussed the procedure including the risks, benefits and alternatives for the proposed anesthesia with the patient or authorized representative who has indicated his/her understanding and acceptance.       Plan Discussed with: Anesthesiologist and CRNA  Anesthesia Plan Comments:         Anesthesia Quick Evaluation

## 2020-11-30 ENCOUNTER — Encounter: Payer: Self-pay | Admitting: Family Medicine

## 2020-12-27 ENCOUNTER — Ambulatory Visit (INDEPENDENT_AMBULATORY_CARE_PROVIDER_SITE_OTHER): Payer: Medicaid Other | Admitting: General Surgery

## 2020-12-27 ENCOUNTER — Encounter: Payer: Self-pay | Admitting: General Surgery

## 2020-12-27 ENCOUNTER — Other Ambulatory Visit: Payer: Self-pay | Admitting: Orthopedic Surgery

## 2020-12-27 ENCOUNTER — Other Ambulatory Visit: Payer: Self-pay

## 2020-12-27 VITALS — BP 142/86 | HR 87 | Temp 97.9°F | Resp 14 | Ht 64.0 in | Wt 235.0 lb

## 2020-12-27 DIAGNOSIS — D169 Benign neoplasm of bone and articular cartilage, unspecified: Secondary | ICD-10-CM

## 2020-12-27 DIAGNOSIS — R0782 Intercostal pain: Secondary | ICD-10-CM | POA: Diagnosis not present

## 2020-12-27 NOTE — Patient Instructions (Addendum)
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 You had imaging in 2011 that demonstrated this but it is grown and changed.  This could be a benign lesion (not cancer) or could be changing into cancer. We need to get an MRI to evaluate. We will refer you to Cardiothoracic surgery regarding the finding, and will hopefully have the MRI done before you see them. Some of these tumors can be monitored and others require removal of the rib.

## 2020-12-29 NOTE — Progress Notes (Signed)
[facility] History and Physical  Reason for Referral: SKIN LESION?  Referring Physician: Dr. Legrand Rams  Chief Complaint   New Patient (Initial Visit)     Breanna Malone is a 40 y.o. female.  HPI: Patient referred to use with the diagnosis of a skin lesion. On reviewing her chart she has a large mass on her left 9th rib that has been there for years but is growing. Radiology called this an enchondroma versus transformation into a chrondrosarcoma given the increase in size. The patient had her last CT with this finding 03/2020. She says that she recently went to see Dr. Legrand Rams for other reason and he referred her to [place].  She says she has left side and flank pain. She was aware of a mass on her rib but did not that they were concerned about it growing 03/2020.  She never got an MRI that was ordered 03/2020. She was seen in the ED again on 10/2020 and told the ED she knew about the rib lesion but that she was told it was something small and nothing to worry about.   The patient endorses no skin lesions for me to look at today.   Past Medical History:  Diagnosis Date   Abnormal Pap smear    Arthritis    Asthma    Asthma    Bipolar 1 disorder ([place])    Bronchitis    BV (bacterial vaginosis) 10/08/2019   8/19 rx flagyl   Carpal tunnel syndrome    Cervical spine pain    Chest pain    Diabetes mellitus without complication (HCC)    Elevated cholesterol    High cholesterol    Hyperlipidemia    Hyperlipoproteinemia    Hypertension    Post traumatic stress disorder    Right ovarian cyst 10/31/2012   Complex right ovarian cyst seen 8/18 in ER need f/u US   Tendonitis    Vaginal Pap smear, abnormal     Past Surgical History:  Procedure Laterality Date   CARPAL TUNNEL RELEASE Right 06/28/2016   Procedure: RIGHT CARPAL TUNNEL RELEASE;  Surgeon: Carole Civil, MD;  Location: [facility];  Service: Orthopedics;  Laterality: Right;   HERNIA REPAIR     umbilical    Family History   Problem Relation Age of Onset   Diabetes Mother    Hypertension Mother    Asthma Father    Bronchitis Daughter    Asthma Daughter    Stroke Paternal Grandmother    Heart attack Paternal Grandmother    Cancer Paternal Grandmother    Asthma Son    Bronchitis Son    Asthma Daughter    Bronchitis Daughter    Asthma Son    Asthma Other    Bronchitis Other    Cancer Cousin     Social History   Tobacco Use   Smoking status: Never   Smokeless tobacco: Never  Vaping Use   Vaping Use: Never used  Substance Use Topics   Alcohol use: No   Drug use: No    Medications: I have reviewed the patient's current medications. Allergies as of 12/27/2020       Reactions   Darvocet [propoxyphene N-acetaminophen] Itching   Hydrocodone-acetaminophen Itching   Lactose Intolerance (gi) Other (See Comments)   Causes stomach cramping.   Other Hives   Patient is allergic to corndogs.   Penicillins Hives, Itching   Has patient had a PCN reaction causing immediate rash, facial/tongue/throat swelling, SOB or  lightheadedness with hypotension:Yes Has patient had a PCN reaction causing severe rash involving mucus membranes or skin necrosis:No Has patient had a PCN reaction that required hospitalization:No Has patient had a PCN reaction occurring within the last 10 years:No If all of the above answers are "NO", then may proceed with Cephalosporin use.   Latex Itching, Rash   Percocet [oxycodone-acetaminophen] Itching, Rash        Medication List        Accurate as of December 27, 2020 11:59 PM. If you have any questions, ask your nurse or doctor.          STOP taking these medications    metroNIDAZOLE 500 MG tablet Commonly known as: FLAGYL Stopped by: Virl Cagey, MD       TAKE these medications    acetaminophen 500 MG tablet Commonly known as: TYLENOL Take 500 mg by mouth every 6 (six) hours as needed for mild pain.   hydrochlorothiazide 12.5 MG tablet Commonly known  as: HYDRODIURIL Take 6.25 mg by mouth daily.   lisinopril 10 MG tablet Commonly known as: ZESTRIL Take 5 mg by mouth daily.   metFORMIN 500 MG tablet Commonly known as: GLUCOPHAGE Take 500 mg by mouth 2 (two) times daily with a meal.   pravastatin 40 MG tablet Commonly known as: PRAVACHOL Take 40 mg by mouth daily.   ProAir HFA 108 (90 Base) MCG/ACT inhaler Generic drug: albuterol Inhale 1-2 puffs into the lungs every 6 (six) hours as needed for wheezing.         ROS:  A comprehensive review of systems was negative except for: Musculoskeletal: positive for left flank and back pain  Blood pressure (!) 142/86, pulse 87, temperature 97.9 F (36.6 C), temperature source Other (Comment), resp. rate 14, height 5\' 4"  (1.626 m), weight 235 lb (106.6 kg), SpO2 97 %. Physical Exam Vitals reviewed.  HENT:     Head: Normocephalic.     Mouth/Throat:     Mouth: Mucous membranes are moist.  Cardiovascular:     Rate and Rhythm: Normal rate and regular rhythm.  Pulmonary:     Effort: Pulmonary effort is normal.     Breath sounds: Normal breath sounds.  Chest:     Comments: Obvious left sided lower rib cage asymmetry, some tenderness on left flank and lower rib cage area  Abdominal:     General: There is no distension.     Palpations: Abdomen is soft.     Tenderness: There is no abdominal tenderness.  Musculoskeletal:     Cervical back: Normal range of motion.  Neurological:     Mental Status: She is alert.    Results: Personally reviewed imaging, large ninth left rib lesion   CLINICAL DATA:  Flank pain, history of urolithiasis   EXAM: CT ABDOMEN AND PELVIS WITHOUT CONTRAST   TECHNIQUE: Multidetector CT imaging of the abdomen and pelvis was performed following the standard protocol without IV contrast.   COMPARISON:  CT chest 04/23/2009, additional CT angio chest 08/21/2015, 12/19/2009 with report only, images unavailable.   FINDINGS: Lower chest: Lung bases are  clear. Normal heart size. No pericardial effusion.   Hepatobiliary: No visible focal liver lesion. Normal liver attenuation. Smooth liver surface contour. Normal gallbladder and biliary tree. No visible calcified gallstones.   Pancreas: No pancreatic ductal dilatation or surrounding inflammatory changes.   Spleen: Normal in size. No concerning splenic lesions.   Adrenals/Urinary Tract: Normal adrenal glands. Kidneys are symmetric in size and normally located. Few  punctate nonobstructing calculi are present in the upper and lower pole left kidney. No obstructive urolithiasis or hydronephrosis. No visible or contour deforming renal lesions. Urinary bladder is largely decompressed at the time of exam and therefore poorly evaluated by CT imaging. No gross bladder abnormality accounting for the degree of distention.   Stomach/Bowel: Distal esophagus, stomach and duodenal sweep are unremarkable. No small bowel wall thickening or dilatation. No evidence of obstruction. A normal appendix is visualized. No colonic dilatation or wall thickening.   Vascular/Lymphatic: No significant vascular findings are present. No enlarged abdominal or pelvic lymph nodes.   Reproductive: Anteverted and slightly retroflexed uterus. No concerning adnexal lesions.   Other: No abdominopelvic free fluid or free gas. No bowel containing hernias. Mild ventral rectus diastasis.   Musculoskeletal: Expansile left ninth rib lesion with some internal heterogeneous ground-glass matrix has been present on comparison imaging as remotely as 2011 but appears to have increased in size from this most remote comparison now measuring up to 6 cm in maximal thickness, previously 4.8 cm. No other worrisome or conspicuous osseous lesions.   IMPRESSION: 1. Expansile left ninth rib lesion with some internal heterogeneous ground-glass matrix, present on comparison imaging as remotely as 2011 but appears to have increased in  size from this most remote comparison now measuring up to 6 cm in maximal thickness, previously 4.8 cm. Appearance is indeterminate though previously characterized as a probable enchondroma. Given some increase in size in malignant transformation into a chondrosarcoma cannot be fully excluded, particularly if the patient's pain is ipsilateral. Consider further evaluation with MR imaging which may better differentiate. 2. No other acute intra-abdominal process. Specifically, no evidence of obstructive urolithiasis or hydronephrosis. 3. Few punctate nonobstructing calculi in the left kidney.   Electronically Signed: By: Lovena Le M.D. On: 03/24/2020 04:30   Assessment & Plan:  MILAH RECHT is a 40 y.o. female with a left ninth rib lesion of unknown significant, it has been growing since prior imaging in 2011. Reports indicate it is possible a enchondroma but could be transformation due to the growth. She never received the MRI that had been ordered previously. I discussed this with the patient and that I do not take care of these types of lesions but that I do think she needs the MRI to look further at this and likely a discussion with Cardiothoracic surgery given her pain symptoms.    -MRI ordered of the chest for enchondroma versus chondrosarcoma  -Will refer to Cardiothoracic surgery to have them evaluate patient, even if it is benign she is having some pain in that area  -Follow up PRN  All questions were answered to the satisfaction of the patient.    Virl Cagey 12/29/2020, 10:26 AM

## 2021-01-13 ENCOUNTER — Ambulatory Visit (HOSPITAL_COMMUNITY)
Admission: RE | Admit: 2021-01-13 | Discharge: 2021-01-13 | Disposition: A | Discharge status: Home / Self Care | Payer: Medicaid Other | Source: Ambulatory Visit | Attending: General Surgery | Admitting: General Surgery

## 2021-01-13 ENCOUNTER — Other Ambulatory Visit: Payer: Self-pay

## 2021-01-13 DIAGNOSIS — D169 Benign neoplasm of bone and articular cartilage, unspecified: Secondary | ICD-10-CM | POA: Diagnosis present

## 2021-01-13 DIAGNOSIS — R0782 Intercostal pain: Secondary | ICD-10-CM | POA: Diagnosis present

## 2021-01-13 MED ORDER — GADOBUTROL 1 MMOL/ML IV SOLN
10.0000 mL | Freq: Once | INTRAVENOUS | Status: AC | PRN
  Administered 2021-01-13: 10 mL via INTRAVENOUS

## 2021-01-16 ENCOUNTER — Other Ambulatory Visit: Payer: Self-pay | Admitting: *Deleted

## 2021-01-16 DIAGNOSIS — D169 Benign neoplasm of bone and articular cartilage, unspecified: Secondary | ICD-10-CM

## 2021-01-16 NOTE — Progress Notes (Signed)
Patient with likely benign finding but has changed and needs to be evaluated by Cardiothoracic surgery. Lets get her referred to see them if we have not already. Let the patient know.

## 2021-01-19 ENCOUNTER — Other Ambulatory Visit: Payer: Self-pay

## 2021-01-19 ENCOUNTER — Encounter (HOSPITAL_BASED_OUTPATIENT_CLINIC_OR_DEPARTMENT_OTHER): Payer: Self-pay | Admitting: Orthopedic Surgery

## 2021-01-24 ENCOUNTER — Other Ambulatory Visit (HOSPITAL_COMMUNITY): Payer: Medicaid Other

## 2021-01-26 NOTE — Progress Notes (Signed)
Sent message reminding pt to come in for lab work.

## 2021-01-27 ENCOUNTER — Ambulatory Visit (HOSPITAL_BASED_OUTPATIENT_CLINIC_OR_DEPARTMENT_OTHER): Admission: RE | Admit: 2021-01-27 | Payer: Medicaid Other | Source: Home / Self Care | Admitting: Orthopedic Surgery

## 2021-01-27 ENCOUNTER — Encounter (HOSPITAL_COMMUNITY): Payer: Self-pay | Admitting: Anesthesiology

## 2021-01-27 DIAGNOSIS — E119 Type 2 diabetes mellitus without complications: Secondary | ICD-10-CM

## 2021-01-27 HISTORY — DX: Enthesopathy, unspecified: M77.9

## 2021-01-27 SURGERY — CARPAL TUNNEL RELEASE
Anesthesia: Choice | Laterality: Left

## 2021-02-01 ENCOUNTER — Other Ambulatory Visit: Payer: Medicaid Other

## 2021-02-08 ENCOUNTER — Ambulatory Visit: Payer: Medicaid Other | Admitting: Adult Health

## 2021-02-16 ENCOUNTER — Encounter (HOSPITAL_COMMUNITY): Payer: Self-pay | Admitting: Emergency Medicine

## 2021-02-16 ENCOUNTER — Emergency Department (HOSPITAL_COMMUNITY): Payer: Medicaid Other

## 2021-02-16 ENCOUNTER — Emergency Department (HOSPITAL_COMMUNITY)
Admission: EM | Admit: 2021-02-16 | Discharge: 2021-02-16 | Disposition: A | Discharge status: Home / Self Care | Payer: Medicaid Other | Attending: Emergency Medicine | Admitting: Emergency Medicine

## 2021-02-16 ENCOUNTER — Other Ambulatory Visit: Payer: Self-pay

## 2021-02-16 DIAGNOSIS — Z79899 Other long term (current) drug therapy: Secondary | ICD-10-CM | POA: Insufficient documentation

## 2021-02-16 DIAGNOSIS — I10 Essential (primary) hypertension: Secondary | ICD-10-CM | POA: Insufficient documentation

## 2021-02-16 DIAGNOSIS — M79604 Pain in right leg: Secondary | ICD-10-CM | POA: Diagnosis not present

## 2021-02-16 DIAGNOSIS — J45909 Unspecified asthma, uncomplicated: Secondary | ICD-10-CM | POA: Diagnosis not present

## 2021-02-16 DIAGNOSIS — Z7984 Long term (current) use of oral hypoglycemic drugs: Secondary | ICD-10-CM | POA: Insufficient documentation

## 2021-02-16 DIAGNOSIS — Z91040 Latex allergy status: Secondary | ICD-10-CM | POA: Diagnosis not present

## 2021-02-16 DIAGNOSIS — R2241 Localized swelling, mass and lump, right lower limb: Secondary | ICD-10-CM | POA: Diagnosis present

## 2021-02-16 MED ORDER — NAPROXEN 250 MG PO TABS
500.0000 mg | ORAL_TABLET | Freq: Once | ORAL | Status: AC
  Administered 2021-02-16: 23:00:00 500 mg via ORAL
  Filled 2021-02-16: qty 2

## 2021-02-16 MED ORDER — NAPROXEN 500 MG PO TABS: 500.0000 mg | ORAL_TABLET | Freq: Two times a day (BID) | ORAL | 0 refills | Status: DC

## 2021-02-16 NOTE — ED Triage Notes (Signed)
Pt with c/o R lower leg swelling (calf area) and states that her leg "feels heavy" when she tries to pick it up. States she noticed this 1-2 days ago.

## 2021-02-16 NOTE — ED Provider Notes (Signed)
[facility] EMERGENCY DEPARTMENT Provider Note   CSN: [id number] Arrival date & time: 02/16/21  2141     History Chief Complaint  Patient presents with   R leg swelling    Breanna Malone is a 39 y.o. female.  HPI  This patient is a 40 year old female, she has a history of arthritis asthma bipolar disorder, she also has a history of hypertension and hyperlipidemia.  The patient currently takes medications including metformin pravastatin ProAir lisinopril hydrochlorothiazide.  She states that over the last 3 days she has had some gradual increase in pain and swelling of her right calf.  This involves from her ankle to her knee, she denies that there has been any specific injuries.  She denies having any history of cancer long trips travel or immobilization or surgery.  She reports that she does have a daughter who had a blood clot at the age of 25, she does not recall anybody else in the family having a blood clot.  The patient is never had one herself.  Past Medical History:  Diagnosis Date   Abnormal Pap smear    Arthritis    Asthma    Asthma    Bipolar 1 disorder ([place])    Bone spur    rib   Bronchitis    BV (bacterial vaginosis) 10/08/2019   8/19 rx flagyl   Carpal tunnel syndrome    Cervical spine pain    Chest pain    Diabetes mellitus without complication (HCC)    Elevated cholesterol    High cholesterol    Hyperlipidemia    Hyperlipoproteinemia    Hypertension    Post traumatic stress disorder    Right ovarian cyst 10/31/2012   Complex right ovarian cyst seen 8/18 in ER need f/u US   Tendonitis    Vaginal Pap smear, abnormal     Patient Active Problem List   Diagnosis Date Noted   Enchondroma of bone 12/27/2020   Pelvic pain 10/28/2020   Breast pain 10/28/2020   Encounter for well woman exam with routine gynecological exam 07/27/2020   Encounter for screening fecal occult blood testing 07/27/2020   Screening for diabetes mellitus 07/27/2020   BV  (bacterial vaginosis) 10/08/2019   Body mass index 38.0-38.9, adult 04/30/2019   Weight loss counseling, encounter for 04/30/2019   Body mass index 39.0-39.9, adult 11/27/2018   Carpal tunnel syndrome of right wrist    Condyloma acuminatum in female 02/24/2014   Abnormal Pap smear of cervix 11/24/2012   Asthma 11/24/2012   Bipolar 1 disorder ([place]) 10/31/2012    Past Surgical History:  Procedure Laterality Date   CARPAL TUNNEL RELEASE Right 06/28/2016   Procedure: RIGHT CARPAL TUNNEL RELEASE;  Surgeon: Carole Civil, MD;  Location: [facility];  Service: Orthopedics;  Laterality: Right;   HERNIA REPAIR     umbilical     OB History     Gravida  7   Para  6   Term  6   Preterm      AB  1   Living  4      SAB  1   IAB      Ectopic      Multiple      Live Births  42           Family History  Problem Relation Age of Onset   Diabetes Mother    Hypertension Mother    Asthma Father    Bronchitis Daughter  Asthma Daughter    Stroke Paternal Grandmother    Heart attack Paternal Grandmother    Cancer Paternal Grandmother    Asthma Son    Bronchitis Son    Asthma Daughter    Bronchitis Daughter    Asthma Son    Asthma Other    Bronchitis Other    Cancer Cousin     Social History   Tobacco Use   Smoking status: Never   Smokeless tobacco: Never  Vaping Use   Vaping Use: Never used  Substance Use Topics   Alcohol use: No   Drug use: No    Home Medications Prior to Admission medications   Medication Sig Start Date End Date Taking? Authorizing Provider  naproxen (NAPROSYN) 500 MG tablet Take 1 tablet (500 mg total) by mouth 2 (two) times daily with a meal. 02/16/21  Yes Noemi Chapel, MD  acetaminophen (TYLENOL) 500 MG tablet Take 500 mg by mouth every 6 (six) hours as needed for mild pain.    [provider]  hydrochlorothiazide (HYDRODIURIL) 12.5 MG tablet Take 6.25 mg by mouth daily.    [provider]  lisinopril  (PRINIVIL,ZESTRIL) 10 MG tablet Take 5 mg by mouth daily.    [provider]  lisinopril (ZESTRIL) 5 MG tablet Take 5 mg by mouth daily. 01/18/21   [provider]  metFORMIN (GLUCOPHAGE) 500 MG tablet Take 500 mg by mouth 2 (two) times daily with a meal.    [provider]  pravastatin (PRAVACHOL) 40 MG tablet Take 40 mg by mouth daily.    [provider]  PROAIR HFA 108 [phone] Base) MCG/ACT inhaler Inhale 1-2 puffs into the lungs every 6 (six) hours as needed for wheezing. 02/15/20   [provider]    Allergies    Darvocet [propoxyphene n-acetaminophen], Hydrocodone-acetaminophen, Lactose intolerance (gi), Other, Penicillins, Latex, and Percocet [oxycodone-acetaminophen]  Review of Systems   Review of Systems  All other systems reviewed and are negative.  Physical Exam Updated Vital Signs BP (!) 141/81 (BP Location: Right Arm)    Pulse 78    Temp 98.5 F (36.9 C) (Oral)    Resp 18    Ht 1.626 m (5\' 4" )    Wt 104.3 kg    LMP 02/12/2021    SpO2 99%    BMI 39.48 kg/m   Physical Exam Vitals and nursing note reviewed.  Constitutional:      General: She is not in acute distress.    Appearance: She is well-developed.  HENT:     Head: Normocephalic and atraumatic.     Mouth/Throat:     Pharynx: No oropharyngeal exudate.  Eyes:     General: No scleral icterus.       Right eye: No discharge.        Left eye: No discharge.     Conjunctiva/sclera: Conjunctivae normal.     Pupils: Pupils are equal, round, and reactive to light.  Neck:     Thyroid: No thyromegaly.     Vascular: No JVD.  Cardiovascular:     Rate and Rhythm: Normal rate and regular rhythm.     Heart sounds: Normal heart sounds. No murmur heard.   No friction rub. No gallop.  Pulmonary:     Effort: Pulmonary effort is normal. No respiratory distress.     Breath sounds: Normal breath sounds. No wheezing or rales.  Abdominal:     General: Bowel sounds are normal. There is no  distension.  Palpations: Abdomen is soft. There is no mass.     Tenderness: There is no abdominal tenderness.  Musculoskeletal:        General: Swelling and tenderness present. Normal range of motion.     Cervical back: Normal range of motion and neck supple.     Right lower leg: No edema.     Left lower leg: No edema.     Comments: Mild swelling and tenderness to the right lower extremity in the calf area, the compartments are very soft, there is able to move the knee and the ankle passively but with active range of motion there is tenderness.  She does have reproducible tenderness to the entire right lower extremity between the knee and the ankle however the compartments are very soft and there is no redness of the skin.  Lymphadenopathy:     Cervical: No cervical adenopathy.  Skin:    General: Skin is warm and dry.     Findings: No erythema or rash.  Neurological:     Mental Status: She is alert.     Coordination: Coordination normal.  Psychiatric:        Behavior: Behavior normal.    ED Results / Procedures / Treatments   Labs (all labs ordered are listed, but only abnormal results are displayed) Labs Reviewed - No data to display  EKG None  Radiology No results found.  Procedures Procedures   Medications Ordered in ED Medications  naproxen (NAPROSYN) tablet 500 mg (has no administration in time range)    ED Course  I have reviewed the triage vital signs and the nursing notes.  Pertinent labs & imaging results that were available during my care of the patient were reviewed by me and considered in my medical decision making (see chart for details).    MDM Rules/Calculators/A&P                          This patient is already gone to the urgent care a couple of times, she states that she did not have any work-up done at the urgent care, she was redirected here.  Certainly I would entertain DVT is a possibility however the patient's, the time where we do not have  ultrasound capabilities.  I will arrange this for the morning, she will have an x-ray of the tib-fib area to make sure there is no bony abnormalities.  I think an anti-inflammatory is just fine for pain control given her well appearance, there is no signs of cellulitis, there is no signs of significant abnormalities otherwise.  She does not have any chest pain or shortness of breath  I have personally viewed and interpreted the x-ray of the patient's tibia and fibula of the right lower extremity.  There is no signs of bony abnormalities, no fracture, no obvious tumors  I agree with radiology interpretation.  The patient's vital signs have remained unremarkable, she will come back tomorrow for an ultrasound, she is otherwise well-appearing.  Patient agreeable to the plan     Final Clinical Impression(s) / ED Diagnoses Final diagnoses:  Lower extremity pain, right    Rx / DC Orders ED Discharge Orders          Ordered    US Venous Img Lower Unilateral Right        02/16/21 2259    naproxen (NAPROSYN) 500 MG tablet  2 times daily with meals        02/16/21  [place], Janetta Vandoren, MD 02/16/21 2300

## 2021-02-16 NOTE — Discharge Instructions (Signed)
Please call the phone number attached to make arrangements for your ultrasound in the morning.  If you do not have a blood clot, you will need to follow-up with your family doctor for further evaluation.  I have prescribed an anti-inflammatory pain medication to help with some of the pain.  Emergency department for severe or worsening symptoms.  If you would like to see a different family doctor please see the list below.  [facility] Primary Care Doctor List    Sinda Du MD. Specialty: Pulmonary Disease Contact information: [place]  [place] [address]  [phone]   Tula Nakayama, MD. Specialty: [facility] Medicine Contact information: [address], [address] [place] [address]  [phone]   Sallee Lange, MD. Specialty: [facility] Medicine Contact information: [place]  [place] [address]  [phone]   Rosita Fire, MD Specialty: Internal Medicine Contact information: [place] [address]  [phone]   Delphina Cahill, MD. Specialty: Internal Medicine Contact information: [place] [address]  [phone]    [facility] (Dr. Maudie Mercury) Specialty: Family Medicine Contact information: [place] [address]  [phone]   Leslie Andrea, MD. Specialty: [facility] Medicine Contact information: [place] [address]  [phone]   Asencion Noble, MD. Specialty: Internal Medicine Contact information: [address]  [place] [address]  [place]  [address] [place], [place] [address] [phone]  Services [facility] offers a variety of basic health services.  Services include but are not limited to: Blood pressure checks  Heart rate checks  Blood sugar checks  Urine analysis  Rapid strep tests  Pregnancy tests.   Health education and referrals  People needing more complex services will be directed to a physician online. Using these virtual visits, doctors can evaluate and prescribe medicine and treatments. There will be no medication on-site, though [facility] will help patients fill their prescriptions at little to no cost.   For More information please go to: [email or web address]

## 2021-02-21 ENCOUNTER — Other Ambulatory Visit: Payer: Self-pay

## 2021-02-21 ENCOUNTER — Institutional Professional Consult (permissible substitution) (INDEPENDENT_AMBULATORY_CARE_PROVIDER_SITE_OTHER): Payer: Medicaid Other | Admitting: Thoracic Surgery (Cardiothoracic Vascular Surgery)

## 2021-02-21 ENCOUNTER — Encounter: Payer: Self-pay | Admitting: Thoracic Surgery (Cardiothoracic Vascular Surgery)

## 2021-02-21 VITALS — BP 110/73 | HR 90 | Resp 20 | Ht 64.0 in | Wt 230.0 lb

## 2021-02-21 DIAGNOSIS — D169 Benign neoplasm of bone and articular cartilage, unspecified: Secondary | ICD-10-CM

## 2021-02-21 NOTE — H&P (View-Only) (Signed)
PCP is Rosita Fire, MD Referring Provider is Virl Cagey, MD  Chief Complaint  Patient presents with   Consult    MR Chest 01/13/21    HPI: Ms. Heinlein is sent for consultation regarding a left ninth rib mass.  Caramia Boutin is a 41 year old woman with a past medical history significant for hypertension, hyperlipidemia, type II diabetes, bipolar disorder, asthma, arthritis, and a rib mass.  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  In February she was having some flank pain.  CT of the abdomen and pelvis showed an expansile left ninth rib lesion that has increased in size in comparison through a film in 2011.  She was having pain in that region again in September.  She was referred to Dr. Constance Haw of general surgery.  She saw her in November.  She ordered an MR, which showed 11.3 x 4.9 x 4.6 cm expansile lesion in the left ninth rib.  There was possible enlarging soft tissue component measuring 4.3 x 2.3 cm which had increased in comparison to the CT from 9 months prior.  She continues to have pain in that area.  She says she has a bit of pillow behind that area when she drives.  There are certain movements with resultant pain.  She does not have pain at rest.  She was seen in the emergency room last week with calf pain and swelling.  Ultrasound was negative for DVT. Past Medical History:  Diagnosis Date   Abnormal Pap smear    Arthritis    Asthma    Asthma    Bipolar 1 disorder ([place])    Bone spur    rib   Bronchitis    BV (bacterial vaginosis) 10/08/2019   8/19 rx flagyl   Carpal tunnel syndrome    Cervical spine pain    Chest pain    Diabetes mellitus without complication (HCC)    Elevated cholesterol    High cholesterol    Hyperlipidemia    Hyperlipoproteinemia    Hypertension    Post traumatic stress disorder    Right ovarian cyst 10/31/2012   Complex right ovarian cyst seen 8/18 in ER need f/u US    Tendonitis    Vaginal Pap smear, abnormal     Past Surgical History:  Procedure Laterality Date   CARPAL TUNNEL RELEASE Right 06/28/2016   Procedure: RIGHT CARPAL TUNNEL RELEASE;  Surgeon: Carole Civil, MD;  Location: [facility];  Service: Orthopedics;  Laterality: Right;   HERNIA REPAIR     umbilical    Family History  Problem Relation Age of Onset   Diabetes Mother    Hypertension Mother    Asthma Father    Bronchitis Daughter    Asthma Daughter    Stroke Paternal Grandmother    Heart attack Paternal Grandmother    Cancer Paternal Grandmother    Asthma Son    Bronchitis Son    Asthma Daughter    Bronchitis Daughter    Asthma Son    Asthma Other    Bronchitis Other    Cancer Cousin     Social History Social History   Tobacco Use   Smoking status: Never   Smokeless tobacco: Never  Vaping Use   Vaping Use: Never used  Substance Use Topics   Alcohol use: No   Drug use: No    Current Outpatient Medications  Medication Sig Dispense Refill  acetaminophen (TYLENOL) 500 MG tablet Take 500 mg by mouth every 6 (six) hours as needed for mild pain.     hydrochlorothiazide (HYDRODIURIL) 12.5 MG tablet Take 6.25 mg by mouth daily.     lisinopril (PRINIVIL,ZESTRIL) 10 MG tablet Take 5 mg by mouth daily.     lisinopril (ZESTRIL) 5 MG tablet Take 5 mg by mouth daily.     metFORMIN (GLUCOPHAGE) 500 MG tablet Take 500 mg by mouth 2 (two) times daily with a meal.     naproxen (NAPROSYN) 500 MG tablet Take 1 tablet (500 mg total) by mouth 2 (two) times daily with a meal. 30 tablet 0   pravastatin (PRAVACHOL) 40 MG tablet Take 40 mg by mouth daily.     PROAIR HFA 108 (90 Base) MCG/ACT inhaler Inhale 1-2 puffs into the lungs every 6 (six) hours as needed for wheezing.     Current Facility-Administered Medications  Medication Dose Route Frequency Provider Last Rate Last Admin   prenatal vitamin w/FE, FA (NATACHEW) chewable tablet 1 tablet  1 tablet Oral Q1200 Cresenzo-Dishmon,  Joaquim Lai, CNM        Allergies  Allergen Reactions   Darvocet [Propoxyphene N-Acetaminophen] Itching   Hydrocodone-Acetaminophen Itching   Lactose Intolerance (Gi) Other (See Comments)    Causes stomach cramping.   Other Hives    Patient is allergic to corndogs.   Penicillins Hives and Itching    Has patient had a PCN reaction causing immediate rash, facial/tongue/throat swelling, SOB or lightheadedness with hypotension:Yes Has patient had a PCN reaction causing severe rash involving mucus membranes or skin necrosis:No Has patient had a PCN reaction that required hospitalization:No Has patient had a PCN reaction occurring within the last 10 years:No If all of the above answers are "NO", then may proceed with Cephalosporin use.    Latex Itching and Rash   Percocet [Oxycodone-Acetaminophen] Itching and Rash    Review of Systems  Constitutional:  Negative for activity change, fever and unexpected weight change.  HENT:  Negative for trouble swallowing and voice change.   Respiratory:  Positive for wheezing (occasional). Negative for shortness of breath.   Cardiovascular:  Positive for leg swelling. Negative for chest pain and palpitations.  Genitourinary:  Negative for difficulty urinating and dysuria.  Musculoskeletal:  Positive for back pain.       Rib pain  Neurological:  Negative for syncope and weakness.  Hematological:  Negative for adenopathy. Does not bruise/bleed easily.  Psychiatric/Behavioral:  Positive for dysphoric mood. The patient is nervous/anxious.   All other systems reviewed and are negative.  Ht 5\' 4"  (1.626 m)    Wt 230 lb (104.3 kg)    LMP 02/12/2021    BMI 39.48 kg/m  Physical Exam Vitals reviewed.  Constitutional:      General: She is not in acute distress.    Appearance: She is obese.  HENT:     Head: Normocephalic and atraumatic.  Eyes:     General: No scleral icterus.    Extraocular Movements: Extraocular movements intact.  Cardiovascular:      Rate and Rhythm: Normal rate and regular rhythm.     Heart sounds: Normal heart sounds. No murmur heard. Pulmonary:     Effort: Pulmonary effort is normal. No respiratory distress.     Breath sounds: Normal breath sounds. No wheezing.  Abdominal:     General: There is no distension.     Palpations: Abdomen is soft.     Tenderness: There is no abdominal tenderness.  Musculoskeletal:     Comments: Hard palpable mass left posterior flank approximately 6 x 12 cm.  Lymphadenopathy:     Cervical: No cervical adenopathy.  Skin:    General: Skin is warm and dry.  Neurological:     General: No focal deficit present.     Mental Status: She is oriented to person, place, and time.     Cranial Nerves: No cranial nerve deficit.     Motor: No weakness.     Gait: Gait normal.  Psychiatric:        Mood and Affect: Mood normal.     Diagnostic Tests: MRI CHEST WITHOUT AND WITH CONTRAST   TECHNIQUE: Multiplanar imaging of the chest with attention to the posterior left chest wall was performed. Study includes post-contrast imaging.   CONTRAST:  31mL GADAVIST GADOBUTROL 1 MMOL/ML IV SOLN   COMPARISON:  Abdominopelvic CT 03/24/2020 and additional priors dating back to a chest CT dated 04/23/2009.   FINDINGS: Cardiovascular: The visualized heart and vascular structures appear unremarkable. No pericardial effusion.   Mediastinum/Nodes: No enlarged lymph nodes are seen within the visualized mediastinum or inferior left hilar regions.The visualized mediastinum appears normal.   Lungs/Pleura: No left-sided pleural effusion. The visualized left lung is grossly clear aside from probable mild compressive lower lobe atelectasis.   Upper abdomen:  The visualized upper abdomen appears unremarkable.   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 This has been present on prior studies dating back to at least 04/23/2009. This lesion is difficult  to accurately measure given its shape, although appears similar in overall size from the remote priors. Measurements are approximately 11.3 x 4.9 x 4.6 cm. This lesion demonstrates fairly homogeneous T1 signal isointense to muscle, heterogeneous T2 signal and heterogeneous enhancement following contrast. There is a peripheral rim of dense sclerotic bone. The sclerotic margins of the lesion appear thinner along the anterior inferior aspect compared with the remote CT, and there may be an associated enlarging soft tissue component which measures approximately 4.3 x 2.3 cm on axial image 26/11. This compares with approximately 3.8 x 2.4 cm on the CT from 9 months ago. No other chest wall masses or rib lesions are seen. There is mild soft tissue edema within the lateral abdominal wall inferior to this lesion.   IMPRESSION: 1. The large expansile lesion of the left 9th rib does not appear grossly changed in overall size from remote prior studies, and remains most consistent with fibrous dysplasia. However, some erosion of the anterior inferior cortex of this lesion has developed, and there may be an enlarging soft tissue component anteriorly, findings which could indicate malignant transformation, especially if this lesion is now symptomatic. No other aggressive features are identified. Consider tissue sampling or short-term follow-up imaging. 2. No other suspicious osseous lesions or associated pleural effusion.     Electronically Signed   By: Richardean Sale M.D.   On: 01/13/2021 17:50 I personally reviewed the MR images.  There is a large mass involving the left ninth rib measuring 11.3 x 4.9 x 4.6 cm.  Some erosion of the cortical bone.  Impression: Breanna Malone is a 41 year old woman with a past medical history significant for hypertension, hyperlipidemia, type II diabetes, bipolar disorder, asthma, arthritis, and a rib mass.   She has had an expansile lesion of the left ninth  rib for many years.  Until recently had last been imaged in 2011.  It was quite large at that time.  In February she had a CT of the abdomen and pelvis when she was being evaluated for flank pain.  The lesion had increased in size in comparison to her scan from 2011.  More recently she was in the emergency room in September with left-sided flank pain.  Repeat imaging was not done, but did lead to a referral to general surgery had an MR that showed 11.3 x 4.9 x 4.6 cm expansile lesion of the left ninth rib with some increased soft tissue component and concern for possible malignant transformation.  I reviewed the images with Ms. Casasola.  We discussed that this likely is benign although we cannot rule out malignant transformation.  We can attempt to biopsy the lesion, but given its size there is no way we could know for sure there was not bleeding transformation in some part of that.  Given that it is causing her discomfort, I think the best option would be to surgically resect the lesion.  The other option would be to continue with radiographic follow-up.  However, if there has been malignant transformation it could grow or spread significantly during the follow-up period.  I discussed the proposed surgical procedure with her.  We would plan to resect the mass.  It would be a relatively large incision and will essentially have to remove the entire ninth rib.  It may require mesh to repair the defect.  She will need a drainage tube postoperatively and I would expect her to the hospital for somewhere in the order of 2 to 4 days.  She is the only adult in the household and has a 54-year-old child.  She will need to make some arrangements for her postoperative recovery.  I informed her of the general nature of the proposed procedure including the need for general anesthesia, the incision to be used, the use of a drainage tube, the expected hospital stay, and the overall recovery.  I informed her of the  indications, risk, benefits, and alternatives.  She understands the risks include, but not limited to death, MI, DVT, PE, bleeding, possible need for transfusion, infection, hernia, chronic pain, as well as possibility of other unforeseeable complications.   Plan: We will call her to schedule the surgery later this month  Melrose Nakayama, MD [facility] [phone]

## 2021-02-21 NOTE — Progress Notes (Signed)
PCP is Rosita Fire, MD Referring Provider is Virl Cagey, MD  Chief Complaint  Patient presents with   Consult    MR Chest 01/13/21    HPI: Breanna Malone is sent for consultation regarding a left ninth rib mass.  Breanna Malone is a 41 year old woman with a past medical history significant for hypertension, hyperlipidemia, type II diabetes, bipolar disorder, asthma, arthritis, and a rib mass.  Breanna Malone has had a known rib mass dating back to at least 2011.  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  In February Breanna Malone was having some flank pain.  CT of the abdomen and pelvis showed an expansile left ninth rib lesion that has increased in size in comparison through a film in 2011.  Breanna Malone was having pain in that region again in September.  Breanna Malone was referred to Dr. Constance Haw of general surgery.  Breanna Malone saw her in November.  Breanna Malone ordered an MR, which showed 11.3 x 4.9 x 4.6 cm expansile lesion in the left ninth rib.  There was possible enlarging soft tissue component measuring 4.3 x 2.3 cm which had increased in comparison to the CT from 9 months prior.  Breanna Malone continues to have pain in that area.  Breanna Malone says Breanna Malone has a bit of pillow behind that area when Breanna Malone drives.  There are certain movements with resultant pain.  Breanna Malone does not have pain at rest.  Breanna Malone was seen in the emergency room last week with calf pain and swelling.  Ultrasound was negative for DVT. Past Medical History:  Diagnosis Date   Abnormal Pap smear    Arthritis    Asthma    Asthma    Bipolar 1 disorder ([place])    Bone spur    rib   Bronchitis    BV (bacterial vaginosis) 10/08/2019   8/19 rx flagyl   Carpal tunnel syndrome    Cervical spine pain    Chest pain    Diabetes mellitus without complication (HCC)    Elevated cholesterol    High cholesterol    Hyperlipidemia    Hyperlipoproteinemia    Hypertension    Post traumatic stress disorder    Right ovarian cyst 10/31/2012   Complex right ovarian cyst seen 8/18 in ER need f/u US    Tendonitis    Vaginal Pap smear, abnormal     Past Surgical History:  Procedure Laterality Date   CARPAL TUNNEL RELEASE Right 06/28/2016   Procedure: RIGHT CARPAL TUNNEL RELEASE;  Surgeon: Carole Civil, MD;  Location: [facility];  Service: Orthopedics;  Laterality: Right;   HERNIA REPAIR     umbilical    Family History  Problem Relation Age of Onset   Diabetes Mother    Hypertension Mother    Asthma Father    Bronchitis Daughter    Asthma Daughter    Stroke Paternal Grandmother    Heart attack Paternal Grandmother    Cancer Paternal Grandmother    Asthma Son    Bronchitis Son    Asthma Daughter    Bronchitis Daughter    Asthma Son    Asthma Other    Bronchitis Other    Cancer Cousin     Social History Social History   Tobacco Use   Smoking status: Never   Smokeless tobacco: Never  Vaping Use   Vaping Use: Never used  Substance Use Topics   Alcohol use: No   Drug use: No    Current Outpatient Medications  Medication Sig Dispense Refill  acetaminophen (TYLENOL) 500 MG tablet Take 500 mg by mouth every 6 (six) hours as needed for mild pain.     hydrochlorothiazide (HYDRODIURIL) 12.5 MG tablet Take 6.25 mg by mouth daily.     lisinopril (PRINIVIL,ZESTRIL) 10 MG tablet Take 5 mg by mouth daily.     lisinopril (ZESTRIL) 5 MG tablet Take 5 mg by mouth daily.     metFORMIN (GLUCOPHAGE) 500 MG tablet Take 500 mg by mouth 2 (two) times daily with a meal.     naproxen (NAPROSYN) 500 MG tablet Take 1 tablet (500 mg total) by mouth 2 (two) times daily with a meal. 30 tablet 0   pravastatin (PRAVACHOL) 40 MG tablet Take 40 mg by mouth daily.     PROAIR HFA 108 (90 Base) MCG/ACT inhaler Inhale 1-2 puffs into the lungs every 6 (six) hours as needed for wheezing.     Current Facility-Administered Medications  Medication Dose Route Frequency Provider Last Rate Last Admin   prenatal vitamin w/FE, FA (NATACHEW) chewable tablet 1 tablet  1 tablet Oral Q1200 Cresenzo-Dishmon,  Joaquim Lai, CNM        Allergies  Allergen Reactions   Darvocet [Propoxyphene N-Acetaminophen] Itching   Hydrocodone-Acetaminophen Itching   Lactose Intolerance (Gi) Other (See Comments)    Causes stomach cramping.   Other Hives    Patient is allergic to corndogs.   Penicillins Hives and Itching    Has patient had a PCN reaction causing immediate rash, facial/tongue/throat swelling, SOB or lightheadedness with hypotension:Yes Has patient had a PCN reaction causing severe rash involving mucus membranes or skin necrosis:No Has patient had a PCN reaction that required hospitalization:No Has patient had a PCN reaction occurring within the last 10 years:No If all of the above answers are "NO", then may proceed with Cephalosporin use.    Latex Itching and Rash   Percocet [Oxycodone-Acetaminophen] Itching and Rash    Review of Systems  Constitutional:  Negative for activity change, fever and unexpected weight change.  HENT:  Negative for trouble swallowing and voice change.   Respiratory:  Positive for wheezing (occasional). Negative for shortness of breath.   Cardiovascular:  Positive for leg swelling. Negative for chest pain and palpitations.  Genitourinary:  Negative for difficulty urinating and dysuria.  Musculoskeletal:  Positive for back pain.       Rib pain  Neurological:  Negative for syncope and weakness.  Hematological:  Negative for adenopathy. Does not bruise/bleed easily.  Psychiatric/Behavioral:  Positive for dysphoric mood. The patient is nervous/anxious.   All other systems reviewed and are negative.  Ht 5\' 4"  (1.626 m)    Wt 230 lb (104.3 kg)    LMP 02/12/2021    BMI 39.48 kg/m  Physical Exam Vitals reviewed.  Constitutional:      General: Breanna Malone is not in acute distress.    Appearance: Breanna Malone is obese.  HENT:     Head: Normocephalic and atraumatic.  Eyes:     General: No scleral icterus.    Extraocular Movements: Extraocular movements intact.  Cardiovascular:      Rate and Rhythm: Normal rate and regular rhythm.     Heart sounds: Normal heart sounds. No murmur heard. Pulmonary:     Effort: Pulmonary effort is normal. No respiratory distress.     Breath sounds: Normal breath sounds. No wheezing.  Abdominal:     General: There is no distension.     Palpations: Abdomen is soft.     Tenderness: There is no abdominal tenderness.  Musculoskeletal:     Comments: Hard palpable mass left posterior flank approximately 6 x 12 cm.  Lymphadenopathy:     Cervical: No cervical adenopathy.  Skin:    General: Skin is warm and dry.  Neurological:     General: No focal deficit present.     Mental Status: Breanna Malone is oriented to person, place, and time.     Cranial Nerves: No cranial nerve deficit.     Motor: No weakness.     Gait: Gait normal.  Psychiatric:        Mood and Affect: Mood normal.     Diagnostic Tests: MRI CHEST WITHOUT AND WITH CONTRAST   TECHNIQUE: Multiplanar imaging of the chest with attention to the posterior left chest wall was performed. Study includes post-contrast imaging.   CONTRAST:  22mL GADAVIST GADOBUTROL 1 MMOL/ML IV SOLN   COMPARISON:  Abdominopelvic CT 03/24/2020 and additional priors dating back to a chest CT dated 04/23/2009.   FINDINGS: Cardiovascular: The visualized heart and vascular structures appear unremarkable. No pericardial effusion.   Mediastinum/Nodes: No enlarged lymph nodes are seen within the visualized mediastinum or inferior left hilar regions.The visualized mediastinum appears normal.   Lungs/Pleura: No left-sided pleural effusion. The visualized left lung is grossly clear aside from probable mild compressive lower lobe atelectasis.   Upper abdomen:  The visualized upper abdomen appears unremarkable.   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 This has been present on prior studies dating back to at least 04/23/2009. This lesion is difficult  to accurately measure given its shape, although appears similar in overall size from the remote priors. Measurements are approximately 11.3 x 4.9 x 4.6 cm. This lesion demonstrates fairly homogeneous T1 signal isointense to muscle, heterogeneous T2 signal and heterogeneous enhancement following contrast. There is a peripheral rim of dense sclerotic bone. The sclerotic margins of the lesion appear thinner along the anterior inferior aspect compared with the remote CT, and there may be an associated enlarging soft tissue component which measures approximately 4.3 x 2.3 cm on axial image 26/11. This compares with approximately 3.8 x 2.4 cm on the CT from 9 months ago. No other chest wall masses or rib lesions are seen. There is mild soft tissue edema within the lateral abdominal wall inferior to this lesion.   IMPRESSION: 1. The large expansile lesion of the left 9th rib does not appear grossly changed in overall size from remote prior studies, and remains most consistent with fibrous dysplasia. However, some erosion of the anterior inferior cortex of this lesion has developed, and there may be an enlarging soft tissue component anteriorly, findings which could indicate malignant transformation, especially if this lesion is now symptomatic. No other aggressive features are identified. Consider tissue sampling or short-term follow-up imaging. 2. No other suspicious osseous lesions or associated pleural effusion.     Electronically Signed   By: Richardean Sale M.D.   On: 01/13/2021 17:50 I personally reviewed the MR images.  There is a large mass involving the left ninth rib measuring 11.3 x 4.9 x 4.6 cm.  Some erosion of the cortical bone.  Impression: Breanna Malone is a 41 year old woman with a past medical history significant for hypertension, hyperlipidemia, type II diabetes, bipolar disorder, asthma, arthritis, and a rib mass.   Breanna Malone has had an expansile lesion of the left ninth  rib for many years.  Until recently had last been imaged in 2011.  It was quite large at that time.  In February Breanna Malone had a CT of the abdomen and pelvis when Breanna Malone was being evaluated for flank pain.  The lesion had increased in size in comparison to her scan from 2011.  More recently Breanna Malone was in the emergency room in September with left-sided flank pain.  Repeat imaging was not done, but did lead to a referral to general surgery had an MR that showed 11.3 x 4.9 x 4.6 cm expansile lesion of the left ninth rib with some increased soft tissue component and concern for possible malignant transformation.  I reviewed the images with Ms. Twitty.  We discussed that this likely is benign although we cannot rule out malignant transformation.  We can attempt to biopsy the lesion, but given its size there is no way we could know for sure there was not bleeding transformation in some part of that.  Given that it is causing her discomfort, I think the best option would be to surgically resect the lesion.  The other option would be to continue with radiographic follow-up.  However, if there has been malignant transformation it could grow or spread significantly during the follow-up period.  I discussed the proposed surgical procedure with her.  We would plan to resect the mass.  It would be a relatively large incision and will essentially have to remove the entire ninth rib.  It may require mesh to repair the defect.  Breanna Malone will need a drainage tube postoperatively and I would expect her to the hospital for somewhere in the order of 2 to 4 days.  Breanna Malone is the only adult in the household and has a 52-year-old child.  Breanna Malone will need to make some arrangements for her postoperative recovery.  I informed her of the general nature of the proposed procedure including the need for general anesthesia, the incision to be used, the use of a drainage tube, the expected hospital stay, and the overall recovery.  I informed her of the  indications, risk, benefits, and alternatives.  Breanna Malone understands the risks include, but not limited to death, MI, DVT, PE, bleeding, possible need for transfusion, infection, hernia, chronic pain, as well as possibility of other unforeseeable complications.   Plan: We will call her to schedule the surgery later this month  Melrose Nakayama, MD [facility] [phone]

## 2021-02-22 ENCOUNTER — Other Ambulatory Visit: Payer: Self-pay | Admitting: *Deleted

## 2021-02-22 ENCOUNTER — Encounter: Payer: Self-pay | Admitting: *Deleted

## 2021-02-22 ENCOUNTER — Ambulatory Visit (HOSPITAL_COMMUNITY)
Admission: RE | Admit: 2021-02-22 | Discharge: 2021-02-22 | Disposition: A | Discharge status: Home / Self Care | Payer: Medicaid Other | Source: Ambulatory Visit | Attending: Emergency Medicine | Admitting: Emergency Medicine

## 2021-02-22 DIAGNOSIS — M79604 Pain in right leg: Secondary | ICD-10-CM | POA: Insufficient documentation

## 2021-02-22 DIAGNOSIS — D169 Benign neoplasm of bone and articular cartilage, unspecified: Secondary | ICD-10-CM

## 2021-02-22 DIAGNOSIS — M7989 Other specified soft tissue disorders: Secondary | ICD-10-CM | POA: Insufficient documentation

## 2021-03-13 NOTE — Progress Notes (Signed)
Surgical Instructions    Your procedure is scheduled on Thursday, January 26th, 2023.   Report to [facility] Main Entrance "A" at 06:00 A.M., then check in with the Admitting office.  Call this number if you have problems the morning of surgery:  [phone]   If you have any questions prior to your surgery date call [phone]: Open Monday-Friday 8am-4pm    Remember:  Do not eat or drink after midnight the night before your surgery     Take these medicines the morning of surgery with A SIP OF WATER:  pravastatin (PRAVACHOL) PROAIR HFA - if needed - please, bring the inhaler with you the day of surgery    As of today, STOP taking any Aspirin (unless otherwise instructed by your surgeon) Aleve, Naproxen, Ibuprofen, Motrin, Advil, Goody's, BC's, all herbal medications, fish oil, and all vitamins.   WHAT DO I DO ABOUT MY DIABETES MEDICATION?   Do not take metFORMIN (GLUCOPHAGE) the morning of surgery.   HOW TO MANAGE YOUR DIABETES BEFORE AND AFTER SURGERY  Why is it important to control my blood sugar before and after surgery? Improving blood sugar levels before and after surgery helps healing and can limit problems. A way of improving blood sugar control is eating a healthy diet by:  Eating less sugar and carbohydrates  Increasing activity/exercise  Talking with your doctor about reaching your blood sugar goals High blood sugars (greater than 180 mg/dL) can raise your risk of infections and slow your recovery, so you will need to focus on controlling your diabetes during the weeks before surgery. Make sure that the doctor who takes care of your diabetes knows about your planned surgery including the date and location.  How do I manage my blood sugar before surgery? Check your blood sugar at least 4 times a day, starting 2 days before surgery, to make sure that the level is not too high or low.  Check your blood sugar the morning of your surgery when you wake up and  every 2 hours until you get to the Short Stay unit.  If your blood sugar is less than 70 mg/dL, you will need to treat for low blood sugar: Do not take insulin. Treat a low blood sugar (less than 70 mg/dL) with  cup of clear juice (cranberry or apple), 4 glucose tablets, OR glucose gel. Recheck blood sugar in 15 minutes after treatment (to make sure it is greater than 70 mg/dL). If your blood sugar is not greater than 70 mg/dL on recheck, call [phone] for further instructions. Report your blood sugar to the short stay nurse when you get to Short Stay.  If you are admitted to the hospital after surgery: Your blood sugar will be checked by the staff and you will probably be given insulin after surgery (instead of oral diabetes medicines) to make sure you have good blood sugar levels. The goal for blood sugar control after surgery is 80-180 mg/dL.   After your COVID test   You are not required to quarantine however you are required to wear a well-fitting mask when you are out and around people not in your household.  If your mask becomes wet or soiled, replace with a new one.  Wash your hands often with soap and water for 20 seconds or clean your hands with an alcohol-based hand sanitizer that contains at least 60% alcohol.  Do not share personal items.  Notify your provider: if you are in close contact with someone who has  COVID  or if you develop a fever of 100.4 or greater, sneezing, cough, sore throat, shortness of breath or body aches.     The day of surgery:         Do not wear jewelry or makeup Do not wear lotions, powders, perfumes, or deodorant. Do not shave 48 hours prior to surgery.   Do not bring valuables to the hospital. DO Not wear nail polish, gel polish, artificial nails, or any other type of covering on natural nails (fingers and toes) If you have artificial nails or gel coating that need to be removed by a nail salon, please have this removed prior to surgery.  Artificial nails or gel coating may interfere with anesthesia's ability to adequately monitor your vital signs.              [facility] is not responsible for any belongings or valuables.  Do NOT Smoke (Tobacco/Vaping)  24 hours prior to your procedure  If you use a CPAP at night, you may bring your mask for your overnight stay.   Contacts, glasses, hearing aids, dentures or partials may not be worn into surgery, please bring cases for these belongings   For patients admitted to the hospital, discharge time will be determined by your treatment team.   Patients discharged the day of surgery will not be allowed to drive home, and someone needs to stay with them for 24 hours.  NO VISITORS WILL BE ALLOWED IN PRE-OP WHERE PATIENTS ARE PREPPED FOR SURGERY.  ONLY 1 SUPPORT PERSON MAY BE PRESENT IN THE WAITING ROOM WHILE YOU ARE IN SURGERY.  IF YOU ARE TO BE ADMITTED, ONCE YOU ARE IN YOUR ROOM YOU WILL BE ALLOWED TWO (2) VISITORS. 1 (ONE) VISITOR MAY STAY OVERNIGHT BUT MUST ARRIVE TO THE ROOM BY 8pm.  Minor children may have two parents present. Special consideration for safety and communication needs will be reviewed on a case by case basis.  Special instructions:    Oral Hygiene is also important to reduce your risk of infection.  Remember - BRUSH YOUR TEETH THE MORNING OF SURGERY WITH YOUR REGULAR TOOTHPASTE   [place]- Preparing For Surgery  Before surgery, you can play an important role. Because skin is not sterile, your skin needs to be as free of germs as possible. You can reduce the number of germs on your skin by washing with CHG (chlorahexidine gluconate) Soap before surgery.  CHG is an antiseptic cleaner which kills germs and bonds with the skin to continue killing germs even after washing.     Please do not use if you have an allergy to CHG or antibacterial soaps. If your skin becomes reddened/irritated stop using the CHG.  Do not shave (including legs and underarms) for at least  48 hours prior to first CHG shower. It is OK to shave your face.  Please follow these instructions carefully.     Shower the NIGHT BEFORE SURGERY and the MORNING OF SURGERY with CHG Soap.   If you chose to wash your hair, wash your hair first as usual with your normal shampoo. After you shampoo, rinse your hair and body thoroughly to remove the shampoo.  Then [employer] and genitals (private parts) with your normal soap and rinse thoroughly to remove soap.  After that Use CHG Soap as you would any other liquid soap. You can apply CHG directly to the skin and wash gently with a scrungie or a clean washcloth.   Apply the CHG  Soap to your body ONLY FROM THE NECK DOWN.  Do not use on open wounds or open sores. Avoid contact with your eyes, ears, mouth and genitals (private parts). Wash Face and genitals (private parts)  with your normal soap.   Wash thoroughly, paying special attention to the area where your surgery will be performed.  Thoroughly rinse your body with warm water from the neck down.  DO NOT shower/wash with your normal soap after using and rinsing off the CHG Soap.  Pat yourself dry with a CLEAN TOWEL.  Wear CLEAN PAJAMAS to bed the night before surgery  Place CLEAN SHEETS on your bed the night before your surgery  DO NOT SLEEP WITH PETS.   Day of Surgery:  Take a shower with CHG soap. Wear Clean/Comfortable clothing the morning of surgery Do not apply any deodorants/lotions.   Remember to brush your teeth WITH YOUR REGULAR TOOTHPASTE.   Please read over the following fact sheets that you were given.

## 2021-03-14 ENCOUNTER — Encounter (HOSPITAL_COMMUNITY): Payer: Self-pay

## 2021-03-14 ENCOUNTER — Ambulatory Visit (HOSPITAL_COMMUNITY)
Admission: RE | Admit: 2021-03-14 | Discharge: 2021-03-14 | Disposition: A | Discharge status: Home / Self Care | Payer: Medicaid Other | Source: Ambulatory Visit | Attending: Thoracic Surgery (Cardiothoracic Vascular Surgery) | Admitting: Thoracic Surgery (Cardiothoracic Vascular Surgery)

## 2021-03-14 ENCOUNTER — Encounter (HOSPITAL_COMMUNITY)
Admission: RE | Admit: 2021-03-14 | Discharge: 2021-03-14 | Disposition: A | Discharge status: Home / Self Care | Payer: Medicaid Other | Source: Ambulatory Visit | Attending: Thoracic Surgery (Cardiothoracic Vascular Surgery) | Admitting: Thoracic Surgery (Cardiothoracic Vascular Surgery)

## 2021-03-14 ENCOUNTER — Other Ambulatory Visit: Payer: Self-pay

## 2021-03-14 VITALS — BP 133/80 | HR 92 | Temp 98.2°F | Resp 18 | Ht 64.0 in | Wt 237.0 lb

## 2021-03-14 DIAGNOSIS — D167 Benign neoplasm of ribs, sternum and clavicle: Secondary | ICD-10-CM | POA: Diagnosis not present

## 2021-03-14 DIAGNOSIS — Z6841 Body Mass Index (BMI) 40.0 and over, adult: Secondary | ICD-10-CM | POA: Insufficient documentation

## 2021-03-14 DIAGNOSIS — Z01818 Encounter for other preprocedural examination: Secondary | ICD-10-CM | POA: Diagnosis present

## 2021-03-14 DIAGNOSIS — J45909 Unspecified asthma, uncomplicated: Secondary | ICD-10-CM | POA: Insufficient documentation

## 2021-03-14 DIAGNOSIS — Z20822 Contact with and (suspected) exposure to covid-19: Secondary | ICD-10-CM | POA: Diagnosis not present

## 2021-03-14 DIAGNOSIS — E7800 Pure hypercholesterolemia, unspecified: Secondary | ICD-10-CM | POA: Insufficient documentation

## 2021-03-14 DIAGNOSIS — I10 Essential (primary) hypertension: Secondary | ICD-10-CM | POA: Insufficient documentation

## 2021-03-14 DIAGNOSIS — F4310 Post-traumatic stress disorder, unspecified: Secondary | ICD-10-CM | POA: Diagnosis not present

## 2021-03-14 DIAGNOSIS — D169 Benign neoplasm of bone and articular cartilage, unspecified: Secondary | ICD-10-CM | POA: Diagnosis present

## 2021-03-14 DIAGNOSIS — F319 Bipolar disorder, unspecified: Secondary | ICD-10-CM | POA: Diagnosis not present

## 2021-03-14 DIAGNOSIS — E119 Type 2 diabetes mellitus without complications: Secondary | ICD-10-CM | POA: Diagnosis not present

## 2021-03-14 LAB — GLUCOSE, CAPILLARY: Glucose-Capillary: 104 mg/dL — ABNORMAL HIGH (ref 70–99)

## 2021-03-14 LAB — URINALYSIS, ROUTINE W REFLEX MICROSCOPIC
Bilirubin Urine: NEGATIVE
Glucose, UA: NEGATIVE mg/dL
Ketones, ur: NEGATIVE mg/dL
Leukocytes,Ua: NEGATIVE
Nitrite: NEGATIVE
Protein, ur: NEGATIVE mg/dL
Specific Gravity, Urine: 1.023 (ref 1.005–1.030)
pH: 5 (ref 5.0–8.0)

## 2021-03-14 LAB — COMPREHENSIVE METABOLIC PANEL
ALT: 33 U/L (ref 0–44)
AST: 32 U/L (ref 15–41)
Albumin: 3.9 g/dL (ref 3.5–5.0)
Alkaline Phosphatase: 118 U/L (ref 38–126)
Anion gap: 10 (ref 5–15)
BUN: 7 mg/dL (ref 6–20)
CO2: 24 mmol/L (ref 22–32)
Calcium: 9.5 mg/dL (ref 8.9–10.3)
Chloride: 103 mmol/L (ref 98–111)
Creatinine, Ser: 0.72 mg/dL (ref 0.44–1.00)
GFR, Estimated: >60 mL/min (ref >60)
Glucose, Bld: 122 mg/dL — ABNORMAL HIGH (ref 70–99)
Potassium: 3.8 mmol/L (ref 3.5–5.1)
Sodium: 137 mmol/L (ref 135–145)
Total Bilirubin: 0.7 mg/dL (ref 0.3–1.2)
Total Protein: 7.1 g/dL (ref 6.5–8.1)

## 2021-03-14 LAB — CBC
HCT: 38.3 % (ref 36.0–46.0)
Hemoglobin: 12.5 g/dL (ref 12.0–15.0)
MCH: 31.3 pg (ref 26.0–34.0)
MCHC: 32.6 g/dL (ref 30.0–36.0)
MCV: 96 fL (ref 80.0–100.0)
Platelets: 334 10*3/uL (ref 150–400)
RBC: 3.99 MIL/uL (ref 3.87–5.11)
RDW: 11.5 % (ref 11.5–15.5)
WBC: 8 10*3/uL (ref 4.0–10.5)
nRBC: 0 % (ref 0.0–0.2)

## 2021-03-14 LAB — PROTIME-INR
INR: 1 (ref 0.8–1.2)
Prothrombin Time: 12.7 seconds (ref 11.4–15.2)

## 2021-03-14 LAB — BLOOD GAS, ARTERIAL
Acid-Base Excess: 2.8 mmol/L — ABNORMAL HIGH (ref 0.0–2.0)
Bicarbonate: 27 mmol/L (ref 20.0–28.0)
Drawn by: 60286
FIO2: 21
O2 Saturation: 97.9 %
Patient temperature: 37
pCO2 arterial: 43.2 mmHg (ref 32.0–48.0)
pH, Arterial: 7.412 (ref 7.350–7.450)
pO2, Arterial: 102 mmHg (ref 83.0–108.0)

## 2021-03-14 LAB — SARS CORONAVIRUS 2 (TAT 6-24 HRS): SARS Coronavirus 2: NEGATIVE

## 2021-03-14 LAB — SURGICAL PCR SCREEN
MRSA, PCR: NEGATIVE
Staphylococcus aureus: NEGATIVE

## 2021-03-14 LAB — TYPE AND SCREEN
ABO/RH(D): O POS
Antibody Screen: NEGATIVE

## 2021-03-14 LAB — APTT: aPTT: 27 seconds (ref 24–36)

## 2021-03-14 LAB — HEMOGLOBIN A1C
Hgb A1c MFr Bld: 6.3 % — ABNORMAL HIGH (ref 4.8–5.6)
Mean Plasma Glucose: 134 mg/dL

## 2021-03-14 NOTE — Progress Notes (Signed)
Abnormal UA in PAT. The result was transmitted to Levonne Spiller, RN - Dr. Roxan Hockey office.

## 2021-03-14 NOTE — Progress Notes (Addendum)
PCP - Carrolyn Meiers, MD Cardiologist - denies  PPM/ICD - denies Device Orders - n/a Rep Notified - n/a  Chest x-ray - 03/14/2021 EKG - 03/14/2021 Stress Test - 09/04/2016 ECHO - 09/04/2016 Cardiac Cath - denies  Sleep Study - denies CPAP - n/a  Fasting Blood Sugar - patient is not checking CBG at home CBG today - 104 A1C - done in PAT on 03/14/2021  Blood Thinner Instructions: n/a  Aspirin Instructions: Patient was instructed: As of today, STOP taking any Aspirin (unless otherwise instructed by your surgeon) Aleve, Naproxen, Ibuprofen, Motrin, Advil, Goody's, BC's, all herbal medications, fish oil, and all vitamins.  ERAS Protcol - n/a   COVID TEST- done in PAT on 03/14/2021   Anesthesia review: yes - abnormal EKG in PAT  Patient denies shortness of breath, fever, cough and chest pain at PAT appointment   All instructions explained to the patient, with a verbal understanding of the material. Patient agrees to go over the instructions while at home for a better understanding. Patient also instructed to self quarantine after being tested for COVID-19. The opportunity to ask questions was provided.

## 2021-03-14 NOTE — Progress Notes (Signed)
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 She was eventually referred to general surgeon Curlene Labrum, MD and seen on 12/27/20. Her notes suggest patient wasn't aware the mass was larger on the previous renal CT. 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 She was referred to CT surgery and the above procedure recommended given potential concern for malignant transformation.   Other history includes never smoker, HTN, DM2 asthma, hypercholesterolemia, chest pain (history of 2014, 2018), Bipolar 1 disorder, PTSD. BMI is consistent with morbid obesity.   Last cardiology evaluation was in 2018. No chest pain or SOB per PAT RN interview. Dr. Roxan Hockey does note that she does have continued pain in the area of her rib mass, more with certain movements but not at rest. Reviewed case with anesthesiologist Myrtie Soman, MD on 03/14/21. Anesthesia team to evaluate on the day of surgery.   03/14/2021 COVID-19 test negative.   VS: BP 133/80    Pulse 92    Temp 36.8 C    Resp 18    Ht 5\' 4"  (1.626 m)    Wt 107.5 kg    LMP 03/13/2021    SpO2 100%    BMI 40.68 kg/m    PROVIDERS: Carrolyn Meiers, MD is PCP  - She is not followed routinely by cardiology. Last office visit 08/07/16 by Jenkins Rouge, MD for atypical chest pain and presyncope event on 07/13/16  felt likely do to overheating and vagal response. As needed follow-up planned pending test results. 08/2016 echo showed LVEF 50% with diffuse LV hypokinesis, no significant valvular disease, no PFO seen. ETT was non-diagnostic due to EKG artifact and reduced exercise tolerance. She did not have a nuclear stress test.    LABS: Labs reviewed: Acceptable for surgery. A1c 6.3%.  (all labs ordered are listed, but only abnormal results are displayed)  Labs Reviewed  GLUCOSE, CAPILLARY - Abnormal; Notable for the following components:      Result Value   Glucose-Capillary 104 (*)    All other components within normal limits  COMPREHENSIVE METABOLIC PANEL - Abnormal; Notable for the following components:   Glucose, Bld 122 (*)    All other components within normal limits  BLOOD GAS, ARTERIAL - Abnormal; Notable for the following components:   Acid-Base Excess 2.8 (*)    All other components within normal limits  URINALYSIS, ROUTINE W REFLEX MICROSCOPIC - Abnormal; Notable for the following components:   APPearance HAZY (*)    Hgb urine dipstick MODERATE (*)    Bacteria, UA FEW (*)    All other components within normal limits  HEMOGLOBIN A1C - Abnormal; Notable for the following components:   Hgb A1c MFr Bld 6.3 (*)    All other components within normal limits  SURGICAL PCR SCREEN  SARS CORONAVIRUS 2 (TAT 6-24 HRS)  CBC  PROTIME-INR  APTT  TYPE AND SCREEN     IMAGES: CXR 03/14/21:  FINDINGS: The heart size and mediastinal contours are within normal limits. Both lungs are clear. A 13.6 cm x 7.3 cm lobulated expansile ninth left rib lesion is again seen. No acute osseous abnormalities are identified. IMPRESSION: 1. No active cardiopulmonary disease. 2. Predominantly stable expansile ninth left rib lesion.  MRI Chest 01/13/21: IMPRESSION: 1. The large expansile lesion of the left 9th rib [Measurements are approximately 11.3 x 4.9 x 4.6 cm] does not appear grossly changed in overall  size from remote prior studies, and remains most consistent with fibrous dysplasia. However, some erosion of the anterior inferior cortex of this lesion has developed, and there may be an enlarging soft tissue component anteriorly, findings which could indicate malignant transformation, especially if this lesion is now symptomatic. No other aggressive features are identified. Consider tissue sampling or short-term follow-up imaging. 2. No other suspicious osseous lesions or associated pleural effusion.    EKG: 03/14/21: Normal sinus rhythm Low voltage QRS Cannot rule out Anterior infarct , age undetermined Abnormal ECG - Poor r wave progression when compared to 10/20/17 tracing.   CV: [place] RLE Venous 02/22/21:  IMPRESSION: Suboptimal evaluation, within these constraints; No evidence of femoropopliteal DVT within the RIGHT lower extremity.   ETT 09/04/16: Nondiagnostic stress test due to significant artifact with exercise tracings and reduced exercise tolerance. Consider pharmacologic stress test if deemed clinically indicated.  Echo 09/04/16: Study Conclusions  - Left ventricle: The cavity size was normal. Wall thickness was    normal. Systolic function was low normal to mildly reduced. The    estimated ejection fraction was 50%. Diffuse hypokinesis. Left    ventricular diastolic function parameters were normal.   Past Medical History:  Diagnosis Date   Abnormal Pap smear    Arthritis    Asthma    Asthma    Bipolar 1 disorder (HCC)    Bone spur    rib   Bronchitis    BV (bacterial vaginosis) 10/08/2019   8/19 rx flagyl   Carpal tunnel syndrome    Cervical spine pain    Chest pain    Diabetes mellitus without complication (HCC)    Elevated cholesterol    High cholesterol    Hyperlipidemia    Hyperlipoproteinemia    Hypertension    Post traumatic stress disorder    Right ovarian cyst 10/31/2012   Complex right ovarian cyst seen 8/18 in ER need f/u US   Tendonitis     Vaginal Pap smear, abnormal     Past Surgical History:  Procedure Laterality Date   CARPAL TUNNEL RELEASE Right 06/28/2016   Procedure: RIGHT CARPAL TUNNEL RELEASE;  Surgeon: Carole Civil, MD;  Location: [facility];  Service: Orthopedics;  Laterality: Right;   HERNIA REPAIR     umbilical    MEDICATIONS:  hydrochlorothiazide (HYDRODIURIL) 12.5 MG tablet   ibuprofen (ADVIL) 200 MG tablet   lisinopril (ZESTRIL) 5 MG tablet   metFORMIN (GLUCOPHAGE) 500 MG tablet   naproxen (NAPROSYN) 500 MG tablet   pravastatin (PRAVACHOL) 40 MG tablet   PROAIR HFA 108 (90 Base) MCG/ACT inhaler    prenatal vitamin w/FE, FA (NATACHEW) chewable tablet 1 tablet    Myra Gianotti, PA-C Surgical Short Stay/Anesthesiology [facility] Phone [phone] [facility] Phone [phone] 03/15/2021 9:19 AM

## 2021-03-15 MED ORDER — VANCOMYCIN HCL 1500 MG/300ML IV SOLN
1500.0000 mg | INTRAVENOUS | Status: AC
  Administered 2021-03-16: 1500 mg via INTRAVENOUS
  Filled 2021-03-15: qty 300

## 2021-03-16 ENCOUNTER — Inpatient Hospital Stay (HOSPITAL_COMMUNITY): Payer: Medicaid Other | Admitting: Vascular Surgery

## 2021-03-16 ENCOUNTER — Inpatient Hospital Stay (HOSPITAL_COMMUNITY): Payer: Medicaid Other

## 2021-03-16 ENCOUNTER — Encounter (HOSPITAL_COMMUNITY)
Admission: RE | Disposition: A | Discharge status: Home / Self Care | Payer: Self-pay | Source: Home / Self Care | Attending: Thoracic Surgery (Cardiothoracic Vascular Surgery)

## 2021-03-16 ENCOUNTER — Inpatient Hospital Stay (HOSPITAL_COMMUNITY): Payer: Medicaid Other | Admitting: Certified Registered"

## 2021-03-16 ENCOUNTER — Other Ambulatory Visit: Payer: Self-pay

## 2021-03-16 ENCOUNTER — Inpatient Hospital Stay (HOSPITAL_COMMUNITY)
Admission: RE | Admit: 2021-03-16 | Discharge: 2021-03-20 | DRG: 167 | Disposition: A | Discharge status: Home / Self Care | Payer: Medicaid Other | Attending: Thoracic Surgery (Cardiothoracic Vascular Surgery) | Admitting: Thoracic Surgery (Cardiothoracic Vascular Surgery)

## 2021-03-16 ENCOUNTER — Encounter (HOSPITAL_COMMUNITY): Payer: Self-pay | Admitting: Thoracic Surgery (Cardiothoracic Vascular Surgery)

## 2021-03-16 DIAGNOSIS — I10 Essential (primary) hypertension: Secondary | ICD-10-CM | POA: Diagnosis present

## 2021-03-16 DIAGNOSIS — J939 Pneumothorax, unspecified: Secondary | ICD-10-CM

## 2021-03-16 DIAGNOSIS — E119 Type 2 diabetes mellitus without complications: Secondary | ICD-10-CM | POA: Diagnosis present

## 2021-03-16 DIAGNOSIS — Z8249 Family history of ischemic heart disease and other diseases of the circulatory system: Secondary | ICD-10-CM

## 2021-03-16 DIAGNOSIS — D492 Neoplasm of unspecified behavior of bone, soft tissue, and skin: Secondary | ICD-10-CM | POA: Diagnosis present

## 2021-03-16 DIAGNOSIS — Z825 Family history of asthma and other chronic lower respiratory diseases: Secondary | ICD-10-CM

## 2021-03-16 DIAGNOSIS — Z91018 Allergy to other foods: Secondary | ICD-10-CM | POA: Diagnosis not present

## 2021-03-16 DIAGNOSIS — D169 Benign neoplasm of bone and articular cartilage, unspecified: Secondary | ICD-10-CM

## 2021-03-16 DIAGNOSIS — Z885 Allergy status to narcotic agent status: Secondary | ICD-10-CM

## 2021-03-16 DIAGNOSIS — Z91040 Latex allergy status: Secondary | ICD-10-CM

## 2021-03-16 DIAGNOSIS — Z880 Allergy status to penicillin: Secondary | ICD-10-CM | POA: Diagnosis not present

## 2021-03-16 DIAGNOSIS — F319 Bipolar disorder, unspecified: Secondary | ICD-10-CM | POA: Diagnosis present

## 2021-03-16 DIAGNOSIS — M8508 Fibrous dysplasia (monostotic), other site: Secondary | ICD-10-CM | POA: Diagnosis not present

## 2021-03-16 DIAGNOSIS — Z79899 Other long term (current) drug therapy: Secondary | ICD-10-CM

## 2021-03-16 DIAGNOSIS — D167 Benign neoplasm of ribs, sternum and clavicle: Principal | ICD-10-CM | POA: Diagnosis present

## 2021-03-16 DIAGNOSIS — R222 Localized swelling, mass and lump, trunk: Secondary | ICD-10-CM | POA: Diagnosis present

## 2021-03-16 DIAGNOSIS — E739 Lactose intolerance, unspecified: Secondary | ICD-10-CM | POA: Diagnosis present

## 2021-03-16 DIAGNOSIS — D62 Acute posthemorrhagic anemia: Secondary | ICD-10-CM | POA: Diagnosis not present

## 2021-03-16 DIAGNOSIS — J45909 Unspecified asthma, uncomplicated: Secondary | ICD-10-CM | POA: Diagnosis present

## 2021-03-16 DIAGNOSIS — Z7984 Long term (current) use of oral hypoglycemic drugs: Secondary | ICD-10-CM

## 2021-03-16 DIAGNOSIS — E7800 Pure hypercholesterolemia, unspecified: Secondary | ICD-10-CM | POA: Diagnosis present

## 2021-03-16 HISTORY — PX: RIB RESECTION: SHX5077

## 2021-03-16 LAB — GLUCOSE, CAPILLARY
Glucose-Capillary: 99 mg/dL (ref 70–99)
Glucose-Capillary: 110 mg/dL — ABNORMAL HIGH (ref 70–99)
Glucose-Capillary: 115 mg/dL — ABNORMAL HIGH (ref 70–99)

## 2021-03-16 LAB — POCT PREGNANCY, URINE: Preg Test, Ur: NEGATIVE

## 2021-03-16 SURGERY — EXCISION, RIB
Anesthesia: General | Site: Chest | Laterality: Left

## 2021-03-16 MED ORDER — COMPLETENATE 29-1 MG PO CHEW
1.0000 | CHEWABLE_TABLET | Freq: Every day | ORAL | Status: DC
  Administered 2021-03-17 – 2021-03-19 (×3): 1 via ORAL
  Filled 2021-03-16 (×4): qty 1

## 2021-03-16 MED ORDER — ROCURONIUM BROMIDE 10 MG/ML (PF) SYRINGE
PREFILLED_SYRINGE | INTRAVENOUS | Status: AC
  Filled 2021-03-16: qty 10

## 2021-03-16 MED ORDER — ONDANSETRON HCL 4 MG/2ML IJ SOLN
INTRAMUSCULAR | Status: AC
  Filled 2021-03-16: qty 2

## 2021-03-16 MED ORDER — DEXAMETHASONE SODIUM PHOSPHATE 10 MG/ML IJ SOLN
INTRAMUSCULAR | Status: DC | PRN
Start: 2021-03-16 — End: 2021-03-16
  Administered 2021-03-16: 5 mg via INTRAVENOUS

## 2021-03-16 MED ORDER — INSULIN ASPART 100 UNIT/ML IJ SOLN
0.0000 [IU] | Freq: Four times a day (QID) | INTRAMUSCULAR | Status: DC
  Administered 2021-03-17 – 2021-03-20 (×5): 2 [IU] via SUBCUTANEOUS

## 2021-03-16 MED ORDER — SODIUM CHLORIDE (PF) 0.9 % IJ SOLN
OROMUCOSAL | Status: DC | PRN
  Administered 2021-03-16: 4 mL via TOPICAL

## 2021-03-16 MED ORDER — LIDOCAINE 2% (20 MG/ML) 5 ML SYRINGE
INTRAMUSCULAR | Status: DC | PRN
Start: 2021-03-16 — End: 2021-03-16
  Administered 2021-03-16: 60 mg via INTRAVENOUS

## 2021-03-16 MED ORDER — ONDANSETRON HCL 4 MG/2ML IJ SOLN
INTRAMUSCULAR | Status: DC | PRN
  Administered 2021-03-16: 4 mg via INTRAVENOUS

## 2021-03-16 MED ORDER — HYDROCHLOROTHIAZIDE 12.5 MG PO TABS
6.2500 mg | ORAL_TABLET | Freq: Every day | ORAL | Status: DC
  Administered 2021-03-17 – 2021-03-20 (×4): 6.25 mg via ORAL
  Filled 2021-03-16 (×4): qty 1

## 2021-03-16 MED ORDER — ORAL CARE MOUTH RINSE: 15.0000 mL | Freq: Once | OROMUCOSAL | Status: AC

## 2021-03-16 MED ORDER — POTASSIUM CHLORIDE IN NACL 20-0.9 MEQ/L-% IV SOLN
INTRAVENOUS | Status: DC
  Filled 2021-03-16 (×5): qty 1000

## 2021-03-16 MED ORDER — PHENYLEPHRINE 40 MCG/ML (10ML) SYRINGE FOR IV PUSH (FOR BLOOD PRESSURE SUPPORT)
PREFILLED_SYRINGE | INTRAVENOUS | Status: DC | PRN
  Administered 2021-03-16: 80 ug via INTRAVENOUS

## 2021-03-16 MED ORDER — FENTANYL CITRATE (PF) 100 MCG/2ML IJ SOLN: 25.0000 ug | INTRAMUSCULAR | Status: DC | PRN

## 2021-03-16 MED ORDER — ACETAMINOPHEN 500 MG PO TABS
1000.0000 mg | ORAL_TABLET | Freq: Once | ORAL | Status: AC
  Administered 2021-03-16: 1000 mg via ORAL
  Filled 2021-03-16: qty 2

## 2021-03-16 MED ORDER — BUPIVACAINE HCL (PF) 0.5 % IJ SOLN
INTRAMUSCULAR | Status: AC
  Filled 2021-03-16: qty 30

## 2021-03-16 MED ORDER — PHENYLEPHRINE HCL-NACL 20-0.9 MG/250ML-% IV SOLN
INTRAVENOUS | Status: DC | PRN
  Administered 2021-03-16: 40 ug/min via INTRAVENOUS

## 2021-03-16 MED ORDER — CHLORHEXIDINE GLUCONATE CLOTH 2 % EX PADS
6.0000 | MEDICATED_PAD | Freq: Every day | CUTANEOUS | Status: DC
  Administered 2021-03-17: 6 via TOPICAL

## 2021-03-16 MED ORDER — KETOROLAC TROMETHAMINE 15 MG/ML IJ SOLN
15.0000 mg | Freq: Four times a day (QID) | INTRAMUSCULAR | Status: DC
  Administered 2021-03-16 – 2021-03-17 (×4): 15 mg via INTRAVENOUS
  Filled 2021-03-16 (×3): qty 1

## 2021-03-16 MED ORDER — ONDANSETRON HCL 4 MG/2ML IJ SOLN
4.0000 mg | Freq: Four times a day (QID) | INTRAMUSCULAR | Status: DC | PRN
  Administered 2021-03-18 – 2021-03-19 (×2): 4 mg via INTRAVENOUS
  Filled 2021-03-16 (×2): qty 2

## 2021-03-16 MED ORDER — 0.9 % SODIUM CHLORIDE (POUR BTL) OPTIME
TOPICAL | Status: DC | PRN
  Administered 2021-03-16: 5000 mL

## 2021-03-16 MED ORDER — PRAVASTATIN SODIUM 40 MG PO TABS
40.0000 mg | ORAL_TABLET | ORAL | Status: DC
  Administered 2021-03-17 – 2021-03-20 (×2): 40 mg via ORAL
  Filled 2021-03-16: qty 1

## 2021-03-16 MED ORDER — PROPOFOL 10 MG/ML IV BOLUS
INTRAVENOUS | Status: DC | PRN
  Administered 2021-03-16: 140 mg via INTRAVENOUS

## 2021-03-16 MED ORDER — KETOROLAC TROMETHAMINE 15 MG/ML IJ SOLN
INTRAMUSCULAR | Status: AC
  Filled 2021-03-16: qty 1

## 2021-03-16 MED ORDER — BUPIVACAINE LIPOSOME 1.3 % IJ SUSP
INTRAMUSCULAR | Status: AC
  Filled 2021-03-16: qty 20

## 2021-03-16 MED ORDER — MIDAZOLAM HCL 2 MG/2ML IJ SOLN
INTRAMUSCULAR | Status: DC | PRN
  Administered 2021-03-16: 2 mg via INTRAVENOUS
  Administered 2021-03-16 (×2): 1 mg via INTRAVENOUS

## 2021-03-16 MED ORDER — MIDAZOLAM HCL 2 MG/2ML IJ SOLN
INTRAMUSCULAR | Status: AC
  Filled 2021-03-16: qty 2

## 2021-03-16 MED ORDER — HYDROMORPHONE HCL 1 MG/ML IJ SOLN
INTRAMUSCULAR | Status: DC | PRN
  Administered 2021-03-16: .5 mg via INTRAVENOUS

## 2021-03-16 MED ORDER — ONDANSETRON HCL 4 MG/2ML IJ SOLN: 4.0000 mg | Freq: Four times a day (QID) | INTRAMUSCULAR | Status: DC | PRN

## 2021-03-16 MED ORDER — ROCURONIUM BROMIDE 10 MG/ML (PF) SYRINGE
PREFILLED_SYRINGE | INTRAVENOUS | Status: DC | PRN
  Administered 2021-03-16 (×2): 30 mg via INTRAVENOUS
  Administered 2021-03-16: 70 mg via INTRAVENOUS

## 2021-03-16 MED ORDER — SUGAMMADEX SODIUM 200 MG/2ML IV SOLN
INTRAVENOUS | Status: DC | PRN
Start: 2021-03-16 — End: 2021-03-16
  Administered 2021-03-16: 300 mg via INTRAVENOUS

## 2021-03-16 MED ORDER — HYDROMORPHONE HCL 1 MG/ML IJ SOLN
INTRAMUSCULAR | Status: AC
  Filled 2021-03-16: qty 0.5

## 2021-03-16 MED ORDER — NALOXONE HCL 0.4 MG/ML IJ SOLN: 0.4000 mg | INTRAMUSCULAR | Status: DC | PRN

## 2021-03-16 MED ORDER — DIPHENHYDRAMINE HCL 12.5 MG/5ML PO ELIX: 12.5000 mg | ORAL_SOLUTION | Freq: Four times a day (QID) | ORAL | Status: DC | PRN

## 2021-03-16 MED ORDER — ALBUTEROL SULFATE (2.5 MG/3ML) 0.083% IN NEBU: 3.0000 mL | INHALATION_SOLUTION | Freq: Four times a day (QID) | RESPIRATORY_TRACT | Status: DC | PRN

## 2021-03-16 MED ORDER — DIPHENHYDRAMINE HCL 50 MG/ML IJ SOLN: 12.5000 mg | Freq: Four times a day (QID) | INTRAMUSCULAR | Status: DC | PRN

## 2021-03-16 MED ORDER — FENTANYL 50 MCG/ML IV PCA SOLN
INTRAVENOUS | Status: DC
  Administered 2021-03-16: 150 ug via INTRAVENOUS
  Administered 2021-03-16: 120 ug via INTRAVENOUS
  Administered 2021-03-17: 30 ug via INTRAVENOUS
  Administered 2021-03-17: 75 ug via INTRAVENOUS
  Administered 2021-03-18: 180 ug via INTRAVENOUS
  Administered 2021-03-18: 300 ug via INTRAVENOUS
  Administered 2021-03-18: 30 ug via INTRAVENOUS
  Administered 2021-03-19: 390 ug via INTRAVENOUS
  Administered 2021-03-19: 195 ug/h via INTRAVENOUS
  Administered 2021-03-19: 15 ug via INTRAVENOUS
  Administered 2021-03-19: 45 ug via INTRAVENOUS
  Administered 2021-03-20: 255 ug via INTRAVENOUS
  Administered 2021-03-20: 150 ug via INTRAVENOUS
  Filled 2021-03-16 (×2): qty 20

## 2021-03-16 MED ORDER — FENTANYL CITRATE (PF) 250 MCG/5ML IJ SOLN
INTRAMUSCULAR | Status: AC
  Filled 2021-03-16: qty 5

## 2021-03-16 MED ORDER — SODIUM CHLORIDE 0.9% FLUSH: 9.0000 mL | INTRAVENOUS | Status: DC | PRN

## 2021-03-16 MED ORDER — SENNOSIDES-DOCUSATE SODIUM 8.6-50 MG PO TABS
1.0000 | ORAL_TABLET | Freq: Every day | ORAL | Status: DC
  Administered 2021-03-16 – 2021-03-19 (×4): 1 via ORAL
  Filled 2021-03-16 (×4): qty 1

## 2021-03-16 MED ORDER — LACTATED RINGERS IV SOLN: INTRAVENOUS | Status: DC | PRN

## 2021-03-16 MED ORDER — BISACODYL 5 MG PO TBEC
10.0000 mg | DELAYED_RELEASE_TABLET | Freq: Every day | ORAL | Status: DC
  Administered 2021-03-17 – 2021-03-20 (×4): 10 mg via ORAL
  Filled 2021-03-16 (×4): qty 2

## 2021-03-16 MED ORDER — LIDOCAINE 2% (20 MG/ML) 5 ML SYRINGE
INTRAMUSCULAR | Status: AC
  Filled 2021-03-16: qty 5

## 2021-03-16 MED ORDER — PHENYLEPHRINE 40 MCG/ML (10ML) SYRINGE FOR IV PUSH (FOR BLOOD PRESSURE SUPPORT)
PREFILLED_SYRINGE | INTRAVENOUS | Status: AC
  Filled 2021-03-16: qty 10

## 2021-03-16 MED ORDER — CHLORHEXIDINE GLUCONATE 0.12 % MT SOLN
15.0000 mL | Freq: Once | OROMUCOSAL | Status: AC
  Administered 2021-03-16: 15 mL via OROMUCOSAL
  Filled 2021-03-16: qty 15

## 2021-03-16 MED ORDER — LACTATED RINGERS IV SOLN: INTRAVENOUS | Status: DC

## 2021-03-16 MED ORDER — DEXAMETHASONE SODIUM PHOSPHATE 10 MG/ML IJ SOLN
INTRAMUSCULAR | Status: AC
  Filled 2021-03-16: qty 1

## 2021-03-16 MED ORDER — ENOXAPARIN SODIUM 40 MG/0.4ML IJ SOSY
40.0000 mg | PREFILLED_SYRINGE | Freq: Every day | INTRAMUSCULAR | Status: DC
  Administered 2021-03-18 – 2021-03-20 (×3): 40 mg via SUBCUTANEOUS
  Filled 2021-03-16 (×3): qty 0.4

## 2021-03-16 MED ORDER — PROPOFOL 10 MG/ML IV BOLUS
INTRAVENOUS | Status: AC
  Filled 2021-03-16: qty 20

## 2021-03-16 MED ORDER — OXYCODONE HCL 5 MG PO TABS
5.0000 mg | ORAL_TABLET | ORAL | Status: DC | PRN
  Administered 2021-03-16 – 2021-03-20 (×5): 10 mg via ORAL
  Filled 2021-03-16 (×5): qty 2

## 2021-03-16 MED ORDER — INSULIN ASPART 100 UNIT/ML IJ SOLN: 0.0000 [IU] | Freq: Every day | INTRAMUSCULAR | Status: DC

## 2021-03-16 MED ORDER — BUPIVACAINE LIPOSOME 1.3 % IJ SUSP
INTRAMUSCULAR | Status: DC | PRN
  Administered 2021-03-16: 40 mL

## 2021-03-16 MED ORDER — FENTANYL CITRATE (PF) 250 MCG/5ML IJ SOLN
INTRAMUSCULAR | Status: DC | PRN
  Administered 2021-03-16 (×5): 50 ug via INTRAVENOUS
  Administered 2021-03-16: 100 ug via INTRAVENOUS

## 2021-03-16 MED ORDER — INSULIN ASPART 100 UNIT/ML IJ SOLN: 0.0000 [IU] | Freq: Three times a day (TID) | INTRAMUSCULAR | Status: DC

## 2021-03-16 SURGICAL SUPPLY — 86 items
ADH SKN CLS APL DERMABOND .7 (GAUZE/BANDAGES/DRESSINGS)
APPLIER CLIP ROT 10 11.4 M/L (STAPLE)
APR CLP MED LRG 11.4X10 (STAPLE)
BIT DRILL 7/64X5 DISP (BIT) IMPLANT
BLADE CLIPPER SURG (BLADE) ×2 IMPLANT
BLADE LONG MED 31X9 (MISCELLANEOUS) ×1 IMPLANT
CANISTER SUCT 3000ML PPV (MISCELLANEOUS) ×4 IMPLANT
CATH THORACIC 28FR (CATHETERS) IMPLANT
CATH THORACIC 28FR RT ANG (CATHETERS) IMPLANT
CATH THORACIC 36FR (CATHETERS) IMPLANT
CATH THORACIC 36FR RT ANG (CATHETERS) IMPLANT
CLIP APPLIE ROT 10 11.4 M/L (STAPLE) IMPLANT
CLIP TI WIDE RED SMALL 24 (CLIP) ×2 IMPLANT
CLIP VESOCCLUDE MED 6/CT (CLIP) ×2 IMPLANT
CNTNR URN SCR LID CUP LEK RST (MISCELLANEOUS) IMPLANT
CONN ST 1/4X3/8  BEN (MISCELLANEOUS)
CONN ST 1/4X3/8 BEN (MISCELLANEOUS) IMPLANT
CONNECTOR BLAKE 2:1 CARIO BLK (MISCELLANEOUS) ×1 IMPLANT
CONT SPEC 4OZ STRL OR WHT (MISCELLANEOUS) ×4
DERMABOND ADVANCED (GAUZE/BANDAGES/DRESSINGS)
DERMABOND ADVANCED .7 DNX12 (GAUZE/BANDAGES/DRESSINGS) IMPLANT
DRAIN CONNECTOR BLAKE 1:1 (MISCELLANEOUS) ×1 IMPLANT
DRSG AQUACEL AG ADV 3.5X14 (GAUZE/BANDAGES/DRESSINGS) ×1 IMPLANT
ELECT BLADE 4.0 EZ CLEAN MEGAD (MISCELLANEOUS) ×2
ELECT BLADE 6.5 EXT (BLADE) ×2 IMPLANT
ELECT REM PT RETURN 9FT ADLT (ELECTROSURGICAL) ×2
ELECTRODE BLDE 4.0 EZ CLN MEGD (MISCELLANEOUS) IMPLANT
ELECTRODE REM PT RTRN 9FT ADLT (ELECTROSURGICAL) ×1 IMPLANT
EVACUATOR SILICONE 100CC (DRAIN) ×1 IMPLANT
FELT TEFLON 1X6 (MISCELLANEOUS) ×2 IMPLANT
GAUZE 4X4 16PLY ~~LOC~~+RFID DBL (SPONGE) ×2 IMPLANT
GAUZE SPONGE 4X4 12PLY STRL (GAUZE/BANDAGES/DRESSINGS) ×2 IMPLANT
GLOVE SURG SIGNA 7.5 PF LTX (GLOVE) ×6 IMPLANT
GLOVE SURG UNDER POLY LF SZ6 (GLOVE) ×1 IMPLANT
GOWN STRL REUS W/ TWL LRG LVL3 (GOWN DISPOSABLE) ×2 IMPLANT
GOWN STRL REUS W/ TWL XL LVL3 (GOWN DISPOSABLE) ×1 IMPLANT
GOWN STRL REUS W/TWL LRG LVL3 (GOWN DISPOSABLE) ×4
GOWN STRL REUS W/TWL XL LVL3 (GOWN DISPOSABLE) ×2
INSERT FOGARTY 61MM (MISCELLANEOUS) IMPLANT
KIT BASIN OR (CUSTOM PROCEDURE TRAY) ×2 IMPLANT
KIT SUCTION CATH 14FR (SUCTIONS) ×2 IMPLANT
KIT TURNOVER KIT B (KITS) ×2 IMPLANT
MESH VENTRALIGHT ST 7X9N (Mesh General) ×1 IMPLANT
NS IRRIG 1000ML POUR BTL (IV SOLUTION) ×6 IMPLANT
PACK CHEST (CUSTOM PROCEDURE TRAY) ×2 IMPLANT
PACK UNIVERSAL I (CUSTOM PROCEDURE TRAY) ×1 IMPLANT
PAD ARMBOARD 7.5X6 YLW CONV (MISCELLANEOUS) ×4 IMPLANT
PASSER SUT SWANSON 36MM LOOP (INSTRUMENTS) ×1 IMPLANT
PENCIL BUTTON HOLSTER BLD 10FT (ELECTRODE) ×1 IMPLANT
SEALANT SURG COSEAL 8ML (VASCULAR PRODUCTS) IMPLANT
SPONGE T-LAP 18X18 ~~LOC~~+RFID (SPONGE) ×8 IMPLANT
SPONGE T-LAP 4X18 ~~LOC~~+RFID (SPONGE) ×2 IMPLANT
SPONGE TONSIL TAPE 1 RFD (DISPOSABLE) ×2 IMPLANT
STAPLER VISISTAT 35W (STAPLE) IMPLANT
STOPCOCK 4 WAY LG BORE MALE ST (IV SETS) ×2 IMPLANT
SUT PDS AB 1 CTX 36 (SUTURE) ×4 IMPLANT
SUT PDS AB 2-0 CT1 27 (SUTURE) ×1 IMPLANT
SUT PROLENE 0 CT (SUTURE) ×23 IMPLANT
SUT PROLENE 0 SH 30 (SUTURE) ×1 IMPLANT
SUT PROLENE 2 0 MH 48 (SUTURE) IMPLANT
SUT PROLENE 2 0 SH DA (SUTURE) IMPLANT
SUT PROLENE 3 0 SH 48 (SUTURE) IMPLANT
SUT PROLENE 4 0 RB 1 (SUTURE) ×2
SUT PROLENE 4 0 SH DA (SUTURE) IMPLANT
SUT PROLENE 4-0 RB1 .5 CRCL 36 (SUTURE) ×1 IMPLANT
SUT SILK  1 MH (SUTURE) ×4
SUT SILK 1 MH (SUTURE) ×2 IMPLANT
SUT SILK 2 0SH CR/8 30 (SUTURE) ×2 IMPLANT
SUT VIC AB 1 CTX 18 (SUTURE) IMPLANT
SUT VIC AB 1 CTX 36 (SUTURE) ×4
SUT VIC AB 1 CTX36XBRD ANBCTR (SUTURE) ×1 IMPLANT
SUT VIC AB 2-0 CTX 27 (SUTURE) ×1 IMPLANT
SUT VIC AB 3-0 X1 27 (SUTURE) ×3 IMPLANT
SUT VICRYL 2 TP 1 (SUTURE) ×2 IMPLANT
SWAB CULTURE ESWAB REG 1ML (MISCELLANEOUS) IMPLANT
SYR 10ML LL (SYRINGE) ×2 IMPLANT
SYR 20ML ECCENTRIC (SYRINGE) ×2 IMPLANT
SYR 20ML LL LF (SYRINGE) IMPLANT
SYR 50ML LL SCALE MARK (SYRINGE) ×2 IMPLANT
SYSTEM SAHARA CHEST DRAIN ATS (WOUND CARE) ×2 IMPLANT
TAPE CLOTH SURG 4X10 WHT LF (GAUZE/BANDAGES/DRESSINGS) ×1 IMPLANT
TOWEL GREEN STERILE (TOWEL DISPOSABLE) ×4 IMPLANT
TRAY FOLEY SLVR 16FR LF STAT (SET/KITS/TRAYS/PACK) ×2 IMPLANT
TUBING EXTENTION W/L.L. (IV SETS) IMPLANT
WATER STERILE IRR 1000ML POUR (IV SOLUTION) ×2 IMPLANT
YANKAUER SUCT BULB TIP NO VENT (SUCTIONS) ×1 IMPLANT

## 2021-03-16 NOTE — Brief Op Note (Signed)
03/16/2021  11:15 AM  PATIENT:  Breanna Malone  41 y.o. 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 Frozen margin negative for cancer  COUNTS CORRECT:  YES  DICTATION: .Dragon Dictation  PLAN OF CARE: Admit to inpatient   PATIENT DISPOSITION:  PACU - hemodynamically stable.   Delay start of Pharmacological VTE agent (>24hrs) due to surgical blood loss or risk of bleeding: no

## 2021-03-16 NOTE — Hospital Course (Addendum)
HPI: [name] with a past medical history significant for hypertension, hyperlipidemia, type II diabetes, bipolar disorder, asthma, arthritis, and a rib mass.  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  In February she was having some flank pain.  CT of the abdomen and pelvis showed an expansile left ninth rib lesion that has increased in size in comparison through a film in 2011.  She was having pain in that region again in September.  She was referred to Dr. Constance Malone of general surgery.  She saw her in November.  She ordered an MR, which showed 11.3 x 4.9 x 4.6 cm expansile lesion in the left ninth rib.  There was possible enlarging soft tissue component measuring 4.3 x 2.3 cm which had increased in comparison to the CT from 9 months prior.   She continues to have pain in that area.  She says she has a bit of pillow behind that area when she drives.  There are certain movements with resultant pain.  She does not have pain at rest.   She was seen in the emergency room last week with calf pain and swelling.  Ultrasound was negative for DVT.  Dr. Roxan Malone reviewed the images with Breanna Malone.  We discussed that this likely is benign although we cannot rule out malignant transformation.  We can attempt to biopsy the lesion, but given its size there is no way we could know for sure there was not bleeding transformation in some part of that.  Given that it is causing her discomfort, Dr. Roxan Malone thought the best option would be to surgically resect the lesion.  Potential risks, benefits, and complications of the surgery were discussed with the patient and she agreed to proceed with surgery.  Hospital Course: Patient underwent a left thoracotomy and resection of  tumor of the left ninth rib and repair of defect with mesh. She was extubated and transferred from the OR to PACU in stable condition. She had 2 Keenan Bachelor drains;1 is to  bulb suction (anterior) and the other is to a Pleura Vac (posterior) and to water seal. Posterior chest tube was removed on 01/27. Follow up chest x ray showed no evidence of pneumothorax.  She has been tolerating a diet. She was restarted on her Metformin as she has a history of diabetes mellitus. She was restarted on HCTZ as well.  Her blood pressure continued to increase and her home Lisinopril was resumed.  Her JP output remained low but because it still had output, will remain. HH has been arranged to help with care of JP drain. Regarding pain control, PCA was stooped and Gabapaentin was increased to 300 mg tid. She was also given IV Toradol and Oxy PRN. As discussed with Dr. Roxan Malone, patient is felt surgically stable for discharge today.

## 2021-03-16 NOTE — Interval H&P Note (Signed)
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  The various methods of treatment have been discussed with the patient and family. After consideration of risks, benefits and other options for treatment, the patient has consented to  Procedure(s): RESECTION OF TUMOR LEFT 9TH RIB (Left) as a surgical intervention.  The patient's history has been reviewed, patient examined, no change in status, stable for surgery.  I have reviewed the patient's chart and labs.  Questions were answered to the patient's satisfaction.     Melrose Nakayama

## 2021-03-16 NOTE — Transfer of Care (Signed)
Immediate Anesthesia Transfer of Care Note  Patient: Breanna Malone  Procedure(s) Performed: RESECTION OF TUMOR LEFT NINTH RIB (Left: Chest)  Patient Location: PACU  Anesthesia Type:General  Level of Consciousness: drowsy and patient cooperative  Airway & Oxygen Therapy: Patient Spontanous Breathing  Post-op Assessment: Report given to RN, Post -op Vital signs reviewed and stable and Patient moving all extremities  Post vital signs: Reviewed and stable  Last Vitals:  Vitals Value Taken Time  BP    Temp    Pulse    Resp    SpO2      Last Pain:  Vitals:   03/16/21 6861  TempSrc:   PainSc: 0-No pain         Complications: No notable events documented.

## 2021-03-16 NOTE — TOC Progression Note (Signed)
Transition of Care [facility]) - Progression Note    Patient Details  Name: Breanna Malone MRN: [id number] Date of Birth: Feb 04, 1981  Transition of Care [facility]) CM/SW [place], RN Phone [phone]  03/16/2021, 4:03 PM  Clinical Narrative:     Transition of Care [facility]) Screening Note   Patient Details  Name: Breanna Malone Date of Birth: 1980/12/22   Transition of Care [facility]) CM/SW Contact:    Angelita Ingles, RN Phone Number: 03/16/2021, 4:03 PM    Transition of Care Department [facility]) has reviewed patient and no TOC needs have been identified at this time. We will continue to monitor patient advancement through interdisciplinary progression rounds. If new patient transition needs arise, please place a TOC consult.          Expected Discharge Plan and Services                                                 Social Determinants of Health (SDOH) Interventions    Readmission Risk Interventions No flowsheet data found.

## 2021-03-16 NOTE — Anesthesia Preprocedure Evaluation (Signed)
Anesthesia Evaluation  Patient identified by MRN, date of birth, ID band Patient awake    Reviewed: Allergy & Precautions, H&P , NPO status , Patient's Chart, lab work & pertinent test results  Airway Mallampati: II   Neck ROM: full    Dental   Pulmonary asthma ,  Left 9th rib mass 11x5x5cm. Located on psterior lateral aspect of chest.   breath sounds clear to auscultation       Cardiovascular hypertension,  Rhythm:regular Rate:Normal     Neuro/Psych PSYCHIATRIC DISORDERS Anxiety Bipolar Disorder    GI/Hepatic   Endo/Other  diabetes, Type 2Morbid obesity  Renal/GU      Musculoskeletal  (+) Arthritis ,   Abdominal   Peds  Hematology   Anesthesia Other Findings   Reproductive/Obstetrics                             Anesthesia Physical Anesthesia Plan  ASA: 2  Anesthesia Plan: General   Post-op Pain Management:    Induction: Intravenous  PONV Risk Score and Plan: 3 and Ondansetron, Dexamethasone, Midazolam and Treatment may vary due to age or medical condition  Airway Management Planned: Oral ETT  Additional Equipment: Arterial line  Intra-op Plan:   Post-operative Plan: Extubation in OR  Informed Consent: I have reviewed the patients History and Physical, chart, labs and discussed the procedure including the risks, benefits and alternatives for the proposed anesthesia with the patient or authorized representative who has indicated his/her understanding and acceptance.     Dental advisory given  Plan Discussed with: CRNA, Anesthesiologist and Surgeon  Anesthesia Plan Comments:         Anesthesia Quick Evaluation

## 2021-03-16 NOTE — Anesthesia Procedure Notes (Signed)
Procedure Name: Intubation Date/Time: 03/16/2021 8:10 AM Performed by: Amadeo Garnet, CRNA Pre-anesthesia Checklist: Patient identified, Emergency Drugs available, Suction available and Patient being monitored Patient Re-evaluated:Patient Re-evaluated prior to induction Oxygen Delivery Method: Circle system utilized Preoxygenation: Pre-oxygenation with 100% oxygen Induction Type: IV induction Ventilation: Mask ventilation without difficulty Laryngoscope Size: Mac and 4 Grade View: Grade I Tube type: Oral Endobronchial tube: Left, EBT position confirmed by fiberoptic bronchoscope and Double lumen EBT and 37 Fr Number of attempts: 1 Airway Equipment and Method: Stylet and Oral airway Placement Confirmation: ETT inserted through vocal cords under direct vision, positive ETCO2 and breath sounds checked- equal and bilateral Secured at: 27 cm Tube secured with: Tape Dental Injury: Teeth and Oropharynx as per pre-operative assessment

## 2021-03-16 NOTE — Plan of Care (Signed)
°  Problem: Education: °Goal: Knowledge of disease or condition will improve °Outcome: Progressing °Goal: Knowledge of the prescribed therapeutic regimen will improve °Outcome: Progressing °  °Problem: Activity: °Goal: Risk for activity intolerance will decrease °Outcome: Progressing °  °Problem: Cardiac: °Goal: Will achieve and/or maintain hemodynamic stability °Outcome: Progressing °  °Problem: Clinical Measurements: °Goal: Postoperative complications will be avoided or minimized °Outcome: Progressing °  °Problem: Respiratory: °Goal: Respiratory status will improve °Outcome: Progressing °  °Problem: Pain Management: °Goal: Pain level will decrease °Outcome: Progressing °  °Problem: Skin Integrity: °Goal: Wound healing without signs and symptoms infection will improve °Outcome: Progressing °  °

## 2021-03-16 NOTE — Discharge Instructions (Signed)
Thoracotomy, Care After This sheet gives you information about how to care for yourself after your procedure. Your doctor may also give you more specific instructions. If you have problems or questions, contact your doctor. What can I expect after the procedure? After the procedure, it is common to have: Redness, pain, and swelling around the cut from surgery (incision). Fluid or blood coming from the cut from surgery. Pain when you breathe in. Trouble pooping (constipation). Being tired. Lack of interest in eating and drinking. Trouble sleeping. Follow these instructions at home: Preventing lung infection Take deep breaths or do breathing exercises to prevent lung infection (pneumonia) as told by your doctor. Cough often. Coughing helps to clear mucus and open your lungs. If coughing hurts, hold a pillow against your chest or place both hands flat on top of your cut when you cough. This may help with discomfort. Use an incentive spirometer as told by your doctor. This is a tool that measures how well you fill your lungs with each breath. Do lung therapy (pulmonary rehabilitation) as told by your doctor. Medicines Take over-the-counter and prescription medicines only as told by your doctor. If you have pain, take medicine to help the pain before it gets very bad. This will help you breathe and cough more easily. If you were prescribed an antibiotic medicine, take it as told by your doctor. Do not stop taking the antibiotic even if you start to feel better. Ask your doctor if the medicine prescribed to you: Requires you to avoid driving or using machinery. Can cause trouble pooping. You may need to take these actions to prevent or treat trouble pooping: Drink enough fluid to keep your pee (urine) pale yellow. Take over-the-counter or prescription medicines. Eat foods that are high in fiber. These include beans, whole grains, and fresh fruits and vegetables. Limit foods that are high in fat  and sugar. These include fried or sweet foods. Activity Rest as told by your doctor. Ask your doctor what activities are safe for you. Do not travel by airplane for 2 weeks after your chest tube is removed, or until your doctor says that this is safe. Do not lift anything that is heavier than 10 lb (4.5 kg), or the limit that you are told, until your doctor says that it is safe. Incision care Follow instructions from your doctor about how to take care of your cut from surgery. Make sure you: Wash your hands with soap and water for at least 20 seconds before and after you change your bandage (dressing). If you cannot use soap and water, use hand sanitizer. Change your bandage as told by your doctor. Leave stitches (sutures), skin glue, or skin tape (adhesive) strips in place. They may need to stay in place for 2 weeks or longer. If tape strips get loose and curl up, you may trim the loose edges. Do not remove tape strips completely unless your doctor says it is okay. Keep your bandage dry. Check the area around your cut from surgery every day for signs of infection. Check for: More redness, swelling, or pain. More fluid or blood. Warmth. Pus or a bad smell. After your bandage has been taken off, use soap and water to gently wash your cut from surgery. Do not use anything else to clean your cut unless your doctor tells you to. Lifestyle Do not take baths, swim, or use a hot tub until your doctor approves. Ask your doctor if you may take showers. You may only be  allowed to take sponge baths. Do not use any products that contain nicotine or tobacco, such as cigarettes, e-cigarettes, and chewing tobacco. If you need help quitting, ask your doctor. Avoid secondhand smoke. Eat a healthy diet as told by your doctor. A healthy diet includes: Fresh fruits and vegetables. Whole grains. Low-fat (lean) proteins. General instructions Wear compression stockings as told by your doctor. If you have a chest  tube, care for it as told by your doctor. Keep all follow-up visits as told by your doctor. This is important. Contact a doctor if: You have any signs of infection around your cut from surgery, such as: More redness, swelling, or pain. More fluid or blood. Warmth. Pus or a bad smell. You have a fever or chills. Your heartbeat seems uneven. You vomit or feel like vomiting. You have pain in your muscles or you feel weak. You feel like you are about to faint. You have very bad pain or pain that gets worse even when you take medicine. Get help right away if: You get a rash. You have trouble breathing. You develop signs of a blood clot, such as: You have trouble talking. You are confused. You cannot see well. You are not able to move. You lose feeling in your face, arms, or legs (feel numb). You faint. You have a very bad headache all of a sudden. You have chest pain. These symptoms may be an emergency. Do not wait to see if the symptoms will go away. Get medical help right away. Call your local emergency services (911 in the [place] Do not drive yourself to the hospital. Summary After the procedure, it is common to have pain, redness, fluid, or blood around the area that was cut. Take deep breaths, do breathing exercises, and cough often. This helps prevent lung infection. Do not travel by airplane for 2 weeks after your chest tube is removed, or until your doctor says that this is safe. Check the area around your cut from surgery every day for signs of infection. Eat a healthy diet. This includes fresh fruits and vegetables, whole grains, and low-fat (lean) proteins. This information is not intended to replace advice given to you by your health care provider. Make sure you discuss any questions you have with your health care provider. Document Revised: 11/18/2018 Document Reviewed: 11/18/2018 Elsevier Patient Education  [place].

## 2021-03-16 NOTE — Discharge Summary (Signed)
Physician Discharge Summary       [address]       [place] [address]             [phone]    Patient ID: Breanna Malone MRN: [id number] DOB/AGE: [birth year] 41 y.o.  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  History of the following: Abnormal Pap smear      Arthritis     Asthma     Asthma     Bipolar 1 disorder (HCC)     Bone spur      rib   Bronchitis     BV (bacterial vaginosis) 10/08/2019    8/19 rx flagyl   Carpal tunnel syndrome     Cervical spine pain     Chest pain     Diabetes mellitus without complication ([place])     Hyperlipidemia     Hyperlipoproteinemia     Hypertension     Post traumatic stress disorder     Right ovarian cyst 10/31/2012    Complex right ovarian cyst seen 8/18 in ER need f/u US   Tendonitis     Vaginal Pap smear, abnormal       Consults: None  Procedure (s):  Resection of chest wall mass with 9th rib resection and repair of defect using mesh by Dr. Roxan Hockey on 03/16/2021.  Pathology:Final result is pending  HPI: [name] with a past medical history significant for hypertension, hyperlipidemia, type II diabetes, bipolar disorder, asthma, arthritis, and a rib mass.  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  In February she was having some flank pain.  CT of the abdomen and pelvis showed an expansile left ninth rib lesion that has increased in size in comparison through a film in 2011.  She was having pain in that region again in September.  She was referred to Dr. Constance Malone of general surgery.  She saw her in November.  She ordered an MR, which showed 11.3 x 4.9 x 4.6 cm expansile lesion in the left ninth rib.  There was possible enlarging soft tissue component measuring  4.3 x 2.3 cm which had increased in comparison to the CT from 9 months prior.   She continues to have pain in that area.  She says she has a bit of pillow behind that area when she drives.  There are certain movements with resultant pain.  She does not have pain at rest.   She was seen in the emergency room last week with calf pain and swelling.  Ultrasound was negative for DVT.  Dr. Roxan Hockey reviewed the images with Breanna Malone.  We discussed that this likely is benign although we cannot rule out malignant transformation.  We can attempt to biopsy the lesion, but given its size there is no way we could know for sure there was not bleeding transformation in some part of that.  Given that it is causing her discomfort, Dr. Roxan Hockey thought the best option would be to surgically resect the lesion.  Potential risks, benefits, and complications of the surgery were discussed with the patient and she agreed to proceed with surgery.  Hospital Course: Patient underwent a left thoracotomy and resection of  tumor of the left ninth  rib and repair of defect with mesh. She was extubated and transferred from the OR to PACU in stable condition. She had 2 Keenan Bachelor drains;1 is to bulb suction (anterior) and the other is to a Pleura Vac (posterior) and to water seal. Posterior chest tube was removed on 01/27. Follow up chest x ray showed no evidence of pneumothorax.  She has been tolerating a diet. She was restarted on her Metformin as she has a history of diabetes mellitus. She was restarted on HCTZ as well.  Her blood pressure continued to increase and her home Lisinopril was resumed.  Her JP output remained low but because it still had output, will remain. HH has been arranged to help with care of JP drain. Regarding pain control, PCA was stooped and Gabapaentin was increased to 300 mg tid. She was also given IV Toradol and Oxy PRN. As discussed with Dr. Roxan Hockey, patient is felt surgically stable for discharge  today.    Latest Vital Signs: Blood pressure 118/77, pulse (!) 101, temperature 98.4 F (36.9 C), temperature source Oral, resp. rate 20, height 5\' 4"  (1.626 m), weight 104.3 kg, last menstrual period 03/13/2021, SpO2 91 %.  Physical Exam: Cardiovascular: RRR Pulmonary: Clear to auscultation bilaterally Abdomen: Soft, non tender, bowel sounds present. Wounds: Aquacel removed and wound is clean and dry.  No erythema or signs of infection. Chest Tube: Blake drain to bulb suction;sero sanguinous drainage (scant in drain this am)  Discharge Condition:Stable and discharged to home.  Recent laboratory studies:  Lab Results  Component Value Date   WBC 12.7 (H) 03/18/2021   HGB 9.9 (L) 03/18/2021   HCT 31.9 (L) 03/18/2021   MCV 97.3 03/18/2021   PLT 277 03/18/2021   Lab Results  Component Value Date   NA 137 03/18/2021   K 3.9 03/18/2021   CL 106 03/18/2021   CO2 24 03/18/2021   CREATININE 0.72 03/18/2021   GLUCOSE 132 (H) 03/18/2021      Diagnostic Studies: DG Chest 2 View  Result Date: 03/18/2021 CLINICAL DATA:  PTX. Status post rib resection. Hx of asthma, dm, htn. Pt is a nonsmoker. EXAM: CHEST - 2 VIEW COMPARISON:  03/17/2021 FINDINGS: No pneumothorax. There is opacity at the left lung base, mostly retrocardiac the AP view, posterior on the lateral view, similar to the previous day's exam, likely atelectasis with pneumonia possible. Remainder of the lungs is clear. No convincing pleural effusion. Stable cardiac silhouette. IMPRESSION: 1. No pneumothorax. 2. Left lower lobe opacity most likely due to atelectasis. Pneumonia is possible. Electronically Signed   By: Lajean Manes M.D.   On: 03/18/2021 11:01   DG Chest 2 View  Result Date: 03/15/2021 CLINICAL DATA:  Preoperative evaluation for resection of a ninth left rib tumor. EXAM: CHEST - 2 VIEW COMPARISON:  March 17, 2018 FINDINGS: The heart size and mediastinal contours are within normal limits. Both lungs are clear. A 13.6  cm x 7.3 cm lobulated expansile ninth left rib lesion is again seen. No acute osseous abnormalities are identified. IMPRESSION: 1. No active cardiopulmonary disease. 2. Predominantly stable expansile ninth left rib lesion. Electronically Signed   By: Virgina Norfolk M.D.   On: 03/15/2021 07:54   US Venous Img Lower Unilateral Right  Result Date: 02/22/2021 CLINICAL DATA:  RIGHT lower extremity pain x2 weeks. EXAM: RIGHT LOWER EXTREMITY VENOUS DOPPLER ULTRASOUND TECHNIQUE: Gray-scale sonography with graded compression, as well as color Doppler and duplex ultrasound were performed to evaluate the lower extremity deep venous systems from  the level of the common femoral vein and including the common femoral, femoral, profunda femoral, popliteal and calf veins including the posterior tibial, peroneal and gastrocnemius veins when visible. The superficial great saphenous vein was also interrogated. Spectral Doppler was utilized to evaluate flow at rest and with distal augmentation maneuvers in the common femoral, femoral and popliteal veins. COMPARISON:  None. FINDINGS: Suboptimal evaluation, with poor acoustic penetration secondary to patient habitus. VENOUS Normal compressibility of the RIGHT common femoral, superficial femoral, and popliteal veins, as well as the visualized calf veins. Poor visualization of the RIGHT peroneal and anterior tibial veins. Visualized portions of profunda femoral vein and great saphenous vein unremarkable. No filling defects to suggest DVT on grayscale or color Doppler imaging. Doppler waveforms show normal direction of venous flow, normal respiratory plasticity and response to augmentation. Limited views of the contralateral common femoral vein are unremarkable. OTHER No evidence of superficial thrombophlebitis or abnormal fluid collection. Limitations: none IMPRESSION: Suboptimal evaluation, within these constraints; No evidence of femoropopliteal DVT within the RIGHT lower extremity.  Michaelle Birks, MD [facility] Radiology Electronically Signed   By: Michaelle Birks M.D.   On: 02/22/2021 13:45   DG Chest Port 1 View  Result Date: 03/17/2021 CLINICAL DATA:  Chest tube EXAM: PORTABLE CHEST 1 VIEW COMPARISON:  03/16/2021 FINDINGS: Left-sided chest tube directed towards the apex unchanged from the prior exam. No pneumothorax. Bilateral mild interstitial thickening. No focal consolidation. No pleural effusion. Stable cardiomediastinal silhouette. Expansile left posterolateral rib lesion partially visualized. IMPRESSION: Left sided chest tube in unchanged position.  No pneumothorax. Electronically Signed   By: Kathreen Devoid M.D.   On: 03/17/2021 08:50   DG Chest Port 1 View  Result Date: 03/16/2021 CLINICAL DATA:  Resection of expansile lesion in the left ninth rib EXAM: PORTABLE CHEST 1 VIEW COMPARISON:  03/14/2021 FINDINGS: There is interval resection of large expansile lesion in the posterior/posterolateral aspect of left ninth rib. Single left chest tube is noted with its tip in the left apex. There are no signs of pulmonary edema or focal pulmonary consolidation. There is no pleural effusion or pneumothorax. IMPRESSION: Interval resection of expansile lesion in the left ninth rib. There is no pleural effusion or pneumothorax. Left chest tube is noted. Electronically Signed   By: Elmer Picker M.D.   On: 03/16/2021 13:47   DG Chest Port 1V same Day  Result Date: 03/17/2021 CLINICAL DATA:  Pneumothorax EXAM: PORTABLE CHEST 1 VIEW COMPARISON:  Previous studies including the examination done earlier today FINDINGS: There is interval removal of left chest tube. There is no pneumothorax. There are linear densities in the lower lung fields, more so on the left side. There are no signs of pulmonary edema or focal pulmonary consolidation. There is no significant pleural effusion or pneumothorax. IMPRESSION: There are linear densities in the  lower lung fields, more so on the left side suggesting subsegmental atelectasis. There is interval removal of left chest tube. There is no pneumothorax. Electronically Signed   By: Elmer Picker M.D.   On: 03/17/2021 15:53     Discharge Medications: Allergies as of 03/20/2021       Reactions   Darvocet [propoxyphene N-acetaminophen] Itching   Hydrocodone-acetaminophen Itching   Lactose Intolerance (gi) Other (See Comments)   Causes stomach cramping.   Other Hives   Patient is allergic to corndogs.   Penicillins Hives, Itching   Has patient had a PCN reaction causing immediate rash, facial/tongue/throat swelling, SOB or lightheadedness  with hypotension:Yes Has patient had a PCN reaction causing severe rash involving mucus membranes or skin necrosis:No Has patient had a PCN reaction that required hospitalization:No Has patient had a PCN reaction occurring within the last 10 years:No If all of the above answers are "NO", then may proceed with Cephalosporin use.   Latex Itching, Rash   Percocet [oxycodone-acetaminophen] Itching, Rash        Medication List     STOP taking these medications    naproxen 500 MG tablet Commonly known as: Naprosyn       TAKE these medications    gabapentin 300 MG capsule Commonly known as: NEURONTIN Take 1 capsule (300 mg total) by mouth 3 (three) times daily.   hydrochlorothiazide 12.5 MG tablet Commonly known as: HYDRODIURIL Take 6.25 mg by mouth daily.   ibuprofen 200 MG tablet Commonly known as: ADVIL Take 400 mg by mouth every 6 (six) hours as needed for moderate pain or headache.   lisinopril 5 MG tablet Commonly known as: ZESTRIL Take 5 mg by mouth daily.   metFORMIN 500 MG tablet Commonly known as: GLUCOPHAGE Take 500 mg by mouth 2 (two) times daily with a meal.   oxyCODONE 5 MG immediate release tablet Commonly known as: Oxy IR/ROXICODONE Take 1 tablet (5 mg total) by mouth every 4 (four) hours as needed for severe  pain.   pravastatin 40 MG tablet Commonly known as: PRAVACHOL Take 40 mg by mouth 3 (three) times a week.   ProAir HFA 108 (90 Base) MCG/ACT inhaler Generic drug: albuterol Inhale 1-2 puffs into the lungs every 6 (six) hours as needed for wheezing.       Follow Up Appointments:  Follow-up Information     Melrose Nakayama, MD. Go on 04/04/2021.   Specialty: Cardiothoracic Surgery Why: PA/LAT CXR to be taken (at [facility] which is in the same building as Dr. Leonarda Salon office) on 02/14 at 11:15 amAppointment time is at 11:45 am Contact information: [place] [address] [phone]                 Signed: Sharalyn Ink [facility] 03/20/2021, 8:27 AM

## 2021-03-17 ENCOUNTER — Inpatient Hospital Stay (HOSPITAL_COMMUNITY): Payer: Medicaid Other

## 2021-03-17 ENCOUNTER — Encounter (HOSPITAL_COMMUNITY): Payer: Self-pay | Admitting: Thoracic Surgery (Cardiothoracic Vascular Surgery)

## 2021-03-17 LAB — BASIC METABOLIC PANEL
Anion gap: 6 (ref 5–15)
BUN: 9 mg/dL (ref 6–20)
CO2: 26 mmol/L (ref 22–32)
Calcium: 8.2 mg/dL — ABNORMAL LOW (ref 8.9–10.3)
Chloride: 102 mmol/L (ref 98–111)
Creatinine, Ser: 0.86 mg/dL (ref 0.44–1.00)
GFR, Estimated: >60 mL/min (ref >60)
Glucose, Bld: 156 mg/dL — ABNORMAL HIGH (ref 70–99)
Potassium: 3.7 mmol/L (ref 3.5–5.1)
Sodium: 134 mmol/L — ABNORMAL LOW (ref 135–145)

## 2021-03-17 LAB — CBC
HCT: 33 % — ABNORMAL LOW (ref 36.0–46.0)
Hemoglobin: 10.3 g/dL — ABNORMAL LOW (ref 12.0–15.0)
MCH: 30 pg (ref 26.0–34.0)
MCHC: 31.2 g/dL (ref 30.0–36.0)
MCV: 96.2 fL (ref 80.0–100.0)
Platelets: 313 10*3/uL (ref 150–400)
RBC: 3.43 MIL/uL — ABNORMAL LOW (ref 3.87–5.11)
RDW: 11.6 % (ref 11.5–15.5)
WBC: 12.4 10*3/uL — ABNORMAL HIGH (ref 4.0–10.5)
nRBC: 0 % (ref 0.0–0.2)

## 2021-03-17 LAB — GLUCOSE, CAPILLARY
Glucose-Capillary: 114 mg/dL — ABNORMAL HIGH (ref 70–99)
Glucose-Capillary: 116 mg/dL — ABNORMAL HIGH (ref 70–99)
Glucose-Capillary: 127 mg/dL — ABNORMAL HIGH (ref 70–99)
Glucose-Capillary: 140 mg/dL — ABNORMAL HIGH (ref 70–99)
Glucose-Capillary: 144 mg/dL — ABNORMAL HIGH (ref 70–99)

## 2021-03-17 MED ORDER — POTASSIUM CHLORIDE CRYS ER 20 MEQ PO TBCR
30.0000 meq | EXTENDED_RELEASE_TABLET | Freq: Once | ORAL | Status: AC
  Administered 2021-03-17: 30 meq via ORAL
  Filled 2021-03-17: qty 1

## 2021-03-17 MED ORDER — GABAPENTIN 300 MG PO CAPS
300.0000 mg | ORAL_CAPSULE | Freq: Two times a day (BID) | ORAL | Status: DC
  Administered 2021-03-17 – 2021-03-19 (×6): 300 mg via ORAL
  Filled 2021-03-17 (×6): qty 1

## 2021-03-17 MED ORDER — METFORMIN HCL ER 500 MG PO TB24
500.0000 mg | ORAL_TABLET | Freq: Two times a day (BID) | ORAL | Status: DC
  Administered 2021-03-17 – 2021-03-20 (×6): 500 mg via ORAL
  Filled 2021-03-17 (×7): qty 1

## 2021-03-17 MED ORDER — KETOROLAC TROMETHAMINE 15 MG/ML IJ SOLN
30.0000 mg | Freq: Four times a day (QID) | INTRAMUSCULAR | Status: AC
  Administered 2021-03-17 – 2021-03-18 (×3): 30 mg via INTRAVENOUS
  Filled 2021-03-17 (×4): qty 2

## 2021-03-17 NOTE — Op Note (Signed)
NAME: Breanna Malone, Breanna Malone MEDICAL RECORD NO: [id number] ACCOUNT NO: [id number] DATE OF BIRTH: Jan 01, 1981 FACILITY: [facility] LOCATION: [facility] PHYSICIAN: Revonda Standard. 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  PROCEDURE:  Resection of chest wall mass with 9th rib resection and repair of defect using Ventrilight ST mesh.  SURGEON:  Revonda Standard. Roxan Hockey, MD  ASSISTANT:  Lars Pinks, PA-C.  ANESTHESIA:  General.  FINDINGS:  Large bony tumor involving left 9th rib approximately 15 x 7 x 6 cm.  No evidence of invasion of surrounding tissues.  CLINICAL NOTE:  Breanna Malone is a 41 year old woman with a longstanding mass of the left 9th rib.  She recently has been having pain in that area.  An MR showed an 11.3 x 4.9 x 4.6 cm expansile lesion of the left 9th rib with suggestion of an enlarging  soft tissue component concerning for malignant transformation.  She was offered surgical resection for definitive diagnosis and treatment.  The indications, risks, benefits, and alternatives were discussed in detail with the patient.  She understood and  accepted the risks and agreed to proceed.  OPERATIVE NOTE:  Breanna Malone was brought to the preoperative holding area on 03/16/2021.  Anesthesia placed an arterial blood pressure monitoring line and established intravenous access.  She was taken to the operating room, anesthetized, and intubated  with a double-lumen endotracheal tube.  Intravenous antibiotics were administered.  A Foley catheter was placed.  Sequential compression devices were placed on the calves for DVT prophylaxis.  She was placed in a right lateral decubitus position.  A Bair  Hugger was placed for active warming.  The left chest was prepped and draped in the usual sterile fashion.  A timeout was performed.  A solution containing 20 mL of liposomal bupivacaine and  30 mL of 0.5% bupivacaine was prepared. This solution was injected locally at the surgical site.  An incision was made over the mass.  It was carried through the skin and  subcutaneous tissue.  The inferior aspect of the latissimus muscle was divided.  The serratus was dissected off the mass and retracted anteriorly and superiorly.  Planes were developed around the mass, which did not have any apparent invasion into  surrounding tissues inferiorly. The plane developed along the superior edge of the 10th rib very cleanly superiorly. The tumor was more irregular and encroached on the interspace along the inferior aspect of the eighth rib.  The anterior extent of the  tumor was identified, and the costal cartilage was divided with approximately 1 centimeter of gross margin. An additional centimeter of the rib was removed and sent as permanent pathology for anterior margin.  The dissection then continued working from  an anterior to posterior approach.  The pleura was scored posterior to the extent of the tumor.  This was well anterior to the spine. After developing the plane posteriorly, the rib was divided posteriorly with a greater than 1 cm gross margin.  The  tumor was removed.  There was an area of soft tissue along the inferior edge of the eighth rib that was concerning for possible invasion.  This was sent for frozen section, which showed only benign tissue.  The resulting defect was 7 x 18 cm. Anteriorly,  there was some separation of the diaphragm from the musculature, and this was repaired with a running #1 PDS suture.  The surgical  mesh was cut to fit the defect and was sewn to the 9th rib posteriorly using simple interrupted sutures and then sutured  to the eighth rib superiorly using both simple and figure-of-eight sutures.  The mesh then was stretched taut and sutured to the 10th rib inferiorly.  Holes were drilled through the rib, and the sutures were placed through these holes rather  than  around the rib.  After completing the patch, the wound was copiously irrigated with warm saline.  A 19-French Blake drain was left in the pleural space prior to the patch closure.  A second 19-French drain was placed in the  wound overlying the patch. The serratus then was reapproximated to its inferolateral attachments with running 0 Vicryl suture.  The latissimus was reapproximated with running 0 Vicryl sutures.  Subcutaneous tissue was closed with a running 2-0 Vicryl  suture and the skin was closed with a 3-0 Vicryl subcuticular suture.  The pleural drain was placed to a Pleur-evac.  The chest wall drain was placed to bulb suction.  The patient was extubated in the operating room and taken to the postanesthetic care  unit in good condition.  All sponge, needle, and instrument counts were correct at the end of the procedure.  Experienced assistant was necessary for this case due to complexity.  Lars Pinks provided that service.  She provided exposure, retraction, suctioning, as well as suturing and assisting with wound closure.   [facility] D: 03/16/2021 3:29:45 pm T: 03/17/2021 11:16:00 am  JOB: [id number]/ [id number]

## 2021-03-17 NOTE — Progress Notes (Signed)
Mobility Specialist Progress Note    03/17/21 1658  Mobility  Bed Position Chair  Activity Transferred from bed to chair  Level of Assistance Minimal assist, patient does 75% or more  Assistive Device  (HHA)  Distance Ambulated (ft) 5 ft  Activity Response Tolerated fair  $Mobility charge 1 Mobility   Left with call bell in reach and NT present.   [facility] Mobility Specialist  M.S. 2C and 6E: [phone] M.S. 4E: [phone]

## 2021-03-17 NOTE — Progress Notes (Addendum)
° °   °  [address]       [place] [address]             [phone]       1 Day Post-Op Procedure(s) (LRB): RESECTION OF TUMOR LEFT NINTH RIB (Left)  Subjective: Patient eating breakfast. She has complaints of left shoulder pain and pain at incision site.  Objective: Vital signs in last 24 hours: Temp:  [97.1 F (36.2 C)-99 F (37.2 C)] 99 F (37.2 C) (01/27 0300) Pulse Rate:  [67-91] 91 (01/27 0300) Cardiac Rhythm: Normal sinus rhythm (01/26 1912) Resp:  [14-24] 21 (01/27 0353) BP: (100-122)/(57-83) 108/60 (01/27 0300) SpO2:  [93 %-100 %] 94 % (01/27 0353) Arterial Line BP: (113-134)/(59-73) 113/59 (01/26 1310)     Intake/Output from previous day: 01/26 0701 - 01/27 0700 In: 2980 [P.O.:1380; I.V.:1600] Out: 2925 [Urine:1875; Blood:800; Chest Tube:250]   Physical Exam:  Cardiovascular: RRR Pulmonary: Clear to auscultation bilaterally Abdomen: Soft, non tender, bowel sounds present. Extremities: SCDs in place Wounds: Clean and dry.  No erythema or signs of infection. Chest Tubes:Posterior to water seal, no air leak. Anterior to bulb suction  Lab Results: CBC: Recent Labs    03/14/21 1108 03/17/21 0041  WBC 8.0 12.4*  HGB 12.5 10.3*  HCT 38.3 33.0*  PLT 334 313   BMET:  Recent Labs    03/14/21 1108 03/17/21 0041  NA 137 134*  K 3.8 3.7  CL 103 102  CO2 24 26  GLUCOSE 122* 156*  BUN 7 9  CREATININE 0.72 0.86  CALCIUM 9.5 8.2*    PT/INR:  Recent Labs    03/14/21 1108  LABPROT 12.7  INR 1.0   ABG:  INR: Will add last result for INR, ABG once components are confirmed Will add last 4 CBG results once components are confirmed  Assessment/Plan:  1. CV - SR. On HCTZ 6.25 mg daily. Will restart Lisinopril likely at discharge;sooner if BP allows. 2.  Pulmonary - On 2 liters of oxygen via [place] (likely bc of PCA). Chest tubes with 250 cc since surgery. Chest tube is to water seal and there is no air leak (130 per pleura vac).   Chest tube to bulb suction with 100 cc in there (sero sanguinous). CXR this am appears stable. Will remove posterior chest tube. Encourage incentive spirometer. Await final pathology 3. DM-CBGs 115/144/127. Will restart Metformin. Pre op HGA1C 6.3 4. Supplement potassium 5. Expected post op blood loss anemia-H and H this am 10.3 and 33. 6. Decrease IVF, remove foley 7. Will increase Toradol for better pain control and start Gabapentin 8. On Lovenox for DVT prophylaxis  Jaisa Defino M ZimmermanPA-C 03/17/2021,7:24 AM [phone]

## 2021-03-17 NOTE — Progress Notes (Signed)
Pt refused to have CT or foley removed the first 2 times RN discussed it with pt. Pt asked for 15 mins initially, and when RN returned, pt asked for 15 more mins. RN returned the third time and pt agrees to allow RN to remove CT and foley

## 2021-03-17 NOTE — Plan of Care (Signed)

## 2021-03-18 ENCOUNTER — Inpatient Hospital Stay (HOSPITAL_COMMUNITY): Payer: Medicaid Other

## 2021-03-18 LAB — COMPREHENSIVE METABOLIC PANEL
ALT: 26 U/L (ref 0–44)
AST: 44 U/L — ABNORMAL HIGH (ref 15–41)
Albumin: 2.9 g/dL — ABNORMAL LOW (ref 3.5–5.0)
Alkaline Phosphatase: 77 U/L (ref 38–126)
Anion gap: 7 (ref 5–15)
BUN: <5 mg/dL — ABNORMAL LOW (ref 6–20)
CO2: 24 mmol/L (ref 22–32)
Calcium: 8.1 mg/dL — ABNORMAL LOW (ref 8.9–10.3)
Chloride: 106 mmol/L (ref 98–111)
Creatinine, Ser: 0.72 mg/dL (ref 0.44–1.00)
GFR, Estimated: >60 mL/min (ref >60)
Glucose, Bld: 132 mg/dL — ABNORMAL HIGH (ref 70–99)
Potassium: 3.9 mmol/L (ref 3.5–5.1)
Sodium: 137 mmol/L (ref 135–145)
Total Bilirubin: 0.6 mg/dL (ref 0.3–1.2)
Total Protein: 5.8 g/dL — ABNORMAL LOW (ref 6.5–8.1)

## 2021-03-18 LAB — GLUCOSE, CAPILLARY
Glucose-Capillary: 101 mg/dL — ABNORMAL HIGH (ref 70–99)
Glucose-Capillary: 94 mg/dL (ref 70–99)
Glucose-Capillary: 95 mg/dL (ref 70–99)
Glucose-Capillary: 98 mg/dL (ref 70–99)

## 2021-03-18 LAB — POCT I-STAT 7, (LYTES, BLD GAS, ICA,H+H)
Acid-Base Excess: 0 mmol/L (ref 0.0–2.0)
Bicarbonate: 26.4 mmol/L (ref 20.0–28.0)
Calcium, Ion: 1.22 mmol/L (ref 1.15–1.40)
HCT: 34 % — ABNORMAL LOW (ref 36.0–46.0)
Hemoglobin: 11.6 g/dL — ABNORMAL LOW (ref 12.0–15.0)
O2 Saturation: 99 %
Potassium: 4.1 mmol/L (ref 3.5–5.1)
Sodium: 134 mmol/L — ABNORMAL LOW (ref 135–145)
TCO2: 28 mmol/L (ref 22–32)
pCO2 arterial: 46.8 mmHg (ref 32.0–48.0)
pH, Arterial: 7.359 (ref 7.350–7.450)
pO2, Arterial: 122 mmHg — ABNORMAL HIGH (ref 83.0–108.0)

## 2021-03-18 LAB — CBC
HCT: 31.9 % — ABNORMAL LOW (ref 36.0–46.0)
Hemoglobin: 9.9 g/dL — ABNORMAL LOW (ref 12.0–15.0)
MCH: 30.2 pg (ref 26.0–34.0)
MCHC: 31 g/dL (ref 30.0–36.0)
MCV: 97.3 fL (ref 80.0–100.0)
Platelets: 277 10*3/uL (ref 150–400)
RBC: 3.28 MIL/uL — ABNORMAL LOW (ref 3.87–5.11)
RDW: 11.9 % (ref 11.5–15.5)
WBC: 12.7 10*3/uL — ABNORMAL HIGH (ref 4.0–10.5)
nRBC: 0 % (ref 0.0–0.2)

## 2021-03-18 NOTE — Progress Notes (Addendum)
° °   °  [address]       [place] [address]             [phone]      2 Days Post-Op Procedure(s) (LRB): RESECTION OF TUMOR LEFT NINTH RIB (Left)  Subjective:  Doing good.  Has gotten up to the chair and ambulated yesterday.  Pain is well controlled  Objective: Vital signs in last 24 hours: Temp:  [98.5 F (36.9 C)-100 F (37.8 C)] 99 F (37.2 C) (01/28 0313) Pulse Rate:  [93-110] 109 (01/28 0800) Cardiac Rhythm: Sinus tachycardia (01/28 0754) Resp:  [14-22] 20 (01/28 0800) BP: (95-128)/(60-84) 119/84 (01/28 0800) SpO2:  [83 %-100 %] 92 % (01/28 0800)  Intake/Output from previous day: 01/27 0701 - 01/28 0700 In: 240 [P.O.:240] Out: 820 [Urine:700; Chest Tube:120]  General appearance: alert, cooperative, and no distress Heart: regular rate and rhythm Lungs: clear to auscultation bilaterally Abdomen: soft, non-tender; bowel sounds normal; no masses,  no organomegaly Extremities: extremities normal, atraumatic, no cyanosis or edema Wound: clean and dry  Lab Results: Recent Labs    03/17/21 0041 03/18/21 0100  WBC 12.4* 12.7*  HGB 10.3* 9.9*  HCT 33.0* 31.9*  PLT 313 277   BMET:  Recent Labs    03/17/21 0041 03/18/21 0100  NA 134* 137  K 3.7 3.9  CL 102 106  CO2 26 24  GLUCOSE 156* 132*  BUN 9 <5*  CREATININE 0.86 0.72  CALCIUM 8.2* 8.1*    PT/INR: No results for input(s): LABPROT, INR in the last 72 hours. ABG    Component Value Date/Time   PHART 7.359 03/16/2021 0944   HCO3 26.4 03/16/2021 0944   TCO2 28 03/16/2021 0944   O2SAT 99.0 03/16/2021 0944   CBG (last 3)  Recent Labs    03/17/21 1643 03/17/21 2306 03/18/21 0607  GLUCAP 116* 140* 95    Assessment/Plan: S/P Procedure(s) (LRB): RESECTION OF TUMOR LEFT NINTH RIB (Left)  CV- NSR, continue HCTZ, BP controlled, will resume Lisinopril as BP allows Pulm- weaning oxygen as tolerated, CXR w/o pneumothorax, effusions... JP drain output 15 cc  Renal- creatinine  normal at 0.72, K is okay at 3.9 DM- sugars controlled Pain control- continue current regimen, will plan to d/c PCA in AM to work on oral pain control DVT prophylaxis with lovenox   LOS: 2 days    Ellwood Handler, PA-C 03/18/2021  Patient examined and CXR image reviewed- agree with above assessment and plan  P Prescott Gum MD

## 2021-03-19 LAB — GLUCOSE, CAPILLARY
Glucose-Capillary: 151 mg/dL — ABNORMAL HIGH (ref 70–99)
Glucose-Capillary: 107 mg/dL — ABNORMAL HIGH (ref 70–99)
Glucose-Capillary: 121 mg/dL — ABNORMAL HIGH (ref 70–99)

## 2021-03-19 MED ORDER — LISINOPRIL 5 MG PO TABS
5.0000 mg | ORAL_TABLET | Freq: Every day | ORAL | Status: DC
  Administered 2021-03-19 – 2021-03-20 (×2): 5 mg via ORAL
  Filled 2021-03-19 (×2): qty 1

## 2021-03-19 NOTE — Plan of Care (Signed)

## 2021-03-19 NOTE — Progress Notes (Signed)
Mobility Specialist Progress Note: ° ° 03/19/21 1223  °Mobility  °Bed Position Chair  °Activity Ambulated with assistance in hallway  °Level of Assistance Contact guard assist, steadying assist  °Assistive Device Front wheel walker  °Distance Ambulated (ft) 200 ft  °Activity Response Tolerated well  °$Mobility charge 1 Mobility  ° °Pt received going to bathroom with NT willing to participate in mobility. No complaints of pain and asymptomatic. Pt left in chair with call bell in reach and all needs met.  ° °Breanna Malone °Mobility Specialist °Primary Phone [phone] °Secondary Phone [phone] ° °

## 2021-03-19 NOTE — Anesthesia Postprocedure Evaluation (Signed)
Anesthesia Post Note  Patient: Breanna Malone  Procedure(s) Performed: RESECTION OF TUMOR LEFT NINTH RIB (Left: Chest)     Patient location during evaluation: PACU Anesthesia Type: General Level of consciousness: awake and alert Pain management: pain level controlled Vital Signs Assessment: post-procedure vital signs reviewed and stable Respiratory status: spontaneous breathing, nonlabored ventilation, respiratory function stable and patient connected to nasal cannula oxygen Cardiovascular status: blood pressure returned to baseline and stable Postop Assessment: no apparent nausea or vomiting Anesthetic complications: no   No notable events documented.  Last Vitals:  Vitals:   03/19/21 0713 03/19/21 0733  BP: 109/69   Pulse: 100   Resp: 19 20  Temp: 36.8 C   SpO2: 96% 96%    Last Pain:  Vitals:   03/19/21 0733  TempSrc:   PainSc: [place]

## 2021-03-19 NOTE — Progress Notes (Addendum)
° °   °  [address]       [place] [address]             [phone]      3 Days Post-Op Procedure(s) (LRB): RESECTION OF TUMOR LEFT NINTH RIB (Left)  Subjective:  Patient with pain this morning.  States she wasn't walked last night and spent a lot of time in bed and now her bottom is sore.  Wants to go home  Objective: Vital signs in last 24 hours: Temp:  [98.2 F (36.8 C)-99 F (37.2 C)] 98.3 F (36.8 C) (01/29 0713) Pulse Rate:  [100-114] 100 (01/29 0713) Cardiac Rhythm: Sinus tachycardia (01/29 0716) Resp:  [17-23] 20 (01/29 0733) BP: (109-152)/(69-86) 109/69 (01/29 0713) SpO2:  [90 %-98 %] 96 % (01/29 0733)  Intake/Output from previous day: 01/28 0701 - 01/29 0700 In: -  Out: 865 [Urine:850; Chest Tube:15] Intake/Output this shift: Total I/O In: -  Out: 40 [Chest Tube:40]  General appearance: alert, cooperative, and no distress Heart: regular rate and rhythm Lungs: clear to auscultation bilaterally Abdomen: soft, non-tender; bowel sounds normal; no masses,  no organomegaly Extremities: extremities normal, atraumatic, no cyanosis or edema Wound: clean and dry  Lab Results: Recent Labs    03/17/21 0041 03/18/21 0100  WBC 12.4* 12.7*  HGB 10.3* 9.9*  HCT 33.0* 31.9*  PLT 313 277   BMET:  Recent Labs    03/17/21 0041 03/18/21 0100  NA 134* 137  K 3.7 3.9  CL 102 106  CO2 26 24  GLUCOSE 156* 132*  BUN 9 <5*  CREATININE 0.86 0.72  CALCIUM 8.2* 8.1*    PT/INR: No results for input(s): LABPROT, INR in the last 72 hours. ABG    Component Value Date/Time   PHART 7.359 03/16/2021 0944   HCO3 26.4 03/16/2021 0944   TCO2 28 03/16/2021 0944   O2SAT 99.0 03/16/2021 0944   CBG (last 3)  Recent Labs    03/18/21 2356 03/19/21 0644 03/19/21 1119  GLUCAP 101* 151* 107*    Assessment/Plan: S/P Procedure(s) (LRB): RESECTION OF TUMOR LEFT NINTH RIB (Left)  CV- hemodynamically stable- on HCTZ, BP elevated at times, will resume home  lisinopril Pulm- JP drain with 40 cc output leave in place Renal- creatinine, lytes are stable DM- sugars remain control Pain control- need to wean off PCA and get oral regimen for pain control. Patient in a lot of pain today after limited mobility yesterday afternoon, will stop PCA tomorrow and work on achieving pain control with oral medications Pressure spot- no ulceration present, placed order for ambulation TID    LOS: 3 days    Ellwood Handler, PA-C  03/19/2021  PCA off Patient with minimal JP drainage, approximate 40 cc. Has not had optimal ambulation schedule past 24 hours but understands that is important in order to be discharged.  Incision clean and dry.  patient examined and medical record reviewed,agree with above note. Dahlia Byes 03/19/2021

## 2021-03-20 LAB — GLUCOSE, CAPILLARY
Glucose-Capillary: 112 mg/dL — ABNORMAL HIGH (ref 70–99)
Glucose-Capillary: 153 mg/dL — ABNORMAL HIGH (ref 70–99)

## 2021-03-20 MED ORDER — KETOROLAC TROMETHAMINE 30 MG/ML IJ SOLN
30.0000 mg | Freq: Four times a day (QID) | INTRAMUSCULAR | Status: DC
  Administered 2021-03-20: 30 mg via INTRAVENOUS
  Filled 2021-03-20: qty 1

## 2021-03-20 MED ORDER — GABAPENTIN 300 MG PO CAPS: 300.0000 mg | ORAL_CAPSULE | Freq: Three times a day (TID) | ORAL | 1 refills | Status: DC

## 2021-03-20 MED ORDER — OXYCODONE HCL 5 MG PO TABS: 5.0000 mg | ORAL_TABLET | ORAL | 0 refills | Status: DC | PRN

## 2021-03-20 MED ORDER — GABAPENTIN 300 MG PO CAPS
300.0000 mg | ORAL_CAPSULE | Freq: Three times a day (TID) | ORAL | Status: DC
  Administered 2021-03-20: 300 mg via ORAL
  Filled 2021-03-20: qty 1

## 2021-03-20 NOTE — Progress Notes (Signed)
° °   °  [address]       [place] [address]             [phone]       4 Days Post-Op Procedure(s) (LRB): RESECTION OF TUMOR LEFT NINTH RIB (Left)  Subjective: Patient walked one time yesterday. She has incisional pain.  Objective: Vital signs in last 24 hours: Temp:  [98.5 F (36.9 C)-98.8 F (37.1 C)] 98.7 F (37.1 C) (01/30 0445) Pulse Rate:  [87-103] 99 (01/30 0445) Cardiac Rhythm: Normal sinus rhythm (01/29 2331) Resp:  [14-24] 14 (01/30 0506) BP: (100-128)/(58-83) 100/58 (01/30 0445) SpO2:  [90 %-98 %] 95 % (01/30 0506)     Intake/Output from previous day: 01/29 0701 - 01/30 0700 In: 240 [P.O.:240] Out: 90 [Chest Tube:90]   Physical Exam:  Cardiovascular: RRR Pulmonary: Clear to auscultation bilaterally Abdomen: Soft, non tender, bowel sounds present. Wounds: Aquacel removed and wound is clean and dry.  No erythema or signs of infection. Chest Tube: Blake drain to bulb suction;sero sanguinous drainage (scant in drain this am)  Lab Results: CBC: Recent Labs    03/18/21 0100  WBC 12.7*  HGB 9.9*  HCT 31.9*  PLT 277    BMET:  Recent Labs    03/18/21 0100  NA 137  K 3.9  CL 106  CO2 24  GLUCOSE 132*  BUN <5*  CREATININE 0.72  CALCIUM 8.1*     PT/INR:  No results for input(s): LABPROT, INR in the last 72 hours.  ABG:  INR: Will add last result for INR, ABG once components are confirmed Will add last 4 CBG results once components are confirmed  Assessment/Plan:  1. CV - SR, ST at times. On HCTZ 6.25 mg daily and Lisinopril 5 mg daily. 2.  Pulmonary - On 1 liter of oxygen via [place].Blake drain with 90 cc last 24 hours;to bulb suction. CXR this am appears stable. Will remove posterior chest tube. Encourage incentive spirometer. Await final pathology 3. DM-CBGs 121/112/153. On Metformin. Pre op HGA1C 6.3 4. Expected post op blood loss anemia-H and H this am 10.3 and 33. 5. On Lovenox for DVT prophylaxis 6. Regarding pain  control, on Gabapentin 300 mg bid, PCA, Oxy PRN. Need to stop PCA and manage pain with above. Will give Toradol to help with pain. Will increase Gabapentin to tid. 7. Discharge later today;HH to be arranged for drain care  Andras Grunewald M ZimmermanPA-C 03/20/2021,7:16 AM [phone]

## 2021-03-20 NOTE — TOC Progression Note (Addendum)
Transition of Care [facility]) - Progression Note    Patient Details  Name: Breanna Malone MRN: [id number] Date of Birth: 19-Oct-1980  Transition [facility]) CM/SW [place], RN Phone Number:[phone]  03/20/2021, 12:58 PM  Clinical Narrative:  1117 Rolling walker ordered per [place] and will be delivered to the room  1325 CM received consult for [facility] referral. CM at the bedside to offer choice. Cm explained to patient and family member at the bedside that CM may not be able to obtain [facility] services. Visitor states that someone will be with the patient at all times and that he is very comfortable with dressing changes because he has had to change dressing before for his brother. Both patient and visitor verbalized understanding of the possibility of family needing to be taught  dressing change care.  CM unable to secure acceptance of [facility] referral. Referral sent to the following: [place], [place], [place], [place], [place], [place], and [place]. All agencies have declined the referral. These are all of the agencies listed for [facility] in the patients service area. Message has been sent to [facility] PA-C and nurse Hilary Hertz RN.  [place] Per Lars Pinks PA-C patient and family have been taught dressing care. TOC will sign off.           Expected Discharge Plan and Services           Expected Discharge Date: 03/20/21                                     Social Determinants of Health (SDOH) Interventions    Readmission Risk Interventions No flowsheet data found.

## 2021-03-20 NOTE — Progress Notes (Signed)
1.5 mL fentanyl PCA wasted in steri with Silvestre Mesi, RN

## 2021-03-20 NOTE — Plan of Care (Signed)

## 2021-03-27 ENCOUNTER — Other Ambulatory Visit: Payer: Self-pay | Admitting: Physician Assistant

## 2021-04-03 ENCOUNTER — Other Ambulatory Visit: Payer: Self-pay | Admitting: Thoracic Surgery (Cardiothoracic Vascular Surgery)

## 2021-04-03 ENCOUNTER — Telehealth: Payer: Self-pay | Admitting: *Deleted

## 2021-04-03 DIAGNOSIS — D492 Neoplasm of unspecified behavior of bone, soft tissue, and skin: Secondary | ICD-10-CM

## 2021-04-03 NOTE — Telephone Encounter (Signed)
Patient contacted the office requesting to have JP drain removed today. Per patient, the drain prevents her from doing things for herself and her 43 year old child. Patient states the drain is causing discomfort and soreness. Patient asked how often she drains her JP drain and she is unable to give a clear answer. Patient denies keeping a log of output from tube. Patient upset on telephone stating she does not have enough help at home and has no one to rely on. Advised patient she has an appt tomorrow with Dr. Roxan Hockey and will potentially get the tube removed tomorrow. Advised patient of chest xray prior to appt. Offered emotional support to patient. Patient acknowledges appt and understanding.

## 2021-04-04 ENCOUNTER — Encounter: Payer: Self-pay | Admitting: Thoracic Surgery (Cardiothoracic Vascular Surgery)

## 2021-04-04 ENCOUNTER — Other Ambulatory Visit: Payer: Self-pay

## 2021-04-04 ENCOUNTER — Ambulatory Visit (INDEPENDENT_AMBULATORY_CARE_PROVIDER_SITE_OTHER): Payer: Self-pay | Admitting: Thoracic Surgery (Cardiothoracic Vascular Surgery)

## 2021-04-04 ENCOUNTER — Ambulatory Visit
Admission: RE | Admit: 2021-04-04 | Discharge: 2021-04-04 | Disposition: A | Discharge status: Home / Self Care | Payer: Medicaid Other | Source: Ambulatory Visit | Attending: Thoracic Surgery (Cardiothoracic Vascular Surgery) | Admitting: Thoracic Surgery (Cardiothoracic Vascular Surgery)

## 2021-04-04 VITALS — BP 125/79 | HR 88 | Resp 20 | Ht 64.0 in | Wt 240.0 lb

## 2021-04-04 DIAGNOSIS — D492 Neoplasm of unspecified behavior of bone, soft tissue, and skin: Secondary | ICD-10-CM

## 2021-04-04 DIAGNOSIS — Z09 Encounter for follow-up examination after completed treatment for conditions other than malignant neoplasm: Secondary | ICD-10-CM

## 2021-04-04 DIAGNOSIS — D169 Benign neoplasm of bone and articular cartilage, unspecified: Secondary | ICD-10-CM

## 2021-04-04 NOTE — Progress Notes (Signed)
[address]       Breanna Malone             [phone]     HPI: Breanna Malone returns for follow-up after recent rib resection.  Breanna Malone is a 41 year old woman with a history of hypertension, hyperlipidemia, type 2 diabetes, bipolar disorder, asthma, arthritis, and a rib mass.  She was first discovered to have a rib mass back in 2011.  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  Last February she was experiencing some flank pain.  CT of the abdomen pelvis showed the left ninth rib mass had increased in size compared to a film in 2011.  In September she started having pain again.  An MRI was done which showed an enlarging soft tissue component within the overall larger mass that was concerning for possible malignant transformation.  I did a left ninth rib resection and repair of the defect with mesh on 03/16/2021.  Her postoperative recovery was unremarkable and she went home on postoperative day #4.  She did go home with a JP drain in place.  She has had some incisional pain.  She went through her original prescription of oxycodone quickly as she was using 2 tablets every 4 hours.  She currently is taking 1 oxycodone tablet 3-4 times a day.  She is also on gabapentin 300 mg 3 times daily.   Past Medical History:  Diagnosis Date   Abnormal Pap smear    Arthritis    Asthma    Asthma    Bipolar 1 disorder (HCC)    Bone spur    rib   Bronchitis    BV (bacterial vaginosis) 10/08/2019   8/19 rx flagyl   Carpal tunnel syndrome    Cervical spine pain    Chest pain    Diabetes mellitus without complication (HCC)    Elevated cholesterol    High cholesterol    Hyperlipidemia    Hyperlipoproteinemia    Hypertension    Post traumatic stress disorder    Right ovarian cyst 10/31/2012   Complex right ovarian cyst seen 8/18 in ER need f/u US   Tendonitis    Vaginal Pap smear, abnormal       Current Outpatient Medications   Medication Sig Dispense Refill   gabapentin (NEURONTIN) 300 MG capsule Take 1 capsule (300 mg total) by mouth 3 (three) times daily. 90 capsule 1   hydrochlorothiazide (HYDRODIURIL) 12.5 MG tablet Take 6.25 mg by mouth daily.     ibuprofen (ADVIL) 200 MG tablet Take 400 mg by mouth every 6 (six) hours as needed for moderate pain or headache.     lisinopril (ZESTRIL) 5 MG tablet Take 5 mg by mouth daily.     metFORMIN (GLUCOPHAGE) 500 MG tablet Take 500 mg by mouth 2 (two) times daily with a meal.     oxyCODONE (OXY IR/ROXICODONE) 5 MG immediate release tablet TAKE (1) TABLET BY MOUTH EVERY FOUR HOURS AS NEEDED. 30 tablet 0   pravastatin (PRAVACHOL) 40 MG tablet Take 40 mg by mouth 3 (three) times a week.     PROAIR HFA 108 (90 Base) MCG/ACT inhaler Inhale 1-2 puffs into the lungs every 6 (six) hours as needed for wheezing.     Current Facility-Administered Medications  Medication Dose Route Frequency Provider Last Rate Last Admin   prenatal vitamin w/FE, FA (NATACHEW) chewable tablet 1 tablet  1 tablet Oral Q1200 Cresenzo-Dishmon, Joaquim Lai,  CNM        Physical Exam BP 125/79    Pulse 88    Resp 20    Ht 5\' 4"  (1.626 m)    Wt 240 lb (108.9 kg)    LMP 03/13/2021    SpO2 95% Comment: RA   BMI 41.20 kg/m  Anxious 41 year old woman in no acute distress Alert and oriented x3 Incision clean dry and intact Minimal serous drainage in JP bulb  Diagnostic Tests: I personally reviewed her chest x-ray.  There is good aeration of the lungs bilaterally with no pleural effusion.  Postop changes from ninth rib resection.  Impression: Breanna Malone is a 41 year old woman with a longstanding mass of the left ninth rib.  More recently it has been causing her pain.  A CT showed an increase in the overall size of the lesion compared to a remote scan.  More recently an MR showed an increase in the soft tissue component concerning for possible malignant transformation.  Because of that we recommended resection of  the mass.  That was done on 03/16/2021.Marland Kitchen  Overall I think she is doing extremely well this early after the surgery.  She does have some incisional pain which is to be expected.  She will continue to take gabapentin 300 mg 3 times daily.  She is using oxycodone 5 mg tablets 3-4 times a day.  Her chest x-ray looks great.  There is no pleural effusion.  Pathology is still pending.  The specimen was sent out for outside consultation to [place].  JP drain removed without difficulty  Plan: Encouraged her to increase her physical activities gradually.  Avoid heavy lifting.  Walk is much as possible.  Return in 1 month with PA and lateral chest x-ray  Melrose Nakayama, MD [facility] [phone]

## 2021-04-18 ENCOUNTER — Other Ambulatory Visit: Payer: Self-pay | Admitting: Physician Assistant

## 2021-04-18 ENCOUNTER — Telehealth: Payer: Self-pay | Admitting: *Deleted

## 2021-04-18 MED ORDER — OXYCODONE HCL 5 MG PO TABS: 5.0000 mg | ORAL_TABLET | Freq: Four times a day (QID) | ORAL | 0 refills | Status: AC | PRN

## 2021-04-18 NOTE — Telephone Encounter (Signed)
Patient called requesting a refill of oxycodone. Patient is s/p rib resection 1/26 by Dr. Roxan Hockey. Per patient, she is still experiencing left sided incisional pain. Patient unable to describe pain only stating she still "hurts". Patient states Tylenol and Ibuprofen do not help. States she has been taking the oxycodone about every 4 hours to help with pain. Patient states she is still taking Gabapentin. Per Macarthur Critchley, PA, refill will be sent to patient's preferred pharmacy.

## 2021-04-28 ENCOUNTER — Encounter (HOSPITAL_COMMUNITY): Payer: Self-pay

## 2021-04-28 LAB — SURGICAL PATHOLOGY

## 2021-05-01 ENCOUNTER — Other Ambulatory Visit: Payer: Self-pay | Admitting: Thoracic Surgery (Cardiothoracic Vascular Surgery)

## 2021-05-01 DIAGNOSIS — D492 Neoplasm of unspecified behavior of bone, soft tissue, and skin: Secondary | ICD-10-CM

## 2021-05-02 ENCOUNTER — Other Ambulatory Visit: Payer: Self-pay

## 2021-05-02 ENCOUNTER — Ambulatory Visit
Admission: RE | Admit: 2021-05-02 | Discharge: 2021-05-02 | Disposition: A | Discharge status: Home / Self Care | Payer: Medicaid Other | Source: Ambulatory Visit | Attending: Thoracic Surgery (Cardiothoracic Vascular Surgery) | Admitting: Thoracic Surgery (Cardiothoracic Vascular Surgery)

## 2021-05-02 ENCOUNTER — Encounter: Payer: Self-pay | Admitting: Thoracic Surgery (Cardiothoracic Vascular Surgery)

## 2021-05-02 ENCOUNTER — Ambulatory Visit (INDEPENDENT_AMBULATORY_CARE_PROVIDER_SITE_OTHER): Payer: Self-pay | Admitting: Thoracic Surgery (Cardiothoracic Vascular Surgery)

## 2021-05-02 VITALS — BP 121/78 | HR 88 | Resp 20 | Ht 64.0 in | Wt 243.6 lb

## 2021-05-02 DIAGNOSIS — D169 Benign neoplasm of bone and articular cartilage, unspecified: Secondary | ICD-10-CM

## 2021-05-02 DIAGNOSIS — Z09 Encounter for follow-up examination after completed treatment for conditions other than malignant neoplasm: Secondary | ICD-10-CM

## 2021-05-02 DIAGNOSIS — D492 Neoplasm of unspecified behavior of bone, soft tissue, and skin: Secondary | ICD-10-CM

## 2021-05-02 NOTE — Progress Notes (Signed)
? ?   ?[address] ?      [place] [address] ?            [phone]   ? ? ?HPI: Ms. Hafer returns for a scheduled follow-up visit ? 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  Recently she had been having increasing pain and a CT showed an overall increase in the size of the lesion.  An MR suggested there might be malignant transformation.  She underwent a resection of the mass on 03/16/2021. ? ?She has had issues with pain since surgery.  She noticed some bulging in the left upper quadrant.  She is on gabapentin 300 mg 3 times daily and using oxycodone about twice a day currently. ? ?Past Medical History:  ?Diagnosis Date  ? Abnormal Pap smear   ? Arthritis   ? Asthma   ? Asthma   ? Bipolar 1 disorder ([place])   ? Bone spur   ? rib  ? Bronchitis   ? BV (bacterial vaginosis) 10/08/2019  ? 8/19 rx flagyl  ? Carpal tunnel syndrome   ? Cervical spine pain   ? Chest pain   ? Diabetes mellitus without complication ([place])   ? Elevated cholesterol   ? High cholesterol   ? Hyperlipidemia   ? Hyperlipoproteinemia   ? Hypertension   ? Post traumatic stress disorder   ? Right ovarian cyst 10/31/2012  ? Complex right ovarian cyst seen 8/18 in ER need f/u US  ? Tendonitis   ? Vaginal Pap smear, abnormal   ? ? ?Current Outpatient Medications  ?Medication Sig Dispense Refill  ? gabapentin (NEURONTIN) 300 MG capsule Take 1 capsule (300 mg total) by mouth 3 (three) times daily. 90 capsule 1  ? hydrochlorothiazide (HYDRODIURIL) 12.5 MG tablet Take 6.25 mg by mouth daily.    ? ibuprofen (ADVIL) 200 MG tablet Take 400 mg by mouth every 6 (six) hours as needed for moderate pain or headache.    ? lisinopril (ZESTRIL) 5 MG tablet Take 5 mg by mouth daily.    ? metFORMIN (GLUCOPHAGE) 500 MG tablet Take 500 mg by mouth 2 (two) times daily with a meal.    ? pravastatin (PRAVACHOL) 40 MG tablet Take 40 mg by mouth 3 (three) times a week.    ? PROAIR HFA 108 (90 Base) MCG/ACT inhaler  Inhale 1-2 puffs into the lungs every 6 (six) hours as needed for wheezing.    ? ?Current Facility-Administered Medications  ?Medication Dose Route Frequency Provider Last Rate Last Admin  ? prenatal vitamin w/FE, FA (NATACHEW) chewable tablet 1 tablet  1 tablet Oral Q1200 Cresenzo-Dishmon, Joaquim Lai, CNM      ? ? ?Physical Exam ?BP 121/78 (BP Location: Right Arm, Patient Position: Sitting, Cuff Size: Large)   Pulse 88   Resp 20   Ht '5\' 4"'$  (1.626 m)   Wt 243 lb 9.6 oz (110.5 kg)   SpO2 97%   BMI 41.30 kg/m?  ?41 year old woman in no acute distress ?Alert and oriented x 3 ?Lungs clear with equal breath sounds bilaterally ?Incision well-healed ? ? ?Diagnostic Tests: ?I personally reviewed the chest x-ray.  Postoperative changes.  No active disease. ? ? ?SURGICAL PATHOLOGY  ?CASE: [id number]  ?PATIENT: Breanna Malone  ?Surgical Pathology Report  ? ? ? ? ?Clinical History: bone tumor left 9th rib (cm)  ? ? ?FINAL MICROSCOPIC DIAGNOSIS:  ? ?A. SOFT TISSUE, LEFT 9TH RIB SUPERIOR MARGIN,  BIOPSY:  ?-  Uninvolved by tumor  ? ?B. SOFT TISSUE TUMOR, LEFT 9TH RIB, RESECTION:  ?-  Fibrous dysplasia  ?-  See comment  ? ?C. SOFT TISSUE, LEFT 9TH RIB ANTERIOR MARGIN, BIOPSY:  ?-  Uninvolved by tumor  ? ?D. SOFT TISSUE NODULE, BIOPSY:  ?-  Fibrovascular and soft tissue with histiocytes  ?-  No tumor identified  ? ?COMMENT:  ? ?The above diagnosis and following comment was provided by Dr. Kris Mouton  ?at [facility]:  ? 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  The spindle cells show multifocal positivity for SMA  ?and SATB2, as is typical.  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  I see no evidence of malignancy.  ?The specimen is labeled A and C are negative for tumor.  The specimen   ?labeled D shows mesothelial lined fibrovascular tissue within which  ?there are nodules of bland ovoid cells, probably representing  ?histiocytes, but I do not have any unstained sections or blocks by which  ?to further characterize the latter process."  ? ? ? ?Impression: ?Breanna Malone is a 41 year old woman who had a resection of a left ninth rib mass.  She was having pain at the site and had had signs of increased growth recently. ? 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  There was no evidence of malignancy. ? ?She continues to have some postoperative neuralgia.  Not surprising given the nature of the surgery.  She is continuing to use gabapentin 300 mg 3 times daily.  She also continues to use oxycodone.  I discussed with her trying to wean off of that medication eventually.  She has done a good job of tapering down and she is now only using it about twice a day. ? ?Plan: ?Return in 2 months with PA lateral chest x-ray to check on progress ? ?Melrose Nakayama, MD ?[facility] ?[phone] ? ? ? ? ?

## 2021-05-06 ENCOUNTER — Other Ambulatory Visit: Payer: Self-pay | Admitting: Physician Assistant

## 2021-07-03 ENCOUNTER — Other Ambulatory Visit: Payer: Self-pay | Admitting: Thoracic Surgery (Cardiothoracic Vascular Surgery)

## 2021-07-03 DIAGNOSIS — D492 Neoplasm of unspecified behavior of bone, soft tissue, and skin: Secondary | ICD-10-CM

## 2021-07-03 DIAGNOSIS — D169 Benign neoplasm of bone and articular cartilage, unspecified: Secondary | ICD-10-CM

## 2021-07-04 ENCOUNTER — Ambulatory Visit
Admission: RE | Admit: 2021-07-04 | Discharge: 2021-07-04 | Disposition: A | Discharge status: Home / Self Care | Payer: Medicaid Other | Source: Ambulatory Visit | Attending: Thoracic Surgery (Cardiothoracic Vascular Surgery) | Admitting: Thoracic Surgery (Cardiothoracic Vascular Surgery)

## 2021-07-04 ENCOUNTER — Ambulatory Visit (INDEPENDENT_AMBULATORY_CARE_PROVIDER_SITE_OTHER): Payer: Medicaid Other | Admitting: Thoracic Surgery (Cardiothoracic Vascular Surgery)

## 2021-07-04 VITALS — BP 137/84 | HR 78 | Resp 20 | Ht 64.0 in | Wt 239.0 lb

## 2021-07-04 DIAGNOSIS — Z09 Encounter for follow-up examination after completed treatment for conditions other than malignant neoplasm: Secondary | ICD-10-CM

## 2021-07-04 DIAGNOSIS — D169 Benign neoplasm of bone and articular cartilage, unspecified: Secondary | ICD-10-CM

## 2021-07-04 NOTE — Progress Notes (Signed)
? ?   ?[address] ?      [place] [address] ?            [phone]   ? ? ?HPI: [name] returns for scheduled a follow-up appointment after removal of a left rib lesion. ? ?Breanna Malone is a 41 year old woman who had a longstanding mass lesion in the left ninth rib.  She developed increasing pain and CT showed increase in size of the lesion.  MR suggested possible malignant transformation.  I did a resection and reconstruction on 03/16/2021. ? ?She had a lot of pain issues initially.  She was on gabapentin and oxycodone.  She remains on gabapentin but has been able to wean off the oxycodone altogether. ? ?She is interested in returning to work but concerned about it at the same time. ? ? ?Past Medical History:  ?Diagnosis Date  ? Abnormal Pap smear   ? Arthritis   ? Asthma   ? Asthma   ? Bipolar 1 disorder ([place])   ? Bone spur   ? rib  ? Bronchitis   ? BV (bacterial vaginosis) 10/08/2019  ? 8/19 rx flagyl  ? Carpal tunnel syndrome   ? Cervical spine pain   ? Chest pain   ? Diabetes mellitus without complication ([place])   ? Elevated cholesterol   ? High cholesterol   ? Hyperlipidemia   ? Hyperlipoproteinemia   ? Hypertension   ? Post traumatic stress disorder   ? Right ovarian cyst 10/31/2012  ? Complex right ovarian cyst seen 8/18 in ER need f/u US  ? Tendonitis   ? Vaginal Pap smear, abnormal   ? ? ?Current Outpatient Medications  ?Medication Sig Dispense Refill  ? gabapentin (NEURONTIN) 300 MG capsule TAKE 1 CAPSULE BY MOUTH THREE TIMES A DAY. 90 capsule 0  ? hydrochlorothiazide (HYDRODIURIL) 12.5 MG tablet Take 6.25 mg by mouth daily.    ? ibuprofen (ADVIL) 200 MG tablet Take 400 mg by mouth every 6 (six) hours as needed for moderate pain or headache.    ? lisinopril (ZESTRIL) 5 MG tablet Take 5 mg by mouth daily.    ? metFORMIN (GLUCOPHAGE) 500 MG tablet Take 500 mg by mouth 2 (two) times daily with a meal.    ? pravastatin (PRAVACHOL) 40 MG tablet Take 40 mg by mouth 3 (three) times a week.     ? PROAIR HFA 108 (90 Base) MCG/ACT inhaler Inhale 1-2 puffs into the lungs every 6 (six) hours as needed for wheezing.    ? ?Current Facility-Administered Medications  ?Medication Dose Route Frequency Provider Last Rate Last Admin  ? prenatal vitamin w/FE, FA (NATACHEW) chewable tablet 1 tablet  1 tablet Oral Q1200 Cresenzo-Dishmon, Joaquim Lai, CNM      ? ? ?Physical Exam ?BP 137/84 (BP Location: Right Arm, Patient Position: Sitting)   Pulse 78   Resp 20   Ht '5\' 4"'$  (1.626 m)   Wt 239 lb (108.4 kg)   SpO2 97% Comment: RA  BMI 41.02 kg/m?  ?41 year old woman in no acute distress ?Alert and oriented x3 with no focal deficits ?Incision well-healed, small seroma under wound, chest tube sites well-healed ? ?Diagnostic Tests: ?I personally reviewed the chest x-ray.  Status post resection of ninth rib. ? ?Impression: ?Breanna Malone is a 41 year old woman who had progressively enlarging growth of the left ninth rib.  She underwent surgical resection.  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 ? ?  She is doing well at this point.  She is now off narcotics.  She remains on gabapentin. ? ?At this point I encouraged her to gradually increase her activities.  We will set up tentative return to work date June 1.  Starting for the first couple weeks and recommended she work about 3 days a week rather than 5 and also to not lift more than 25 pounds for the first couple of weeks. ? ? ?Plan: ?Return in 3 months to check on progress ? ?Melrose Nakayama, MD ?[facility] ?[phone] ? ? ? ?

## 2021-07-17 ENCOUNTER — Emergency Department (HOSPITAL_COMMUNITY)
Admission: EM | Admit: 2021-07-17 | Discharge: 2021-07-17 | Disposition: A | Discharge status: Home / Self Care | Payer: Medicaid Other | Attending: Emergency Medicine | Admitting: Emergency Medicine

## 2021-07-17 ENCOUNTER — Encounter (HOSPITAL_COMMUNITY): Payer: Self-pay | Admitting: *Deleted

## 2021-07-17 ENCOUNTER — Emergency Department (HOSPITAL_COMMUNITY): Payer: Medicaid Other

## 2021-07-17 ENCOUNTER — Other Ambulatory Visit: Payer: Self-pay

## 2021-07-17 DIAGNOSIS — Z91040 Latex allergy status: Secondary | ICD-10-CM | POA: Insufficient documentation

## 2021-07-17 DIAGNOSIS — Z79899 Other long term (current) drug therapy: Secondary | ICD-10-CM | POA: Diagnosis not present

## 2021-07-17 DIAGNOSIS — M25561 Pain in right knee: Secondary | ICD-10-CM | POA: Insufficient documentation

## 2021-07-17 DIAGNOSIS — Z7984 Long term (current) use of oral hypoglycemic drugs: Secondary | ICD-10-CM | POA: Diagnosis not present

## 2021-07-17 NOTE — ED Provider Notes (Signed)
[facility] EMERGENCY DEPARTMENT Provider Note   CSN: [id number] Arrival date & time: 07/17/21  2050     History  Chief Complaint  Patient presents with   Knee Pain    Breanna Malone is a 41 y.o. adult with past medical history significant for obesity, bipolar who presents with concern for right knee pain since Friday, reports that she feels like she is unable to bend the knee.  Patient reports the her birthday was on [birth year], she does not remember anything specific about how she might of injured it but she does feel like she has been walking around, doing more than she normally would.  Patient denies any numbness, tingling of the affected extremity.  She denies any previous history of blood clots, she denies any chest pain, shortness of breath, hemoptysis.  Patient has not tried anything for the pain at this time.   Knee Pain     Home Medications Prior to Admission medications   Medication Sig Start Date End Date Taking? Authorizing Provider  gabapentin (NEURONTIN) 300 MG capsule TAKE 1 CAPSULE BY MOUTH THREE TIMES A DAY. 05/07/21   Melrose Nakayama, MD  hydrochlorothiazide (HYDRODIURIL) 12.5 MG tablet Take 6.25 mg by mouth daily.    [provider]  ibuprofen (ADVIL) 200 MG tablet Take 400 mg by mouth every 6 (six) hours as needed for moderate pain or headache.    [provider]  lisinopril (ZESTRIL) 5 MG tablet Take 5 mg by mouth daily. 01/18/21   [provider]  metFORMIN (GLUCOPHAGE) 500 MG tablet Take 500 mg by mouth 2 (two) times daily with a meal.    [provider]  pravastatin (PRAVACHOL) 40 MG tablet Take 40 mg by mouth 3 (three) times a week.    [provider]  PROAIR HFA 108 [phone] Base) MCG/ACT inhaler Inhale 1-2 puffs into the lungs every 6 (six) hours as needed for wheezing. 02/15/20   [provider]      Allergies    Darvocet [propoxyphene n-acetaminophen], Hydrocodone-acetaminophen, Lactose intolerance  (gi), Other, Penicillins, Latex, and Percocet [oxycodone-acetaminophen]    Review of Systems   Review of Systems  Musculoskeletal:  Positive for arthralgias and gait problem.  All other systems reviewed and are negative.  Physical Exam Updated Vital Signs BP 111/76 (BP Location: Right Arm)   Pulse 77   Temp 98.3 F (36.8 C) (Oral)   Resp 17   Ht '5\' 4"'$  (1.626 m)   Wt 102.1 kg   LMP 07/11/2021   SpO2 96%   BMI 38.62 kg/m  Physical Exam Vitals and nursing note reviewed.  Constitutional:      General: She is not in acute distress.    Appearance: Normal appearance.  HENT:     Head: Normocephalic and atraumatic.  Eyes:     General:        Right eye: No discharge.        Left eye: No discharge.  Cardiovascular:     Rate and Rhythm: Normal rate and regular rhythm.     Pulses: Normal pulses.  Pulmonary:     Effort: Pulmonary effort is normal. No respiratory distress.  Musculoskeletal:        General: No deformity.     Comments: Patient with some tenderness palpation around the quadriceps tendon without step-off or deformity, high riding or low riding patella.  She has no laxity to Lachman's, posterior drawer, varus or valgus stress.  She does have intact passive range of  motion of the entire knee, however she is reticent to bend the knee actively.  Mechanically there does not seem to be any obstruction to bending.  She is able to ambulate without difficulty.  She has intact strength 5 out of 5 bilateral lower extremities.  Comparing right to left legs I see no significant discrepancy in edema, possible minimal swelling of the right knee compared to the left.    Skin:    General: Skin is warm and dry.     Capillary Refill: Capillary refill takes less than 2 seconds.     Comments: There is no overlying laceration, redness, or heat of the right knee.  Neurological:     Mental Status: She is alert and oriented to person, place, and time.  Psychiatric:        Mood and Affect: Mood  normal.        Behavior: Behavior normal.    ED Results / Procedures / Treatments   Labs (all labs ordered are listed, but only abnormal results are displayed) Labs Reviewed - No data to display  EKG None  Radiology DG Knee Complete 4 Views Right  Result Date: 07/17/2021 CLINICAL DATA:  Right knee pain EXAM: RIGHT KNEE - COMPLETE 4+ VIEW COMPARISON:  None Available. FINDINGS: No fracture or dislocation is seen. The joint spaces are preserved. The visualized soft tissues are unremarkable. No definite suprapatellar knee joint effusion. IMPRESSION: Negative. Electronically Signed   By: Julian Hy M.D.   On: 07/17/2021 21:38    Procedures Procedures    Medications Ordered in ED Medications - No data to display  ED Course/ Medical Decision Making/ A&P                           Medical Decision Making Amount and/or Complexity of Data Reviewed Radiology: ordered.   Is an overall well-appearing 41 year old female with past medical history significant for significant obesity who presents with concern for right knee pain, difficulty with bending knee for the last few days.  Patient does not recall specific injury but does report ports that pain started after her birthday party, and she may have been on the leg more than usual.  She denies any numbness, tingling, and is neurovascularly intact on my exam.  She has completely intact passive range of motion of the right knee with some pain, as well as no evidence of collateral ligament laxity, joint effusion, signs of septic arthritis, gout.  She does have decreased active range of motion, however I think that this is more of a mental block than a true inability to bend the knee as patient is able to bear weight and walk without difficulty.  No signs of quadricep or patellar tendon rupture, normal situation of patella.  No significant crepitus on range of motion of the knee.  I independently interpreted imaging including a film radiograph  of the right knee which shows no acute fracture, dislocation, or other abnormality. I agree with the radiologist interpretation.  Clinically patient with signs and symptoms of right knee pain/inflammation from injury which patient may or may not recall.  I have low clinical suspicion for septic arthritis, gout, pseudogout, collateral ligament rupture or partial tear, patellar injury or dislocation.  Encouraged ibuprofen, Tylenol, compression, and encouraged weightbearing and motion as tolerated.  Patient instructed not to leave the knee in a locked extended position as it is likely to worsen her symptoms due to atrophy and tightness.  Patient  discharged in stable condition at this time, return precautions given.  Final Clinical Impression(s) / ED Diagnoses Final diagnoses:  Acute pain of right knee    Rx / DC Orders ED Discharge Orders     None         Anselmo Pickler, PA-C 07/17/21 2306    Lorelle Gibbs, DO 07/17/21 2336

## 2021-07-17 NOTE — ED Notes (Signed)
Patient transported to X-ray 

## 2021-07-17 NOTE — ED Triage Notes (Signed)
Pt with right knee pain since Friday, not able to bend knee.  Pain around and behind the knee as well. Hurts to put weight on it. Denies any injury.

## 2021-07-17 NOTE — Discharge Instructions (Addendum)
Please use Tylenol or ibuprofen for pain.  You may use 600 mg ibuprofen every 6 hours or 1000 mg of Tylenol every 6 hours.  You may choose to alternate between the 2.  This would be most effective.  Not to exceed 4 g of Tylenol within 24 hours.  Not to exceed 3200 mg ibuprofen 24 hours.  Recommend rest, ice, compression, elevation of the affected extremity.  As we discussed I do not see any evidence of fracture, dislocation, have low clinical suspicion for ligament tear or rupture.  I think that you have some injury that caused some inflammation and pain of the right knee.  I do recommend that you continue to try to bend it as there is nothing mechanically preventing you from pending it other than the pain.  Keeping it in a rigid locked position is likely to make the injury worse.

## 2021-07-24 DIAGNOSIS — M25561 Pain in right knee: Secondary | ICD-10-CM | POA: Diagnosis not present

## 2021-08-01 ENCOUNTER — Other Ambulatory Visit (HOSPITAL_COMMUNITY): Payer: Self-pay | Admitting: Orthopedic Surgery

## 2021-08-01 ENCOUNTER — Other Ambulatory Visit: Payer: Self-pay | Admitting: Orthopedic Surgery

## 2021-08-01 DIAGNOSIS — M25561 Pain in right knee: Secondary | ICD-10-CM

## 2021-08-02 DIAGNOSIS — F319 Bipolar disorder, unspecified: Secondary | ICD-10-CM | POA: Diagnosis not present

## 2021-08-02 DIAGNOSIS — I10 Essential (primary) hypertension: Secondary | ICD-10-CM | POA: Diagnosis not present

## 2021-08-02 DIAGNOSIS — E1169 Type 2 diabetes mellitus with other specified complication: Secondary | ICD-10-CM | POA: Diagnosis not present

## 2021-08-10 ENCOUNTER — Ambulatory Visit (HOSPITAL_COMMUNITY): Admission: RE | Admit: 2021-08-10 | Payer: Medicaid Other | Source: Ambulatory Visit

## 2021-08-10 ENCOUNTER — Encounter (HOSPITAL_COMMUNITY): Payer: Self-pay

## 2021-09-26 ENCOUNTER — Emergency Department (HOSPITAL_COMMUNITY)
Admission: EM | Admit: 2021-09-26 | Discharge: 2021-09-26 | Disposition: A | Discharge status: Home / Self Care | Payer: Medicaid Other | Attending: Emergency Medicine | Admitting: Emergency Medicine

## 2021-09-26 ENCOUNTER — Other Ambulatory Visit: Payer: Self-pay

## 2021-09-26 ENCOUNTER — Encounter (HOSPITAL_COMMUNITY): Payer: Self-pay

## 2021-09-26 DIAGNOSIS — Z91040 Latex allergy status: Secondary | ICD-10-CM | POA: Insufficient documentation

## 2021-09-26 DIAGNOSIS — K0889 Other specified disorders of teeth and supporting structures: Secondary | ICD-10-CM | POA: Diagnosis not present

## 2021-09-26 DIAGNOSIS — K029 Dental caries, unspecified: Secondary | ICD-10-CM | POA: Insufficient documentation

## 2021-09-26 MED ORDER — CLINDAMYCIN HCL 150 MG PO CAPS: 150.0000 mg | ORAL_CAPSULE | Freq: Four times a day (QID) | ORAL | 0 refills | Status: DC

## 2021-09-26 MED ORDER — CLINDAMYCIN HCL 150 MG PO CAPS
300.0000 mg | ORAL_CAPSULE | Freq: Once | ORAL | Status: AC
  Administered 2021-09-26: 300 mg via ORAL
  Filled 2021-09-26: qty 2

## 2021-09-26 MED ORDER — TRAMADOL HCL 50 MG PO TABS
100.0000 mg | ORAL_TABLET | Freq: Once | ORAL | Status: AC
  Administered 2021-09-26: 100 mg via ORAL
  Filled 2021-09-26: qty 2

## 2021-09-26 MED ORDER — TRAMADOL HCL 50 MG PO TABS: 50.0000 mg | ORAL_TABLET | Freq: Four times a day (QID) | ORAL | 0 refills | Status: DC | PRN

## 2021-09-26 NOTE — ED Provider Notes (Signed)
[facility] EMERGENCY DEPARTMENT Provider Note   CSN: [id number] Arrival date & time: 09/26/21  2044     History {Add pertinent medical, surgical, social history, OB history to HPI:1} Chief Complaint  Patient presents with   Facial Pain    Breanna Malone is a 41 y.o. adult.  She is complaining of right upper tooth pain that started 2 days ago.  She rates the pain as severe and radiates to her right ear and neck and face.  No known dental trauma.  She has tried ibuprofen without any improvement.  No difficulty swallowing.  The history is provided by the patient.  Dental Pain Location:  Upper Upper teeth location:  4/RU 2nd bicuspid and 3/RU 1st molar Quality:  Throbbing Severity:  Severe Onset quality:  Gradual Duration:  2 days Timing:  Constant Progression:  Unchanged Chronicity:  Recurrent Context: poor dentition   Relieved by:  Nothing Worsened by:  Cold food/drink, jaw movement and hot food/drink Ineffective treatments:  NSAIDs Associated symptoms: facial pain   Associated symptoms: no difficulty swallowing, no fever and no trismus   Risk factors: lack of dental care        Home Medications Prior to Admission medications   Medication Sig Start Date End Date Taking? Authorizing Provider  gabapentin (NEURONTIN) 300 MG capsule TAKE 1 CAPSULE BY MOUTH THREE TIMES A DAY. 05/07/21   Melrose Nakayama, MD  hydrochlorothiazide (HYDRODIURIL) 12.5 MG tablet Take 6.25 mg by mouth daily.    [provider]  ibuprofen (ADVIL) 200 MG tablet Take 400 mg by mouth every 6 (six) hours as needed for moderate pain or headache.    [provider]  lisinopril (ZESTRIL) 5 MG tablet Take 5 mg by mouth daily. 01/18/21   [provider]  metFORMIN (GLUCOPHAGE) 500 MG tablet Take 500 mg by mouth 2 (two) times daily with a meal.    [provider]  pravastatin (PRAVACHOL) 40 MG tablet Take 40 mg by mouth 3 (three) times a week.    [provider]   PROAIR HFA 108 [phone] Base) MCG/ACT inhaler Inhale 1-2 puffs into the lungs every 6 (six) hours as needed for wheezing. 02/15/20   [provider]      Allergies    Darvocet [propoxyphene n-acetaminophen], Hydrocodone-acetaminophen, Lactose intolerance (gi), Other, Penicillins, Latex, and Percocet [oxycodone-acetaminophen]    Review of Systems   Review of Systems  Constitutional:  Negative for fever.  HENT:  Positive for dental problem.     Physical Exam Updated Vital Signs BP (!) 175/87 (BP Location: Right Arm)   Pulse 72   Temp 100.2 F (37.9 C) (Oral)   Resp 19   Ht '5\' 4"'$  (1.626 m)   Wt 102.1 kg   LMP 09/05/2021   SpO2 96%   BMI 38.64 kg/m  Physical Exam Constitutional:      Appearance: Normal appearance. She is well-developed.  HENT:     Head: Normocephalic and atraumatic.     Right Ear: Tympanic membrane normal.     Left Ear: Tympanic membrane normal.     Nose: Nose normal.     Mouth/Throat:     Mouth: Mucous membranes are moist.     Dentition: Dental tenderness and dental caries present. No dental abscesses.     Pharynx: Oropharynx is clear. Uvula midline.     Tonsils: No tonsillar exudate or tonsillar abscesses.     Comments: No trismus Eyes:     Conjunctiva/sclera: Conjunctivae normal.  Cardiovascular:  Rate and Rhythm: Normal rate and regular rhythm.  Pulmonary:     Effort: Pulmonary effort is normal.     Breath sounds: Normal breath sounds.  Musculoskeletal:     Cervical back: Full passive range of motion without pain and neck supple. No tenderness.  Skin:    General: Skin is warm and dry.  Neurological:     Mental Status: She is alert.     GCS: GCS eye subscore is 4. GCS verbal subscore is 5. GCS motor subscore is 6.     ED Results / Procedures / Treatments   Labs (all labs ordered are listed, but only abnormal results are displayed) Labs Reviewed - No data to display  EKG None  Radiology No results  found.  Procedures Procedures  {Document cardiac monitor, telemetry assessment procedure when appropriate:1}  Medications Ordered in ED Medications  clindamycin (CLEOCIN) capsule 300 mg (has no administration in time range)  traMADol (ULTRAM) tablet 100 mg (has no administration in time range)    ED Course/ Medical Decision Making/ A&P                           Medical Decision Making Risk Prescription drug management.   ***  {Document critical care time when appropriate:1} {Document review of labs and clinical decision tools ie heart score, Chads2Vasc2 etc:1}  {Document your independent review of radiology images, and any outside records:1} {Document your discussion with family members, caretakers, and with consultants:1} {Document social determinants of health affecting pt's care:1} {Document your decision making why or why not admission, treatments were needed:1} Final Clinical Impression(s) / ED Diagnoses Final diagnoses:  None    Rx / DC Orders ED Discharge Orders     None

## 2021-09-26 NOTE — ED Triage Notes (Signed)
Pov from home cc of right face pain for 3 days . States she has a cavity on the top, right side that has lead to swelling of her face, neck, and has ear pain.  Hurts for her to talk and eat. Difficulty sleeping Around 6pm took a "pain pill" and tried Orajel.

## 2021-10-03 ENCOUNTER — Encounter: Payer: Medicaid Other | Admitting: Thoracic Surgery (Cardiothoracic Vascular Surgery)

## 2021-10-27 ENCOUNTER — Ambulatory Visit: Payer: Medicaid Other | Admitting: Adult Health

## 2021-11-02 ENCOUNTER — Encounter (HOSPITAL_COMMUNITY): Payer: Self-pay | Admitting: Emergency Medicine

## 2021-11-02 ENCOUNTER — Other Ambulatory Visit: Payer: Self-pay

## 2021-11-02 ENCOUNTER — Emergency Department (HOSPITAL_COMMUNITY): Payer: Medicaid Other

## 2021-11-02 ENCOUNTER — Emergency Department (HOSPITAL_COMMUNITY)
Admission: EM | Admit: 2021-11-02 | Discharge: 2021-11-02 | Disposition: A | Discharge status: Home / Self Care | Payer: Medicaid Other | Attending: Emergency Medicine | Admitting: Emergency Medicine

## 2021-11-02 DIAGNOSIS — Z91040 Latex allergy status: Secondary | ICD-10-CM | POA: Insufficient documentation

## 2021-11-02 DIAGNOSIS — R0789 Other chest pain: Secondary | ICD-10-CM | POA: Diagnosis not present

## 2021-11-02 DIAGNOSIS — R079 Chest pain, unspecified: Secondary | ICD-10-CM | POA: Diagnosis not present

## 2021-11-02 LAB — CBC
HCT: 37.4 % (ref 36.0–46.0)
Hemoglobin: 12 g/dL (ref 12.0–15.0)
MCH: 31.1 pg (ref 26.0–34.0)
MCHC: 32.1 g/dL (ref 30.0–36.0)
MCV: 96.9 fL (ref 80.0–100.0)
Platelets: 311 10*3/uL (ref 150–400)
RBC: 3.86 MIL/uL — ABNORMAL LOW (ref 3.87–5.11)
RDW: 11.9 % (ref 11.5–15.5)
WBC: 7.4 10*3/uL (ref 4.0–10.5)
nRBC: 0 % (ref 0.0–0.2)

## 2021-11-02 LAB — BASIC METABOLIC PANEL
Anion gap: 7 (ref 5–15)
BUN: 9 mg/dL (ref 6–20)
CO2: 27 mmol/L (ref 22–32)
Calcium: 9.1 mg/dL (ref 8.9–10.3)
Chloride: 104 mmol/L (ref 98–111)
Creatinine, Ser: 0.74 mg/dL (ref 0.44–1.00)
GFR, Estimated: >60 mL/min (ref >60)
Glucose, Bld: 118 mg/dL — ABNORMAL HIGH (ref 70–99)
Potassium: 3.6 mmol/L (ref 3.5–5.1)
Sodium: 138 mmol/L (ref 135–145)

## 2021-11-02 LAB — TROPONIN I (HIGH SENSITIVITY)
Troponin I (High Sensitivity): 3 ng/L (ref <18)
Troponin I (High Sensitivity): 3 ng/L (ref <18)

## 2021-11-02 MED ORDER — ALUM & MAG HYDROXIDE-SIMETH 200-200-20 MG/5ML PO SUSP
30.0000 mL | Freq: Once | ORAL | Status: AC
  Administered 2021-11-02: 30 mL via ORAL
  Filled 2021-11-02: qty 30

## 2021-11-02 NOTE — Discharge Instructions (Signed)
I would like for you to follow-up with your primary care doctor for further evaluation.  As we discussed, this is not likely coming from your heart or lungs.  Please return to the emergency department for any worsening symptoms you might have.

## 2021-11-02 NOTE — ED Provider Notes (Signed)
[facility] EMERGENCY DEPARTMENT Provider Note   CSN: [id number] Arrival date & time: 11/02/21  1109     History Chief Complaint  Patient presents with   Chest Pain    Breanna Malone is a 41 y.o. adult patient presents to the emergency department today for further evaluation of substernal sharp chest pain with been intermittent since yesterday.  Patient states she has had this before and was relieved with Gas-X.  She denies any shortness of breath, nausea, vomiting, diarrhea.  She does not take any hormone replacement therapy, has not had any recent surgeries, or recent travel.  Denies any family history of cardiac disease or heart attack at early age.   Chest Pain      Home Medications Prior to Admission medications   Medication Sig Start Date End Date Taking? Authorizing Provider  clindamycin (CLEOCIN) 150 MG capsule Take 1 capsule (150 mg total) by mouth every 6 (six) hours. 09/26/21   Hayden Rasmussen, MD  gabapentin (NEURONTIN) 300 MG capsule TAKE 1 CAPSULE BY MOUTH THREE TIMES A DAY. 05/07/21   Melrose Nakayama, MD  hydrochlorothiazide (HYDRODIURIL) 12.5 MG tablet Take 6.25 mg by mouth daily.    [provider]  ibuprofen (ADVIL) 200 MG tablet Take 400 mg by mouth every 6 (six) hours as needed for moderate pain or headache.    [provider]  lisinopril (ZESTRIL) 5 MG tablet Take 5 mg by mouth daily. 01/18/21   [provider]  metFORMIN (GLUCOPHAGE) 500 MG tablet Take 500 mg by mouth 2 (two) times daily with a meal.    [provider]  pravastatin (PRAVACHOL) 40 MG tablet Take 40 mg by mouth 3 (three) times a week.    [provider]  PROAIR HFA 108 [phone] Base) MCG/ACT inhaler Inhale 1-2 puffs into the lungs every 6 (six) hours as needed for wheezing. 02/15/20   [provider]  traMADol (ULTRAM) 50 MG tablet Take 1 tablet (50 mg total) by mouth every 6 (six) hours as needed. 09/26/21   Hayden Rasmussen, MD       Allergies    Darvocet [propoxyphene n-acetaminophen], Hydrocodone-acetaminophen, Lactose intolerance (gi), Other, Penicillins, Latex, and Percocet [oxycodone-acetaminophen]    Review of Systems   Review of Systems  Cardiovascular:  Positive for chest pain.  All other systems reviewed and are negative.   Physical Exam Updated Vital Signs BP 130/77 (BP Location: Left Arm)   Pulse 78   Temp 98 F (36.7 C) (Oral)   Resp 20   Ht '5\' 4"'$  (1.626 m)   Wt 102.1 kg   LMP 10/30/2021   SpO2 94%   BMI 38.64 kg/m  Physical Exam Vitals and nursing note reviewed.  Constitutional:      General: She is not in acute distress.    Appearance: Normal appearance.  HENT:     Head: Normocephalic and atraumatic.  Eyes:     General:        Right eye: No discharge.        Left eye: No discharge.  Cardiovascular:     Comments: Regular rate and rhythm.  S1/S2 are distinct without any evidence of murmur, rubs, or gallops.  Radial pulses are 2+ bilaterally.  Dorsalis pedis pulses are 2+ bilaterally.  No evidence of pedal edema. Pulmonary:     Comments: Clear to auscultation bilaterally.  Normal effort.  No respiratory distress.  No evidence of wheezes, rales, or rhonchi heard throughout. Abdominal:  General: Abdomen is flat. Bowel sounds are normal. There is no distension.     Tenderness: There is no abdominal tenderness. There is no guarding or rebound.  Musculoskeletal:        General: Normal range of motion.     Cervical back: Neck supple.  Skin:    General: Skin is warm and dry.     Findings: No rash.  Neurological:     General: No focal deficit present.     Mental Status: She is alert.  Psychiatric:        Mood and Affect: Mood normal.        Behavior: Behavior normal.     ED Results / Procedures / Treatments   Labs (all labs ordered are listed, but only abnormal results are displayed) Labs Reviewed  BASIC METABOLIC PANEL - Abnormal; Notable for the following components:       Result Value   Glucose, Bld 118 (*)    All other components within normal limits  CBC - Abnormal; Notable for the following components:   RBC 3.86 (*)    All other components within normal limits  POC URINE PREG, ED  TROPONIN I (HIGH SENSITIVITY)  TROPONIN I (HIGH SENSITIVITY)    EKG None  Radiology DG Chest 2 View  Result Date: 11/02/2021 CLINICAL DATA:  Mid sternal chest pain since last night EXAM: CHEST - 2 VIEW COMPARISON:  07/04/2021 FINDINGS: Normal heart size, mediastinal contours, and pulmonary vascularity. Chronic blunting of LEFT costophrenic angle, stable. Lungs otherwise clear. No pleural effusion or pneumothorax. Bones unremarkable. IMPRESSION: Minimal LEFT basilar scarring, stable. No acute abnormalities. Electronically Signed   By: Lavonia Dana M.D.   On: 11/02/2021 11:59    Procedures Procedures   Medications Ordered in ED Medications  alum & mag hydroxide-simeth (MAALOX/MYLANTA) 200-200-20 MG/5ML suspension 30 mL (30 mLs Oral Given 11/02/21 1248)    ED Course/ Medical Decision Making/ A&P Clinical Course as of 11/02/21 1454  Thu Nov 02, 2021  1359 Troponin I (High Sensitivity) Initial delta troponin are normal. [CF]  1453 CBC(!) Normal. [CF]  9147 Basic metabolic panel(!) Normal. [CF]  1453 DG Chest 2 View I personally ordered and interpreted a chest x-ray and do not see any evidence of pneumonia or pleural effusion. [CF]  8295 On repeat evaluation, patient feeling better after GI cocktail.  This is likely GI related given the findings on the labs. [CF]    Clinical Course User Index [CF] Hendricks Limes, PA-C                           Medical Decision Making Breanna Malone is a 41 y.o. adult patient who presents to the emergency department today for further evaluation of chest pain.  I have a low suspicion at this time for pulmonary embolism, dissection, or ACS.  EKG is without significant signs of ST elevation or depression.  I will get cardiac labs, try  a GI cocktail, and plan to reassess.  Vital signs are all normal. She is resting comfortably in the ED in no acute distress.   As highlighted in ED course, patient feeling better after GI cocktail.  Troponins were negative x2.  EKG without significant signs of ST elevation or depression.  Patient will go home.  This is reasonable.  Vital signs normal.  Safe for discharge at this time.  Strict return precautions were discussed.  Amount and/or Complexity of Data Reviewed Labs: ordered. Decision-making  details documented in ED Course. Radiology: ordered. Decision-making details documented in ED Course.  Risk OTC drugs.   Final Clinical Impression(s) / ED Diagnoses Final diagnoses:  Chest pain, unspecified type    Rx / DC Orders ED Discharge Orders     None         Hendricks Limes, [place] 11/02/21 1454    Milton Ferguson, MD 11/03/21 1020

## 2021-11-02 NOTE — ED Notes (Signed)
Pt ambulated to bathroom with wteady gait. Pt states she she feeling better and ready to go home.

## 2021-11-02 NOTE — ED Triage Notes (Signed)
Pt presents with mid-sternal chest pain since last night.

## 2021-12-04 ENCOUNTER — Telehealth: Payer: Self-pay

## 2021-12-04 NOTE — Telephone Encounter (Signed)
Patient contacted the office requesting appointment to see Dr. Roxan Hockey. 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  She states that it is "sharp" when described and like a "knife". She is afraid that the mass has "come back".She states that she "ran out" of gabapentin that she was taking after discharge to help with neuropathic pain. She is not taking any medication for this pain. She was due to see Dr. Roxan Hockey back in the clinic 09/2021 but did not show to the appointment. Patient states that she was unaware of the follow up. She last saw Dr. Roxan Hockey in the office 06/2021. Appointment made. Patient acknowledged receipt.

## 2021-12-06 DIAGNOSIS — E119 Type 2 diabetes mellitus without complications: Secondary | ICD-10-CM | POA: Diagnosis not present

## 2021-12-12 ENCOUNTER — Ambulatory Visit: Payer: Medicaid Other | Admitting: Thoracic Surgery (Cardiothoracic Vascular Surgery)

## 2021-12-13 ENCOUNTER — Other Ambulatory Visit (HOSPITAL_COMMUNITY): Payer: Self-pay | Admitting: Internal Medicine

## 2021-12-13 ENCOUNTER — Encounter: Payer: Self-pay | Admitting: Internal Medicine

## 2021-12-13 ENCOUNTER — Ambulatory Visit: Payer: 59 | Admitting: Internal Medicine

## 2021-12-13 VITALS — BP 126/84 | HR 73 | Resp 16 | Ht 64.0 in | Wt 235.0 lb

## 2021-12-13 DIAGNOSIS — Z1231 Encounter for screening mammogram for malignant neoplasm of breast: Secondary | ICD-10-CM

## 2021-12-13 DIAGNOSIS — I152 Hypertension secondary to endocrine disorders: Secondary | ICD-10-CM | POA: Diagnosis not present

## 2021-12-13 DIAGNOSIS — E1159 Type 2 diabetes mellitus with other circulatory complications: Secondary | ICD-10-CM | POA: Diagnosis not present

## 2021-12-13 DIAGNOSIS — E782 Mixed hyperlipidemia: Secondary | ICD-10-CM

## 2021-12-13 DIAGNOSIS — I10 Essential (primary) hypertension: Secondary | ICD-10-CM

## 2021-12-13 DIAGNOSIS — Z1159 Encounter for screening for other viral diseases: Secondary | ICD-10-CM

## 2021-12-13 DIAGNOSIS — E785 Hyperlipidemia, unspecified: Secondary | ICD-10-CM

## 2021-12-13 DIAGNOSIS — D169 Benign neoplasm of bone and articular cartilage, unspecified: Secondary | ICD-10-CM

## 2021-12-13 DIAGNOSIS — Z131 Encounter for screening for diabetes mellitus: Secondary | ICD-10-CM

## 2021-12-13 DIAGNOSIS — E1169 Type 2 diabetes mellitus with other specified complication: Secondary | ICD-10-CM

## 2021-12-13 MED ORDER — GABAPENTIN 300 MG PO CAPS: ORAL_CAPSULE | ORAL | 1 refills | Status: DC

## 2021-12-13 MED ORDER — HYDROCHLOROTHIAZIDE 12.5 MG PO TABS
6.2500 mg | ORAL_TABLET | Freq: Every day | ORAL | 1 refills | Status: DC
Start: 2021-12-13 — End: 2021-12-15

## 2021-12-13 NOTE — Progress Notes (Signed)
CC: screening for diabetes mellitus , refill on blood pressure medication , pain at surgical scar  HPI:Ms.Breanna Malone is a 41 y.o. adult who presents for evaluation of prediabetes, hyperlipidemia, hypertension, need for screening mammogram, nedd for hepatitis C screening test, and pain at surgical scar. For the details of today's visit, please refer to the assessment and plan.   Past Medical History:  Diagnosis Date   Abnormal Pap smear    Arthritis    Asthma    Asthma    Bipolar 1 disorder ([place])    Bone spur    rib   Bronchitis    BV (bacterial vaginosis) 10/08/2019   8/19 rx flagyl   Carpal tunnel syndrome    Cervical spine pain    Chest pain    Diabetes mellitus without complication (HCC)    Elevated cholesterol    High cholesterol    Hyperlipidemia    Hyperlipoproteinemia    Hypertension    Post traumatic stress disorder    Right ovarian cyst 10/31/2012   Complex right ovarian cyst seen 8/18 in ER need f/u US   Tendonitis    Vaginal Pap smear, abnormal     Past Surgical History:  Procedure Laterality Date   CARPAL TUNNEL RELEASE Right 06/28/2016   Procedure: RIGHT CARPAL TUNNEL RELEASE;  Surgeon: Carole Civil, MD;  Location: [facility];  Service: Orthopedics;  Laterality: Right;   HERNIA REPAIR     umbilical   RIB RESECTION Left 03/16/2021   Procedure: RESECTION OF TUMOR LEFT NINTH RIB;  Surgeon: Melrose Nakayama, MD;  Location: [facility];  Service: Thoracic;  Laterality: Left;    Family History  Problem Relation Age of Onset   Diabetes Mother    Hypertension Mother    Asthma Father    Bronchitis Daughter    Asthma Daughter    Asthma Daughter    Bronchitis Daughter    Asthma Son    Bronchitis Son    Asthma Son    Stroke Paternal Grandmother    Heart attack Paternal Grandmother    Cancer Paternal Grandmother    Cancer Cousin    Asthma Other    Bronchitis Other     Social History   Tobacco Use   Smoking status: Never   Smokeless  tobacco: Never  Vaping Use   Vaping Use: Never used  Substance Use Topics   Alcohol use: No   Drug use: No    Review of Systems:    Review of Systems  Constitutional:  Negative for chills, fever, malaise/fatigue and weight loss.  Musculoskeletal:  Negative for falls.       Pain over left rib cage/surgical site  Psychiatric/Behavioral:  Negative for depression. The patient is not nervous/anxious.      Physical Exam: Vitals:   12/13/21 1001  BP: 126/84  Pulse: 73  Resp: 16  SpO2: 93%  Weight: 235 lb (106.6 kg)  Height: '5\' 4"'$  (1.626 m)    General: Well developed , obese Cardiovascular: Normal rate, regular rhythm.  No murmurs, rubs, or gallops Pulmonary : Effort normal, breath sounds normal. No noticeable pain with deep breathing and equal expansion Skin: large scar over left rib cage, no erythema  Psychiatric/Behavioral:  happy mood, normal behavior      Assessment & Plan:   Enchondroma of bone Patient is having increase pain at surgical site/ 9th left rib. No trauma to area. Reviewed CT surgeon records and some pain was expected given nature  of surgery. Patient was able to be weaned off opioid pain medication and on Gabapentin for neuropathic pain. She ran out of Gabapentin.  Patient is scheduled for follow up on 10/31 with [facility] for surveillance.   - gabapentin (NEURONTIN) 300 MG capsule; TAKE 1 CAPSULE BY MOUTH THREE TIMES A DAY.  Dispense: 90 capsule; Refill: 1   Screening for diabetes mellitus Hgb A1c 6.3 % at the last visit. Patient was on Metformin 500 mg and stopped taking. She had stomach pains and loose bowel movements.   Assessment/Plan: Patient has history of prediabetes. Last checked in January, will check hemoglobin A1c today. If >6.5 will try metformin extended release as it is better tolerated. If not tolerating GLP-1 would be a good option to also help with weight loss.   Morbid obesity (HCC) BMI 40. Complications of HLD, HTN, and prediabetes.  She is motivated to lose weight. Encourage patient to increase activity and needs calorie deficit. Placed Amb Referral To Provider Referral Exercise Program (P.R.E.P). Follow up on progress at next visit.   Hyperlipidemia Patient with known HLD. Patient is currently on pravastatin 40 mg three times weekly. Last lipid panel on 07/27/20 showing total cholesterol of 213, LDL of 159, and HLD of 43. Denies myalgias. Will repeat lipid panel today. Plan to calculate ASCVD risk score base on results and prescribe treatment according to results.   Addendum:  Non-fasting Lipid Panel     Component Value Date/Time   CHOL 209 (H) 12/13/2021 1045   TRIG 152 (H) 12/13/2021 1045   HDL 39 (L) 12/13/2021 1045   CHOLHDL 5.4 (H) 12/13/2021 1045   CHOLHDL 5.7 01/26/2014 1004   VLDL 41 (H) 01/26/2014 1004   LDLCALC 142 (H) 12/13/2021 1045   LABVLDL 28 12/13/2021 1045    The 10-year ASCVD risk score (Arnett DK, et al., 2019) is: 8.9% This is intermediate risk and LDL < 160   Continue Pravastatin 40 mg. Encourage mediterranean diet and/or avoidance of trans fats.  Hypertension Patient's BP today is 126/84 with a goal of <140/80. The patient endorses adherence to lisinopril (5 mg) and HCTZ 6.25 (mg) daily. Discussed decreasing pill burden. Stop current medications. Start Olmesartan 20 mg daily. Check BP and BMP at follow up in 2-4 weeks.     Lorene Dy, MD

## 2021-12-13 NOTE — Patient Instructions (Signed)
Thank you for trusting me with your care. To recap, today we discussed the following:  Screening for diabetes mellitus - Hemoglobin A1c - Referral for exercise program  High Cholesterol - Lipid Profile  Hypertension  - hydrochlorothiazide (HYDRODIURIL) 12.5 MG tablet; Take 0.5 tablets (6.25 mg total) by mouth daily.  Dispense: 90 tablet; Refill: 1 - Basic Metabolic Panel (BMET)  Screening mammogram for breast cancer - MM Digital Screening; Future  Need for hepatitis C screening test - Hepatitis C antibody

## 2021-12-14 LAB — HEPATITIS C ANTIBODY: Hep C Virus Ab: NONREACTIVE

## 2021-12-14 LAB — LIPID PANEL
Chol/HDL Ratio: 5.4 ratio — ABNORMAL HIGH (ref 0.0–4.4)
Cholesterol, Total: 209 mg/dL — ABNORMAL HIGH (ref 100–199)
HDL: 39 mg/dL — ABNORMAL LOW (ref >39)
LDL Chol Calc (NIH): 142 mg/dL — ABNORMAL HIGH (ref 0–99)
Triglycerides: 152 mg/dL — ABNORMAL HIGH (ref 0–149)
VLDL Cholesterol Cal: 28 mg/dL (ref 5–40)

## 2021-12-14 LAB — BASIC METABOLIC PANEL
BUN/Creatinine Ratio: 11 (ref 9–23)
BUN: 10 mg/dL (ref 6–24)
CO2: 24 mmol/L (ref 20–29)
Calcium: 9.4 mg/dL (ref 8.7–10.2)
Chloride: 101 mmol/L (ref 96–106)
Creatinine, Ser: 0.94 mg/dL (ref 0.57–1.00)
Glucose: 93 mg/dL (ref 70–99)
Potassium: 4.4 mmol/L (ref 3.5–5.2)
Sodium: 138 mmol/L (ref 134–144)
eGFR: 77 mL/min/{1.73_m2} (ref >59)

## 2021-12-14 LAB — HEMOGLOBIN A1C
Est. average glucose Bld gHb Est-mCnc: 134 mg/dL
Hgb A1c MFr Bld: 6.3 % — ABNORMAL HIGH (ref 4.8–5.6)

## 2021-12-15 ENCOUNTER — Telehealth: Payer: Self-pay | Admitting: *Deleted

## 2021-12-15 DIAGNOSIS — E785 Hyperlipidemia, unspecified: Secondary | ICD-10-CM | POA: Insufficient documentation

## 2021-12-15 DIAGNOSIS — I10 Essential (primary) hypertension: Secondary | ICD-10-CM | POA: Insufficient documentation

## 2021-12-15 MED ORDER — OLMESARTAN MEDOXOMIL 20 MG PO TABS: 20.0000 mg | ORAL_TABLET | Freq: Every day | ORAL | 1 refills | Status: DC

## 2021-12-15 NOTE — Assessment & Plan Note (Addendum)
BMI 40. Complications of HLD, HTN, and prediabetes. She is motivated to lose weight. Encourage patient to increase activity and needs calorie deficit. Placed Amb Referral To Provider Referral Exercise Program (P.R.E.P). Follow up on progress at next visit.

## 2021-12-15 NOTE — Assessment & Plan Note (Addendum)
Patient's BP today is 126/84 with a goal of <140/80. The patient endorses adherence to lisinopril (5 mg) and HCTZ 6.25 (mg) daily. Discussed decreasing pill burden. Stop current medications. Start Olmesartan 20 mg daily. Check BP and BMP at follow up in 2-4 weeks.

## 2021-12-15 NOTE — Assessment & Plan Note (Signed)
Patient is having increase pain at surgical site/ 9th left rib. No trauma to area. Reviewed CT surgeon records and some pain was expected given nature of surgery. Patient was able to be weaned off opioid pain medication and on Gabapentin for neuropathic pain. She ran out of Gabapentin.  Patient is scheduled for follow up on 10/31 with [facility] for surveillance.   - gabapentin (NEURONTIN) 300 MG capsule; TAKE 1 CAPSULE BY MOUTH THREE TIMES A DAY.  Dispense: 90 capsule; Refill: 1

## 2021-12-15 NOTE — Assessment & Plan Note (Addendum)
Patient with known HLD. Patient is currently on pravastatin 40 mg three times weekly. Last lipid panel on 07/27/20 showing total cholesterol of 213, LDL of 159, and HLD of 43. Denies myalgias. Will repeat lipid panel today. Plan to calculate ASCVD risk score base on results and prescribe treatment according to results.   Addendum:  Non-fasting Lipid Panel     Component Value Date/Time   CHOL 209 (H) 12/13/2021 1045   TRIG 152 (H) 12/13/2021 1045   HDL 39 (L) 12/13/2021 1045   CHOLHDL 5.4 (H) 12/13/2021 1045   CHOLHDL 5.7 01/26/2014 1004   VLDL 41 (H) 01/26/2014 1004   LDLCALC 142 (H) 12/13/2021 1045   LABVLDL 28 12/13/2021 1045    The 10-year ASCVD risk score (Arnett DK, et al., 2019) is: 8.9% This is intermediate risk and LDL < 160   Continue Pravastatin 40 mg. Encourage mediterranean diet and/or avoidance of trans fats.

## 2021-12-15 NOTE — Assessment & Plan Note (Addendum)
Hgb A1c 6.3 % at the last visit. Patient was on Metformin 500 mg and stopped taking. She had stomach pains and loose bowel movements.   Assessment/Plan: Patient has history of prediabetes. Last checked in January, will check hemoglobin A1c today. If >6.5 will try metformin extended release as it is better tolerated. If not tolerating GLP-1 would be a good option to also help with weight loss.

## 2021-12-15 NOTE — Telephone Encounter (Signed)
Contacted regarding PREP Class referral. No voice mail available.

## 2021-12-19 ENCOUNTER — Ambulatory Visit: Payer: Medicaid Other | Admitting: Thoracic Surgery (Cardiothoracic Vascular Surgery)

## 2021-12-27 ENCOUNTER — Ambulatory Visit: Payer: 59 | Admitting: Internal Medicine

## 2021-12-27 ENCOUNTER — Encounter: Payer: Self-pay | Admitting: Internal Medicine

## 2021-12-27 VITALS — BP 128/81 | HR 85 | Resp 16 | Ht 64.0 in | Wt 238.0 lb

## 2021-12-27 DIAGNOSIS — I10 Essential (primary) hypertension: Secondary | ICD-10-CM

## 2021-12-27 DIAGNOSIS — F329 Major depressive disorder, single episode, unspecified: Secondary | ICD-10-CM | POA: Insufficient documentation

## 2021-12-27 DIAGNOSIS — J4520 Mild intermittent asthma, uncomplicated: Secondary | ICD-10-CM

## 2021-12-27 DIAGNOSIS — Z23 Encounter for immunization: Secondary | ICD-10-CM | POA: Diagnosis not present

## 2021-12-27 DIAGNOSIS — F339 Major depressive disorder, recurrent, unspecified: Secondary | ICD-10-CM

## 2021-12-27 MED ORDER — BUPROPION HCL ER (XL) 150 MG PO TB24: 150.0000 mg | ORAL_TABLET | Freq: Every day | ORAL | 1 refills | Status: DC

## 2021-12-27 MED ORDER — PROAIR HFA 108 (90 BASE) MCG/ACT IN AERS: 1.0000 | INHALATION_SPRAY | Freq: Four times a day (QID) | RESPIRATORY_TRACT | 1 refills | Status: DC | PRN

## 2021-12-27 NOTE — Patient Instructions (Addendum)
Thank you for trusting me with your care. To recap, today we discussed the following:  Depression - buPROPion (WELLBUTRIN XL) 150 MG 24 hr tablet; Take 1 tablet (150 mg total) by mouth daily.  Dispense: 30 tablet; Refill: 1  Asthma - PROAIR HFA 108 (90 Base) MCG/ACT inhaler; Inhale 1-2 puffs into the lungs every 6 (six) hours as needed for wheezing.  Dispense: 18 g; Refill: 1  Primary hypertension - Microalbumin / creatinine urine ratio   Follow up with Dr.Hendrickson  I will contact PREP program - They will be calling you.

## 2021-12-27 NOTE — Progress Notes (Unsigned)
     CC: Anxiety (2 week follow up )    HPI:Breanna Malone is a 41 y.o. adult who presents for evaluation of anxiety, depression , asthma, and hypertension. For the details of today's visit, please refer to the assessment and plan.   Past Medical History:  Diagnosis Date   Abnormal Pap smear    Arthritis    Asthma    Asthma    Bipolar 1 disorder ([place])    Bone spur    rib   Bronchitis    BV (bacterial vaginosis) 10/08/2019   8/19 rx flagyl   Carpal tunnel syndrome    Cervical spine pain    Chest pain    Diabetes mellitus without complication (HCC)    Elevated cholesterol    High cholesterol    Hyperlipidemia    Hyperlipoproteinemia    Hypertension    Post traumatic stress disorder    Right ovarian cyst 10/31/2012   Complex right ovarian cyst seen 8/18 in ER need f/u US   Tendonitis    Vaginal Pap smear, abnormal      Physical Exam: Vitals:   12/27/21 1006  BP: 128/81  Pulse: 85  Resp: 16  SpO2: 94%  Weight: 238 lb (108 kg)  Height: '5\' 4"'$  (1.626 m)     Physical Exam Constitutional:      General: She is not in acute distress.    Appearance: She is not ill-appearing.  Cardiovascular:     Rate and Rhythm: Normal rate and regular rhythm.  Pulmonary:     Effort: Pulmonary effort is normal.     Breath sounds: Normal breath sounds. No wheezing.  Psychiatric:     Comments: Depressed mood, behavior nl, judgement nl , thought content nl      Assessment & Plan:   MDD (major depressive disorder) Patient presents with feeling of depression and anxiety. Two of her children have died. She has two children taken away and placed in foster care. Two older children live with her. She has feeling of guilt over her children's death and not being able to care for her children. She doesn't sleep well and sometimes sleeps too much. She has difficulty concentrating and believes she may have ADD. She never has had formal testing or had this diagnosis. She was given a  diagnosis. Chart review shows this was in 2010 , but I can not find other documents on psychiatric care. She has an appointment with psychiatry in a few weeks. I suggested we wait for formal evaluation, but patient would like to start some treatment. She denies any manic episodes or hypomania. She does not recall medications she was treated with in th past. Reports not taking them because they made her tired. Her symptoms are most consistent with major depressive disorder at this time.   Assessment/Plan: Major depressive disorder, active. Starting Wellbutrin. Follow up schedule 11/22, monitor for hypomania or mania. She will follow up with psychiatry appointment.   Hypertension Patient here for follow up. Has not picked up Olmesartan. BP normal today. She will pick up Olmesartan and has follow up on 11/22. BMP can be checked at this appointment.   Asthma Mild intermittent asthma. Refilled inhaler as below. - PROAIR HFA 108 (90 Base) MCG/ACT inhaler; Inhale 1-2 puffs into the lungs every 6 (six) hours as needed for wheezing.  Dispense: 18 g; Refill: 1      Lorene Dy, MD

## 2021-12-28 ENCOUNTER — Ambulatory Visit (HOSPITAL_COMMUNITY): Payer: Medicaid Other

## 2021-12-28 ENCOUNTER — Encounter: Payer: Self-pay | Admitting: Internal Medicine

## 2021-12-28 NOTE — Assessment & Plan Note (Signed)
Mild intermittent asthma. Refilled inhaler as below. - PROAIR HFA 108 (90 Base) MCG/ACT inhaler; Inhale 1-2 puffs into the lungs every 6 (six) hours as needed for wheezing.  Dispense: 18 g; Refill: 1

## 2021-12-28 NOTE — Assessment & Plan Note (Signed)
Patient here for follow up. Has not picked up Olmesartan. BP normal today. She will pick up Olmesartan and has follow up on 11/22. BMP can be checked at this appointment.

## 2021-12-28 NOTE — Assessment & Plan Note (Addendum)
Patient presents with feeling of depression and anxiety. Two of her children have died. She has two children taken away and placed in foster care. Two older children live with her. She has feeling of guilt over her children's death and not being able to care for her children. She doesn't sleep well and sometimes sleeps too much. She has difficulty concentrating and believes she may have ADD. She never has had formal testing or had this diagnosis. She was given a diagnosis. Chart review shows this was in 2010 , but I can not find other documents on psychiatric care. She has an appointment with psychiatry in a few weeks. I suggested we wait for formal evaluation, but patient would like to start some treatment. She denies any manic episodes or hypomania. She does not recall medications she was treated with in th past. Reports not taking them because they made her tired. Her symptoms are most consistent with major depressive disorder at this time.   Assessment/Plan: Major depressive disorder, active. Starting Wellbutrin. Follow up schedule 11/22, monitor for hypomania or mania. She will follow up with psychiatry appointment.

## 2021-12-30 LAB — MICROALBUMIN / CREATININE URINE RATIO
Creatinine, Urine: 230.9 mg/dL
Microalb/Creat Ratio: 20 mg/g creat (ref 0–29)
Microalbumin, Urine: 46.4 ug/mL

## 2022-01-01 ENCOUNTER — Telehealth: Payer: Self-pay | Admitting: *Deleted

## 2022-01-01 ENCOUNTER — Telehealth: Payer: Self-pay

## 2022-01-01 ENCOUNTER — Encounter: Payer: Self-pay | Admitting: Internal Medicine

## 2022-01-01 NOTE — Telephone Encounter (Signed)
Patient contacted the office again about sharp pain she has been having. This pain is like what she was having before surgical removal of the mass. She was on Gabapentin post-op but has not been taking it. She was unable to make it to multiple appointments and has been rescheduled one more time. Advised if she is unable to make this appointment to call the office and reschedule. Advised that a re-referral may need to be made if she is unable to make this appointment without rescheduling. She acknowledged receipt.

## 2022-01-01 NOTE — Telephone Encounter (Signed)
Contacted regarding PREP class referral. She is interested in participating in the class beginning December 5th at the [facility]. I will contact her to set up an appointment for her intake assessment.

## 2022-01-08 ENCOUNTER — Other Ambulatory Visit: Payer: Self-pay

## 2022-01-08 ENCOUNTER — Ambulatory Visit (INDEPENDENT_AMBULATORY_CARE_PROVIDER_SITE_OTHER): Payer: 59 | Admitting: Internal Medicine

## 2022-01-08 ENCOUNTER — Emergency Department (HOSPITAL_COMMUNITY): Payer: 59

## 2022-01-08 ENCOUNTER — Encounter (HOSPITAL_COMMUNITY): Payer: Self-pay | Admitting: *Deleted

## 2022-01-08 ENCOUNTER — Emergency Department (HOSPITAL_COMMUNITY)
Admission: EM | Admit: 2022-01-08 | Discharge: 2022-01-08 | Disposition: A | Discharge status: Home / Self Care | Payer: 59 | Attending: Emergency Medicine | Admitting: Emergency Medicine

## 2022-01-08 ENCOUNTER — Encounter: Payer: Self-pay | Admitting: Internal Medicine

## 2022-01-08 VITALS — BP 150/89 | HR 74 | Ht 64.0 in | Wt 239.1 lb

## 2022-01-08 DIAGNOSIS — Z760 Encounter for issue of repeat prescription: Secondary | ICD-10-CM | POA: Insufficient documentation

## 2022-01-08 DIAGNOSIS — S2242XD Multiple fractures of ribs, left side, subsequent encounter for fracture with routine healing: Secondary | ICD-10-CM | POA: Diagnosis not present

## 2022-01-08 DIAGNOSIS — Z91040 Latex allergy status: Secondary | ICD-10-CM | POA: Insufficient documentation

## 2022-01-08 DIAGNOSIS — D169 Benign neoplasm of bone and articular cartilage, unspecified: Secondary | ICD-10-CM | POA: Diagnosis not present

## 2022-01-08 DIAGNOSIS — R0789 Other chest pain: Secondary | ICD-10-CM | POA: Insufficient documentation

## 2022-01-08 DIAGNOSIS — G8918 Other acute postprocedural pain: Secondary | ICD-10-CM | POA: Insufficient documentation

## 2022-01-08 DIAGNOSIS — Z7984 Long term (current) use of oral hypoglycemic drugs: Secondary | ICD-10-CM | POA: Insufficient documentation

## 2022-01-08 DIAGNOSIS — I10 Essential (primary) hypertension: Secondary | ICD-10-CM | POA: Diagnosis not present

## 2022-01-08 DIAGNOSIS — R109 Unspecified abdominal pain: Secondary | ICD-10-CM | POA: Diagnosis not present

## 2022-01-08 DIAGNOSIS — R918 Other nonspecific abnormal finding of lung field: Secondary | ICD-10-CM | POA: Diagnosis not present

## 2022-01-08 DIAGNOSIS — J929 Pleural plaque without asbestos: Secondary | ICD-10-CM | POA: Diagnosis not present

## 2022-01-08 LAB — URINALYSIS, ROUTINE W REFLEX MICROSCOPIC
Bilirubin Urine: NEGATIVE
Glucose, UA: NEGATIVE mg/dL
Ketones, ur: NEGATIVE mg/dL
Nitrite: NEGATIVE
Protein, ur: 30 mg/dL — AB
Specific Gravity, Urine: 1.018 (ref 1.005–1.030)
pH: 6 (ref 5.0–8.0)

## 2022-01-08 LAB — COMPREHENSIVE METABOLIC PANEL
ALT: 43 U/L (ref 0–44)
AST: 35 U/L (ref 15–41)
Albumin: 4.1 g/dL (ref 3.5–5.0)
Alkaline Phosphatase: 65 U/L (ref 38–126)
Anion gap: 6 (ref 5–15)
BUN: 8 mg/dL (ref 6–20)
CO2: 25 mmol/L (ref 22–32)
Calcium: 9 mg/dL (ref 8.9–10.3)
Chloride: 106 mmol/L (ref 98–111)
Creatinine, Ser: 0.71 mg/dL (ref 0.44–1.00)
GFR, Estimated: >60 mL/min (ref >60)
Glucose, Bld: 97 mg/dL (ref 70–99)
Potassium: 3.8 mmol/L (ref 3.5–5.1)
Sodium: 137 mmol/L (ref 135–145)
Total Bilirubin: 0.4 mg/dL (ref 0.3–1.2)
Total Protein: 7.5 g/dL (ref 6.5–8.1)

## 2022-01-08 LAB — CBC WITH DIFFERENTIAL/PLATELET
Abs Immature Granulocytes: 0.03 10*3/uL (ref 0.00–0.07)
Basophils Absolute: 0 10*3/uL (ref 0.0–0.1)
Basophils Relative: 0 %
Eosinophils Absolute: 0.2 10*3/uL (ref 0.0–0.5)
Eosinophils Relative: 2 %
HCT: 37.8 % (ref 36.0–46.0)
Hemoglobin: 12.3 g/dL (ref 12.0–15.0)
Immature Granulocytes: 0 %
Lymphocytes Relative: 49 %
Lymphs Abs: 4.7 10*3/uL — ABNORMAL HIGH (ref 0.7–4.0)
MCH: 30.9 pg (ref 26.0–34.0)
MCHC: 32.5 g/dL (ref 30.0–36.0)
MCV: 95 fL (ref 80.0–100.0)
Monocytes Absolute: 0.6 10*3/uL (ref 0.1–1.0)
Monocytes Relative: 6 %
Neutro Abs: 4.2 10*3/uL (ref 1.7–7.7)
Neutrophils Relative %: 43 %
Platelets: 342 10*3/uL (ref 150–400)
RBC: 3.98 MIL/uL (ref 3.87–5.11)
RDW: 11.9 % (ref 11.5–15.5)
WBC: 9.8 10*3/uL (ref 4.0–10.5)
nRBC: 0 % (ref 0.0–0.2)

## 2022-01-08 LAB — LIPASE, BLOOD: Lipase: 37 U/L (ref 11–51)

## 2022-01-08 MED ORDER — KETOROLAC TROMETHAMINE 30 MG/ML IJ SOLN
30.0000 mg | Freq: Once | INTRAMUSCULAR | Status: AC
  Administered 2022-01-08: 30 mg via INTRAVENOUS
  Filled 2022-01-08: qty 1

## 2022-01-08 MED ORDER — IOHEXOL 300 MG/ML  SOLN
100.0000 mL | Freq: Once | INTRAMUSCULAR | Status: AC | PRN
  Administered 2022-01-08: 100 mL via INTRAVENOUS

## 2022-01-08 MED ORDER — GABAPENTIN 300 MG PO CAPS: 300.0000 mg | ORAL_CAPSULE | Freq: Three times a day (TID) | ORAL | 0 refills | Status: DC

## 2022-01-08 MED ORDER — SULFAMETHOXAZOLE-TRIMETHOPRIM 800-160 MG PO TABS: 1.0000 | ORAL_TABLET | Freq: Two times a day (BID) | ORAL | 0 refills | Status: AC

## 2022-01-08 MED ORDER — GABAPENTIN 300 MG PO CAPS
300.0000 mg | ORAL_CAPSULE | Freq: Once | ORAL | Status: AC
Start: 2022-01-08 — End: 2022-01-08
  Administered 2022-01-08: 300 mg via ORAL
  Filled 2022-01-08: qty 1

## 2022-01-08 NOTE — ED Triage Notes (Signed)
Pt with pain to left rib, stabbing pain for past couple of weeks, pt just seen by  Dr. Court Joy today and to get a MRI of chest pending insurance approval.  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

## 2022-01-08 NOTE — ED Notes (Signed)
Pt unable to void at this time. 

## 2022-01-08 NOTE — Progress Notes (Unsigned)
     CC: Follow-up (Chest/rib soreness recently had surgery to remove tumor, mesh was put in its place trouble sleeping hurts to breathe.)    HPI:Ms.Breanna Malone is a 41 y.o. adult who presents for evaluation of chest/rib soreness. For the details of today's visit, please refer to the assessment and plan.   Past Medical History:  Diagnosis Date   Abnormal Pap smear    Arthritis    Asthma    Asthma    Bipolar 1 disorder ([place])    Bone spur    rib   Bronchitis    BV (bacterial vaginosis) 10/08/2019   8/19 rx flagyl   Carpal tunnel syndrome    Cervical spine pain    Chest pain    Diabetes mellitus without complication (HCC)    Elevated cholesterol    High cholesterol    Hyperlipidemia    Hyperlipoproteinemia    Hypertension    Post traumatic stress disorder    Right ovarian cyst 10/31/2012   Complex right ovarian cyst seen 8/18 in ER need f/u US   Tendonitis    Vaginal Pap smear, abnormal      Physical Exam: Vitals:   01/08/22 1458  BP: (!) 150/89  Pulse: 74  SpO2: 95%  Weight: 239 lb 1.9 oz (108.5 kg)  Height: '5\' 4"'$  (1.626 m)   General: Well developed , obese Cardiovascular: Normal rate, regular rhythm.  No murmurs, rubs, or gallops Pulmonary : Effort normal, breath sounds normal. No noticeable pain with deep breathing and equal expansion Skin: large scar over left rib cage, no erythema , mild TTP Psychiatric/Behavioral:  happy mood, normal behavior    Assessment & Plan:   Enchondroma of bone Patient presents for the third time this month with increased pain at surgical site/9th left rib. She has been reschedule after missing appointments with CT Surgeon. Pain is keeping her awake and she feel like sight is growing. She is out of all medications because a mix up with her drug coverage at pharmacy. She is going to follow up on this today.  Will send for imaging to further evaluate. Discussed importance of keeping appointment with CT Surgeon.  - MR CHEST W WO  CONTRAST - Continue Gabapentin for pain    Hypertension Patient BP is 150/99. She has not picked up blood pressure medications because a mixup at pharmacy with coverage. Asked patient to go to pharmacy and discuss what she needs to provide.    Lorene Dy, MD

## 2022-01-08 NOTE — Discharge Instructions (Signed)
Take your gabapentin as directed.  You will be contacted if your urine culture is positive and you may start the antibiotic prescription at that time.  If your culture is negative you may discard the prescription antibiotic.  Please follow-up with your primary care provider for recheck, return to the emergency department for any new or worsening symptoms.

## 2022-01-08 NOTE — ED Notes (Signed)
Patient transported to CT 

## 2022-01-08 NOTE — Patient Instructions (Signed)
Thank you for trusting me with your care. To recap, today we discussed the following:   1. Enchondroma of bone - MR CHEST W WO CONTRAST   Reminder: Check with insurance company/pharmacy and pick up medications.

## 2022-01-08 NOTE — ED Notes (Signed)
Patient verbalizes understanding of discharge instructions. Opportunity for questioning and answers were provided. Armband removed by staff, pt discharged from ED. Ambulated out to lobby  

## 2022-01-10 ENCOUNTER — Telehealth: Payer: Self-pay | Admitting: Family Medicine

## 2022-01-10 ENCOUNTER — Telehealth: Payer: Self-pay | Admitting: *Deleted

## 2022-01-10 ENCOUNTER — Ambulatory Visit: Payer: Medicaid Other | Admitting: Family Medicine

## 2022-01-10 NOTE — Assessment & Plan Note (Addendum)
Patient presents for the third time this month with increased pain at surgical site/9th left rib. She has been reschedule after missing appointments with CT Surgeon. Pain is keeping her awake and she feel like sight is growing. She is out of all medications because a mix up with her drug coverage at pharmacy. She is going to follow up on this today.  Will send for imaging to further evaluate. Discussed importance of keeping appointment with CT Surgeon.  - MR CHEST W WO CONTRAST - Continue Gabapentin for pain

## 2022-01-10 NOTE — Telephone Encounter (Signed)
FL2 forms dropped off by [place]     Noted/copied/sleeved

## 2022-01-10 NOTE — Assessment & Plan Note (Signed)
Patient BP is 150/99. She has not picked up blood pressure medications because a mixup at pharmacy with coverage. Asked patient to go to pharmacy and discuss what she needs to provide.

## 2022-01-10 NOTE — Telephone Encounter (Signed)
Contacted to schedule PREP class assessment visit. Scheduled for 01/18/23 @ 1130 at the [facility].

## 2022-01-11 LAB — URINE CULTURE: Culture: 60000 — AB

## 2022-01-12 ENCOUNTER — Telehealth (HOSPITAL_BASED_OUTPATIENT_CLINIC_OR_DEPARTMENT_OTHER): Payer: Self-pay | Admitting: *Deleted

## 2022-01-12 NOTE — Telephone Encounter (Signed)
Post ED Visit - Positive Culture Follow-up  Culture report reviewed by antimicrobial stewardship pharmacist: [place] Team '[]'$  Elenor Quinones, Pharm.D. '[]'$  Heide Guile, Pharm.D., BCPS AQ-ID '[]'$  Parks Neptune, Pharm.D., BCPS '[]'$  Alycia Rossetti, Pharm.D., BCPS '[]'$  [place], [place].D., BCPS, AAHIVP '[]'$  Legrand Como, Pharm.D., BCPS, AAHIVP '[]'$  Salome Arnt, PharmD, BCPS '[]'$  Johnnette Gourd, PharmD, BCPS '[]'$  Hughes Better, PharmD, BCPS '[]'$  Leeroy Cha, PharmD '[]'$  Laqueta Linden, PharmD, BCPS '[]'$  Albertina Parr, PharmD  [place] Team '[]'$  Leodis Sias, PharmD '[]'$  Lindell Spar, PharmD '[]'$  Royetta Asal, PharmD '[]'$  Graylin Shiver, Rph '[]'$  Rema Fendt) Glennon Mac, PharmD '[]'$  Arlyn Dunning, PharmD '[]'$  Netta Cedars, PharmD '[]'$  Dia Sitter, PharmD '[]'$  [name], PharmD '[]'$  Gretta Arab, PharmD '[]'$  Theodis Shove, PharmD '[]'$  Peggyann Juba, PharmD '[]'$  Reuel Boom, PharmD   Positive urine culture Treated with Sulfamethoxazole-Trimethoprim, organism sensitive to the same and no further patient follow-up is required at this time.  CA, PharmD  Harlon Flor Talley 01/12/2022, 9:32 AM

## 2022-01-13 NOTE — ED Provider Notes (Signed)
[facility] EMERGENCY DEPARTMENT Provider Note   CSN: [id number] Arrival date & time: 01/08/22  1528     History  Chief Complaint  Patient presents with   Post-op Problem    Breanna Malone is a 41 y.o. adult.  HPI     Breanna Malone is a 41 y.o. adult who underwent left rib resection 02/2021 who presents to the Emergency Department complaining of sharp stabbing pain to her left chest wall that radiates to her left abdomen.  She felt that she was healing well from her prior surgery.  Her current symptoms began a few weeks ago.  She was seen by her PCP earlier and outpatient MRI ordered.  She comes to ER due to persistent pain.  She denies fever, vomiting, dysuria, shortness of breath.     Home Medications Prior to Admission medications   Medication Sig Start Date End Date Taking? Authorizing Provider  gabapentin (NEURONTIN) 300 MG capsule Take 1 capsule (300 mg total) by mouth 3 (three) times daily. 01/08/22  Yes Lerone Onder, PA-C  sulfamethoxazole-trimethoprim (BACTRIM DS) 800-160 MG tablet Take 1 tablet by mouth 2 (two) times daily for 7 days. 01/08/22 01/15/22 Yes Malanie Koloski, PA-C  buPROPion (WELLBUTRIN XL) 150 MG 24 hr tablet Take 1 tablet (150 mg total) by mouth daily. Patient not taking: Reported on 01/08/2022 12/27/21   Lyndal Pulley, MD  ibuprofen (ADVIL) 200 MG tablet Take 400 mg by mouth every 6 (six) hours as needed for moderate pain or headache. Patient not taking: Reported on 12/27/2021    [provider]  metFORMIN (GLUCOPHAGE) 500 MG tablet Take 500 mg by mouth 2 (two) times daily with a meal. Patient not taking: Reported on 12/27/2021    [provider]  olmesartan (BENICAR) 20 MG tablet Take 1 tablet (20 mg total) by mouth daily. Patient not taking: Reported on 12/27/2021 12/15/21   Lyndal Pulley, MD  pravastatin (PRAVACHOL) 40 MG tablet Take 40 mg by mouth 3 (three) times a week. Patient not taking: Reported on 12/27/2021    [provider]  PROAIR HFA 108 [phone] Base) MCG/ACT inhaler Inhale 1-2 puffs into the lungs every 6 (six) hours as needed for wheezing. Patient not taking: Reported on 01/08/2022 12/27/21   Lyndal Pulley, MD  traMADol (ULTRAM) 50 MG tablet Take 1 tablet (50 mg total) by mouth every 6 (six) hours as needed. Patient not taking: Reported on 12/27/2021 09/26/21   Hayden Rasmussen, MD      Allergies    Darvocet [propoxyphene n-acetaminophen], Hydrocodone-acetaminophen, Lactose intolerance (gi), Other, Penicillins, Latex, and Percocet [oxycodone-acetaminophen]    Review of Systems   Review of Systems  Constitutional:  Negative for appetite change, chills and fever.  Cardiovascular:  Positive for chest pain. Negative for palpitations.  Gastrointestinal:  Positive for abdominal pain. Negative for constipation, diarrhea and vomiting.  Genitourinary:  Negative for difficulty urinating.  Skin:  Negative for rash.  Neurological:  Negative for dizziness, numbness and headaches.    Physical Exam Updated Vital Signs BP (!) 142/100   Pulse 76   Temp 99.2 F (37.3 C) (Oral)   Resp 16   LMP 12/25/2021   SpO2 98%  Physical Exam Vitals and nursing note reviewed.  Constitutional:      General: She is not in acute distress.    Appearance: Normal appearance. She is not toxic-appearing.  Cardiovascular:     Rate and Rhythm: Normal rate and regular rhythm.  Pulmonary:  Effort: Pulmonary effort is normal. No respiratory distress.     Breath sounds: No wheezing or rales.     Comments: Ttp of the lateral left chest wall.  No bony deformities, no soft tissue crepitus.  No rash Chest:     Chest wall: Tenderness present.  Abdominal:     General: There is no distension.     Palpations: Abdomen is soft. There is no mass.     Tenderness: There is abdominal tenderness. There is no right CVA tenderness, left CVA tenderness or guarding.  Musculoskeletal:        General: Normal range of motion.     Cervical  back: Normal range of motion.  Skin:    General: Skin is warm.     Capillary Refill: Capillary refill takes less than 2 seconds.     Findings: No rash.  Neurological:     General: No focal deficit present.     Mental Status: She is alert.     Sensory: No sensory deficit.     Motor: No weakness.     ED Results / Procedures / Treatments   Labs (all labs ordered are listed, but only abnormal results are displayed) Labs Reviewed  URINE CULTURE - Abnormal; Notable for the following components:      Result Value   Culture 60,000 COLONIES/mL PROTEUS MIRABILIS (*)    Organism ID, Bacteria PROTEUS MIRABILIS (*)    All other components within normal limits  CBC WITH DIFFERENTIAL/PLATELET - Abnormal; Notable for the following components:   Lymphs Abs 4.7 (*)    All other components within normal limits  URINALYSIS, ROUTINE W REFLEX MICROSCOPIC - Abnormal; Notable for the following components:   Hgb urine dipstick SMALL (*)    Protein, ur 30 (*)    Leukocytes,Ua SMALL (*)    Bacteria, UA MANY (*)    All other components within normal limits  COMPREHENSIVE METABOLIC PANEL  LIPASE, BLOOD    EKG None  Radiology CT CHEST ABDOMEN PELVIS W CONTRAST  Result Date: 01/08/2022 CLINICAL DATA:  Left chest wall pain. Prior tumor resection of the ninth rib. EXAM: CT CHEST, ABDOMEN, AND PELVIS WITH CONTRAST TECHNIQUE: Multidetector CT imaging of the chest, abdomen and pelvis was performed following the standard protocol during bolus administration of intravenous contrast. RADIATION DOSE REDUCTION: This exam was performed according to the departmental dose-optimization program which includes automated exposure control, adjustment of the mA and/or kV according to patient size and/or use of iterative reconstruction technique. CONTRAST:  156m OMNIPAQUE IOHEXOL 300 MG/ML  SOLN COMPARISON:  MRI of the chest 01/13/2021. CT renal stone 03/24/2020. FINDINGS: CT CHEST FINDINGS Cardiovascular: No significant  vascular findings. Normal heart size. No pericardial effusion. Mediastinum/Nodes: There are bilateral thyroid nodules. The largest nodules in the left thyroid measuring 16 mm. There are no enlarged mediastinal, hilar, or axillary lymph nodes. The esophagus is within normal limits. Lungs/Pleura: Bands of opacity in the left lower lobe likely represent atelectasis or scarring. There is minimal smooth left pleural thickening inferiorly in the region of rib resection. No layering pleural effusion or pneumothorax. Trachea and central airways are within normal limits. Musculoskeletal: There has been interval resection of the left lateral and anterior ninth rib. There is a healing posterolateral left tenth rib fracture. No new focal osseous lesion. No acute fractures are seen. There is some mild scarring in the lateral left chest wall in the region of prior surgery. There is no fluid collection or hematoma identified. CT ABDOMEN PELVIS FINDINGS  Hepatobiliary: No focal liver abnormality is seen. No gallstones, gallbladder wall thickening, or biliary dilatation. Pancreas: Unremarkable. No pancreatic ductal dilatation or surrounding inflammatory changes. Spleen: Normal in size without focal abnormality. Adrenals/Urinary Tract: Adrenal glands are unremarkable. Kidneys are normal, without renal calculi, focal lesion, or hydronephrosis. Bladder is unremarkable. Stomach/Bowel: Stomach is within normal limits. Appendix appears normal. No evidence of bowel wall thickening, distention, or inflammatory changes. Vascular/Lymphatic: No significant vascular findings are present. No enlarged abdominal or pelvic lymph nodes. Reproductive: Uterus and bilateral adnexa are unremarkable. Other: No abdominal wall hernia or abnormality. No abdominopelvic ascites. Musculoskeletal: No acute or significant osseous findings. IMPRESSION: 1. Interval resection of left ninth rib. There is mild scarring in this region without fluid collection or  hematoma. 2. Healing left tenth rib fracture. 3. No acute localizing process in the chest, abdomen or pelvis. 4. 1.6 cm incidental thyroid nodule. Recommend non-emergent thyroid ultrasound. Reference: J Am Coll Radiol. 2015 Feb;12(2): 143-50 Electronically Signed   By: Ronney Asters M.D.   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  She reports that her pain has abruptly worsened, and now seems to extend into her left lower back, as well as radiating around into the left abdomen.  She says the pain is making her nauseated.  Her doctor has ordered a repeat MRI scan but this is pending due to insurance issues.  She has not been able to make an appointment the PCP.  On exam the patient appears extremely comfortable.  She does have diffuse tenderness along the left chest wall, left CVA tenderness, left upper quadrant abdominal tenderness.  Labs do not show any acute emergencies.  It is most likely this pain is coming from the underlying mass, which may be impinging upon a nerve, or irritating the diaphragm.  However is difficult to exclude a secondary cause of her pain, such as an intra-abdominal process, pyelonephritis, or pneumonia.  We discussed CT imaging here in the ED which she is agreeable to.  We will give her Toradol and gabapentin for pain.  I have a low suspicion for PE at this time. [MT]    Clinical Course User Index [MT]  Wyvonnia Dusky, MD                           Medical Decision Making Pt here for persistent pain of left chest wall and left abdomen.  Had rib resection in January due to large tumor.    On exam. Localized tenderness left chest wall and left abdomen.  No rash, erythema or edema  Possible recurrent tumor vs MSK, PE, infection.  PE felt less likely, pt denies any SOB and her vitals are reassuring    Amount and/or Complexity of Data Reviewed Labs: ordered.    Details: Labs unremarkable except urine small leukocytes, many bacteria Urine culture pending Radiology: ordered.  Details: CT chest abd/pevis w/o acute finding Discussion of management or test interpretation with external provider(s): Reassuring work up.  Doubt emergent process.  Pt denies dysuria, but U/A shows developing infection.  Will start abx and culture is pending.  Pt appears approp for d/c home and will f/u closely with PCP.  Also informed pt of thyroid findings and will f/u out pt.   Risk Prescription drug management.           Final Clinical Impression(s) / ED Diagnoses Final diagnoses:  Post-op pain  Medication refill    Rx / DC Orders ED Discharge Orders          Ordered    gabapentin (NEURONTIN) 300 MG capsule  3 times daily        01/08/22 1956    sulfamethoxazole-trimethoprim (BACTRIM DS) 800-160 MG tablet  2 times daily        01/08/22 1956              Kem Parkinson, PA-C 01/13/22 1041    Wyvonnia Dusky, MD 01/15/22 1357

## 2022-01-15 DIAGNOSIS — M1990 Unspecified osteoarthritis, unspecified site: Secondary | ICD-10-CM | POA: Diagnosis not present

## 2022-01-15 DIAGNOSIS — Z885 Allergy status to narcotic agent status: Secondary | ICD-10-CM | POA: Diagnosis not present

## 2022-01-15 DIAGNOSIS — Z91040 Latex allergy status: Secondary | ICD-10-CM | POA: Diagnosis not present

## 2022-01-15 DIAGNOSIS — J45909 Unspecified asthma, uncomplicated: Secondary | ICD-10-CM | POA: Diagnosis not present

## 2022-01-15 DIAGNOSIS — I10 Essential (primary) hypertension: Secondary | ICD-10-CM | POA: Diagnosis not present

## 2022-01-15 DIAGNOSIS — Z833 Family history of diabetes mellitus: Secondary | ICD-10-CM | POA: Diagnosis not present

## 2022-01-15 DIAGNOSIS — Z6841 Body Mass Index (BMI) 40.0 and over, adult: Secondary | ICD-10-CM | POA: Diagnosis not present

## 2022-01-15 DIAGNOSIS — Z823 Family history of stroke: Secondary | ICD-10-CM | POA: Diagnosis not present

## 2022-01-15 DIAGNOSIS — M792 Neuralgia and neuritis, unspecified: Secondary | ICD-10-CM | POA: Diagnosis not present

## 2022-01-15 DIAGNOSIS — F324 Major depressive disorder, single episode, in partial remission: Secondary | ICD-10-CM | POA: Diagnosis not present

## 2022-01-15 DIAGNOSIS — Z8249 Family history of ischemic heart disease and other diseases of the circulatory system: Secondary | ICD-10-CM | POA: Diagnosis not present

## 2022-01-17 ENCOUNTER — Telehealth: Payer: Self-pay | Admitting: *Deleted

## 2022-01-17 NOTE — Telephone Encounter (Signed)
Patient contacted me to cancel her PREP Class assessment visit today due to "elevated blood pressure.for 3 days." We have rescheduled for 01/18/2022 at 1520.

## 2022-01-18 ENCOUNTER — Ambulatory Visit: Payer: 59 | Admitting: Family Medicine

## 2022-01-18 ENCOUNTER — Encounter: Payer: Self-pay | Admitting: Family Medicine

## 2022-01-18 VITALS — BP 138/88 | HR 86 | Ht 64.0 in | Wt 237.1 lb

## 2022-01-18 DIAGNOSIS — E7849 Other hyperlipidemia: Secondary | ICD-10-CM

## 2022-01-18 DIAGNOSIS — J4520 Mild intermittent asthma, uncomplicated: Secondary | ICD-10-CM

## 2022-01-18 DIAGNOSIS — F339 Major depressive disorder, recurrent, unspecified: Secondary | ICD-10-CM

## 2022-01-18 DIAGNOSIS — D169 Benign neoplasm of bone and articular cartilage, unspecified: Secondary | ICD-10-CM

## 2022-01-18 MED ORDER — PRAVASTATIN SODIUM 40 MG PO TABS: 40.0000 mg | ORAL_TABLET | ORAL | 1 refills | Status: DC

## 2022-01-18 MED ORDER — BUPROPION HCL ER (XL) 150 MG PO TB24: 150.0000 mg | ORAL_TABLET | Freq: Every day | ORAL | 1 refills | Status: DC

## 2022-01-18 MED ORDER — TRAMADOL HCL 50 MG PO TABS: 50.0000 mg | ORAL_TABLET | Freq: Three times a day (TID) | ORAL | 0 refills | Status: AC | PRN

## 2022-01-18 MED ORDER — PROAIR HFA 108 (90 BASE) MCG/ACT IN AERS: 1.0000 | INHALATION_SPRAY | Freq: Four times a day (QID) | RESPIRATORY_TRACT | 1 refills | Status: DC | PRN

## 2022-01-18 NOTE — Patient Instructions (Signed)
I appreciate the opportunity to provide care to you today!    Follow up:  3 months  Labs: next visit  Please pick up your medication at the pharmacy and take as prescribed  Please follow-up with Dr.Hendrickson on 05/24/2021 at 11:30 AM   Please continue to a heart-healthy diet and increase your physical activities. Try to exercise for 76mns at least three times a week.      It was a pleasure to see you and I look forward to continuing to work together on your health and well-being. Please do not hesitate to call the office if you need care or have questions about your care.   Have a wonderful day and week. With Gratitude, GAlvira MondayMSN, FNP-BC

## 2022-01-18 NOTE — Assessment & Plan Note (Signed)
Mass removed by CT surgeon by Dr.Hendrickson on 03/16/2021 and pathology were benign She is following up with the surgeon on 01/23/2022 at 11:30 AM We will provide a short course of narcotics for pain control

## 2022-01-18 NOTE — Progress Notes (Signed)
Established Patient Office Visit  Subjective:  Patient ID: Breanna Malone, adult    DOB: 12/26/1980  Age: 41 y.o. MRN: [id number]  CC:  Chief Complaint  Patient presents with   Follow-up    Pt reports hurting a lot today, pt reports pain is in her low back. Needs refills today.     HPI Breanna Malone is a 41 y.o. adult with past medical history of asthma, hypertension, bipolar 1 disorder, and carpal tunnel syndrome of the right wrist presents for f/u of  chronic medical conditions.  Enchondroma of bone: She complains of postop pain at the sites of her surgery in her lower back .she rates pain a 10 out of 10.  She described pain as sharp and achy, noting pain to be constant.  She complains of tenderness along the left chest wall, and left lower quadrant.  She is following up with the cardiothoracic surgery on 01/23/2022 at 11:30 AM.   Past Medical History:  Diagnosis Date   Abnormal Pap smear    Arthritis    Asthma    Asthma    Bipolar 1 disorder ([place])    Bone spur    rib   Bronchitis    BV (bacterial vaginosis) 10/08/2019   8/19 rx flagyl   Carpal tunnel syndrome    Cervical spine pain    Chest pain    Diabetes mellitus without complication (HCC)    Elevated cholesterol    High cholesterol    Hyperlipidemia    Hyperlipoproteinemia    Hypertension    Post traumatic stress disorder    Right ovarian cyst 10/31/2012   Complex right ovarian cyst seen 8/18 in ER need f/u US   Tendonitis    Vaginal Pap smear, abnormal     Past Surgical History:  Procedure Laterality Date   CARPAL TUNNEL RELEASE Right 06/28/2016   Procedure: RIGHT CARPAL TUNNEL RELEASE;  Surgeon: Carole Civil, MD;  Location: [facility];  Service: Orthopedics;  Laterality: Right;   HERNIA REPAIR     umbilical   RIB RESECTION Left 03/16/2021   Procedure: RESECTION OF TUMOR LEFT NINTH RIB;  Surgeon: Melrose Nakayama, MD;  Location: [facility] OR;  Service: Thoracic;  Laterality: Left;    Family History   Problem Relation Age of Onset   Diabetes Mother    Hypertension Mother    Asthma Father    Bronchitis Daughter    Asthma Daughter    Asthma Daughter    Bronchitis Daughter    Asthma Son    Bronchitis Son    Asthma Son    Stroke Paternal Grandmother    Heart attack Paternal Grandmother    Cancer Paternal Grandmother    Cancer Cousin    Asthma Other    Bronchitis Other     Social History   Socioeconomic History   Marital status: Widowed    Spouse name: Not on file   Number of children: Not on file   Years of education: Not on file   Highest education level: Not on file  Occupational History   Not on file  Tobacco Use   Smoking status: Never   Smokeless tobacco: Never  Vaping Use   Vaping Use: Never used  Substance and Sexual Activity   Alcohol use: No   Drug use: No   Sexual activity: Yes    Birth control/protection: Condom  Other Topics Concern   Not on file  Social History Narrative   Not on file  Social Determinants of Health   Financial Resource Strain: Low Risk  (07/27/2020)   Overall Financial Resource Strain (CARDIA)    Difficulty of Paying Living Expenses: Not hard at all  Food Insecurity: No Food Insecurity (07/27/2020)   Hunger Vital Sign    Worried About Running Out of Food in the Last Year: Never true    Ran Out of Food in the Last Year: Never true  Transportation Needs: Unmet Transportation Needs (07/27/2020)   PRAPARE - Transportation    Lack of Transportation (Medical): No    Lack of Transportation (Non-Medical): Yes  Physical Activity: Insufficiently Active (07/27/2020)   Exercise Vital Sign    Days of Exercise per Week: 2 days    Minutes of Exercise per Session: 30 min  Stress: No Stress Concern Present (07/27/2020)   [place]    Feeling of Stress : Not at all  Social Connections: Moderately Isolated (07/27/2020)   Social Connection and Isolation Panel [NHANES]    Frequency of  Communication with Friends and Family: Three times a week    Frequency of Social Gatherings with Friends and Family: Three times a week    Attends Religious Services: 1 to 4 times per year    Active Member of Clubs or Organizations: No    Attends Archivist Meetings: Never    Marital Status: Widowed  Intimate Partner Violence: Not At Risk (07/27/2020)   Humiliation, Afraid, Rape, and Kick questionnaire    Fear of Current or Ex-Partner: No    Emotionally Abused: No    Physically Abused: No    Sexually Abused: No    Outpatient Medications Prior to Visit  Medication Sig Dispense Refill   gabapentin (NEURONTIN) 300 MG capsule Take 1 capsule (300 mg total) by mouth 3 (three) times daily. 30 capsule 0   ibuprofen (ADVIL) 200 MG tablet Take 400 mg by mouth every 6 (six) hours as needed for moderate pain or headache.     olmesartan (BENICAR) 20 MG tablet Take 1 tablet (20 mg total) by mouth daily. (Patient not taking: Reported on 01/18/2022) 30 tablet 1   buPROPion (WELLBUTRIN XL) 150 MG 24 hr tablet Take 1 tablet (150 mg total) by mouth daily. (Patient not taking: Reported on 01/08/2022) 30 tablet 1   metFORMIN (GLUCOPHAGE) 500 MG tablet Take 500 mg by mouth 2 (two) times daily with a meal. (Patient not taking: Reported on 12/27/2021)     pravastatin (PRAVACHOL) 40 MG tablet Take 40 mg by mouth 3 (three) times a week. (Patient not taking: Reported on 12/27/2021)     PROAIR HFA 108 (90 Base) MCG/ACT inhaler Inhale 1-2 puffs into the lungs every 6 (six) hours as needed for wheezing. (Patient not taking: Reported on 01/08/2022) 18 g 1   traMADol (ULTRAM) 50 MG tablet Take 1 tablet (50 mg total) by mouth every 6 (six) hours as needed. (Patient not taking: Reported on 12/27/2021) 15 tablet 0   prenatal vitamin w/FE, FA (NATACHEW) chewable tablet 1 tablet      No facility-administered medications prior to visit.    Allergies  Allergen Reactions   Darvocet [Propoxyphene N-Acetaminophen] Itching    Hydrocodone-Acetaminophen Itching   Lactose Intolerance (Gi) Other (See Comments)    Causes stomach cramping.   Other Hives    Patient is allergic to corndogs.   Penicillins Hives and Itching    Has patient had a PCN reaction causing immediate rash, facial/tongue/throat swelling, SOB or lightheadedness with  hypotension:Yes Has patient had a PCN reaction causing severe rash involving mucus membranes or skin necrosis:No Has patient had a PCN reaction that required hospitalization:No Has patient had a PCN reaction occurring within the last 10 years:No If all of the above answers are "NO", then may proceed with Cephalosporin use.    Latex Itching and Rash   Percocet [Oxycodone-Acetaminophen] Itching and Rash    ROS Review of Systems  Constitutional:  Negative for fatigue and fever.  Respiratory:  Negative for chest tightness and shortness of breath.   Cardiovascular:  Negative for chest pain and palpitations.  Musculoskeletal:  Positive for arthralgias and back pain.      Objective:    Physical Exam HENT:     Head: Normocephalic.  Cardiovascular:     Rate and Rhythm: Normal rate and regular rhythm.     Pulses: Normal pulses.     Heart sounds: Normal heart sounds.  Pulmonary:     Effort: Pulmonary effort is normal.     Breath sounds: Normal breath sounds.  Abdominal:     Tenderness: There is left CVA tenderness.  Musculoskeletal:     Cervical back: No rigidity.     Lumbar back: Tenderness present.  Neurological:     Mental Status: She is alert.     BP 138/88 (BP Location: Left Arm)   Pulse 86   Ht _0  (1.626 m)   Wt 237 lb 1.9 oz (107.6 kg)   LMP 12/25/2021   SpO2 95%   BMI 40.70 kg/m  Wt Readings from Last 3 Encounters:  01/18/22 237 lb 1.9 oz (107.6 kg)  01/08/22 239 lb 1.9 oz (108.5 kg)  12/27/21 238 lb (108 kg)    Lab Results  Component Value Date   TSH 0.669 07/27/2020   Lab Results  Component Value Date   WBC 9.8 01/08/2022   HGB 12.3  01/08/2022   HCT 37.8 01/08/2022   MCV 95.0 01/08/2022   PLT 342 01/08/2022   Lab Results  Component Value Date   NA 137 01/08/2022   K 3.8 01/08/2022   CO2 25 01/08/2022   GLUCOSE 97 01/08/2022   BUN 8 01/08/2022   CREATININE 0.71 01/08/2022   BILITOT 0.4 01/08/2022   ALKPHOS 65 01/08/2022   AST 35 01/08/2022   ALT 43 01/08/2022   PROT 7.5 01/08/2022   ALBUMIN 4.1 01/08/2022   CALCIUM 9.0 01/08/2022   ANIONGAP 6 01/08/2022   EGFR 77 12/13/2021   Lab Results  Component Value Date   CHOL 209 (H) 12/13/2021   Lab Results  Component Value Date   HDL 39 (L) 12/13/2021   Lab Results  Component Value Date   LDLCALC 142 (H) 12/13/2021   Lab Results  Component Value Date   TRIG 152 (H) 12/13/2021   Lab Results  Component Value Date   CHOLHDL 5.4 (H) 12/13/2021   Lab Results  Component Value Date   HGBA1C 6.3 (H) 12/13/2021      Assessment & Plan:  Enchondroma of bone Assessment & Plan: Mass removed by CT surgeon by Dr.Hendrickson on 03/16/2021 and pathology were benign She is following up with the surgeon on 01/23/2022 at 11:30 AM We will provide a short course of narcotics for pain control  Orders: -     traMADol HCl; Take 1 tablet (50 mg total) by mouth every 8 (eight) hours as needed for up to 5 days.  Dispense: 10 tablet; Refill: 0  Mild intermittent asthma, unspecified whether complicated -  ProAir HFA; Inhale 1-2 puffs into the lungs every 6 (six) hours as needed for wheezing.  Dispense: 18 g; Refill: 1  Episode of recurrent major depressive disorder, unspecified depression episode severity (HCC) -     buPROPion HCl ER (XL); Take 1 tablet (150 mg total) by mouth daily.  Dispense: 30 tablet; Refill: 1  Other hyperlipidemia -     Pravastatin Sodium; Take 1 tablet (40 mg total) by mouth 3 (three) times a week.  Dispense: 30 tablet; Refill: 1    Follow-up: Return in about 3 months (around 04/19/2022).   Alvira Monday, FNP

## 2022-01-19 ENCOUNTER — Encounter: Payer: Self-pay | Admitting: *Deleted

## 2022-01-19 NOTE — Progress Notes (Signed)
[facility] PREP Evaluation  Patient Details  Name: Breanna Malone MRN: [id number] Date of Birth: 06/21/1980 Age: 41 y.o. PCP: Alvira Monday, FNP  Vitals:   01/18/22 1530  BP: (!) 142/94  Pulse: 88  Resp: 20  SpO2: 97%  Weight: 240 lb (108.9 kg)     [facility] Eval - 01/18/22 1530       [facility] "PREP" Location   [facility] "PREP" Location [facility]      Referral    Referring Provider Steen    Reason for referral High Cholesterol;Hypertension;Inactivity;Obesitity/Overweight;Other   Pre-diabetes   Program Start Date 01/23/22      Measurement   Waist Circumference 49.25 inches    Hip Circumference 50.5 inches    Body fat 42.6 percent      Information for Trainer   Goals Weight loss 10-15lbs in 12 weeks,establish an exercise routine, healthier diet, "feel better about myself"    Current Exercise none    Orthopedic Concerns none    Pertinent Medical History recent surgery left side abdomen    Current Barriers none      Mobility and Daily Activities   I find it easy to walk up or down two or more flights of stairs. 4    I have no trouble taking out the trash. 4    I do housework such as vacuuming and dusting on my own without difficulty. 4    I can easily lift a gallon of milk (8lbs). 4    I can easily walk a mile. 3    I have no trouble reaching into high cupboards or reaching down to pick up something from the floor. 4    I do not have trouble doing out-door work such as Armed forces logistics/support/administrative officer, raking leaves, or gardening. 4      Mobility and Daily Activities   I feel younger than my age. 2    I feel independent. 4    I feel energetic. 4    I live an active life.  2    I feel strong. 2    I feel healthy. 2    I feel active as other people my age. 2      How fit and strong are you.   Fit and Strong Total Score 45            Past Medical History:  Diagnosis Date   Abnormal Pap smear    Arthritis    Asthma    Asthma    Bipolar 1 disorder ([place])    Bone spur    rib    Bronchitis    BV (bacterial vaginosis) 10/08/2019   8/19 rx flagyl   Carpal tunnel syndrome    Cervical spine pain    Chest pain    Diabetes mellitus without complication (HCC)    Elevated cholesterol    High cholesterol    Hyperlipidemia    Hyperlipoproteinemia    Hypertension    Post traumatic stress disorder    Right ovarian cyst 10/31/2012   Complex right ovarian cyst seen 8/18 in ER need f/u US   Tendonitis    Vaginal Pap smear, abnormal    Past Surgical History:  Procedure Laterality Date   CARPAL TUNNEL RELEASE Right 06/28/2016   Procedure: RIGHT CARPAL TUNNEL RELEASE;  Surgeon: Carole Civil, MD;  Location: [facility];  Service: Orthopedics;  Laterality: Right;   HERNIA REPAIR     umbilical   RIB RESECTION Left 03/16/2021  Procedure: RESECTION OF TUMOR LEFT NINTH RIB;  Surgeon: Melrose Nakayama, MD;  Location: [facility] OR;  Service: Thoracic;  Laterality: Left;   Social History   Tobacco Use  Smoking Status Never  Smokeless Tobacco Never    [place] 01/19/2022, 8:32 AM  PREP Class to begin 01/23/2022 at the [facility] every Tuesday and Thursday form 1130-1245. Indicates no barriers to participation.

## 2022-01-20 IMAGING — MR MR CHEST MEDIASTINUM WO/W CM
8 series · 47 of 48 positions shown · IV contrast (gadavist)
Comparison: Abdominopelvic CT 03/24/2020 and additional priors
dating back to a chest CT dated 04/23/2009.

CLINICAL DATA: Expansile left 9th rib lesion. Staging possible
enchondroma versus transformation to chondrosarcoma on CT.

EXAM:
MRI CHEST WITHOUT AND WITH CONTRAST
TECHNIQUE: Multiplanar imaging of the chest with attention to the posterior
left chest wall was performed. Study includes post-contrast imaging.
CONTRAST:  10mL GADAVIST GADOBUTROL 1 MMOL/ML IV SOLN

[Series 5: t2_tse_fs_tra_352 · axial · B · 4.0mm · 0.69mm/px · z∈[-80,+85]mm · 6 of 34 slices shown]
[im 1/34]
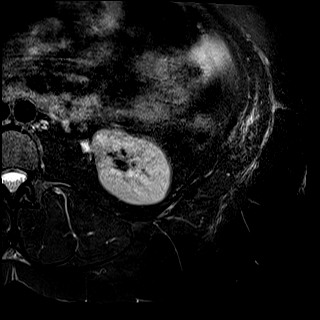
[im 7/34]
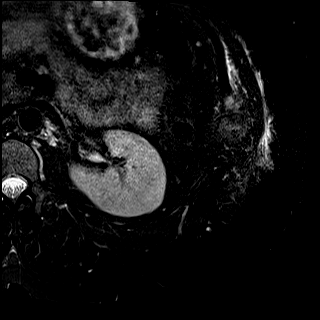
[im 14/34]
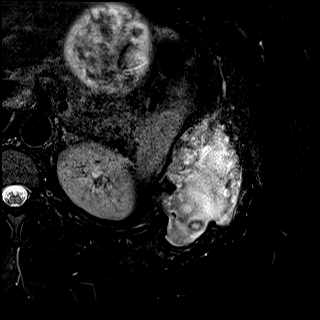
[im 20/34]
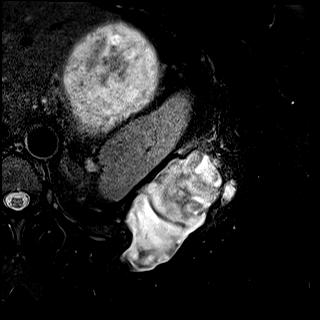
[im 27/34]
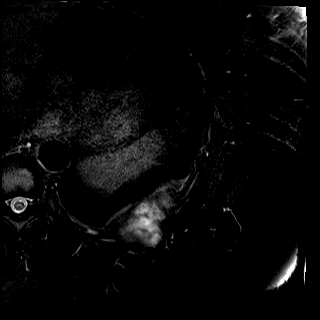
[im 34/34]
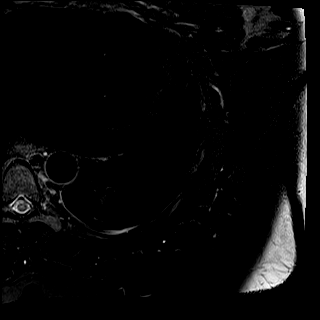

[Series 6: t1_tse_tra_320 · axial · B · 4.0mm · 0.69mm/px · z∈[-80,+85]mm · 6 of 34 slices shown]
[im 1/34]
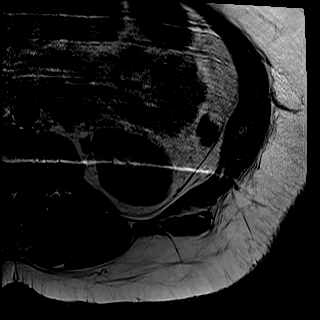
[im 7/34]
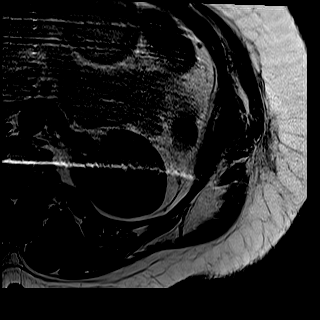
[im 14/34]
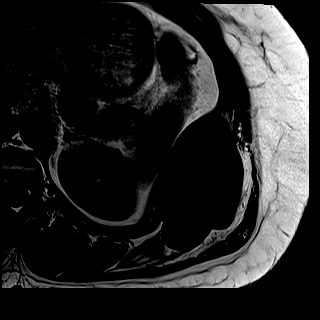
[im 20/34]
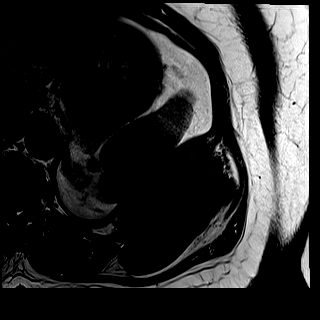
[im 27/34]
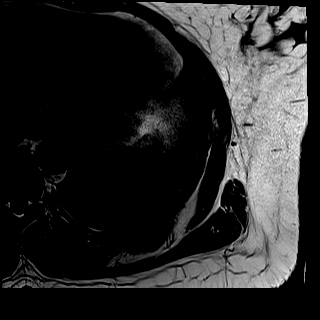
[im 34/34]
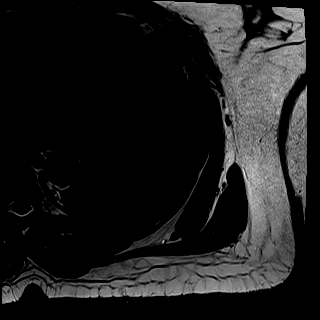

[Series 7: t1_tse_cor_304 · coronal · B · 4.0mm · 0.72mm/px · 6 of 34 slices shown]
[im 1/34]
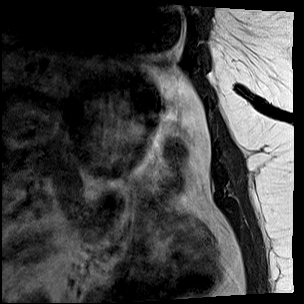
[im 7/34]
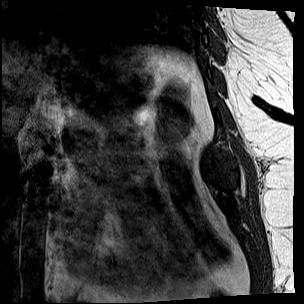
[im 14/34]
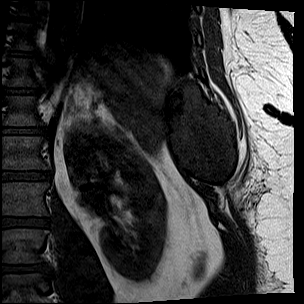
[im 20/34]
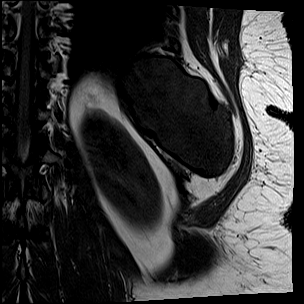
[im 27/34]
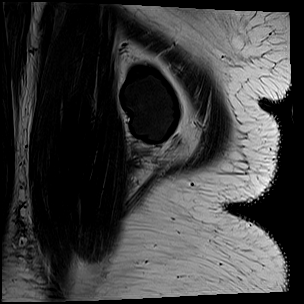
[im 34/34]
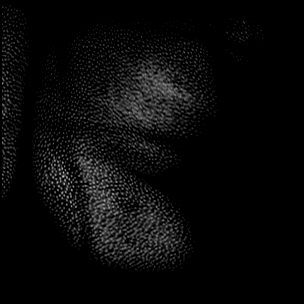

[Series 8: t2_tse_fs_cor_304 · coronal · B · 4.0mm · 0.72mm/px · 6 of 34 slices shown]
[im 1/34]
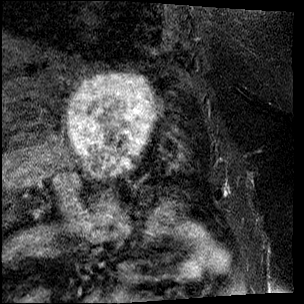
[im 7/34]
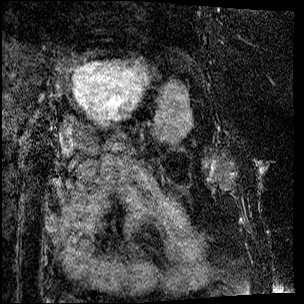
[im 14/34]
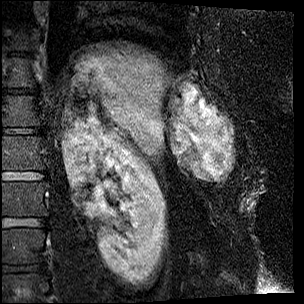
[im 20/34]
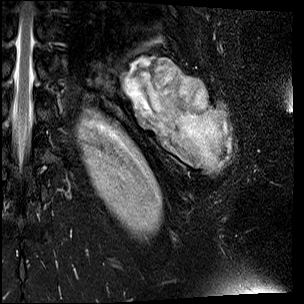
[im 27/34]
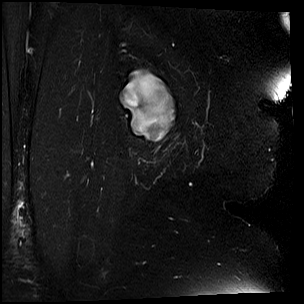
[im 34/34]
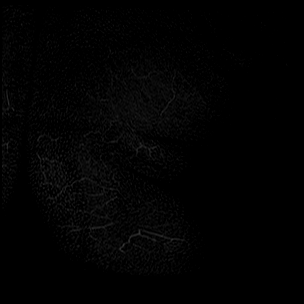

[Series 9: t2_tse_stir_sag_288 · sagittal · B · 4.0mm · 0.76mm/px · 6 of 36 slices shown]
[im 1/36]
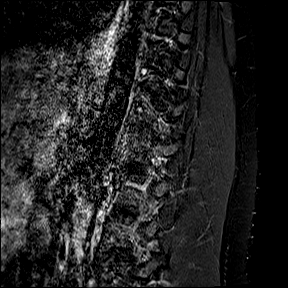
[im 8/36]
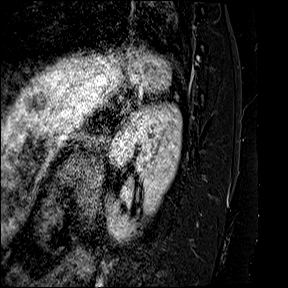
[im 15/36]
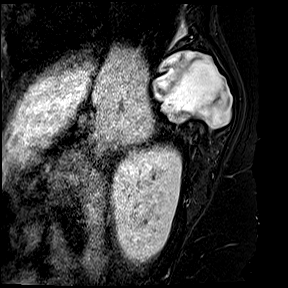
[im 22/36]
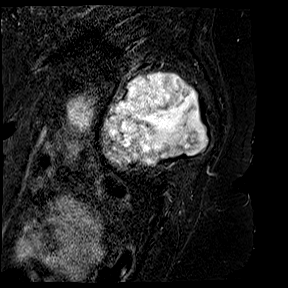
[im 29/36]
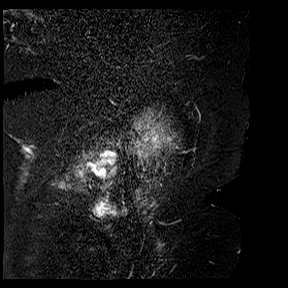
[im 36/36]
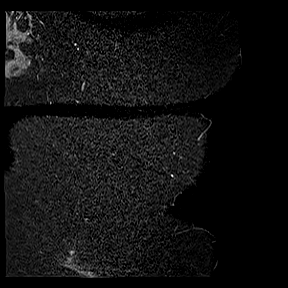

[Series 10: t1_tse_fs_tra_320 · axial · B · 4.0mm · 0.69mm/px · z∈[-80,+85]mm · 6 of 34 slices shown (1 of 2)]
[im 1/34]
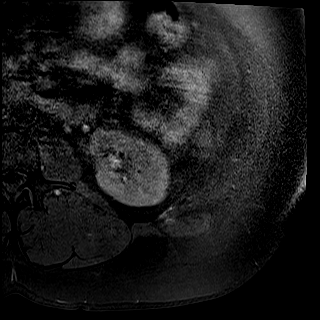
[im 7/34]
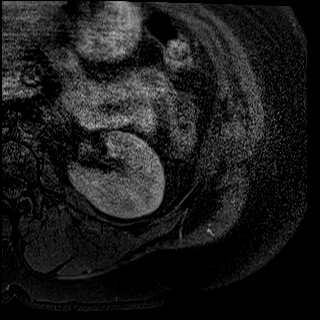
[im 14/34]
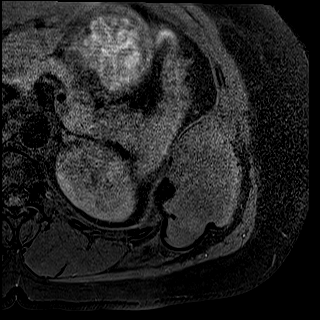
[im 20/34]
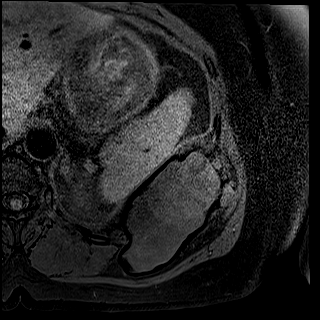
[im 27/34]
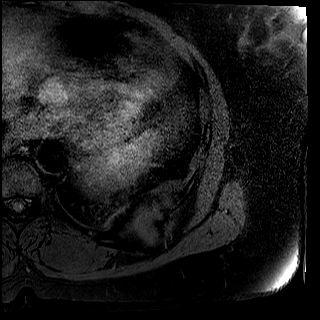
[im 34/34]
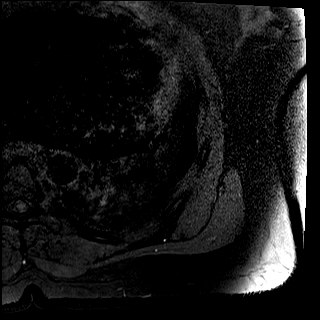

[Series 11: t1_tse_fs_tra_320 · axial · B · 4.0mm · 0.69mm/px · z∈[-80,+85]mm · 6 of 34 slices shown (2 of 2)]
[im 1/34]
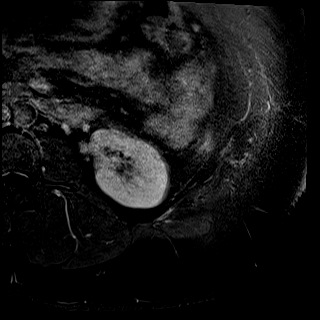
[im 7/34]
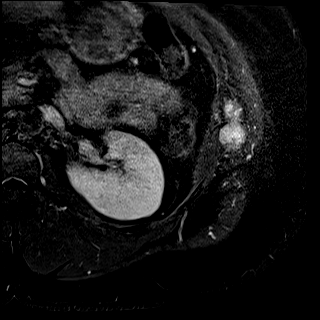
[im 14/34]
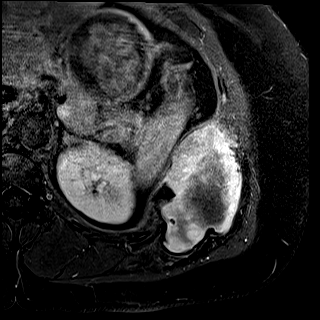
[im 20/34]
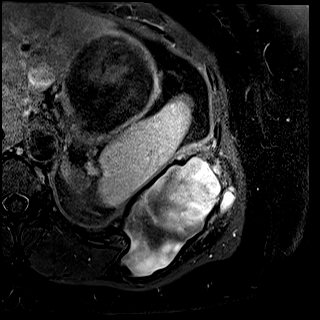
[im 27/34]
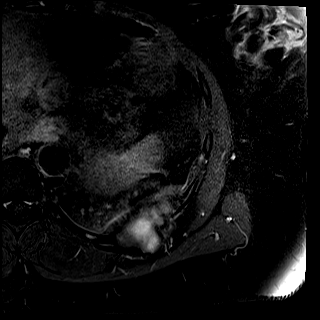
[im 34/34]
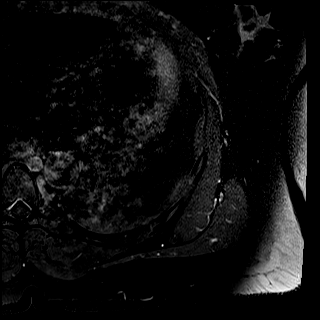

[Series 12: t1_tse_fs_cor_304 · coronal · B · 4.0mm · 0.86mm/px · 5 of 34 slices shown]
[im 1/34]
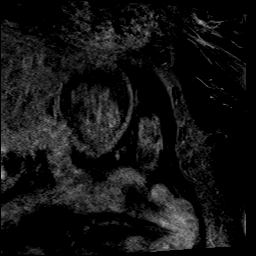
[im 7/34]
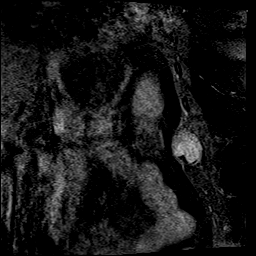
[im 14/34]
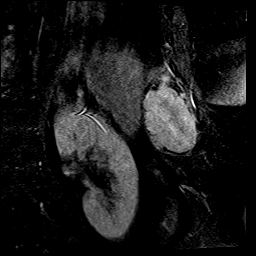
[im 20/34]
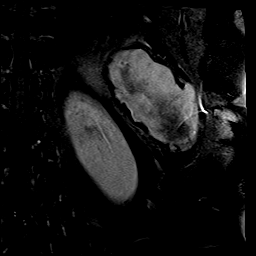
[im 27/34]
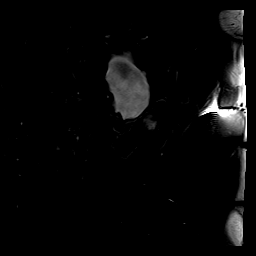

[47 of 48 positions shown; findings below may reference images not displayed]

FINDINGS: Cardiovascular: The visualized heart and vascular structures appear
unremarkable. No pericardial effusion.

Mediastinum/Nodes: No enlarged lymph nodes are seen within the
visualized mediastinum or inferior left hilar regions.The visualized
mediastinum appears normal.

Lungs/Pleura: No left-sided pleural effusion. The visualized left
lung is grossly clear aside from probable mild compressive lower
lobe atelectasis.

Upper abdomen:  The visualized upper abdomen appears unremarkable.

Musculoskeletal/Chest wall: Chronic expansile lesion involving the
left 9th rib is again noted. This has been present on prior studies
dating back to at least 04/23/2009. This lesion is difficult to
accurately measure given its shape, although appears similar in
overall size from the remote priors. Measurements are approximately
11.3 x 4.9 x 4.6 cm. This lesion demonstrates fairly homogeneous T1
signal isointense to muscle, heterogeneous T2 signal and
heterogeneous enhancement following contrast. There is a peripheral
rim of dense sclerotic bone. The sclerotic margins of the lesion
appear thinner along the anterior inferior aspect compared with the
remote CT, and there may be an associated enlarging soft tissue
component which measures approximately 4.3 x 2.3 cm on axial image
[DATE]. This compares with approximately 3.8 x 2.4 cm on the CT from
9 months ago. No other chest wall masses or rib lesions are seen.
There is mild soft tissue edema within the lateral abdominal wall
inferior to this lesion.
IMPRESSION: 1. The large expansile lesion of the left 9th rib does not appear
grossly changed in overall size from remote prior studies, and
remains most consistent with fibrous dysplasia. However, some
erosion of the anterior inferior cortex of this lesion has
developed, and there may be an enlarging soft tissue component
anteriorly, findings which could indicate malignant transformation,
especially if this lesion is now symptomatic. No other aggressive
features are identified. Consider tissue sampling or short-term
follow-up imaging.
2. No other suspicious osseous lesions or associated pleural
effusion.

## 2022-01-22 ENCOUNTER — Ambulatory Visit (HOSPITAL_COMMUNITY): Payer: Medicaid Other

## 2022-01-23 ENCOUNTER — Ambulatory Visit: Payer: Medicaid Other | Admitting: Thoracic Surgery (Cardiothoracic Vascular Surgery)

## 2022-01-31 ENCOUNTER — Encounter: Payer: Self-pay | Admitting: Family Medicine

## 2022-01-31 ENCOUNTER — Encounter: Payer: Self-pay | Admitting: *Deleted

## 2022-01-31 ENCOUNTER — Ambulatory Visit: Payer: Medicaid Other | Admitting: Family Medicine

## 2022-01-31 NOTE — Progress Notes (Signed)
[facility] PREP Weekly Session  Patient Details  Name: Breanna Malone MRN: [id number] Date of Birth: [birth year] Age: 41 y.o. PCP: Alvira Monday, FNP  Vitals:   01/30/22 1300  Weight: 234 lb (106.1 kg)     [facility] Weekly seesion - 01/30/22 1300       [facility] "PREP" Location   [facility] "PREP" Location [facility]      Weekly Session   Topic Discussed Importance of resistance training;Other ways to be active   Reviewed national standards for cardiovascular exercise and resistance training   Minutes exercised this week 15 minutes    Classes attended to date [place], [place] 01/31/2022, 10:40 AM

## 2022-02-06 ENCOUNTER — Ambulatory Visit: Payer: Medicaid Other | Admitting: Family Medicine

## 2022-02-08 ENCOUNTER — Ambulatory Visit (HOSPITAL_COMMUNITY)
Admission: RE | Admit: 2022-02-08 | Discharge: 2022-02-08 | Disposition: A | Discharge status: Home / Self Care | Payer: 59 | Source: Ambulatory Visit | Attending: Internal Medicine | Admitting: Internal Medicine

## 2022-02-08 DIAGNOSIS — D169 Benign neoplasm of bone and articular cartilage, unspecified: Secondary | ICD-10-CM | POA: Insufficient documentation

## 2022-02-08 DIAGNOSIS — R0781 Pleurodynia: Secondary | ICD-10-CM | POA: Diagnosis not present

## 2022-02-08 MED ORDER — GADOBUTROL 1 MMOL/ML IV SOLN
10.0000 mL | Freq: Once | INTRAVENOUS | Status: AC | PRN
  Administered 2022-02-08: 10 mL via INTRAVENOUS

## 2022-02-27 ENCOUNTER — Ambulatory Visit: Payer: 59 | Admitting: Thoracic Surgery (Cardiothoracic Vascular Surgery)

## 2022-03-01 ENCOUNTER — Telehealth: Payer: Self-pay | Admitting: Family Medicine

## 2022-03-01 NOTE — Telephone Encounter (Signed)
Medical source opinion of patients capability to manage benefits   Noted  Copied In brown folder. Pt appt 1/25

## 2022-03-07 ENCOUNTER — Ambulatory Visit: Payer: 59 | Admitting: Family Medicine

## 2022-03-15 ENCOUNTER — Encounter: Payer: Self-pay | Admitting: Family Medicine

## 2022-03-15 ENCOUNTER — Other Ambulatory Visit (HOSPITAL_COMMUNITY): Payer: Self-pay | Admitting: Family Medicine

## 2022-03-15 ENCOUNTER — Ambulatory Visit (INDEPENDENT_AMBULATORY_CARE_PROVIDER_SITE_OTHER): Payer: 59 | Admitting: Family Medicine

## 2022-03-15 VITALS — BP 136/78 | HR 77 | Ht 64.0 in | Wt 232.0 lb

## 2022-03-15 DIAGNOSIS — F339 Major depressive disorder, recurrent, unspecified: Secondary | ICD-10-CM | POA: Diagnosis not present

## 2022-03-15 DIAGNOSIS — E7849 Other hyperlipidemia: Secondary | ICD-10-CM

## 2022-03-15 DIAGNOSIS — I10 Essential (primary) hypertension: Secondary | ICD-10-CM

## 2022-03-15 DIAGNOSIS — J4520 Mild intermittent asthma, uncomplicated: Secondary | ICD-10-CM | POA: Diagnosis not present

## 2022-03-15 DIAGNOSIS — Z1231 Encounter for screening mammogram for malignant neoplasm of breast: Secondary | ICD-10-CM

## 2022-03-15 DIAGNOSIS — R69 Illness, unspecified: Secondary | ICD-10-CM | POA: Diagnosis not present

## 2022-03-15 MED ORDER — PRAVASTATIN SODIUM 40 MG PO TABS: 40.0000 mg | ORAL_TABLET | ORAL | 1 refills | Status: DC

## 2022-03-15 MED ORDER — BUPROPION HCL ER (XL) 150 MG PO TB24: 150.0000 mg | ORAL_TABLET | Freq: Every day | ORAL | 1 refills | Status: DC

## 2022-03-15 MED ORDER — PROAIR HFA 108 (90 BASE) MCG/ACT IN AERS: 1.0000 | INHALATION_SPRAY | Freq: Four times a day (QID) | RESPIRATORY_TRACT | 1 refills | Status: DC | PRN

## 2022-03-15 NOTE — Progress Notes (Signed)
Established Patient Office Visit  Subjective:  Patient ID: Breanna Malone, adult    DOB: [birth year]  Age: 42 y.o. MRN: [id number]  CC:  Chief Complaint  Patient presents with   Follow-up    3 month f/u.     HPI Breanna Malone is a 42 y.o. adult with past medical history of hypertension, and hyperlipidemia presents for f/u of  chronic medical conditions. For the details of today's visit, please refer to the assessment and plan.     Past Medical History:  Diagnosis Date   Abnormal Pap smear    Arthritis    Asthma    Asthma    Bipolar 1 disorder ([place])    Bone spur    rib   Bronchitis    BV (bacterial vaginosis) 10/08/2019   8/19 rx flagyl   Carpal tunnel syndrome    Cervical spine pain    Chest pain    Diabetes mellitus without complication (HCC)    Elevated cholesterol    High cholesterol    Hyperlipidemia    Hyperlipoproteinemia    Hypertension    Post traumatic stress disorder    Right ovarian cyst 10/31/2012   Complex right ovarian cyst seen 8/18 in ER need f/u US   Tendonitis    Vaginal Pap smear, abnormal     Past Surgical History:  Procedure Laterality Date   CARPAL TUNNEL RELEASE Right 06/28/2016   Procedure: RIGHT CARPAL TUNNEL RELEASE;  Surgeon: Carole Civil, MD;  Location: [facility];  Service: Orthopedics;  Laterality: Right;   HERNIA REPAIR     umbilical   RIB RESECTION Left 03/16/2021   Procedure: RESECTION OF TUMOR LEFT NINTH RIB;  Surgeon: Melrose Nakayama, MD;  Location: [facility] OR;  Service: Thoracic;  Laterality: Left;    Family History  Problem Relation Age of Onset   Diabetes Mother    Hypertension Mother    Asthma Father    Bronchitis Daughter    Asthma Daughter    Asthma Daughter    Bronchitis Daughter    Asthma Son    Bronchitis Son    Asthma Son    Stroke Paternal Grandmother    Heart attack Paternal Grandmother    Cancer Paternal Grandmother    Cancer Cousin    Asthma Other    Bronchitis Other     Social History    Socioeconomic History   Marital status: Widowed    Spouse name: Not on file   Number of children: Not on file   Years of education: Not on file   Highest education level: Not on file  Occupational History   Not on file  Tobacco Use   Smoking status: Never   Smokeless tobacco: Never  Vaping Use   Vaping Use: Never used  Substance and Sexual Activity   Alcohol use: No   Drug use: No   Sexual activity: Yes    Birth control/protection: Condom  Other Topics Concern   Not on file  Social History Narrative   Not on file   Social Determinants of Health   Financial Resource Strain: Low Risk  (07/27/2020)   Overall Financial Resource Strain (CARDIA)    Difficulty of Paying Living Expenses: Not hard at all  Food Insecurity: No Food Insecurity (07/27/2020)   Hunger Vital Sign    Worried About Running Out of Food in the Last Year: Never true    Ran Out of Food in the Last Year: Never true  Transportation Needs:  Unmet Transportation Needs (07/27/2020)   PRAPARE - Transportation    Lack of Transportation (Medical): No    Lack of Transportation (Non-Medical): Yes  Physical Activity: Insufficiently Active (07/27/2020)   Exercise Vital Sign    Days of Exercise per Week: 2 days    Minutes of Exercise per Session: 30 min  Stress: No Stress Concern Present (07/27/2020)   [place]    Feeling of Stress : Not at all  Social Connections: Moderately Isolated (07/27/2020)   Social Connection and Isolation Panel [NHANES]    Frequency of Communication with Friends and Family: Three times a week    Frequency of Social Gatherings with Friends and Family: Three times a week    Attends Religious Services: 1 to 4 times per year    Active Member of Clubs or Organizations: No    Attends Archivist Meetings: Never    Marital Status: Widowed  Intimate Partner Violence: Not At Risk (07/27/2020)   Humiliation, Afraid, Rape, and Kick  questionnaire    Fear of Current or Ex-Partner: No    Emotionally Abused: No    Physically Abused: No    Sexually Abused: No    Outpatient Medications Prior to Visit  Medication Sig Dispense Refill   gabapentin (NEURONTIN) 300 MG capsule Take 1 capsule (300 mg total) by mouth 3 (three) times daily. 30 capsule 0   ibuprofen (ADVIL) 200 MG tablet Take 400 mg by mouth every 6 (six) hours as needed for moderate pain or headache.     olmesartan (BENICAR) 20 MG tablet Take 1 tablet (20 mg total) by mouth daily. 30 tablet 1   buPROPion (WELLBUTRIN XL) 150 MG 24 hr tablet Take 1 tablet (150 mg total) by mouth daily. 30 tablet 1   pravastatin (PRAVACHOL) 40 MG tablet Take 1 tablet (40 mg total) by mouth 3 (three) times a week. 30 tablet 1   PROAIR HFA 108 (90 Base) MCG/ACT inhaler Inhale 1-2 puffs into the lungs every 6 (six) hours as needed for wheezing. 18 g 1   No facility-administered medications prior to visit.    Allergies  Allergen Reactions   Darvocet [Propoxyphene N-Acetaminophen] Itching   Hydrocodone-Acetaminophen Itching   Lactose Intolerance (Gi) Other (See Comments)    Causes stomach cramping.   Other Hives    Patient is allergic to corndogs.   Penicillins Hives and Itching    Has patient had a PCN reaction causing immediate rash, facial/tongue/throat swelling, SOB or lightheadedness with hypotension:Yes Has patient had a PCN reaction causing severe rash involving mucus membranes or skin necrosis:No Has patient had a PCN reaction that required hospitalization:No Has patient had a PCN reaction occurring within the last 10 years:No If all of the above answers are "NO", then may proceed with Cephalosporin use.    Latex Itching and Rash   Percocet [Oxycodone-Acetaminophen] Itching and Rash    ROS Review of Systems  Constitutional:  Negative for chills and fever.  Eyes:  Negative for visual disturbance.  Respiratory:  Negative for chest tightness and shortness of breath.    Neurological:  Negative for dizziness and headaches.      Objective:    Physical Exam HENT:     Head: Normocephalic.     Mouth/Throat:     Mouth: Mucous membranes are moist.  Cardiovascular:     Rate and Rhythm: Normal rate.     Heart sounds: Normal heart sounds.  Pulmonary:     Effort:  Pulmonary effort is normal.     Breath sounds: Normal breath sounds.  Neurological:     Mental Status: She is alert.     BP 136/78 (BP Location: Left Arm)   Pulse 77   Ht '5\' 4"'$  (1.626 m)   Wt 232 lb 0.6 oz (105.3 kg)   SpO2 96%   BMI 39.83 kg/m  Wt Readings from Last 3 Encounters:  03/15/22 232 lb 0.6 oz (105.3 kg)  01/30/22 234 lb (106.1 kg)  01/18/22 240 lb (108.9 kg)    Lab Results  Component Value Date   TSH 0.669 07/27/2020   Lab Results  Component Value Date   WBC 9.8 01/08/2022   HGB 12.3 01/08/2022   HCT 37.8 01/08/2022   MCV 95.0 01/08/2022   PLT 342 01/08/2022   Lab Results  Component Value Date   NA 137 01/08/2022   K 3.8 01/08/2022   CO2 25 01/08/2022   GLUCOSE 97 01/08/2022   BUN 8 01/08/2022   CREATININE 0.71 01/08/2022   BILITOT 0.4 01/08/2022   ALKPHOS 65 01/08/2022   AST 35 01/08/2022   ALT 43 01/08/2022   PROT 7.5 01/08/2022   ALBUMIN 4.1 01/08/2022   CALCIUM 9.0 01/08/2022   ANIONGAP 6 01/08/2022   EGFR 77 12/13/2021   Lab Results  Component Value Date   CHOL 209 (H) 12/13/2021   Lab Results  Component Value Date   HDL 39 (L) 12/13/2021   Lab Results  Component Value Date   LDLCALC 142 (H) 12/13/2021   Lab Results  Component Value Date   TRIG 152 (H) 12/13/2021   Lab Results  Component Value Date   CHOLHDL 5.4 (H) 12/13/2021   Lab Results  Component Value Date   HGBA1C 6.3 (H) 12/13/2021      Assessment & Plan:  Hypertension, unspecified type Assessment & Plan: Controlled Reports stopping olmesartan 20 mg as instructed by Dr. Court Joy at her last visit on 01/10/2022 She denies headaches, dizziness, blurred vision, chest  pain, palpitation, and shortness of breath Encouraged lifestyle modification with low-sodium diet and increase physical activity   Other hyperlipidemia Assessment & Plan: She takes pravastatin 40 mg daily Denies muscle aches and pain Refill sent to the pharmacy  Orders: -     Pravastatin Sodium; Take 1 tablet (40 mg total) by mouth 3 (three) times a week.  Dispense: 90 tablet; Refill: 1  Episode of recurrent major depressive disorder, unspecified depression episode severity ([place]) Assessment & Plan: Denies suicidal thoughts or ideation Stable on Wellbutrin 150 mg daily Refill sent to the pharmacy  Orders: -     buPROPion HCl ER (XL); Take 1 tablet (150 mg total) by mouth daily.  Dispense: 30 tablet; Refill: 1  Mild intermittent asthma, unspecified whether complicated -     ProAir HFA; Inhale 1-2 puffs into the lungs every 6 (six) hours as needed for wheezing.  Dispense: 18 g; Refill: 1    Follow-up: Return in about 3 months (around 06/14/2022).   Alvira Monday, FNP

## 2022-03-15 NOTE — Patient Instructions (Addendum)
   I appreciate the opportunity to provide care to you today!    Follow up:  3 months  Labs: next visit  Please pick up your medications at the pharmacy  Please schedule Mammogram   Please continue to a heart-healthy diet and increase your physical activities. Try to exercise for 39mns at least five times a week.      It was a pleasure to see you and I look forward to continuing to work together on your health and well-being. Please do not hesitate to call the office if you need care or have questions about your care.   Have a wonderful day and week. With Gratitude, GAlvira MondayMSN, FNP-BC

## 2022-03-16 NOTE — Assessment & Plan Note (Signed)
Denies suicidal thoughts or ideation Stable on Wellbutrin 150 mg daily Refill sent to the pharmacy

## 2022-03-16 NOTE — Assessment & Plan Note (Signed)
Controlled Reports stopping olmesartan 20 mg as instructed by Dr. Court Joy at her last visit on 01/10/2022 She denies headaches, dizziness, blurred vision, chest pain, palpitation, and shortness of breath Encouraged lifestyle modification with low-sodium diet and increase physical activity

## 2022-03-16 NOTE — Assessment & Plan Note (Signed)
She takes pravastatin 40 mg daily Denies muscle aches and pain Refill sent to the pharmacy

## 2022-03-26 ENCOUNTER — Ambulatory Visit (HOSPITAL_COMMUNITY): Payer: 59

## 2022-04-03 ENCOUNTER — Encounter: Payer: Self-pay | Admitting: Thoracic Surgery (Cardiothoracic Vascular Surgery)

## 2022-04-03 ENCOUNTER — Ambulatory Visit: Payer: 59 | Admitting: Thoracic Surgery (Cardiothoracic Vascular Surgery)

## 2022-04-13 ENCOUNTER — Other Ambulatory Visit: Payer: Self-pay | Admitting: Family Medicine

## 2022-04-18 ENCOUNTER — Ambulatory Visit: Payer: Medicaid Other | Admitting: Family Medicine

## 2022-04-19 ENCOUNTER — Encounter: Payer: Self-pay | Admitting: *Deleted

## 2022-04-19 NOTE — Progress Notes (Signed)
Today was the final assessment day of PREP Class for Breanna Malone. I have reached out to her by text and phone several times during the last several weeks of class without response. Breanna Malone attended 1 of 11 educational classes and 5 of 11 exercise sessions. She did report some transportation and childcare issues.

## 2022-05-11 ENCOUNTER — Other Ambulatory Visit: Payer: Self-pay | Admitting: Family Medicine

## 2022-05-11 DIAGNOSIS — F339 Major depressive disorder, recurrent, unspecified: Secondary | ICD-10-CM

## 2022-05-11 DIAGNOSIS — E7849 Other hyperlipidemia: Secondary | ICD-10-CM

## 2022-06-06 ENCOUNTER — Telehealth: Payer: Self-pay | Admitting: Adult Health

## 2022-06-06 NOTE — Telephone Encounter (Signed)
Pt had period on 3/24-3/28. Pt had unprotected sex. Started bleeding again 4/16. Not heavy. Pt is not on any birth control. Pt advised to schedule an appt to see provider. Call transferred to [facility] for appt. JSY

## 2022-06-06 NOTE — Telephone Encounter (Signed)
Patient called stating that she started her period on 3/24 stopped on 3/28  and started back again on 4/16. She is wanting to speak to some one about it and wants to know if she should be worried. I did make her appointment for her annual.

## 2022-06-11 ENCOUNTER — Other Ambulatory Visit: Payer: Self-pay | Admitting: Family Medicine

## 2022-06-11 DIAGNOSIS — J4520 Mild intermittent asthma, uncomplicated: Secondary | ICD-10-CM

## 2022-06-13 ENCOUNTER — Other Ambulatory Visit (HOSPITAL_COMMUNITY)
Admission: RE | Admit: 2022-06-13 | Discharge: 2022-06-13 | Disposition: A | Discharge status: Home / Self Care | Payer: 59 | Source: Ambulatory Visit | Attending: Adult Health | Admitting: Adult Health

## 2022-06-13 ENCOUNTER — Ambulatory Visit (INDEPENDENT_AMBULATORY_CARE_PROVIDER_SITE_OTHER): Payer: 59 | Admitting: Adult Health

## 2022-06-13 ENCOUNTER — Encounter: Payer: Self-pay | Admitting: Adult Health

## 2022-06-13 VITALS — BP 143/95 | HR 71 | Ht 64.0 in | Wt 231.5 lb

## 2022-06-13 DIAGNOSIS — R3989 Other symptoms and signs involving the genitourinary system: Secondary | ICD-10-CM

## 2022-06-13 DIAGNOSIS — Z3202 Encounter for pregnancy test, result negative: Secondary | ICD-10-CM | POA: Diagnosis not present

## 2022-06-13 DIAGNOSIS — N898 Other specified noninflammatory disorders of vagina: Secondary | ICD-10-CM | POA: Insufficient documentation

## 2022-06-13 DIAGNOSIS — K429 Umbilical hernia without obstruction or gangrene: Secondary | ICD-10-CM | POA: Diagnosis not present

## 2022-06-13 DIAGNOSIS — I10 Essential (primary) hypertension: Secondary | ICD-10-CM | POA: Diagnosis not present

## 2022-06-13 DIAGNOSIS — N926 Irregular menstruation, unspecified: Secondary | ICD-10-CM

## 2022-06-13 LAB — POCT URINALYSIS DIPSTICK
Glucose, UA: NEGATIVE
Ketones, UA: NEGATIVE
Leukocytes, UA: NEGATIVE
Nitrite, UA: NEGATIVE
Protein, UA: POSITIVE — AB

## 2022-06-13 LAB — POCT URINE PREGNANCY: Preg Test, Ur: NEGATIVE

## 2022-06-13 NOTE — Progress Notes (Signed)
  Subjective:     Patient ID: Breanna Malone, adult   DOB: [birth year], 42 y.o.   MRN: [id number]  HPI Breanna Malone is a 42 year old black female, widowed, [id number] in complaining of having irregular periods, and urine is dark Had period 3/24 and again 06/05/22.  Last pap was 11/27/2018, negative HPV,NILM has pap and physical scheduled for 07/02/22.  PCP is Gilmore Laroche NP  Review of Systems + irregular periods Urine is dark   Is having sex Has pain near navel Reviewed past medical,surgical, social and family history. Reviewed medications and allergies.  Objective:   Physical Exam BP (!) 143/95 (BP Location: Right Arm, Patient Position: Sitting, Cuff Size: Large)   Pulse 71   Ht  (1.626 m)   Wt 231 lb 8 oz (105 kg)   LMP 06/05/2022   BMI 39.74 kg/m  UPT is negative. Urine dipstick trace blood and small protein. Skin warm and dry. Abdomen is soft, tender near umbilical area, +small hernia noted when coughs.  Pelvic: external genitalia is normal in appearance no lesions, vagina: white discharge without odor,urethra has no lesions or masses noted, cervix:smooth and bulbous, uterus: normal size, shape and contour, non tender, no masses felt, adnexa: no masses or tenderness noted. Bladder is non tender and no masses felt. CV swab obtained.   Fall risk is low  [facility] - 06/13/22 1137       Pregnancy Intention Screening   Does the patient want to become pregnant in the next year? No    Does the patient's partner want to become pregnant in the next year? No    Would the patient like to discuss contraceptive options today? No      Contraception Wrap Up   Current Method No Method - Other Reason    Reason for No Current Contraceptive Method at Intake ([facility] Only) Other    End Method Female Condom            Examination chaperoned by Malachy Mood LPN  Assessment:     1. Abnormal urine color Drink more water  - POCT Urinalysis Dipstick  2. Pregnancy examination or test, negative  result - POCT urine pregnancy  3. Irregular periods Had period 05/13/22 and again 06/05/22 Discussed that period cycle vary, and if between 21-35 days from first day to first day that is normal range  Keep period log  4. Vaginal discharge CV swab sent for GC/CHL,trich, BV and yeast  - Cervicovaginal ancillary only( [place])  5. Umbilical hernia without obstruction and without gangrene Has small umbilical hernia, had repair as a child   6. Hypertension, unspecified type Need to follow up with PCP about BP, since meds stopped by them     Plan:     Return 07/02/22 as scheduled for pap and physical with Joellyn Haff CNM

## 2022-06-14 ENCOUNTER — Ambulatory Visit: Payer: 59 | Admitting: Family Medicine

## 2022-06-14 ENCOUNTER — Other Ambulatory Visit: Payer: Self-pay | Admitting: Adult Health

## 2022-06-14 ENCOUNTER — Telehealth: Payer: Self-pay | Admitting: *Deleted

## 2022-06-14 LAB — CERVICOVAGINAL ANCILLARY ONLY
Bacterial Vaginitis (gardnerella): NEGATIVE
Candida Glabrata: NEGATIVE
Candida Vaginitis: POSITIVE — AB
Chlamydia: NEGATIVE
Comment: NEGATIVE
Comment: NEGATIVE
Comment: NEGATIVE
Comment: NEGATIVE
Comment: NEGATIVE
Comment: NORMAL
Neisseria Gonorrhea: NEGATIVE
Trichomonas: NEGATIVE

## 2022-06-14 MED ORDER — FLUCONAZOLE 150 MG PO TABS: ORAL_TABLET | ORAL | 1 refills | Status: DC

## 2022-06-14 NOTE — Progress Notes (Signed)
+  yeast on vaginal swab, rx sent for diflucan

## 2022-06-14 NOTE — Telephone Encounter (Signed)
-----   Message from Adline Potter, NP sent at 06/14/2022  1:38 PM EDT ----- Let Breanna Malone know +yeast on vaginal swab and rx sent for diflucan

## 2022-06-14 NOTE — Telephone Encounter (Signed)
Voicemail not set up @ 4:32 pm. JSY

## 2022-06-15 ENCOUNTER — Encounter: Payer: Self-pay | Admitting: Family Medicine

## 2022-06-15 NOTE — Telephone Encounter (Signed)
Pt aware yeast showed up on vaginal swab and Diflucan was sent to pharmacy. Pt voiced understanding. JSY

## 2022-06-20 ENCOUNTER — Ambulatory Visit (HOSPITAL_COMMUNITY): Payer: 59

## 2022-07-02 ENCOUNTER — Encounter: Payer: 59 | Admitting: Women's Health

## 2022-07-02 ENCOUNTER — Telehealth: Payer: Self-pay | Admitting: Family Medicine

## 2022-07-02 ENCOUNTER — Ambulatory Visit (INDEPENDENT_AMBULATORY_CARE_PROVIDER_SITE_OTHER): Payer: 59 | Admitting: *Deleted

## 2022-07-02 VITALS — BP 155/90 | HR 90 | Ht 64.0 in | Wt 229.8 lb

## 2022-07-02 DIAGNOSIS — Z3042 Encounter for surveillance of injectable contraceptive: Secondary | ICD-10-CM

## 2022-07-02 LAB — POCT URINE PREGNANCY: Preg Test, Ur: NEGATIVE

## 2022-07-02 MED ORDER — MEDROXYPROGESTERONE ACETATE 150 MG/ML IM SUSP: 150.0000 mg | Freq: Once | INTRAMUSCULAR | Status: DC

## 2022-07-02 MED ORDER — MEDROXYPROGESTERONE ACETATE 150 MG/ML IM SUSY
150.0000 mg | PREFILLED_SYRINGE | Freq: Once | INTRAMUSCULAR | Status: AC
  Administered 2022-07-02: 150 mg via INTRAMUSCULAR

## 2022-07-02 NOTE — Progress Notes (Signed)
   NURSE VISIT- INJECTION  SUBJECTIVE:  Breanna Malone is a 42 y.o. [phone] female here for a Depo Provera for contraception/period management. She is a GYN patient.   OBJECTIVE:  BP (!) 150/95 (BP Location: Right Arm, Patient Position: Sitting, Cuff Size: Normal)   Pulse 71   Ht 5\' 4"  (1.626 m)   Wt 229 lb 12.8 oz (104.2 kg)   LMP 07/01/2022   BMI 39.45 kg/m   Appears well, in no apparent distress  Injection administered in: Right deltoid  Meds ordered this encounter  Medications   medroxyPROGESTERone (DEPO-PROVERA) injection 150 mg    ASSESSMENT: GYN patient Depo Provera for contraception/period management PLAN: Follow-up: in 11-13 weeks for next Depo and for annual exam due for pap   Annamarie Dawley  07/02/2022 11:11 AM

## 2022-07-02 NOTE — Telephone Encounter (Signed)
FL2   Noted  Copied Sleeved   Original in pcp box copy in friont desk folder

## 2022-07-03 NOTE — Progress Notes (Signed)
Pt scheduled for pap & physical, on period and doesn't want to do today. Appt cancelled and rescheduled. Not seen by provider. Did have nurse visit for depo.  Cheral Marker, CNM, WHNP-BC This encounter was created in error - please disregard.

## 2022-07-03 NOTE — Patient Instructions (Signed)

## 2022-07-04 ENCOUNTER — Encounter: Payer: Self-pay | Admitting: Family Medicine

## 2022-07-04 ENCOUNTER — Ambulatory Visit (INDEPENDENT_AMBULATORY_CARE_PROVIDER_SITE_OTHER): Payer: 59 | Admitting: Family Medicine

## 2022-07-04 VITALS — BP 138/84 | HR 70 | Resp 16 | Ht 64.0 in | Wt 230.0 lb

## 2022-07-04 DIAGNOSIS — Z13220 Encounter for screening for lipoid disorders: Secondary | ICD-10-CM

## 2022-07-04 DIAGNOSIS — E538 Deficiency of other specified B group vitamins: Secondary | ICD-10-CM | POA: Diagnosis not present

## 2022-07-04 DIAGNOSIS — Z131 Encounter for screening for diabetes mellitus: Secondary | ICD-10-CM

## 2022-07-04 DIAGNOSIS — F419 Anxiety disorder, unspecified: Secondary | ICD-10-CM

## 2022-07-04 DIAGNOSIS — Z1329 Encounter for screening for other suspected endocrine disorder: Secondary | ICD-10-CM

## 2022-07-04 DIAGNOSIS — J4520 Mild intermittent asthma, uncomplicated: Secondary | ICD-10-CM | POA: Diagnosis not present

## 2022-07-04 DIAGNOSIS — G8929 Other chronic pain: Secondary | ICD-10-CM

## 2022-07-04 DIAGNOSIS — M25512 Pain in left shoulder: Secondary | ICD-10-CM | POA: Diagnosis not present

## 2022-07-04 DIAGNOSIS — I10 Essential (primary) hypertension: Secondary | ICD-10-CM

## 2022-07-04 DIAGNOSIS — E559 Vitamin D deficiency, unspecified: Secondary | ICD-10-CM

## 2022-07-04 DIAGNOSIS — F339 Major depressive disorder, recurrent, unspecified: Secondary | ICD-10-CM | POA: Diagnosis not present

## 2022-07-04 MED ORDER — OLMESARTAN MEDOXOMIL 20 MG PO TABS: 20.0000 mg | ORAL_TABLET | Freq: Every day | ORAL | 1 refills | Status: DC

## 2022-07-04 MED ORDER — CYCLOBENZAPRINE HCL 5 MG PO TABS: 5.0000 mg | ORAL_TABLET | Freq: Three times a day (TID) | ORAL | 1 refills | Status: DC | PRN

## 2022-07-04 MED ORDER — ALBUTEROL SULFATE HFA 108 (90 BASE) MCG/ACT IN AERS: INHALATION_SPRAY | RESPIRATORY_TRACT | 0 refills | Status: DC

## 2022-07-04 MED ORDER — BUPROPION HCL ER (XL) 150 MG PO TB24: 150.0000 mg | ORAL_TABLET | Freq: Every day | ORAL | 0 refills | Status: DC

## 2022-07-04 MED ORDER — GABAPENTIN 300 MG PO CAPS: 300.0000 mg | ORAL_CAPSULE | Freq: Three times a day (TID) | ORAL | 0 refills | Status: DC | PRN

## 2022-07-04 NOTE — Assessment & Plan Note (Signed)
Flowsheet Row Office Visit from 07/04/2022 in [facility]  PHQ-9 Total Score 3     Refilled Wellbutrin 150 mg Discussed about cognitive behavioral therapy focusing on thoughts, belief, and attitudes that affects feelings and behavior, learning about coping skills to deal with certain problems. Maintaining a consistent routine and schedule, Practice stress management and self calming techniques, excersise regularly and spend time outdoors, Do not eat food that are high in fat, added sugar, or salt. Follow up in 6 weeks Referral to behavior health    Patient  verbally consented to [facility] services about presenting concerns and psychiatric consultation as appropriate. The services will be billed as appropriate for the patient.

## 2022-07-04 NOTE — Assessment & Plan Note (Signed)
Started Flexeril 5 mg PRN Explained to patient Non pharmacological interventions include the use of ice or heat, rest, recommend range of motion exercises, gentle stretching.Follow up for worsening or persistent symptoms. Patient verbalizes understanding regarding plan of care and all questions answered.

## 2022-07-04 NOTE — Assessment & Plan Note (Signed)
Vitals:   07/04/22 1111 07/04/22 1116  BP: (!) 142/84 138/84   Blood pressure not controlled in today's visit Patient reported not taking olmesartan and would like to start again Refilled Olmesartan 20 mg in today's visit Explained the importance of medication compliance  Continued discussion on DASH diet, low sodium diet and maintain a exercise routine for 150 minutes per week.

## 2022-07-04 NOTE — Progress Notes (Signed)
Patient Office Visit   Subjective   Patient ID: Breanna Malone, adult    DOB: 03-30-1980  Age: 42 y.o. MRN: [id number]  CC:  Chief Complaint  Patient presents with   Hypertension    Follow up visit     HPI Breanna Malone 42 year old female, presents to the clinic for chronic follow up She  has a past medical history of Abnormal Pap smear, Arthritis, Asthma, Asthma, Bipolar 1 disorder (HCC), Bone spur, Bronchitis, BV (bacterial vaginosis) (10/08/2019), Carpal tunnel syndrome, Cervical spine pain, Chest pain, Diabetes mellitus without complication (HCC), Elevated cholesterol, High cholesterol, Hyperlipidemia, Hyperlipoproteinemia, Hypertension, Post traumatic stress disorder, Right ovarian cyst (10/31/2012), Tendonitis, and Vaginal Pap smear, abnormal.  Shoulder Pain  The pain is present in the left arm and left shoulder. This is a recurrent problem. The current episode started more than 1 month ago. Patient reports arm pain occurs constantly and has been gradually worsening. The quality of the pain is described as aching and burning. The pain is at a severity of 8/10. Associated symptoms include an inability to bear weight, a limited range of motion and stiffness. The symptoms are aggravated by activity. She has tried NSAIDS for the symptoms. The treatment provided no relief. Family history does not include rheumatoid arthritis. There is no history of osteoarthritis or rheumatoid arthritis.        Outpatient Encounter Medications as of 07/04/2022  Medication Sig   cyclobenzaprine (FLEXERIL) 5 MG tablet Take 1 tablet (5 mg total) by mouth 3 (three) times daily as needed for muscle spasms.   Multiple Vitamin (MULTIVITAMIN) capsule Take 1 capsule by mouth daily.   UNABLE TO FIND RES-Q 105 max 1 daily.   [DISCONTINUED] albuterol (VENTOLIN HFA) 108 (90 Base) MCG/ACT inhaler INHALE 1 OR 2 PUFFS INTO THE LUNGS EVERY 6 HOURS AS NEEDED FOR WHEEZING.   [DISCONTINUED] buPROPion (WELLBUTRIN XL)  150 MG 24 hr tablet TAKE ONE TABLET BY MOUTH ONCE DAILY.   [DISCONTINUED] fluconazole (DIFLUCAN) 150 MG tablet Take 1 now and 1 in 3 days   albuterol (VENTOLIN HFA) 108 (90 Base) MCG/ACT inhaler INHALE 1 OR 2 PUFFS INTO THE LUNGS EVERY 6 HOURS AS NEEDED FOR WHEEZING.   buPROPion (WELLBUTRIN XL) 150 MG 24 hr tablet Take 1 tablet (150 mg total) by mouth daily.   gabapentin (NEURONTIN) 300 MG capsule Take 1 capsule (300 mg total) by mouth 3 (three) times daily as needed.   olmesartan (BENICAR) 20 MG tablet Take 1 tablet (20 mg total) by mouth daily.   pravastatin (PRAVACHOL) 40 MG tablet TAKE (1) TABLET BY MOUTH THREE TIMES A WEEK. (Patient not taking: Reported on 06/13/2022)   [DISCONTINUED] gabapentin (NEURONTIN) 300 MG capsule Take 1 capsule (300 mg total) by mouth 3 (three) times daily. (Patient not taking: Reported on 06/13/2022)   [DISCONTINUED] olmesartan (BENICAR) 20 MG tablet Take 1 tablet (20 mg total) by mouth daily. (Patient not taking: Reported on 06/13/2022)   No facility-administered encounter medications on file as of 07/04/2022.    Past Surgical History:  Procedure Laterality Date   CARPAL TUNNEL RELEASE Right 06/28/2016   Procedure: RIGHT CARPAL TUNNEL RELEASE;  Surgeon: Vickki Hearing, MD;  Location: [facility];  Service: Orthopedics;  Laterality: Right;   HERNIA REPAIR     umbilical   RIB RESECTION Left 03/16/2021   Procedure: RESECTION OF TUMOR LEFT NINTH RIB;  Surgeon: Loreli Slot, MD;  Location: [facility] OR;  Service: Thoracic;  Laterality: Left;  Review of Systems  Constitutional:  Negative for chills and fever.  Eyes:  Negative for blurred vision.  Respiratory:  Negative for shortness of breath.   Cardiovascular:  Negative for chest pain.  Gastrointestinal:  Negative for nausea and vomiting.  Musculoskeletal:  Positive for joint pain and myalgias.  Skin:  Negative for rash.  Neurological:  Negative for dizziness and headaches.      Objective    BP 138/84    Pulse 70   Resp 16   Ht 5\' 4"  (1.626 m)   Wt 230 lb (104.3 kg)   LMP 07/01/2022   SpO2 96%   BMI 39.48 kg/m   Physical Exam Vitals reviewed.  Constitutional:      General: She is not in acute distress.    Appearance: Normal appearance. She is not ill-appearing, toxic-appearing or diaphoretic.  HENT:     Head: Normocephalic.  Eyes:     General:        Right eye: No discharge.        Left eye: No discharge.     Conjunctiva/sclera: Conjunctivae normal.  Cardiovascular:     Rate and Rhythm: Normal rate.     Pulses: Normal pulses.     Heart sounds: Normal heart sounds.  Pulmonary:     Effort: Pulmonary effort is normal. No respiratory distress.     Breath sounds: Normal breath sounds.  Abdominal:     General: Bowel sounds are normal.     Palpations: Abdomen is soft.     Tenderness: There is no abdominal tenderness. There is no guarding.  Musculoskeletal:        General: Normal range of motion.     Cervical back: Normal range of motion.  Skin:    General: Skin is warm and dry.     Capillary Refill: Capillary refill takes less than 2 seconds.  Neurological:     General: No focal deficit present.     Mental Status: She is alert and oriented to person, place, and time.     Coordination: Coordination normal.     Gait: Gait normal.  Psychiatric:        Mood and Affect: Mood normal.        Behavior: Behavior normal.       Assessment & Plan:  Screening for diabetes mellitus -     Hemoglobin A1c  Primary hypertension Assessment & Plan: Vitals:   07/04/22 1111 07/04/22 1116  BP: (!) 142/84 138/84   Blood pressure not controlled in today's visit Patient reported not taking olmesartan and would like to start again Refilled Olmesartan 20 mg in today's visit Explained the importance of medication compliance  Continued discussion on DASH diet, low sodium diet and maintain a exercise routine for 150 minutes per week.   Orders: -     Microalbumin / creatinine urine ratio -      CMP14+EGFR -     CBC with Differential/Platelet  Screening for lipid disorders -     Lipid panel  Vitamin D deficiency -     VITAMIN D 25 Hydroxy (Vit-D Deficiency, Fractures)  Screening for thyroid disorder -     TSH + free T4  Mild intermittent asthma, unspecified whether complicated -     Albuterol Sulfate HFA; INHALE 1 OR 2 PUFFS INTO THE LUNGS EVERY 6 HOURS AS NEEDED FOR WHEEZING.  Dispense: 8.5 g; Refill: 0  Episode of recurrent major depressive disorder, unspecified depression episode severity [facility]) Assessment & Plan: [employer]  Visit from 07/04/2022 in [facility]  PHQ-9 Total Score 3     Refilled Wellbutrin 150 mg Discussed about cognitive behavioral therapy focusing on thoughts, belief, and attitudes that affects feelings and behavior, learning about coping skills to deal with certain problems. Maintaining a consistent routine and schedule, Practice stress management and self calming techniques, excersise regularly and spend time outdoors, Do not eat food that are high in fat, added sugar, or salt. Follow up in 6 weeks Referral to behavior health    Patient  verbally consented to [facility] services about presenting concerns and psychiatric consultation as appropriate. The services will be billed as appropriate for the patient.    Orders: -     buPROPion HCl ER (XL); Take 1 tablet (150 mg total) by mouth daily.  Dispense: 30 tablet; Refill: 0  Vitamin B12 deficiency -     Vitamin B12  Anxiety and depression -     Ambulatory referral to Behavioral Health  Chronic left shoulder pain Assessment & Plan: Started Flexeril 5 mg PRN Explained to patient Non pharmacological interventions include the use of ice or heat, rest, recommend range of motion exercises, gentle stretching.Follow up for worsening or persistent symptoms. Patient verbalizes understanding regarding plan of care and all questions answered.     Other orders -     Gabapentin; Take 1 capsule (300 mg total) by mouth 3 (three) times daily as needed.  Dispense: 30 capsule; Refill: 0 -     Olmesartan Medoxomil; Take 1 tablet (20 mg total) by mouth daily.  Dispense: 30 tablet; Refill: 1 -     Cyclobenzaprine HCl; Take 1 tablet (5 mg total) by mouth 3 (three) times daily as needed for muscle spasms.  Dispense: 30 tablet; Refill: 1    Return in about 4 weeks (around 08/01/2022), or if symptoms worsen or fail to improve, for Anxiety, arm pain, abdominal pain.   Cruzita Lederer Newman Nip, FNP

## 2022-07-12 ENCOUNTER — Telehealth: Payer: Self-pay | Admitting: Family Medicine

## 2022-07-12 DIAGNOSIS — Z1329 Encounter for screening for other suspected endocrine disorder: Secondary | ICD-10-CM | POA: Diagnosis not present

## 2022-07-12 DIAGNOSIS — Z131 Encounter for screening for diabetes mellitus: Secondary | ICD-10-CM | POA: Diagnosis not present

## 2022-07-12 DIAGNOSIS — I10 Essential (primary) hypertension: Secondary | ICD-10-CM | POA: Diagnosis not present

## 2022-07-12 DIAGNOSIS — E559 Vitamin D deficiency, unspecified: Secondary | ICD-10-CM | POA: Diagnosis not present

## 2022-07-12 DIAGNOSIS — Z13220 Encounter for screening for lipoid disorders: Secondary | ICD-10-CM | POA: Diagnosis not present

## 2022-07-12 NOTE — Telephone Encounter (Signed)
Pt called in regard to last visit on 5/15 States arm is not getting better wants to have xray orders placed  Wants a call back

## 2022-07-13 ENCOUNTER — Institutional Professional Consult (permissible substitution): Payer: 59 | Admitting: Professional Counselor

## 2022-07-13 NOTE — Telephone Encounter (Signed)
that's fine

## 2022-07-14 LAB — CMP14+EGFR
ALT: 38 IU/L — ABNORMAL HIGH (ref 0–32)
AST: 28 IU/L (ref 0–40)
Albumin/Globulin Ratio: 1.6 (ref 1.2–2.2)
Albumin: 4.5 g/dL (ref 3.9–4.9)
Alkaline Phosphatase: 79 IU/L (ref 44–121)
BUN/Creatinine Ratio: 9 (ref 9–23)
BUN: 7 mg/dL (ref 6–24)
Bilirubin Total: 0.7 mg/dL (ref 0.0–1.2)
CO2: 22 mmol/L (ref 20–29)
Calcium: 9.6 mg/dL (ref 8.7–10.2)
Chloride: 101 mmol/L (ref 96–106)
Creatinine, Ser: 0.82 mg/dL (ref 0.57–1.00)
Globulin, Total: 2.8 g/dL (ref 1.5–4.5)
Glucose: 90 mg/dL (ref 70–99)
Potassium: 4 mmol/L (ref 3.5–5.2)
Sodium: 138 mmol/L (ref 134–144)
Total Protein: 7.3 g/dL (ref 6.0–8.5)
eGFR: 91 mL/min/{1.73_m2} (ref >59)

## 2022-07-14 LAB — HEMOGLOBIN A1C
Est. average glucose Bld gHb Est-mCnc: 128 mg/dL
Hgb A1c MFr Bld: 6.1 % — ABNORMAL HIGH (ref 4.8–5.6)

## 2022-07-14 LAB — CBC WITH DIFFERENTIAL/PLATELET
Basophils Absolute: 0 10*3/uL (ref 0.0–0.2)
Basos: 1 %
EOS (ABSOLUTE): 0.1 10*3/uL (ref 0.0–0.4)
Eos: 1 %
Hematocrit: 36.5 % (ref 34.0–46.6)
Hemoglobin: 12.2 g/dL (ref 11.1–15.9)
Immature Grans (Abs): 0 10*3/uL (ref 0.0–0.1)
Immature Granulocytes: 0 %
Lymphocytes Absolute: 3.6 10*3/uL — ABNORMAL HIGH (ref 0.7–3.1)
Lymphs: 42 %
MCH: 30.5 pg (ref 26.6–33.0)
MCHC: 33.4 g/dL (ref 31.5–35.7)
MCV: 91 fL (ref 79–97)
Monocytes Absolute: 0.5 10*3/uL (ref 0.1–0.9)
Monocytes: 5 %
Neutrophils Absolute: 4.4 10*3/uL (ref 1.4–7.0)
Neutrophils: 51 %
Platelets: 349 10*3/uL (ref 150–450)
RBC: 4 x10E6/uL (ref 3.77–5.28)
RDW: 12.1 % (ref 11.7–15.4)
WBC: 8.6 10*3/uL (ref 3.4–10.8)

## 2022-07-14 LAB — MICROALBUMIN / CREATININE URINE RATIO
Creatinine, Urine: 243.8 mg/dL
Microalb/Creat Ratio: 55 mg/g creat — ABNORMAL HIGH (ref 0–29)
Microalbumin, Urine: 132.9 ug/mL

## 2022-07-14 LAB — LIPID PANEL
Chol/HDL Ratio: 6 ratio — ABNORMAL HIGH (ref 0.0–4.4)
Cholesterol, Total: 271 mg/dL — ABNORMAL HIGH (ref 100–199)
HDL: 45 mg/dL (ref >39)
LDL Chol Calc (NIH): 205 mg/dL — ABNORMAL HIGH (ref 0–99)
Triglycerides: 115 mg/dL (ref 0–149)
VLDL Cholesterol Cal: 21 mg/dL (ref 5–40)

## 2022-07-14 LAB — TSH+FREE T4
Free T4: 0.98 ng/dL (ref 0.82–1.77)
TSH: 0.756 u[IU]/mL (ref 0.450–4.500)

## 2022-07-14 LAB — VITAMIN D 25 HYDROXY (VIT D DEFICIENCY, FRACTURES): Vit D, 25-Hydroxy: 20.5 ng/mL — ABNORMAL LOW (ref 30.0–100.0)

## 2022-07-17 ENCOUNTER — Other Ambulatory Visit: Payer: Self-pay

## 2022-07-17 DIAGNOSIS — G8929 Other chronic pain: Secondary | ICD-10-CM

## 2022-07-17 NOTE — Telephone Encounter (Signed)
Order has been put in 

## 2022-07-17 NOTE — Telephone Encounter (Signed)
Pt informed order has been put in

## 2022-07-19 ENCOUNTER — Ambulatory Visit (HOSPITAL_COMMUNITY)
Admission: RE | Admit: 2022-07-19 | Discharge: 2022-07-19 | Disposition: A | Discharge status: Home / Self Care | Payer: 59 | Source: Ambulatory Visit | Attending: Family Medicine | Admitting: Family Medicine

## 2022-07-19 DIAGNOSIS — M25512 Pain in left shoulder: Secondary | ICD-10-CM | POA: Insufficient documentation

## 2022-07-19 DIAGNOSIS — G8929 Other chronic pain: Secondary | ICD-10-CM | POA: Insufficient documentation

## 2022-07-20 ENCOUNTER — Other Ambulatory Visit: Payer: Self-pay | Admitting: Family Medicine

## 2022-07-20 MED ORDER — ROSUVASTATIN CALCIUM 40 MG PO TABS: 40.0000 mg | ORAL_TABLET | Freq: Every day | ORAL | 3 refills | Status: DC

## 2022-07-20 NOTE — Progress Notes (Signed)
Please inform patient,  Urine mircoalb / Creat Ratio levels slightly elevated , Hemoglobin A1c 6.1 indicates pre-diabetes no medication changes just lifestyle changes    I advise to keep your hypertension controlled, maintain blood pressure reading goals under 130/80, take your daily blood pressure medications.  Keep your cholesterol under control to prevent further damage to blood vessels. Avoid NSAIDs medications and take tylenol for pain management.Consume a kidney friendly diet which includes Veggies: cauliflower, onions, eggplant, turnips. Low sodium, low to moderate intake of proteins: lean meats (poultry, fish), eggs, unsalted seafood. Avoid fatty foods, limit or avoid smoking and alcohol intake. Maintain an excercise routine to minimum of 150 minuties a week. Follow up in 3-4 months to recheck labs.    Cholesterol levels elevated- Plan of treatment will include Crestor 40 mg daily - medication sent to pharmacy   Vitamin D levels low, I advise to taking  over the counter supplements of vitamin D [phone] IU/day to prevent low vitamin D levels. Consuming Vitamin D rich food sources include fish, salmon, sardines, egg yolks, red meat, liver, oranges, soy milk.

## 2022-07-22 NOTE — Progress Notes (Signed)
Please inform the patient that her imaging study of the left shoulder showed no abnormalities or significant finding. The results are consider normal.

## 2022-08-01 ENCOUNTER — Telehealth: Payer: Self-pay | Admitting: Family Medicine

## 2022-08-01 ENCOUNTER — Encounter: Payer: Self-pay | Admitting: Family Medicine

## 2022-08-01 ENCOUNTER — Ambulatory Visit (INDEPENDENT_AMBULATORY_CARE_PROVIDER_SITE_OTHER): Payer: Medicaid Other | Admitting: Family Medicine

## 2022-08-01 VITALS — BP 128/85 | HR 83 | Ht 64.0 in | Wt 227.0 lb

## 2022-08-01 DIAGNOSIS — M25512 Pain in left shoulder: Secondary | ICD-10-CM | POA: Diagnosis not present

## 2022-08-01 DIAGNOSIS — M25561 Pain in right knee: Secondary | ICD-10-CM | POA: Diagnosis not present

## 2022-08-01 DIAGNOSIS — I10 Essential (primary) hypertension: Secondary | ICD-10-CM

## 2022-08-01 DIAGNOSIS — G8929 Other chronic pain: Secondary | ICD-10-CM | POA: Diagnosis not present

## 2022-08-01 DIAGNOSIS — E7849 Other hyperlipidemia: Secondary | ICD-10-CM | POA: Diagnosis not present

## 2022-08-01 MED ORDER — ROSUVASTATIN CALCIUM 40 MG PO TABS
40.0000 mg | ORAL_TABLET | Freq: Every day | ORAL | 3 refills | Status: DC
Start: 2022-08-01 — End: 2022-10-31

## 2022-08-01 MED ORDER — NAPROXEN 500 MG PO TABS
500.0000 mg | ORAL_TABLET | Freq: Two times a day (BID) | ORAL | 0 refills | Status: AC
Start: 2022-08-01 — End: 2022-08-15

## 2022-08-01 NOTE — Progress Notes (Signed)
Established Patient Office Visit  Subjective:  Patient ID: Breanna Malone, adult    DOB: [birth year]  Age: 42 y.o. MRN: [id number]  CC:  Chief Complaint  Patient presents with   Chronic Care Management    4 week f/u for anxiety, arm pain, and abdominal pain.    Knee Pain    Pt reports knee swelling and some pain ongoing for 3-4 days now.     HPI Breanna Malone is a 42 y.o. adult with past medical history of hypertension, hyperlipidemia, major depressive disorder, and chronic left shoulder pain presents for f/u of  chronic medical conditions. For the details of today's visit, please refer to the assessment and plan.     Past Medical History:  Diagnosis Date   Abnormal Pap smear    Arthritis    Asthma    Asthma    Bipolar 1 disorder (HCC)    Bone spur    rib   Bronchitis    BV (bacterial vaginosis) 10/08/2019   8/19 rx flagyl   Carpal tunnel syndrome    Cervical spine pain    Chest pain    Diabetes mellitus without complication (HCC)    Elevated cholesterol    High cholesterol    Hyperlipidemia    Hyperlipoproteinemia    Hypertension    Post traumatic stress disorder    Right ovarian cyst 10/31/2012   Complex right ovarian cyst seen 8/18 in ER need f/u US   Tendonitis    Vaginal Pap smear, abnormal     Past Surgical History:  Procedure Laterality Date   CARPAL TUNNEL RELEASE Right 06/28/2016   Procedure: RIGHT CARPAL TUNNEL RELEASE;  Surgeon: Vickki Hearing, MD;  Location: [facility];  Service: Orthopedics;  Laterality: Right;   HERNIA REPAIR     umbilical   RIB RESECTION Left 03/16/2021   Procedure: RESECTION OF TUMOR LEFT NINTH RIB;  Surgeon: Loreli Slot, MD;  Location: [facility] OR;  Service: Thoracic;  Laterality: Left;    Family History  Problem Relation Age of Onset   Diabetes Mother    Hypertension Mother    Asthma Father    Bronchitis Daughter    Asthma Daughter    Asthma Daughter    Bronchitis Daughter    Asthma Son    Bronchitis Son     Asthma Son    Stroke Paternal Grandmother    Heart attack Paternal Grandmother    Cancer Paternal Grandmother    Cancer Cousin    Asthma Other    Bronchitis Other     Social History   Socioeconomic History   Marital status: Widowed    Spouse name: Not on file   Number of children: Not on file   Years of education: Not on file   Highest education level: Not on file  Occupational History   Not on file  Tobacco Use   Smoking status: Never   Smokeless tobacco: Never  Vaping Use   Vaping Use: Never used  Substance and Sexual Activity   Alcohol use: No   Drug use: No   Sexual activity: Yes    Birth control/protection: Condom  Other Topics Concern   Not on file  Social History Narrative   Not on file   Social Determinants of Health   Financial Resource Strain: Low Risk  (07/27/2020)   Overall Financial Resource Strain (CARDIA)    Difficulty of Paying Living Expenses: Not hard at all  Food Insecurity: No Food Insecurity (07/27/2020)  Hunger Vital Sign    Worried About Running Out of Food in the Last Year: Never true    Ran Out of Food in the Last Year: Never true  Transportation Needs: Unmet Transportation Needs (07/27/2020)   PRAPARE - Transportation    Lack of Transportation (Medical): No    Lack of Transportation (Non-Medical): Yes  Physical Activity: Insufficiently Active (07/27/2020)   Exercise Vital Sign    Days of Exercise per Week: 2 days    Minutes of Exercise per Session: 30 min  Stress: No Stress Concern Present (07/27/2020)   [employer] of Occupational Health - Occupational Stress Questionnaire    Feeling of Stress : Not at all  Social Connections: Moderately Isolated (07/27/2020)   Social Connection and Isolation Panel [NHANES]    Frequency of Communication with Friends and Family: Three times a week    Frequency of Social Gatherings with Friends and Family: Three times a week    Attends Religious Services: 1 to 4 times per year    Active Member of Clubs or  Organizations: No    Attends Banker Meetings: Never    Marital Status: Widowed  Intimate Partner Violence: Not At Risk (07/27/2020)   Humiliation, Afraid, Rape, and Kick questionnaire    Fear of Current or Ex-Partner: No    Emotionally Abused: No    Physically Abused: No    Sexually Abused: No    Outpatient Medications Prior to Visit  Medication Sig Dispense Refill   albuterol (VENTOLIN HFA) 108 (90 Base) MCG/ACT inhaler INHALE 1 OR 2 PUFFS INTO THE LUNGS EVERY 6 HOURS AS NEEDED FOR WHEEZING. 8.5 g 0   buPROPion (WELLBUTRIN XL) 150 MG 24 hr tablet Take 1 tablet (150 mg total) by mouth daily. 30 tablet 0   cyclobenzaprine (FLEXERIL) 5 MG tablet Take 1 tablet (5 mg total) by mouth 3 (three) times daily as needed for muscle spasms. 30 tablet 1   gabapentin (NEURONTIN) 300 MG capsule Take 1 capsule (300 mg total) by mouth 3 (three) times daily as needed. 30 capsule 0   Multiple Vitamin (MULTIVITAMIN) capsule Take 1 capsule by mouth daily.     olmesartan (BENICAR) 20 MG tablet Take 1 tablet (20 mg total) by mouth daily. 30 tablet 1   UNABLE TO FIND RES-Q 105 max 1 daily.     rosuvastatin (CRESTOR) 40 MG tablet Take 1 tablet (40 mg total) by mouth daily. 90 tablet 3   No facility-administered medications prior to visit.    Allergies  Allergen Reactions   Darvocet [Propoxyphene N-Acetaminophen] Itching   Hydrocodone-Acetaminophen Itching   Lactose Intolerance (Gi) Other (See Comments)    Causes stomach cramping.   Other Hives    Patient is allergic to corndogs.   Penicillins Hives and Itching    Has patient had a PCN reaction causing immediate rash, facial/tongue/throat swelling, SOB or lightheadedness with hypotension:Yes Has patient had a PCN reaction causing severe rash involving mucus membranes or skin necrosis:No Has patient had a PCN reaction that required hospitalization:No Has patient had a PCN reaction occurring within the last 10 years:No If all of the above  answers are "NO", then may proceed with Cephalosporin use.    Latex Itching and Rash   Percocet [Oxycodone-Acetaminophen] Itching and Rash    ROS Review of Systems  Constitutional:  Negative for chills and fever.  Eyes:  Negative for visual disturbance.  Respiratory:  Negative for chest tightness and shortness of breath.   Musculoskeletal:  Positive for  arthralgias.  Neurological:  Negative for dizziness and headaches.      Objective:    Physical Exam HENT:     Head: Normocephalic.     Mouth/Throat:     Mouth: Mucous membranes are moist.  Cardiovascular:     Rate and Rhythm: Normal rate.     Heart sounds: Normal heart sounds.  Pulmonary:     Effort: Pulmonary effort is normal.     Breath sounds: Normal breath sounds.  Musculoskeletal:     Left shoulder: Tenderness (mild) present. No deformity. Normal range of motion.     Right knee: Effusion and bony tenderness present. Decreased range of motion. Normal pulse.     Left knee: No effusion or bony tenderness. Normal range of motion. Normal pulse.  Neurological:     Mental Status: She is alert.     BP 128/85   Pulse 83   Ht 5\' 4"  (1.626 m)   Wt 227 lb 0.6 oz (103 kg)   LMP 07/01/2022   SpO2 96%   BMI 38.97 kg/m  Wt Readings from Last 3 Encounters:  08/01/22 227 lb 0.6 oz (103 kg)  07/04/22 230 lb (104.3 kg)  07/02/22 229 lb 12.8 oz (104.2 kg)    Lab Results  Component Value Date   TSH 0.756 07/12/2022   Lab Results  Component Value Date   WBC 8.6 07/12/2022   HGB 12.2 07/12/2022   HCT 36.5 07/12/2022   MCV 91 07/12/2022   PLT 349 07/12/2022   Lab Results  Component Value Date   NA 138 07/12/2022   K 4.0 07/12/2022   CO2 22 07/12/2022   GLUCOSE 90 07/12/2022   BUN 7 07/12/2022   CREATININE 0.82 07/12/2022   BILITOT 0.7 07/12/2022   ALKPHOS 79 07/12/2022   AST 28 07/12/2022   ALT 38 (H) 07/12/2022   PROT 7.3 07/12/2022   ALBUMIN 4.5 07/12/2022   CALCIUM 9.6 07/12/2022   ANIONGAP 6 01/08/2022    EGFR 91 07/12/2022   Lab Results  Component Value Date   CHOL 271 (H) 07/12/2022   Lab Results  Component Value Date   HDL 45 07/12/2022   Lab Results  Component Value Date   LDLCALC 205 (H) 07/12/2022   Lab Results  Component Value Date   TRIG 115 07/12/2022   Lab Results  Component Value Date   CHOLHDL 6.0 (H) 07/12/2022   Lab Results  Component Value Date   HGBA1C 6.1 (H) 07/12/2022      Assessment & Plan:  Acute pain of right knee Assessment & Plan: No injury or trauma reported Reports inability with flexion of the right knee due to increased swelling and pain No signs of infection such as redness, fever, chills, night sweats, fatigue, and rash noted No warmth with palpation Low suspicion for septic arthritis Symptom has been ongoing for 3 to 4 days No medication taken She reports that she used to receive steroid injection 2 years ago in her knees but has not follow-up with orthopedic surgery Pain is 8 out of 10 today Encouraged use of knee brace, ice to decrease swelling heat therapy to decrease pain Will provide a short course of naproxen to decrease inflammation and pain Will get imaging study of the left knee Urgent referral placed to orthopedic surgery   Orders: -     Uric acid -     Rheumatoid factor -     Sedimentation rate -     C-reactive protein -  Ambulatory referral to Orthopedic Surgery -     CBC with Differential/Platelet -     DG Knee 1-2 Views Right -     Naproxen; Take 1 tablet (500 mg total) by mouth 2 (two) times daily with a meal for 14 days.  Dispense: 28 tablet; Refill: 0  Other hyperlipidemia Assessment & Plan: She takes rosuvastatin 40 mg daily Denies muscle aches Requesting refill today Refill sent to the pharmacy  Orders: -     Rosuvastatin Calcium; Take 1 tablet (40 mg total) by mouth daily.  Dispense: 90 tablet; Refill: 3  Primary hypertension Assessment & Plan: Well-controlled Compliant on olmesartan 20 mg  daily Asymptomatic today in the clinic Low-sodium diet with increased physical activity encouraged BP Readings from Last 3 Encounters:  08/01/22 128/85  07/04/22 138/84  07/02/22 (!) 155/90      Chronic left shoulder pain Assessment & Plan: Complains of mild left shoulder pain with range of motion No numbness or tingling in fingers and hands No recent injury or trauma reported She was prescribed gabapentin and Flexeril on 07/04/2022 and reports minimal relief Encouraged to continue treatment regimen Will provide a short course of naproxen to take for 2 weeks She reports no history of GI bleed and is not currently on anticoagulant Kidney function is stable     Follow-up: Return in about 1 week (around 08/08/2022).   Gilmore Laroche, FNP

## 2022-08-01 NOTE — Assessment & Plan Note (Signed)
Well-controlled Compliant on olmesartan 20 mg daily Asymptomatic today in the clinic Low-sodium diet with increased physical activity encouraged BP Readings from Last 3 Encounters:  08/01/22 128/85  07/04/22 138/84  07/02/22 (!) 155/90

## 2022-08-01 NOTE — Assessment & Plan Note (Signed)
Complains of mild left shoulder pain with range of motion No numbness or tingling in fingers and hands No recent injury or trauma reported She was prescribed gabapentin and Flexeril on 07/04/2022 and reports minimal relief Encouraged to continue treatment regimen Will provide a short course of naproxen to take for 2 weeks She reports no history of GI bleed and is not currently on anticoagulant Kidney function is stable

## 2022-08-01 NOTE — Telephone Encounter (Signed)
The patient was seen today with complaints of atraumatic right knee pain with effusion. However, the visit was incomplete as the patient left early due to transportation reasons, specifically to avoid missing her bus, as noted by the CMA. Additionally, an incorrect AVS was printed and provided to the patient, unbeknownst to the provider. Upon returning to the patient's room, it was discovered that she had already left. I informed the CMAs that the visit was incomplete and the incorrect AVS was printed. The correct AVS will be mailed to the patient with instructions to return for labs and to report to [facility] for an imaging study of the right knee.

## 2022-08-01 NOTE — Patient Instructions (Addendum)
I appreciate the opportunity to provide care to you today!    Follow up: 1 week   Labs: please stop by the lab today to get your blood drawn CBC with diff, ESR, CRP, and RF and uric acid   I recommend rest, alternating with heat and cold therapy and use of a knee  brace  Please pick up your prescription at the pharmacy I've started you on naproxen 500 mg to take twice daily for 2 weeks   Apply heat to the affected area such as a moist heat pack or a heating pad. Place a towel between your skin and the heat source. Leave the heat on for 20-30 minutes. Remove the heat if your skin turns bright red. This is especially important if you are unable to feel pain, heat, or cold. You may have a greater risk of getting burned. Apply  ice on the painful area. To do this: If you have a removable splint, remove it as told by your health care provider. Put ice in a plastic bag. Place a towel between your skin and the bag or between your splint and the bag. Leave the ice on for 20 minutes, 2-3 times a day.      Please stop by [facility] to get an x-ray of your right knee  Please   Referrals today- orthopedic surgery   Please continue to a heart-healthy diet and increase your physical activities. Try to exercise for at least five days a week.      It was a pleasure to see you and I look forward to continuing to work together on your health and well-being. Please do not hesitate to call the office if you need care or have questions about your care.   Have a wonderful day and week. With Gratitude, Gilmore Laroche MSN, FNP-BC

## 2022-08-01 NOTE — Assessment & Plan Note (Addendum)
No injury or trauma reported Reports inability with flexion of the right knee due to increased swelling and pain No signs of infection such as redness, fever, chills, night sweats, fatigue, and rash noted No warmth with palpation Low suspicion for septic arthritis Symptom has been ongoing for 3 to 4 days No medication taken She reports that she used to receive steroid injection 2 years ago in her knees but has not follow-up with orthopedic surgery Pain is 8 out of 10 today Encouraged use of knee brace, ice to decrease swelling heat therapy to decrease pain Will provide a short course of naproxen to decrease inflammation and pain Will get imaging study of the left knee Urgent referral placed to orthopedic surgery

## 2022-08-01 NOTE — Assessment & Plan Note (Signed)
She takes rosuvastatin 40 mg daily Denies muscle aches Requesting refill today Refill sent to the pharmacy

## 2022-08-02 ENCOUNTER — Ambulatory Visit (HOSPITAL_COMMUNITY)
Admission: RE | Admit: 2022-08-02 | Discharge: 2022-08-02 | Disposition: A | Discharge status: Home / Self Care | Payer: Medicaid Other | Source: Ambulatory Visit | Attending: Family Medicine | Admitting: Family Medicine

## 2022-08-02 DIAGNOSIS — M25561 Pain in right knee: Secondary | ICD-10-CM | POA: Insufficient documentation

## 2022-08-02 DIAGNOSIS — M25461 Effusion, right knee: Secondary | ICD-10-CM | POA: Diagnosis not present

## 2022-08-02 NOTE — Telephone Encounter (Signed)
AVS was mailed to pt, I have attempted to contact pt no vm set up.

## 2022-08-02 NOTE — Telephone Encounter (Signed)
Spoke with patient.

## 2022-08-02 NOTE — Telephone Encounter (Signed)
Returning a call

## 2022-08-06 ENCOUNTER — Other Ambulatory Visit: Payer: Self-pay

## 2022-08-06 ENCOUNTER — Telehealth: Payer: Self-pay | Admitting: Family Medicine

## 2022-08-06 DIAGNOSIS — F339 Major depressive disorder, recurrent, unspecified: Secondary | ICD-10-CM

## 2022-08-06 MED ORDER — BUPROPION HCL ER (XL) 150 MG PO TB24
150.0000 mg | ORAL_TABLET | Freq: Every day | ORAL | 0 refills | Status: DC
Start: 2022-08-06 — End: 2022-09-12

## 2022-08-06 MED ORDER — GABAPENTIN 300 MG PO CAPS: 300.0000 mg | ORAL_CAPSULE | Freq: Three times a day (TID) | ORAL | 0 refills | Status: DC | PRN

## 2022-08-06 NOTE — Telephone Encounter (Signed)
Prescription Request  08/06/2022  LOV: 08/01/2022  What is the name of the medication or equipment? gabapentin (NEURONTIN) 300 MG capsule   buPROPion (WELLBUTRIN XL) 150 MG 24 hr tablet   rosuvastatin (CRESTOR) 40 MG tablet   Have you contacted your pharmacy to request a refill? No   Which pharmacy would you like this sent to?   [facility] - [place], [place] - [address] [place] [address] Phone: [phone] Fax: [phone]    Patient notified that their request is being sent to the clinical staff for review and that they should receive a response within 2 business days.   Please advise at Mobile [phone] (mobile)     Pt is requesting leg brace that comes over knee   Needs X ray results.  Wants a call back.

## 2022-08-07 ENCOUNTER — Ambulatory Visit: Payer: Medicaid Other | Admitting: Women's Health

## 2022-08-08 NOTE — Progress Notes (Signed)
Please inform the patient x-ray of her right knee showed excess fluid in the right knee with no evidence of dislocation or fracture.  I recommend rest, ice, compression, and elevation of the affected knee.  I recommend follow up with orthopedics for drainage of the fluids and to continue taking prescribed NSAIDs.

## 2022-08-17 ENCOUNTER — Ambulatory Visit (INDEPENDENT_AMBULATORY_CARE_PROVIDER_SITE_OTHER): Payer: Medicaid Other | Admitting: Orthopedic Surgery

## 2022-08-17 ENCOUNTER — Encounter: Payer: Self-pay | Admitting: Orthopedic Surgery

## 2022-08-17 VITALS — BP 137/63 | HR 89 | Ht 64.0 in | Wt 227.0 lb

## 2022-08-17 DIAGNOSIS — M25561 Pain in right knee: Secondary | ICD-10-CM

## 2022-08-17 NOTE — Progress Notes (Signed)
New Patient Visit  Assessment: Breanna Malone is a 42 y.o. adult with the following: 1. Acute pain of right knee  Plan: Breanna Malone has pain in her right knee.  No specific injury.  Tenderness to palpation is diffuse, including within the musculature of the quadriceps.  Mild tenderness to palpation posterior knee as well.  Otherwise, her knee is stable on physical exam.  Minimal swelling is appreciated.  No bruising.  She notes that the swelling has improved.  Recommend a brace for her right knee, continue to monitor.  Medications as needed.  Ice the knee as needed.  Continue to work on range of motion.  Follow-up: Return if symptoms worsen or fail to improve.  Subjective:  Chief Complaint  Patient presents with   Knee Pain    R kne swelling and pain for 1 wk. No injury that she can think of, pt states she does do a lot of walking.     History of Present Illness: Breanna Malone is a 42 y.o. adult who has been referred by Gilmore Laroche, FNP for evaluation of right knee pain.  She has had pain in the right knee for a little over a week.  No specific injury.  She notes that the swelling in the knee has improved.  She was evaluated by her PCP last week, and radiographs demonstrated some mild degenerative changes, with concern for an effusion.  She has been wearing a knee immobilizer.  She has been taking medication sparingly.  She does a lot of walking.  She did not twist her knee.  No buckling sensations.   Review of Systems: No fevers or chills No numbness or tingling No chest pain No shortness of breath No bowel or bladder dysfunction No GI distress No headaches   Medical History:  Past Medical History:  Diagnosis Date   Abnormal Pap smear    Arthritis    Asthma    Asthma    Bipolar 1 disorder (HCC)    Bone spur    rib   Bronchitis    BV (bacterial vaginosis) 10/08/2019   8/19 rx flagyl   Carpal tunnel syndrome    Cervical spine pain    Chest pain    Diabetes  mellitus without complication (HCC)    Elevated cholesterol    High cholesterol    Hyperlipidemia    Hyperlipoproteinemia    Hypertension    Post traumatic stress disorder    Right ovarian cyst 10/31/2012   Complex right ovarian cyst seen 8/18 in ER need f/u US   Tendonitis    Vaginal Pap smear, abnormal     Past Surgical History:  Procedure Laterality Date   CARPAL TUNNEL RELEASE Right 06/28/2016   Procedure: RIGHT CARPAL TUNNEL RELEASE;  Surgeon: Vickki Hearing, MD;  Location: [facility];  Service: Orthopedics;  Laterality: Right;   HERNIA REPAIR     umbilical   RIB RESECTION Left 03/16/2021   Procedure: RESECTION OF TUMOR LEFT NINTH RIB;  Surgeon: Loreli Slot, MD;  Location: [facility] OR;  Service: Thoracic;  Laterality: Left;    Family History  Problem Relation Age of Onset   Diabetes Mother    Hypertension Mother    Asthma Father    Bronchitis Daughter    Asthma Daughter    Asthma Daughter    Bronchitis Daughter    Asthma Son    Bronchitis Son    Asthma Son    Stroke Paternal Grandmother  Heart attack Paternal Grandmother    Cancer Paternal Grandmother    Cancer Cousin    Asthma Other    Bronchitis Other    Social History   Tobacco Use   Smoking status: Never   Smokeless tobacco: Never  Vaping Use   Vaping Use: Never used  Substance Use Topics   Alcohol use: No   Drug use: No    Allergies  Allergen Reactions   Darvocet [Propoxyphene N-Acetaminophen] Itching   Hydrocodone-Acetaminophen Itching   Lactose Intolerance (Gi) Other (See Comments)    Causes stomach cramping.   Other Hives    Patient is allergic to corndogs.   Penicillins Hives and Itching    Has patient had a PCN reaction causing immediate rash, facial/tongue/throat swelling, SOB or lightheadedness with hypotension:Yes Has patient had a PCN reaction causing severe rash involving mucus membranes or skin necrosis:No Has patient had a PCN reaction that required hospitalization:No Has  patient had a PCN reaction occurring within the last 10 years:No If all of the above answers are "NO", then may proceed with Cephalosporin use.    Latex Itching and Rash   Percocet [Oxycodone-Acetaminophen] Itching and Rash    No outpatient medications have been marked as taking for the 08/17/22 encounter (Office Visit) with Oliver Barre, MD.    Objective: BP 137/63   Pulse 89   Ht 5\' 4"  (1.626 m)   Wt 227 lb (103 kg)   BMI 38.96 kg/m   Physical Exam:  General: Alert and oriented. and No acute distress. Gait: Right sided antalgic gait.  Evaluation of the right knee demonstrates minimal swelling.  She is able to achieve full extension.  She tolerates flexion beyond 110 degrees.  Some tenderness to palpation within the VMO.  No bruising.  No redness.  Tenderness to palpation over the medial joint line.  No increased laxity to varus or valgus stress.  Negative Lachman.  Possible small Baker's cyst, with some tenderness in the popliteal space.  IMAGING: I personally reviewed images previously obtained in clinic   Radiographs of the right knee were previously obtained.  Limited AP and lateral views.  Mild degenerative changes noted overall.   New Medications:  No orders of the defined types were placed in this encounter.     Oliver Barre, MD  08/17/2022 12:40 PM

## 2022-08-17 NOTE — Patient Instructions (Signed)

## 2022-08-30 ENCOUNTER — Ambulatory Visit (INDEPENDENT_AMBULATORY_CARE_PROVIDER_SITE_OTHER): Payer: Medicaid Other | Admitting: Professional Counselor

## 2022-08-30 DIAGNOSIS — F339 Major depressive disorder, recurrent, unspecified: Secondary | ICD-10-CM
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
## 2022-08-30 NOTE — Patient Instructions (Signed)
If your symptoms worsen or you have thoughts of suicide/homicide, PLEASE SEEK IMMEDIATE MEDICAL ATTENTION.  You may always call:   National Suicide Hotline: 988 or 800-273-8255 [place] Crisis Line: 336-832-9700 Crisis Recovery in [place]: 800-939-5911      These are available 24 hours a day, 7 days a week.  

## 2022-09-07 ENCOUNTER — Other Ambulatory Visit: Payer: Self-pay | Admitting: Family Medicine

## 2022-09-07 ENCOUNTER — Telehealth (INDEPENDENT_AMBULATORY_CARE_PROVIDER_SITE_OTHER): Payer: Medicaid Other | Admitting: Professional Counselor

## 2022-09-07 DIAGNOSIS — F331 Major depressive disorder, recurrent, moderate: Secondary | ICD-10-CM
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
## 2022-09-12 ENCOUNTER — Other Ambulatory Visit: Payer: Self-pay

## 2022-09-12 ENCOUNTER — Telehealth: Payer: Self-pay | Admitting: Family Medicine

## 2022-09-12 DIAGNOSIS — F339 Major depressive disorder, recurrent, unspecified: Secondary | ICD-10-CM

## 2022-09-12 DIAGNOSIS — I10 Essential (primary) hypertension: Secondary | ICD-10-CM

## 2022-09-12 MED ORDER — BUPROPION HCL ER (XL) 150 MG PO TB24
150.0000 mg | ORAL_TABLET | Freq: Every day | ORAL | 0 refills | Status: DC
Start: 2022-09-12 — End: 2022-10-31

## 2022-09-12 MED ORDER — OLMESARTAN MEDOXOMIL 20 MG PO TABS
20.0000 mg | ORAL_TABLET | Freq: Every day | ORAL | 0 refills | Status: DC
Start: 2022-09-12 — End: 2022-10-31

## 2022-09-12 NOTE — Telephone Encounter (Signed)
Prescription Request  09/12/2022  LOV: 08/01/2022  What is the name of the medication or equipment? buPROPion (WELLBUTRIN XL) 150 MG 24 hr tablet   olmesartan (BENICAR) 20 MG tablet     Have you contacted your pharmacy to request a refill? No   Which pharmacy would you like this sent to?  [facility] - [place], [place] - [address] [place] [address] Phone: [phone] Fax: [phone]    Patient notified that their request is being sent to the clinical staff for review and that they should receive a response within 2 business days.   Please advise at Mobile [phone] (mobile)

## 2022-09-12 NOTE — Telephone Encounter (Signed)
Refill sent.

## 2022-09-13 ENCOUNTER — Ambulatory Visit (INDEPENDENT_AMBULATORY_CARE_PROVIDER_SITE_OTHER): Payer: Medicaid Other | Admitting: Professional Counselor

## 2022-09-13 DIAGNOSIS — F4310 Post-traumatic stress disorder, unspecified: Secondary | ICD-10-CM

## 2022-09-13 DIAGNOSIS — F331 Major depressive disorder, recurrent, moderate: Secondary | ICD-10-CM | POA: Diagnosis not present

## 2022-09-13 DIAGNOSIS — M25561 Pain in right knee: Secondary | ICD-10-CM | POA: Diagnosis not present

## 2022-09-13 NOTE — Patient Instructions (Signed)
If your symptoms worsen or you have thoughts of suicide/homicide, PLEASE SEEK IMMEDIATE MEDICAL ATTENTION.  You may always call:   National Suicide Hotline: 988 or 800-273-8255 [facility] Crisis Line: 336-832-9700 Crisis Recovery in [place]: 800-939-5911      These are available 24 hours a day, 7 days a week.  
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
## 2022-09-14 LAB — CBC WITH DIFFERENTIAL/PLATELET: MCHC: 33 g/dL (ref 31.5–35.7)

## 2022-09-17 NOTE — Progress Notes (Signed)
Kindly inform the patient that her labs are stable

## 2022-09-24 ENCOUNTER — Ambulatory Visit: Payer: 59

## 2022-09-24 ENCOUNTER — Other Ambulatory Visit: Payer: Self-pay | Admitting: Adult Health

## 2022-09-25 ENCOUNTER — Telehealth: Payer: Self-pay | Admitting: Family Medicine

## 2022-09-25 NOTE — Telephone Encounter (Signed)
Spoke to pt went over labs 

## 2022-09-25 NOTE — Telephone Encounter (Signed)
Asked for an appt to go over hand tingling and numbness.

## 2022-09-25 NOTE — Telephone Encounter (Signed)
Patient came by office to check on lab work she has a new number and it has been updated in epic

## 2022-10-02 ENCOUNTER — Encounter: Payer: Self-pay | Admitting: Family Medicine

## 2022-10-02 ENCOUNTER — Ambulatory Visit (INDEPENDENT_AMBULATORY_CARE_PROVIDER_SITE_OTHER): Payer: Medicaid Other | Admitting: Family Medicine

## 2022-10-02 DIAGNOSIS — G5601 Carpal tunnel syndrome, right upper limb: Secondary | ICD-10-CM

## 2022-10-02 DIAGNOSIS — E038 Other specified hypothyroidism: Secondary | ICD-10-CM | POA: Diagnosis not present

## 2022-10-02 DIAGNOSIS — E559 Vitamin D deficiency, unspecified: Secondary | ICD-10-CM | POA: Diagnosis not present

## 2022-10-02 DIAGNOSIS — R7301 Impaired fasting glucose: Secondary | ICD-10-CM

## 2022-10-02 DIAGNOSIS — E7849 Other hyperlipidemia: Secondary | ICD-10-CM | POA: Diagnosis not present

## 2022-10-02 NOTE — Patient Instructions (Addendum)
I appreciate the opportunity to provide care to you today!    Follow up:  10/31/2022  Labs: please stop by the lab 2-3 days before your next appointment to get your blood drawn (CBC, CMP, TSH, Lipid profile, HgA1c, Vit D)  Carpal tunnel -please follow up with orthopedics -Non-pharmacological interventions for carpal tunnel syndrome (CTS)   Wrist Splinting: Night Splints: Wearing a wrist splint at night keeps the wrist in a neutral position, reducing pressure on the median nerve and alleviating symptoms during sleep. Day Splints: For some, using a wrist splint during the day, especially during activities that exacerbate symptoms, can be beneficial. -Avoiding Repetitive Movements: Limiting activities that involve repetitive wrist flexion or gripping can help reduce symptoms.  Cold and Heat Therapy: Cold Therapy: Applying ice packs can help reduce inflammation and numb pain in the wrist. Heat Therapy: Applying heat may help relax and loosen tissues, improving blood flow to the affected area. Lifestyle Modifications: Weight Management: Maintaining a healthy weight can reduce the risk of developing CTS or worsening existing symptoms. Regular Breaks: Taking frequent breaks during repetitive activities can reduce strain on the wrist.    Weight loss tips:   Eat three meals per day at times discussed. Cut out all diet bevergages and drink only water Eat whole food plant based meals Cut out junk food, fast food and processed foods Exercise 150 minutes a week Lose 1-2 lbs per week. Keep a food journal Choose foods that grow in a garden or in a fruit orchard and protein of animals with fins or feathers.  Lifestyle Medicine - Whole Food, Plant Predominant Nutrition is highly recommended: Eat Plenty of vegetables, Mushrooms, fruits, Legumes, Whole Grains, Nuts, seeds in lieu of processed meats, processed snacks/pastries red meat, poultry, eggs.  -It is better to avoid simple carbohydrates  including: Cakes, Sweet Desserts, Ice Cream, Soda (diet and regular), Sweet Tea, Candies, Chips, Cookies, Store Bought Juices, Alcohol in Excess of  1-2 drinks a day, Lemonade,  Artificial Sweeteners, Doughnuts, Coffee Creamers, "Sugar-free" Products, etc, etc.  This is not a complete list..... Exercise: If you are able: 30 -60 minutes a day ,4 days a week, or 150 minutes a week.  The longer the better.  Combine stretch, strength, and aerobic activities.  If you were told in the past that you have high risk for cardiovascular diseases, you may seek evaluation by your heart doctor prior to initiating moderate to intense exercise programs.   Please continue to a heart-healthy diet and increase your physical activities. Try to exercise for at least five days a week.    It was a pleasure to see you and I look forward to continuing to work together on your health and well-being. Please do not hesitate to call the office if you need care or have questions about your care.  In case of emergency, please visit the Emergency Department for urgent care, or contact our clinic at [phone] to schedule an appointment. We're here to help you!   Have a wonderful day and week. With Gratitude, Gilmore Laroche MSN, FNP-BC

## 2022-10-02 NOTE — Progress Notes (Signed)
Established Patient Office Visit  Subjective:  Patient ID: Breanna Malone, adult    DOB: May 01, 1980  Age: 42 y.o. MRN: [id number]  CC:  Chief Complaint  Patient presents with   Hand Problem    Pt reports hand tingling and pain   Obesity    Pt has concerns about her weight feels like bupropion is making her increase on weight.     HPI Breanna Malone is a 42 y.o. adult with past medical history of carpal tunnel syndrome of right wrist, hypertension, obesity and asthma presents for f/u of  chronic medical conditions. For the details of today's visit, please refer to the assessment and plan.        Past Medical History:  Diagnosis Date   Abnormal Pap smear    Arthritis    Asthma    Asthma    Bipolar 1 disorder (HCC)    Bone spur    rib   Bronchitis    BV (bacterial vaginosis) 10/08/2019   8/19 rx flagyl   Carpal tunnel syndrome    Cervical spine pain    Chest pain    Diabetes mellitus without complication (HCC)    Elevated cholesterol    High cholesterol    Hyperlipidemia    Hyperlipoproteinemia    Hypertension    Post traumatic stress disorder    Right ovarian cyst 10/31/2012   Complex right ovarian cyst seen 8/18 in ER need f/u US   Tendonitis    Vaginal Pap smear, abnormal     Past Surgical History:  Procedure Laterality Date   CARPAL TUNNEL RELEASE Right 06/28/2016   Procedure: RIGHT CARPAL TUNNEL RELEASE;  Surgeon: Vickki Hearing, MD;  Location: [facility];  Service: Orthopedics;  Laterality: Right;   HERNIA REPAIR     umbilical   RIB RESECTION Left 03/16/2021   Procedure: RESECTION OF TUMOR LEFT NINTH RIB;  Surgeon: Loreli Slot, MD;  Location: [facility];  Service: Thoracic;  Laterality: Left;    Family History  Problem Relation Age of Onset   Diabetes Mother    Hypertension Mother    Asthma Father    Bronchitis Daughter    Asthma Daughter    Asthma Daughter    Bronchitis Daughter    Asthma Son    Bronchitis Son    Asthma Son    Stroke  Paternal Grandmother    Heart attack Paternal Grandmother    Cancer Paternal Grandmother    Cancer Cousin    Asthma Other    Bronchitis Other     Social History   Socioeconomic History   Marital status: Widowed    Spouse name: Not on file   Number of children: Not on file   Years of education: Not on file   Highest education level: Not on file  Occupational History   Not on file  Tobacco Use   Smoking status: Never   Smokeless tobacco: Never  Vaping Use   Vaping status: Never Used  Substance and Sexual Activity   Alcohol use: No   Drug use: No   Sexual activity: Yes    Birth control/protection: Condom  Other Topics Concern   Not on file  Social History Narrative   Not on file   Social Determinants of Health   Financial Resource Strain: Low Risk  (07/27/2020)   Overall Financial Resource Strain (CARDIA)    Difficulty of Paying Living Expenses: Not hard at all  Food Insecurity: No Food Insecurity (07/27/2020)  Hunger Vital Sign    Worried About Running Out of Food in the Last Year: Never true    Ran Out of Food in the Last Year: Never true  Transportation Needs: Unmet Transportation Needs (07/27/2020)   PRAPARE - Transportation    Lack of Transportation (Medical): No    Lack of Transportation (Non-Medical): Yes  Physical Activity: Insufficiently Active (07/27/2020)   Exercise Vital Sign    Days of Exercise per Week: 2 days    Minutes of Exercise per Session: 30 min  Stress: No Stress Concern Present (07/27/2020)   [employer] of Occupational Health - Occupational Stress Questionnaire    Feeling of Stress : Not at all  Social Connections: Moderately Isolated (07/27/2020)   Social Connection and Isolation Panel [NHANES]    Frequency of Communication with Friends and Family: Three times a week    Frequency of Social Gatherings with Friends and Family: Three times a week    Attends Religious Services: 1 to 4 times per year    Active Member of Clubs or Organizations: No     Attends Banker Meetings: Never    Marital Status: Widowed  Intimate Partner Violence: Not At Risk (07/27/2020)   Humiliation, Afraid, Rape, and Kick questionnaire    Fear of Current or Ex-Partner: No    Emotionally Abused: No    Physically Abused: No    Sexually Abused: No    Outpatient Medications Prior to Visit  Medication Sig Dispense Refill   albuterol (VENTOLIN HFA) 108 (90 Base) MCG/ACT inhaler INHALE 1 OR 2 PUFFS INTO THE LUNGS EVERY 6 HOURS AS NEEDED FOR WHEEZING. 8.5 g 0   buPROPion (WELLBUTRIN XL) 150 MG 24 hr tablet Take 1 tablet (150 mg total) by mouth daily. 30 tablet 0   cyclobenzaprine (FLEXERIL) 5 MG tablet Take 1 tablet (5 mg total) by mouth 3 (three) times daily as needed for muscle spasms. 30 tablet 1   gabapentin (NEURONTIN) 300 MG capsule Take 1 capsule (300 mg total) by mouth 3 (three) times daily as needed. 30 capsule 0   medroxyPROGESTERone Acetate 150 MG/ML SUSY INJECT 1 ML (150MG ) INTRAMUSCULARLY EVERY 3 MONTHS. 1 mL 0   Multiple Vitamin (MULTIVITAMIN) capsule Take 1 capsule by mouth daily.     olmesartan (BENICAR) 20 MG tablet Take 1 tablet (20 mg total) by mouth daily. 30 tablet 0   rosuvastatin (CRESTOR) 40 MG tablet Take 1 tablet (40 mg total) by mouth daily. 90 tablet 3   UNABLE TO FIND RES-Q 105 max 1 daily.     No facility-administered medications prior to visit.    Allergies  Allergen Reactions   Darvocet [Propoxyphene N-Acetaminophen] Itching   Hydrocodone-Acetaminophen Itching   Lactose Intolerance (Gi) Other (See Comments)    Causes stomach cramping.   Other Hives    Patient is allergic to corndogs.   Penicillins Hives and Itching    Has patient had a PCN reaction causing immediate rash, facial/tongue/throat swelling, SOB or lightheadedness with hypotension:Yes Has patient had a PCN reaction causing severe rash involving mucus membranes or skin necrosis:No Has patient had a PCN reaction that required hospitalization:No Has  patient had a PCN reaction occurring within the last 10 years:No If all of the above answers are "NO", then may proceed with Cephalosporin use.    Latex Itching and Rash   Percocet [Oxycodone-Acetaminophen] Itching and Rash    ROS Review of Systems  Constitutional:  Negative for chills and fever.  Eyes:  Negative for visual  disturbance.  Respiratory:  Negative for chest tightness and shortness of breath.   Neurological:  Positive for numbness. Negative for dizziness and headaches.      Objective:    Physical Exam Constitutional:      Appearance: She is obese.  HENT:     Head: Normocephalic.     Mouth/Throat:     Mouth: Mucous membranes are moist.  Cardiovascular:     Rate and Rhythm: Normal rate.     Heart sounds: Normal heart sounds.  Pulmonary:     Effort: Pulmonary effort is normal.     Breath sounds: Normal breath sounds.  Neurological:     Mental Status: She is alert.     BP 123/84   Pulse 79   Ht 5\' 4"  (1.626 m)   Wt 234 lb 0.6 oz (106.2 kg)   SpO2 98%   BMI 40.17 kg/m  Wt Readings from Last 3 Encounters:  10/02/22 234 lb 0.6 oz (106.2 kg)  08/17/22 227 lb (103 kg)  08/01/22 227 lb 0.6 oz (103 kg)    Lab Results  Component Value Date   TSH 0.756 07/12/2022   Lab Results  Component Value Date   WBC 8.4 09/13/2022   HGB 11.9 09/13/2022   HCT 36.1 09/13/2022   MCV 92 09/13/2022   PLT 304 09/13/2022   Lab Results  Component Value Date   NA 138 07/12/2022   K 4.0 07/12/2022   CO2 22 07/12/2022   GLUCOSE 90 07/12/2022   BUN 7 07/12/2022   CREATININE 0.82 07/12/2022   BILITOT 0.7 07/12/2022   ALKPHOS 79 07/12/2022   AST 28 07/12/2022   ALT 38 (H) 07/12/2022   PROT 7.3 07/12/2022   ALBUMIN 4.5 07/12/2022   CALCIUM 9.6 07/12/2022   ANIONGAP 6 01/08/2022   EGFR 91 07/12/2022   Lab Results  Component Value Date   CHOL 271 (H) 07/12/2022   Lab Results  Component Value Date   HDL 45 07/12/2022   Lab Results  Component Value Date    LDLCALC 205 (H) 07/12/2022   Lab Results  Component Value Date   TRIG 115 07/12/2022   Lab Results  Component Value Date   CHOLHDL 6.0 (H) 07/12/2022   Lab Results  Component Value Date   HGBA1C 6.1 (H) 07/12/2022      Assessment & Plan:  Morbid obesity (HCC) Assessment & Plan: Educated that bupropion does not cause weight gain Reviewed tips to assist with weight loss: -Reduce Portion Sizes: Be mindful of portion sizes to manage calorie intake effectively. I-ncrease Intake of Nutrient-Rich Foods: Incorporate more fruits, vegetables, and whole grains into your diet. -Focus on Lean Proteins: Opt for lean proteins such as chicken, fish, beans, and legumes. -Choose Low-Fat Dairy Products: Select dairy products that are lower in fat. -Limit Unhealthy Fats: Reduce consumption of saturated fats, trans fatty acids, and cholesterol. -Be Physically Active: Aim for at least 30 minutes of moderate activity, such as brisk walking, on at least 5 days a week.    Carpal tunnel syndrome of right wrist Assessment & Plan: Encouraged to follow up with orthopedics Reviewed non-pharmacological interventions for carpal tunnel syndrome (CTS)   Wrist Splinting: Night Splints: Wearing a wrist splint at night keeps the wrist in a neutral position, reducing pressure on the median nerve and alleviating symptoms during sleep. Day Splints: For some, using a wrist splint during the day, especially during activities that exacerbate symptoms, can be beneficial. -Avoiding Repetitive Movements: Limiting activities that involve  repetitive wrist flexion or gripping can help reduce symptoms.  Cold and Heat Therapy: Cold Therapy: Applying ice packs can help reduce inflammation and numb pain in the wrist. Heat Therapy: Applying heat may help relax and loosen tissues, improving blood flow to the affected area. Lifestyle Modifications: Weight Management: Maintaining a healthy weight can reduce the risk of developing  CTS or worsening existing symptoms. Regular Breaks: Taking frequent breaks during repetitive activities can reduce strain on the wrist.    IFG (impaired fasting glucose) -     Hemoglobin A1c  Vitamin D deficiency -     VITAMIN D 25 Hydroxy (Vit-D Deficiency, Fractures)  Other specified hypothyroidism -     TSH + free T4  Other hyperlipidemia -     Lipid panel -     CMP14+EGFR -     CBC with Differential/Platelet  Note: This chart has been completed using Engineer, civil (consulting) software, and while attempts have been made to ensure accuracy, certain words and phrases may not be transcribed as intended.    Follow-up: No follow-ups on file.   Gilmore Laroche, FNP

## 2022-10-05 NOTE — Assessment & Plan Note (Signed)
Encouraged to follow up with orthopedics Reviewed non-pharmacological interventions for carpal tunnel syndrome (CTS)   Wrist Splinting: Night Splints: Wearing a wrist splint at night keeps the wrist in a neutral position, reducing pressure on the median nerve and alleviating symptoms during sleep. Day Splints: For some, using a wrist splint during the day, especially during activities that exacerbate symptoms, can be beneficial. -Avoiding Repetitive Movements: Limiting activities that involve repetitive wrist flexion or gripping can help reduce symptoms.  Cold and Heat Therapy: Cold Therapy: Applying ice packs can help reduce inflammation and numb pain in the wrist. Heat Therapy: Applying heat may help relax and loosen tissues, improving blood flow to the affected area. Lifestyle Modifications: Weight Management: Maintaining a healthy weight can reduce the risk of developing CTS or worsening existing symptoms. Regular Breaks: Taking frequent breaks during repetitive activities can reduce strain on the wrist.

## 2022-10-05 NOTE — Assessment & Plan Note (Addendum)
Educated that bupropion does not cause weight gain Reviewed tips to assist with weight loss: -Reduce Portion Sizes: Be mindful of portion sizes to manage calorie intake effectively. I-ncrease Intake of Nutrient-Rich Foods: Incorporate more fruits, vegetables, and whole grains into your diet. -Focus on Lean Proteins: Opt for lean proteins such as chicken, fish, beans, and legumes. -Choose Low-Fat Dairy Products: Select dairy products that are lower in fat. -Limit Unhealthy Fats: Reduce consumption of saturated fats, trans fatty acids, and cholesterol. -Be Physically Active: Aim for at least 30 minutes of moderate activity, such as brisk walking, on at least 5 days a week.

## 2022-10-09 ENCOUNTER — Ambulatory Visit: Payer: Medicaid Other | Admitting: Family Medicine

## 2022-10-10 ENCOUNTER — Encounter: Payer: Self-pay | Admitting: Family Medicine

## 2022-10-31 ENCOUNTER — Ambulatory Visit: Payer: Medicaid Other | Admitting: Family Medicine

## 2022-10-31 ENCOUNTER — Telehealth: Payer: Self-pay | Admitting: Family Medicine

## 2022-10-31 ENCOUNTER — Other Ambulatory Visit: Payer: Self-pay

## 2022-10-31 DIAGNOSIS — J4520 Mild intermittent asthma, uncomplicated: Secondary | ICD-10-CM

## 2022-10-31 DIAGNOSIS — F339 Major depressive disorder, recurrent, unspecified: Secondary | ICD-10-CM

## 2022-10-31 DIAGNOSIS — I10 Essential (primary) hypertension: Secondary | ICD-10-CM

## 2022-10-31 DIAGNOSIS — E7849 Other hyperlipidemia: Secondary | ICD-10-CM

## 2022-10-31 MED ORDER — ROSUVASTATIN CALCIUM 40 MG PO TABS: 40.0000 mg | ORAL_TABLET | Freq: Every day | ORAL | 3 refills | Status: DC

## 2022-10-31 MED ORDER — GABAPENTIN 300 MG PO CAPS: 300.0000 mg | ORAL_CAPSULE | Freq: Three times a day (TID) | ORAL | 0 refills | Status: DC | PRN

## 2022-10-31 MED ORDER — OLMESARTAN MEDOXOMIL 20 MG PO TABS: 20.0000 mg | ORAL_TABLET | Freq: Every day | ORAL | 0 refills | Status: DC

## 2022-10-31 MED ORDER — BUPROPION HCL ER (XL) 150 MG PO TB24
150.0000 mg | ORAL_TABLET | Freq: Every day | ORAL | 0 refills | Status: DC
Start: 2022-10-31 — End: 2022-12-10

## 2022-10-31 MED ORDER — ALBUTEROL SULFATE HFA 108 (90 BASE) MCG/ACT IN AERS: INHALATION_SPRAY | RESPIRATORY_TRACT | 0 refills | Status: DC

## 2022-10-31 MED ORDER — CYCLOBENZAPRINE HCL 5 MG PO TABS: 5.0000 mg | ORAL_TABLET | Freq: Three times a day (TID) | ORAL | 1 refills | Status: DC | PRN

## 2022-10-31 NOTE — Telephone Encounter (Signed)
Refills sent

## 2022-10-31 NOTE — Telephone Encounter (Signed)
Prescription Request  10/31/2022  LOV: 10/02/2022  What is the name of the medication or equipment? ALL ACTIVE MEDICATIONS  Have you contacted your pharmacy to request a refill? No   Which pharmacy would you like this sent to?  [facility] - [place], [place] - [address] [place] [address] Phone: [phone] Fax: [phone]    Patient notified that their request is being sent to the clinical staff for review and that they should receive a response within 2 business days.   Please advise at Mobile [phone] (mobile)      ALSO WANTS TO SEE IF CAN GET ORDERS FOR XRAY, WANTS TOP MAKE SURE TUMOR IS NOT COMING BACK. WANTS A CALL BACK.

## 2022-11-01 ENCOUNTER — Telehealth: Payer: Self-pay | Admitting: Family Medicine

## 2022-11-01 NOTE — Telephone Encounter (Signed)
FL2  Noted  Copied Sleeved  Original in PCP box Copy front desk folder

## 2022-11-05 ENCOUNTER — Encounter (HOSPITAL_COMMUNITY): Payer: Self-pay | Admitting: Psychiatry

## 2022-11-05 ENCOUNTER — Encounter (HOSPITAL_COMMUNITY): Payer: Self-pay

## 2022-11-13 ENCOUNTER — Ambulatory Visit (HOSPITAL_COMMUNITY): Payer: Medicaid Other | Admitting: Psychiatry

## 2022-11-15 ENCOUNTER — Ambulatory Visit: Payer: Medicaid Other | Admitting: Family Medicine

## 2022-11-17 ENCOUNTER — Other Ambulatory Visit: Payer: Self-pay

## 2022-11-17 ENCOUNTER — Emergency Department (HOSPITAL_COMMUNITY)
Admission: EM | Admit: 2022-11-17 | Discharge: 2022-11-18 | Disposition: A | Discharge status: Home / Self Care | Payer: No Typology Code available for payment source | Attending: Emergency Medicine | Admitting: Emergency Medicine

## 2022-11-17 ENCOUNTER — Encounter (HOSPITAL_COMMUNITY): Payer: Self-pay

## 2022-11-17 DIAGNOSIS — R109 Unspecified abdominal pain: Secondary | ICD-10-CM | POA: Insufficient documentation

## 2022-11-17 DIAGNOSIS — Z91040 Latex allergy status: Secondary | ICD-10-CM | POA: Insufficient documentation

## 2022-11-17 DIAGNOSIS — M549 Dorsalgia, unspecified: Secondary | ICD-10-CM | POA: Insufficient documentation

## 2022-11-17 DIAGNOSIS — R1032 Left lower quadrant pain: Secondary | ICD-10-CM | POA: Diagnosis not present

## 2022-11-17 NOTE — ED Triage Notes (Signed)
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 Pt says she takes gabapentin for nerve pain.

## 2022-11-18 ENCOUNTER — Emergency Department (HOSPITAL_COMMUNITY): Payer: No Typology Code available for payment source

## 2022-11-18 DIAGNOSIS — R109 Unspecified abdominal pain: Secondary | ICD-10-CM | POA: Diagnosis not present

## 2022-11-18 LAB — URINALYSIS, ROUTINE W REFLEX MICROSCOPIC
Bacteria, UA: NONE SEEN
Bilirubin Urine: NEGATIVE
Glucose, UA: NEGATIVE mg/dL
Hgb urine dipstick: NEGATIVE
Ketones, ur: NEGATIVE mg/dL
Leukocytes,Ua: NEGATIVE
Nitrite: NEGATIVE
Protein, ur: 30 mg/dL — AB
Specific Gravity, Urine: 1.024 (ref 1.005–1.030)
pH: 6 (ref 5.0–8.0)

## 2022-11-18 LAB — CBC WITH DIFFERENTIAL/PLATELET
Abs Immature Granulocytes: 0.01 10*3/uL (ref 0.00–0.07)
Basophils Absolute: 0 10*3/uL (ref 0.0–0.1)
Basophils Relative: 1 %
Eosinophils Absolute: 0.1 10*3/uL (ref 0.0–0.5)
Eosinophils Relative: 2 %
HCT: 36.6 % (ref 36.0–46.0)
Hemoglobin: 11.7 g/dL — ABNORMAL LOW (ref 12.0–15.0)
Immature Granulocytes: 0 %
Lymphocytes Relative: 42 %
Lymphs Abs: 3.7 10*3/uL (ref 0.7–4.0)
MCH: 31 pg (ref 26.0–34.0)
MCHC: 32 g/dL (ref 30.0–36.0)
MCV: 97.1 fL (ref 80.0–100.0)
Monocytes Absolute: 0.6 10*3/uL (ref 0.1–1.0)
Monocytes Relative: 7 %
Neutro Abs: 4.3 10*3/uL (ref 1.7–7.7)
Neutrophils Relative %: 48 %
Platelets: 300 10*3/uL (ref 150–400)
RBC: 3.77 MIL/uL — ABNORMAL LOW (ref 3.87–5.11)
RDW: 11.9 % (ref 11.5–15.5)
WBC: 8.8 10*3/uL (ref 4.0–10.5)
nRBC: 0 % (ref 0.0–0.2)

## 2022-11-18 LAB — BASIC METABOLIC PANEL
Anion gap: 6 (ref 5–15)
BUN: 10 mg/dL (ref 6–20)
CO2: 26 mmol/L (ref 22–32)
Calcium: 8.8 mg/dL — ABNORMAL LOW (ref 8.9–10.3)
Chloride: 103 mmol/L (ref 98–111)
Creatinine, Ser: 0.79 mg/dL (ref 0.44–1.00)
GFR, Estimated: >60 mL/min (ref >60)
Glucose, Bld: 154 mg/dL — ABNORMAL HIGH (ref 70–99)
Potassium: 3.5 mmol/L (ref 3.5–5.1)
Sodium: 135 mmol/L (ref 135–145)

## 2022-11-18 LAB — HCG, SERUM, QUALITATIVE: Preg, Serum: NEGATIVE

## 2022-11-18 MED ORDER — IBUPROFEN 400 MG PO TABS
400.0000 mg | ORAL_TABLET | Freq: Once | ORAL | Status: AC
  Administered 2022-11-18: 400 mg via ORAL
  Filled 2022-11-18: qty 1

## 2022-11-18 NOTE — ED Provider Notes (Signed)
[place] [facility]GENC[facility] DEPARTMENT AT [facility] Provider Note   CSN: [id number] Arrival date & time: 11/17/22  2216     History  Chief Complaint  Patient presents with   Back Pain    Radiate around to left rib cage    Breanna Malone is a 42 y.o. adult.  The history is provided by the patient.  Patient presents for multiple complaints.  She reports pain in her left back and flank ongoing for months. She also reports now she is having pain in her left abdomen and left lower quadrant.  No fevers or vomiting.  No dysuria.  No chest pain or shortness of breath.  She reports previous history of surgery to her flank and she been having pain around the surgical site     Home Medications Prior to Admission medications   Medication Sig Start Date End Date Taking? Authorizing Provider  albuterol (VENTOLIN HFA) 108 (90 Base) MCG/ACT inhaler INHALE 1 OR 2 PUFFS INTO THE LUNGS EVERY 6 HOURS AS NEEDED FOR WHEEZING. 10/31/22   Gilmore Laroche, FNP  buPROPion (WELLBUTRIN XL) 150 MG 24 hr tablet Take 1 tablet (150 mg total) by mouth daily. 10/31/22   Gilmore Laroche, FNP  cyclobenzaprine (FLEXERIL) 5 MG tablet Take 1 tablet (5 mg total) by mouth 3 (three) times daily as needed for muscle spasms. 10/31/22   Gilmore Laroche, FNP  gabapentin (NEURONTIN) 300 MG capsule Take 1 capsule (300 mg total) by mouth 3 (three) times daily as needed. 10/31/22   Gilmore Laroche, FNP  medroxyPROGESTERone Acetate 150 MG/ML SUSY INJECT 1 ML (150MG ) INTRAMUSCULARLY EVERY 3 MONTHS. 09/25/22   Adline Potter, NP  Multiple Vitamin (MULTIVITAMIN) capsule Take 1 capsule by mouth daily.    [provider]  olmesartan (BENICAR) 20 MG tablet Take 1 tablet (20 mg total) by mouth daily. 10/31/22   Gilmore Laroche, FNP  rosuvastatin (CRESTOR) 40 MG tablet Take 1 tablet (40 mg total) by mouth daily. 10/31/22   Gilmore Laroche, FNP  UNABLE TO FIND RES-Q 105 max 1 daily.    [provider]      Allergies     Darvocet [propoxyphene n-acetaminophen], Hydrocodone-acetaminophen, Lactose intolerance (gi), Other, Penicillins, Latex, and Percocet [oxycodone-acetaminophen]    Review of Systems   Review of Systems  Constitutional:  Negative for fever.  Respiratory:  Negative for shortness of breath.   Cardiovascular:  Negative for chest pain.  Gastrointestinal:  Positive for abdominal pain. Negative for vomiting.  Genitourinary:  Positive for flank pain. Negative for dysuria.    Physical Exam Updated Vital Signs BP (!) 156/91   Pulse 78   Temp 98.6 F (37 C)   Resp 17   Ht 1.626 m (5\' 4" )   Wt 106.6 kg   SpO2 95%   BMI 40.34 kg/m  Physical Exam CONSTITUTIONAL: Well developed/well nourished HEAD: Normocephalic/atraumatic EYES: EOMI/PERRL ENMT: Mucous membranes moist NECK: supple no meningeal signs SPINE/BACK:entire spine nontender CV: S1/S2 noted, no murmurs/rubs/gallops noted LUNGS: Lungs are clear to auscultation bilaterally, no apparent distress ABDOMEN: soft, moderate LLQ tenderness, no rebound or guarding, bowel sounds noted throughout abdomen GU: Left cva tenderness NEURO: Pt is awake/alert/appropriate, moves all extremitiesx4.  No facial droop.   EXTREMITIES: pulses normal/equal, full ROM SKIN: warm, color normal Well-healed thoracotomy scar noted on the left that is mildly tender to palpation, no erythema or fluctuance PSYCH: no abnormalities of mood noted, alert and oriented to situation  ED Results / Procedures / Treatments   Labs (  all labs ordered are listed, but only abnormal results are displayed) Labs Reviewed  CBC WITH DIFFERENTIAL/PLATELET - Abnormal; Notable for the following components:      Result Value   RBC 3.77 (*)    Hemoglobin 11.7 (*)    All other components within normal limits  BASIC METABOLIC PANEL - Abnormal; Notable for the following components:   Glucose, Bld 154 (*)    Calcium 8.8 (*)    All other components within normal limits  URINALYSIS,  ROUTINE W REFLEX MICROSCOPIC - Abnormal; Notable for the following components:   Protein, ur 30 (*)    All other components within normal limits  HCG, SERUM, QUALITATIVE    EKG None  Radiology CT Renal Stone Study  Result Date: 11/18/2022 CLINICAL DATA:  Abdominal/flank pain, stone suspected. Benign mass 2 left-sided back removed 2 years ago with pain for 1-2 weeks. EXAM: CT ABDOMEN AND PELVIS WITHOUT CONTRAST TECHNIQUE: Multidetector CT imaging of the abdomen and pelvis was performed following the standard protocol without IV contrast. RADIATION DOSE REDUCTION: This exam was performed according to the departmental dose-optimization program which includes automated exposure control, adjustment of the mA and/or kV according to patient size and/or use of iterative reconstruction technique. COMPARISON:  01/08/2022. FINDINGS: Lower chest: Strandy atelectasis is present in the left lower lobe. Hepatobiliary: No focal liver abnormality is seen. No gallstones, gallbladder wall thickening, or biliary dilatation. Pancreas: Unremarkable. No pancreatic ductal dilatation or surrounding inflammatory changes. Spleen: Normal in size without focal abnormality. Adrenals/Urinary Tract: The adrenal glands are within normal limits. No renal calculus or hydronephrosis. The bladder is unremarkable. Stomach/Bowel: Stomach is within normal limits. Appendix appears normal. No evidence of bowel wall thickening, distention, or inflammatory changes. No free air or pneumatosis. Vascular/Lymphatic: No significant vascular findings are present. No enlarged abdominal or pelvic lymph nodes. Reproductive: Uterus and bilateral adnexa are unremarkable. Other: No abdominopelvic ascites. Musculoskeletal: No acute osseous abnormality. There has been partial resection of the T10 rib on the left. There is a old healed fracture of the T11 rib on the left. Mild fat stranding is noted in the subcutaneous tissues at the left flank. No focal mass is  seen. IMPRESSION: 1. Postsurgical changes in the left chest wall and flank with no evidence of mass. 2. Mild atelectasis or scarring at the left lung base. 3. No renal calculus or obstructive uropathy bilaterally. Electronically Signed   By: Thornell Sartorius M.D.   On: 11/18/2022 03:23   DG Chest Portable 1 View  Result Date: 11/18/2022 CLINICAL DATA:  Pain. Benign mass of the left side of the back removed 2 years ago. This area started hurting again about 2 weeks ago. EXAM: PORTABLE CHEST 1 VIEW COMPARISON:  November 02, 2021 FINDINGS: Shallow inspiration. Heart size and pulmonary vascularity are normal for technique. Lungs are clear. No pleural effusions. No pneumothorax. Mediastinal contours appear intact. IMPRESSION: No active disease. Electronically Signed   By: Burman Nieves M.D.   On: 11/18/2022 02:16    Procedures Procedures    Medications Ordered in ED Medications  ibuprofen (ADVIL) tablet 400 mg (400 mg Oral Given 11/18/22 0231)    ED Course/ Medical Decision Making/ A&P Clinical Course as of 11/18/22 9147  Wynelle Link Nov 18, 2022  0216 Patient is a very poor historian.  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  Since that time she has had pain in that region that is getting worse.  She now is mentions having left-sided abdominal pain.  It is unclear how acute any of her symptoms are. Start with labs and chest x-ray.  May need CT imaging [DW]  0351 Extensive workup including imaging/chest x-ray, CT renal is unremarkable.  Patient sleeping on reassessment.  She has no new complaints.  Suspect this is a chronic issue, she can follow-up as an outpatient [DW]    Clinical Course User Index [DW] Zadie Rhine, MD                                 Medical Decision Making Amount and/or Complexity of Data Reviewed Labs: ordered. Radiology: ordered.  Risk Prescription drug management.   This patient presents to the ED for concern of flank pain, this involves an  extensive number of treatment options, and is a complaint that carries with it a high risk of complications and morbidity.  The differential diagnosis includes but is not limited to muscle strain, ureteral colic, pyelonephritis, back strain, pneumothorax  Comorbidities that complicate the patient evaluation: Patient's presentation is complicated by their history of obesity  Social Determinants of Health: Patient's  poor health literacy   increases the complexity of managing their presentation  Additional history obtained: Records reviewed previous admission documents  Lab Tests: I Ordered, and personally interpreted labs.  The pertinent results include: Labs overall unremarkable  Imaging Studies ordered: I ordered imaging studies including X-ray chest   I independently visualized and interpreted imaging which showed no acute findings I agree with the radiologist interpretation  Medicines ordered and prescription drug management: I ordered medication including ibuprofen for pain Reevaluation of the patient after these medicines showed that the patient    improved   Reevaluation: After the interventions noted above, I reevaluated the patient and found that they have :improved  Complexity of problems addressed: Patient's presentation is most consistent with  acute presentation with potential threat to life or bodily function  Disposition: After consideration of the diagnostic results and the patient's response to treatment,  I feel that the patent would benefit from discharge   .           Final Clinical Impression(s) / ED Diagnoses Final diagnoses:  Left flank pain    Rx / DC Orders ED Discharge Orders     None         Zadie Rhine, MD 11/18/22 [phone]

## 2022-11-30 ENCOUNTER — Encounter (HOSPITAL_COMMUNITY): Payer: Self-pay

## 2022-11-30 ENCOUNTER — Telehealth (HOSPITAL_COMMUNITY): Payer: Self-pay

## 2022-11-30 NOTE — Telephone Encounter (Signed)
called to confirm no answer vm not set up mychart message sent

## 2022-12-01 DIAGNOSIS — M545 Low back pain, unspecified: Secondary | ICD-10-CM | POA: Diagnosis not present

## 2022-12-01 DIAGNOSIS — R112 Nausea with vomiting, unspecified: Secondary | ICD-10-CM | POA: Diagnosis not present

## 2022-12-01 DIAGNOSIS — Z91040 Latex allergy status: Secondary | ICD-10-CM | POA: Insufficient documentation

## 2022-12-02 ENCOUNTER — Emergency Department (HOSPITAL_COMMUNITY)
Admission: EM | Admit: 2022-12-02 | Discharge: 2022-12-02 | Disposition: A | Discharge status: Home / Self Care | Payer: No Typology Code available for payment source | Attending: Emergency Medicine | Admitting: Emergency Medicine

## 2022-12-02 ENCOUNTER — Encounter (HOSPITAL_COMMUNITY): Payer: Self-pay

## 2022-12-02 ENCOUNTER — Emergency Department (HOSPITAL_COMMUNITY): Payer: No Typology Code available for payment source

## 2022-12-02 ENCOUNTER — Other Ambulatory Visit: Payer: Self-pay

## 2022-12-02 DIAGNOSIS — R112 Nausea with vomiting, unspecified: Secondary | ICD-10-CM

## 2022-12-02 DIAGNOSIS — M545 Low back pain, unspecified: Secondary | ICD-10-CM

## 2022-12-02 LAB — CBC WITH DIFFERENTIAL/PLATELET
Abs Immature Granulocytes: 0.03 10*3/uL (ref 0.00–0.07)
Basophils Absolute: 0.1 10*3/uL (ref 0.0–0.1)
Basophils Relative: 1 %
Eosinophils Absolute: 0.2 10*3/uL (ref 0.0–0.5)
Eosinophils Relative: 2 %
HCT: 38.1 % (ref 36.0–46.0)
Hemoglobin: 12.4 g/dL (ref 12.0–15.0)
Immature Granulocytes: 0 %
Lymphocytes Relative: 43 %
Lymphs Abs: 3.8 10*3/uL (ref 0.7–4.0)
MCH: 31.2 pg (ref 26.0–34.0)
MCHC: 32.5 g/dL (ref 30.0–36.0)
MCV: 95.7 fL (ref 80.0–100.0)
Monocytes Absolute: 0.6 10*3/uL (ref 0.1–1.0)
Monocytes Relative: 6 %
Neutro Abs: 4.3 10*3/uL (ref 1.7–7.7)
Neutrophils Relative %: 48 %
Platelets: 316 10*3/uL (ref 150–400)
RBC: 3.98 MIL/uL (ref 3.87–5.11)
RDW: 11.7 % (ref 11.5–15.5)
WBC: 8.9 10*3/uL (ref 4.0–10.5)
nRBC: 0 % (ref 0.0–0.2)

## 2022-12-02 LAB — COMPREHENSIVE METABOLIC PANEL
ALT: 32 U/L (ref 0–44)
AST: 29 U/L (ref 15–41)
Albumin: 4.1 g/dL (ref 3.5–5.0)
Alkaline Phosphatase: 68 U/L (ref 38–126)
Anion gap: 10 (ref 5–15)
BUN: 6 mg/dL (ref 6–20)
CO2: 26 mmol/L (ref 22–32)
Calcium: 9 mg/dL (ref 8.9–10.3)
Chloride: 101 mmol/L (ref 98–111)
Creatinine, Ser: 0.85 mg/dL (ref 0.44–1.00)
GFR, Estimated: >60 mL/min (ref >60)
Glucose, Bld: 151 mg/dL — ABNORMAL HIGH (ref 70–99)
Potassium: 3.3 mmol/L — ABNORMAL LOW (ref 3.5–5.1)
Sodium: 137 mmol/L (ref 135–145)
Total Bilirubin: 0.4 mg/dL (ref 0.3–1.2)
Total Protein: 7.2 g/dL (ref 6.5–8.1)

## 2022-12-02 LAB — URINALYSIS, ROUTINE W REFLEX MICROSCOPIC
Bilirubin Urine: NEGATIVE
Glucose, UA: NEGATIVE mg/dL
Ketones, ur: NEGATIVE mg/dL
Leukocytes,Ua: NEGATIVE
Nitrite: NEGATIVE
Protein, ur: 30 mg/dL — AB
Specific Gravity, Urine: 1.017 (ref 1.005–1.030)
pH: 6 (ref 5.0–8.0)

## 2022-12-02 LAB — POC URINE PREG, ED: Preg Test, Ur: NEGATIVE

## 2022-12-02 LAB — LIPASE, BLOOD: Lipase: 30 U/L (ref 11–51)

## 2022-12-02 MED ORDER — ONDANSETRON 4 MG PO TBDP: ORAL_TABLET | ORAL | 0 refills | Status: DC

## 2022-12-02 MED ORDER — IOHEXOL 300 MG/ML  SOLN
100.0000 mL | Freq: Once | INTRAMUSCULAR | Status: AC | PRN
  Administered 2022-12-02: 100 mL via INTRAVENOUS

## 2022-12-02 MED ORDER — METOCLOPRAMIDE HCL 5 MG/ML IJ SOLN
10.0000 mg | Freq: Once | INTRAMUSCULAR | Status: AC
  Administered 2022-12-02: 10 mg via INTRAVENOUS
  Filled 2022-12-02: qty 2

## 2022-12-02 MED ORDER — ONDANSETRON HCL 4 MG/2ML IJ SOLN
4.0000 mg | Freq: Once | INTRAMUSCULAR | Status: AC
  Administered 2022-12-02: 4 mg via INTRAVENOUS
  Filled 2022-12-02: qty 2

## 2022-12-02 NOTE — ED Triage Notes (Signed)
Pt reports sudden onset of left sided flank/lower back pain that suddenly started today with nausea and vomiting, pt says she ate Oreos with milk about an hour ago and was able to keep that down, but still has nausea and pain.

## 2022-12-02 NOTE — ED Provider Notes (Signed)
[place] EMERGENCY DEPARTMENT AT [facility] Provider Note   CSN: [id number] Arrival date & time: 12/01/22  2345     History  Chief Complaint  Patient presents with   Flank Pain    Breanna Malone is a 42 y.o. adult.  Presents with complaints of nausea and vomiting.  She reports that she has been experiencing low back pain today.  Pain is across the lower back, not one-sided.  She has not had any dysuria, hematuria.  Menstrual cycle was 3 days ago, no longer bleeding.  No vaginal discharge, no pelvic pain.       Home Medications Prior to Admission medications   Medication Sig Start Date End Date Taking? Authorizing Provider  ondansetron (ZOFRAN-ODT) 4 MG disintegrating tablet 4mg  ODT q4 hours prn nausea/vomit 12/02/22  Yes Inger Wiest, Canary Brim, MD  albuterol (VENTOLIN HFA) 108 (90 Base) MCG/ACT inhaler INHALE 1 OR 2 PUFFS INTO THE LUNGS EVERY 6 HOURS AS NEEDED FOR WHEEZING. 10/31/22   Gilmore Laroche, FNP  buPROPion (WELLBUTRIN XL) 150 MG 24 hr tablet Take 1 tablet (150 mg total) by mouth daily. 10/31/22   Gilmore Laroche, FNP  cyclobenzaprine (FLEXERIL) 5 MG tablet Take 1 tablet (5 mg total) by mouth 3 (three) times daily as needed for muscle spasms. 10/31/22   Gilmore Laroche, FNP  gabapentin (NEURONTIN) 300 MG capsule Take 1 capsule (300 mg total) by mouth 3 (three) times daily as needed. 10/31/22   Gilmore Laroche, FNP  medroxyPROGESTERone Acetate 150 MG/ML SUSY INJECT 1 ML (150MG ) INTRAMUSCULARLY EVERY 3 MONTHS. 09/25/22   Adline Potter, NP  Multiple Vitamin (MULTIVITAMIN) capsule Take 1 capsule by mouth daily.    [provider]  olmesartan (BENICAR) 20 MG tablet Take 1 tablet (20 mg total) by mouth daily. 10/31/22   Gilmore Laroche, FNP  rosuvastatin (CRESTOR) 40 MG tablet Take 1 tablet (40 mg total) by mouth daily. 10/31/22   Gilmore Laroche, FNP  UNABLE TO FIND RES-Q 105 max 1 daily.    [provider]      Allergies    Darvocet  [propoxyphene n-acetaminophen], Hydrocodone-acetaminophen, Lactose intolerance (gi), Other, Penicillins, Latex, and Percocet [oxycodone-acetaminophen]    Review of Systems   Review of Systems  Physical Exam Updated Vital Signs BP 130/76   Pulse 67   Temp 98.2 F (36.8 C) (Oral)   Resp 18   Ht 5\' 4"  (1.626 m)   Wt 106.6 kg   LMP 12/02/2022 (Exact Date)   SpO2 97%   BMI 40.34 kg/m  Physical Exam Vitals and nursing note reviewed.  Constitutional:      General: She is not in acute distress.    Appearance: She is well-developed.  HENT:     Head: Normocephalic and atraumatic.     Mouth/Throat:     Mouth: Mucous membranes are moist.  Eyes:     General: Vision grossly intact. Gaze aligned appropriately.     Extraocular Movements: Extraocular movements intact.     Conjunctiva/sclera: Conjunctivae normal.  Cardiovascular:     Rate and Rhythm: Normal rate and regular rhythm.     Pulses: Normal pulses.     Heart sounds: Normal heart sounds, S1 normal and S2 normal. No murmur heard.    No friction rub. No gallop.  Pulmonary:     Effort: Pulmonary effort is normal. No respiratory distress.     Breath sounds: Normal breath sounds.  Abdominal:     General: Bowel sounds are normal.  Palpations: Abdomen is soft.     Tenderness: There is no abdominal tenderness. There is no guarding or rebound.     Hernia: No hernia is present.  Musculoskeletal:        General: No swelling.     Cervical back: Full passive range of motion without pain, normal range of motion and neck supple. No spinous process tenderness or muscular tenderness. Normal range of motion.     Right lower leg: No edema.     Left lower leg: No edema.  Skin:    General: Skin is warm and dry.     Capillary Refill: Capillary refill takes less than 2 seconds.     Findings: No ecchymosis, erythema, rash or wound.  Neurological:     General: No focal deficit present.     Mental Status: She is alert and oriented to person,  place, and time.     GCS: GCS eye subscore is 4. GCS verbal subscore is 5. GCS motor subscore is 6.     Cranial Nerves: Cranial nerves 2-12 are intact.     Sensory: Sensation is intact.     Motor: Motor function is intact.     Coordination: Coordination is intact.  Psychiatric:        Attention and Perception: Attention normal.        Mood and Affect: Mood normal.        Speech: Speech normal.        Behavior: Behavior normal.     ED Results / Procedures / Treatments   Labs (all labs ordered are listed, but only abnormal results are displayed) Labs Reviewed  COMPREHENSIVE METABOLIC PANEL - Abnormal; Notable for the following components:      Result Value   Potassium 3.3 (*)    Glucose, Bld 151 (*)    All other components within normal limits  URINALYSIS, ROUTINE W REFLEX MICROSCOPIC - Abnormal; Notable for the following components:   Hgb urine dipstick SMALL (*)    Protein, ur 30 (*)    Bacteria, UA RARE (*)    All other components within normal limits  CBC WITH DIFFERENTIAL/PLATELET  LIPASE, BLOOD  POC URINE PREG, ED    EKG None  Radiology CT ABDOMEN PELVIS W CONTRAST  Result Date: 12/02/2022 CLINICAL DATA:  Left-sided flank and lower back pain with nausea and vomiting. EXAM: CT ABDOMEN AND PELVIS WITH CONTRAST TECHNIQUE: Multidetector CT imaging of the abdomen and pelvis was performed using the standard protocol following bolus administration of intravenous contrast. RADIATION DOSE REDUCTION: This exam was performed according to the departmental dose-optimization program which includes automated exposure control, adjustment of the mA and/or kV according to patient size and/or use of iterative reconstruction technique. CONTRAST:  OMNIPAQUE IOHEXOL 300 MG/ML  SOLN COMPARISON:  11/18/2022. FINDINGS: Lower chest: Mild atelectasis or scarring is noted in the left lower lobe. Hepatobiliary: No focal liver abnormality is seen. No gallstones, gallbladder wall thickening, or  biliary dilatation. Pancreas: Unremarkable. No pancreatic ductal dilatation or surrounding inflammatory changes. Spleen: Normal in size without focal abnormality. Adrenals/Urinary Tract: Adrenal glands are within normal limits. The kidneys enhance symmetrically. No renal calculus or obstructive uropathy bilaterally. Evaluation of the distal ureters is limited due to overlapping structures and phleboliths in the pelvis. The bladder is within normal limits. Stomach/Bowel: Stomach is within normal limits. Appendix appears normal. No evidence of bowel wall thickening, distention, or inflammatory changes. No free air or pneumatosis. Vascular/Lymphatic: No significant vascular findings are present. No enlarged abdominal  or pelvic lymph nodes. Reproductive: Uterus and bilateral adnexa are unremarkable. Other: No abdominopelvic ascites. Musculoskeletal: Stable postsurgical changes in the left chest wall and flank are noted with partial rib resection at T10 on the left. No acute osseous abnormality. IMPRESSION: 1. No acute intra-abdominal process. 2. No renal calculus or obstructive uropathy. 3. Stable postsurgical changes in the left chest wall and flank. Electronically Signed   By: Thornell Sartorius M.D.   On: 12/02/2022 02:55    Procedures Procedures    Medications Ordered in ED Medications  ondansetron (ZOFRAN) injection 4 mg (4 mg Intravenous Given 12/02/22 0126)  metoCLOPramide (REGLAN) injection 10 mg (10 mg Intravenous Given 12/02/22 0127)  iohexol (OMNIPAQUE) 300 MG/ML solution 100 mL (100 mLs Intravenous Contrast Given 12/02/22 0222)    ED Course/ Medical Decision Making/ A&P                                 Medical Decision Making Amount and/or Complexity of Data Reviewed Labs: ordered. Radiology: ordered.  Risk Prescription drug management.   Presents with low back pain and now with nausea and vomiting.  No abdominal or pelvic pain.  No neurologic complaints.  She has not noticed any urinary  symptoms.  Back pain is not focal, diffuse low back.  No radiation, normal lower extremity strength and sensation.  No red flags.  She denies any injury.  Lab work unremarkable.  This includes no evidence of urinary tract infection, normal white blood cell count.  CT abdomen and pelvis without acute findings.  Patient reassured, discharged with symptomatic treatment.        Final Clinical Impression(s) / ED Diagnoses Final diagnoses:  Acute bilateral low back pain without sciatica  Nausea and vomiting, unspecified vomiting type    Rx / DC Orders ED Discharge Orders          Ordered    ondansetron (ZOFRAN-ODT) 4 MG disintegrating tablet        12/02/22 0351              Gilda Crease, MD 12/02/22 [phone]

## 2022-12-03 NOTE — Telephone Encounter (Signed)
Called to confirm no answer no vm, appt confirmed today via text with automated system

## 2022-12-03 NOTE — Telephone Encounter (Signed)
Patient picked up FL2 form

## 2022-12-04 ENCOUNTER — Encounter: Payer: Self-pay | Admitting: Family Medicine

## 2022-12-04 ENCOUNTER — Ambulatory Visit (HOSPITAL_COMMUNITY): Payer: Medicaid Other | Admitting: Psychiatry

## 2022-12-04 ENCOUNTER — Ambulatory Visit (HOSPITAL_COMMUNITY)
Admission: RE | Admit: 2022-12-04 | Discharge: 2022-12-04 | Disposition: A | Discharge status: Home / Self Care | Payer: No Typology Code available for payment source | Source: Ambulatory Visit | Attending: Family Medicine | Admitting: Family Medicine

## 2022-12-04 ENCOUNTER — Ambulatory Visit (INDEPENDENT_AMBULATORY_CARE_PROVIDER_SITE_OTHER): Payer: No Typology Code available for payment source | Admitting: Family Medicine

## 2022-12-04 VITALS — BP 131/91 | HR 76 | Wt 237.1 lb

## 2022-12-04 DIAGNOSIS — G8929 Other chronic pain: Secondary | ICD-10-CM

## 2022-12-04 DIAGNOSIS — I10 Essential (primary) hypertension: Secondary | ICD-10-CM | POA: Diagnosis not present

## 2022-12-04 DIAGNOSIS — R7301 Impaired fasting glucose: Secondary | ICD-10-CM

## 2022-12-04 DIAGNOSIS — E7849 Other hyperlipidemia: Secondary | ICD-10-CM

## 2022-12-04 DIAGNOSIS — M545 Low back pain, unspecified: Secondary | ICD-10-CM | POA: Insufficient documentation

## 2022-12-04 DIAGNOSIS — F320 Major depressive disorder, single episode, mild: Secondary | ICD-10-CM | POA: Diagnosis not present

## 2022-12-04 DIAGNOSIS — E559 Vitamin D deficiency, unspecified: Secondary | ICD-10-CM

## 2022-12-04 DIAGNOSIS — E038 Other specified hypothyroidism: Secondary | ICD-10-CM | POA: Diagnosis not present

## 2022-12-04 MED ORDER — METHYLPREDNISOLONE 4 MG PO TBPK
ORAL_TABLET | ORAL | 0 refills | Status: DC
Start: 2022-12-04 — End: 2022-12-04

## 2022-12-04 MED ORDER — METHYLPREDNISOLONE 4 MG PO TBPK: ORAL_TABLET | ORAL | 0 refills | Status: DC

## 2022-12-04 MED ORDER — CYCLOBENZAPRINE HCL 5 MG PO TABS: 5.0000 mg | ORAL_TABLET | Freq: Every evening | ORAL | 1 refills | Status: DC | PRN

## 2022-12-04 NOTE — Progress Notes (Signed)
Established Patient Office Visit  Subjective:  Patient ID: Breanna Malone, adult    DOB: 04-Oct-1980  Age: 42 y.o. MRN: [id number]  CC:  Chief Complaint  Patient presents with   Back Pain    Pt reports back pain from the rib pain and left arm pain too from surgery a year ago.     HPI Breanna Malone is a 42 y.o. adult with past medical history of MDD, hyperlipidemia, and hypertension presents for f/u of  chronic medical conditions.  Acute Low Back Pain Without Sciatica: The patient complains of bilateral low back pain, which she rates 8/10. She denies any numbness, tingling, fever, chills, unintentional weight loss, or bowel/bladder incontinence. No recent injury or trauma reported. Of note, the patient's BMI is 40.69. She also complains of occasional stiffness in her left shoulder, though none is noted today.  Hyperlipidemia: She takes rosuvastatin 40 mg daily and reports treatment compliance.  Hypertension: She takes olmesartan 20 mg daily but reports not taking her medication today. However, she is asymptomatic.  Depression: Her symptoms are well-controlled on Wellbutrin 150 mg daily. She is under the care of a psychiatrist and denies suicidal thoughts or ideation.    Past Medical History:  Diagnosis Date   Abnormal Pap smear    Arthritis    Asthma    Asthma    Bipolar 1 disorder (HCC)    Bone spur    rib   Bronchitis    BV (bacterial vaginosis) 10/08/2019   8/19 rx flagyl   Carpal tunnel syndrome    Cervical spine pain    Chest pain    Diabetes mellitus without complication (HCC)    Elevated cholesterol    High cholesterol    Hyperlipidemia    Hyperlipoproteinemia    Hypertension    Post traumatic stress disorder    Right ovarian cyst 10/31/2012   Complex right ovarian cyst seen 8/18 in ER need f/u US   Tendonitis    Vaginal Pap smear, abnormal     Past Surgical History:  Procedure Laterality Date   CARPAL TUNNEL RELEASE Right 06/28/2016   Procedure: RIGHT  CARPAL TUNNEL RELEASE;  Surgeon: Vickki Hearing, MD;  Location: [facility];  Service: Orthopedics;  Laterality: Right;   HERNIA REPAIR     umbilical   RIB RESECTION Left 03/16/2021   Procedure: RESECTION OF TUMOR LEFT NINTH RIB;  Surgeon: Loreli Slot, MD;  Location: [facility] OR;  Service: Thoracic;  Laterality: Left;    Family History  Problem Relation Age of Onset   Diabetes Mother    Hypertension Mother    Asthma Father    Bronchitis Daughter    Asthma Daughter    Asthma Daughter    Bronchitis Daughter    Asthma Son    Bronchitis Son    Asthma Son    Stroke Paternal Grandmother    Heart attack Paternal Grandmother    Cancer Paternal Grandmother    Cancer Cousin    Asthma Other    Bronchitis Other     Social History   Socioeconomic History   Marital status: Widowed    Spouse name: Not on file   Number of children: Not on file   Years of education: Not on file   Highest education level: Not on file  Occupational History   Not on file  Tobacco Use   Smoking status: Never   Smokeless tobacco: Never  Vaping Use   Vaping status: Never Used  Substance  and Sexual Activity   Alcohol use: No   Drug use: No   Sexual activity: Yes    Birth control/protection: Condom  Other Topics Concern   Not on file  Social History Narrative   Not on file   Social Determinants of Health   Financial Resource Strain: Low Risk  (07/27/2020)   Overall Financial Resource Strain (CARDIA)    Difficulty of Paying Living Expenses: Not hard at all  Food Insecurity: No Food Insecurity (07/27/2020)   Hunger Vital Sign    Worried About Running Out of Food in the Last Year: Never true    Ran Out of Food in the Last Year: Never true  Transportation Needs: Unmet Transportation Needs (07/27/2020)   PRAPARE - Transportation    Lack of Transportation (Medical): No    Lack of Transportation (Non-Medical): Yes  Physical Activity: Insufficiently Active (07/27/2020)   Exercise Vital Sign    Days of  Exercise per Week: 2 days    Minutes of Exercise per Session: 30 min  Stress: No Stress Concern Present (07/27/2020)   [employer] of Occupational Health - Occupational Stress Questionnaire    Feeling of Stress : Not at all  Social Connections: Moderately Isolated (07/27/2020)   Social Connection and Isolation Panel [NHANES]    Frequency of Communication with Friends and Family: Three times a week    Frequency of Social Gatherings with Friends and Family: Three times a week    Attends Religious Services: 1 to 4 times per year    Active Member of Clubs or Organizations: No    Attends Banker Meetings: Never    Marital Status: Widowed  Intimate Partner Violence: Not At Risk (07/27/2020)   Humiliation, Afraid, Rape, and Kick questionnaire    Fear of Current or Ex-Partner: No    Emotionally Abused: No    Physically Abused: No    Sexually Abused: No    Outpatient Medications Prior to Visit  Medication Sig Dispense Refill   albuterol (VENTOLIN HFA) 108 (90 Base) MCG/ACT inhaler INHALE 1 OR 2 PUFFS INTO THE LUNGS EVERY 6 HOURS AS NEEDED FOR WHEEZING. 8.5 g 0   buPROPion (WELLBUTRIN XL) 150 MG 24 hr tablet Take 1 tablet (150 mg total) by mouth daily. 30 tablet 0   gabapentin (NEURONTIN) 300 MG capsule Take 1 capsule (300 mg total) by mouth 3 (three) times daily as needed. 30 capsule 0   medroxyPROGESTERone Acetate 150 MG/ML SUSY INJECT 1 ML (150MG ) INTRAMUSCULARLY EVERY 3 MONTHS. 1 mL 0   Multiple Vitamin (MULTIVITAMIN) capsule Take 1 capsule by mouth daily.     olmesartan (BENICAR) 20 MG tablet Take 1 tablet (20 mg total) by mouth daily. 30 tablet 0   ondansetron (ZOFRAN-ODT) 4 MG disintegrating tablet 4mg  ODT q4 hours prn nausea/vomit 10 tablet 0   rosuvastatin (CRESTOR) 40 MG tablet Take 1 tablet (40 mg total) by mouth daily. 90 tablet 3   UNABLE TO FIND RES-Q 105 max 1 daily.     cyclobenzaprine (FLEXERIL) 5 MG tablet Take 1 tablet (5 mg total) by mouth 3 (three) times daily  as needed for muscle spasms. 30 tablet 1   No facility-administered medications prior to visit.    Allergies  Allergen Reactions   Darvocet [Propoxyphene N-Acetaminophen] Itching   Hydrocodone-Acetaminophen Itching   Lactose Intolerance (Gi) Other (See Comments)    Causes stomach cramping.   Other Hives    Patient is allergic to corndogs.   Penicillins Hives and Itching  Has patient had a PCN reaction causing immediate rash, facial/tongue/throat swelling, SOB or lightheadedness with hypotension:Yes Has patient had a PCN reaction causing severe rash involving mucus membranes or skin necrosis:No Has patient had a PCN reaction that required hospitalization:No Has patient had a PCN reaction occurring within the last 10 years:No If all of the above answers are "NO", then may proceed with Cephalosporin use.    Latex Itching and Rash   Percocet [Oxycodone-Acetaminophen] Itching and Rash    ROS Review of Systems  Constitutional:  Negative for chills and fever.  Eyes:  Negative for visual disturbance.  Respiratory:  Negative for chest tightness and shortness of breath.   Musculoskeletal:  Positive for back pain.  Neurological:  Negative for dizziness and headaches.      Objective:    Physical Exam HENT:     Head: Normocephalic.     Mouth/Throat:     Mouth: Mucous membranes are moist.  Cardiovascular:     Rate and Rhythm: Normal rate.     Heart sounds: Normal heart sounds.  Pulmonary:     Effort: Pulmonary effort is normal.     Breath sounds: Normal breath sounds.  Musculoskeletal:     Lumbar back: Tenderness present.  Neurological:     Mental Status: She is alert.     BP (!) 131/91   Pulse 76   Wt 237 lb 1.3 oz (107.5 kg)   LMP 12/02/2022 (Exact Date)   SpO2 93%   BMI 40.69 kg/m  Wt Readings from Last 3 Encounters:  12/04/22 237 lb 1.3 oz (107.5 kg)  12/02/22 235 lb 0.2 oz (106.6 kg)  11/17/22 235 lb (106.6 kg)    Lab Results  Component Value Date   TSH  0.756 07/12/2022   Lab Results  Component Value Date   WBC 8.9 12/02/2022   HGB 12.4 12/02/2022   HCT 38.1 12/02/2022   MCV 95.7 12/02/2022   PLT 316 12/02/2022   Lab Results  Component Value Date   NA 137 12/02/2022   K 3.3 (L) 12/02/2022   CO2 26 12/02/2022   GLUCOSE 151 (H) 12/02/2022   BUN 6 12/02/2022   CREATININE 0.85 12/02/2022   BILITOT 0.4 12/02/2022   ALKPHOS 68 12/02/2022   AST 29 12/02/2022   ALT 32 12/02/2022   PROT 7.2 12/02/2022   ALBUMIN 4.1 12/02/2022   CALCIUM 9.0 12/02/2022   ANIONGAP 10 12/02/2022   EGFR 91 07/12/2022   Lab Results  Component Value Date   CHOL 271 (H) 07/12/2022   Lab Results  Component Value Date   HDL 45 07/12/2022   Lab Results  Component Value Date   LDLCALC 205 (H) 07/12/2022   Lab Results  Component Value Date   TRIG 115 07/12/2022   Lab Results  Component Value Date   CHOLHDL 6.0 (H) 07/12/2022   Lab Results  Component Value Date   HGBA1C 6.1 (H) 07/12/2022      Assessment & Plan:  Primary hypertension Assessment & Plan: Uncontrolled Hypertension: Likely due to the patient not taking her blood pressure medication today. She is encouraged to resume prednisone 20 mg daily. A low-sodium diet and increased physical activity are also encouraged.    Orders: -     TSH + free T4  Acute bilateral low back pain without sciatica Assessment & Plan: Acute bilateral low back pain -Will get an X-ray of the lower back -A Medrol Dosepak (a steroid) has been prescribed to reduce inflammation and pain, and it's  important to follow the instructions on the package carefully.  Key points to remember: -Avoid ibuprofen or other NSAIDs while on Medrol Dosepak to reduce the risk of gastrointestinal bleeding or ulcers. -Tylenol can be taken for pain relief if needed. Nonpharmacological interventions include heat application (10-15 minutes, 3-4 times daily) and gentle stretching/strengthening exercises. Avoid any activities  that may worsen your back pain.    Acute bilateral low back pain without sciatica -     DG Lumbar Spine Complete -     methylPREDNISolone; Take as the package instructed  Dispense: 1 each; Refill: 0 -     Cyclobenzaprine HCl; Take 1 tablet (5 mg total) by mouth at bedtime as needed for muscle spasms.  Dispense: 30 tablet; Refill: 1  Current mild episode of major depressive disorder, unspecified whether recurrent (HCC) Assessment & Plan: Stable on Wellbutrin 150 mg daily. The patient denies suicidal thoughts or ideation. Nonpharmacological interventions for depression, including mindfulness, meditation, deep breathing exercises, a well-balanced diet, and increased physical activity, were reviewed. Flowsheet Row Office Visit from 12/04/2022 in [facility]  PHQ-9 Total Score 4          Other hyperlipidemia Assessment & Plan: Stable on rosuvastatin 40 mg daily. The patient was educated to decrease her intake of greasy, fatty, and starchy foods, and to increase physical activity. Lab Results  Component Value Date   CHOL 271 (H) 07/12/2022   HDL 45 07/12/2022   LDLCALC 205 (H) 07/12/2022   TRIG 115 07/12/2022   CHOLHDL 6.0 (H) 07/12/2022      Orders: -     Lipid panel -     CMP14+EGFR -     CBC with Differential/Platelet  IFG (impaired fasting glucose) -     Hemoglobin A1c  Vitamin D deficiency -     VITAMIN D 25 Hydroxy (Vit-D Deficiency, Fractures)  Other specified hypothyroidism  Note: This chart has been completed using Engineer, civil (consulting) software, and while attempts have been made to ensure accuracy, certain words and phrases may not be transcribed as intended.    Follow-up: Return in about 4 months (around 04/06/2023).   Gilmore Laroche, FNP

## 2022-12-04 NOTE — Patient Instructions (Addendum)
I appreciate the opportunity to provide care to you today!    Follow up:  4 months  Fasting Labs: please stop by the lab during the week to get your blood drawn (CBC, CMP, TSH, Lipid profile, HgA1c, Vit D)  Acute bilateral low back pain -you've been advised to get an X-ray at [facility] to help assess the cause of the pain. - A Medrol Dosepak (a steroid) has been prescribed to reduce inflammation and pain, and it's important to follow the instructions on the package carefully.  Key points to remember: -Avoid ibuprofen or other NSAIDs while on Medrol Dosepak to reduce the risk of gastrointestinal bleeding or ulcers. -Tylenol can be taken for pain relief if needed. Nonpharmacological interventions include heat application (10-15 minutes, 3-4 times daily) and gentle stretching/strengthening exercises. Avoid any activities that may worsen your back pain.   Attached with your AVS, you will find valuable resources for self-education. I highly recommend dedicating some time to thoroughly examine them.   Please continue to a heart-healthy diet and increase your physical activities. Try to exercise for at least five days a week.    It was a pleasure to see you and I look forward to continuing to work together on your health and well-being. Please do not hesitate to call the office if you need care or have questions about your care.  In case of emergency, please visit the Emergency Department for urgent care, or contact our clinic at [phone] to schedule an appointment. We're here to help you!   Have a wonderful day and week. With Gratitude, Gilmore Laroche MSN, FNP-BC

## 2022-12-05 ENCOUNTER — Encounter: Payer: Medicaid Other | Admitting: Orthopedic Surgery

## 2022-12-07 ENCOUNTER — Encounter: Payer: Medicaid Other | Admitting: Orthopedic Surgery

## 2022-12-07 ENCOUNTER — Telehealth: Payer: Self-pay | Admitting: Family Medicine

## 2022-12-07 DIAGNOSIS — M545 Low back pain, unspecified: Secondary | ICD-10-CM | POA: Insufficient documentation

## 2022-12-07 NOTE — Telephone Encounter (Signed)
Patient called about xray report on back. Asking for a call back

## 2022-12-07 NOTE — Assessment & Plan Note (Addendum)
Acute bilateral low back pain -Will get an X-ray of the lower back -A Medrol Dosepak (a steroid) has been prescribed to reduce inflammation and pain, and it's important to follow the instructions on the package carefully.  Key points to remember: -Avoid ibuprofen or other NSAIDs while on Medrol Dosepak to reduce the risk of gastrointestinal bleeding or ulcers. -Tylenol can be taken for pain relief if needed. Nonpharmacological interventions include heat application (10-15 minutes, 3-4 times daily) and gentle stretching/strengthening exercises. Avoid any activities that may worsen your back pain.

## 2022-12-07 NOTE — Assessment & Plan Note (Addendum)
Stable on Wellbutrin 150 mg daily. The patient denies suicidal thoughts or ideation. Nonpharmacological interventions for depression, including mindfulness, meditation, deep breathing exercises, a well-balanced diet, and increased physical activity, were reviewed. Flowsheet Row Office Visit from 12/04/2022 in [facility]  PHQ-9 Total Score 4

## 2022-12-07 NOTE — Assessment & Plan Note (Signed)
Uncontrolled Hypertension: Likely due to the patient not taking her blood pressure medication today. She is encouraged to resume prednisone 20 mg daily. A low-sodium diet and increased physical activity are also encouraged.

## 2022-12-07 NOTE — Assessment & Plan Note (Addendum)
Stable on rosuvastatin 40 mg daily. The patient was educated to decrease her intake of greasy, fatty, and starchy foods, and to increase physical activity. Lab Results  Component Value Date   CHOL 271 (H) 07/12/2022   HDL 45 07/12/2022   LDLCALC 205 (H) 07/12/2022   TRIG 115 07/12/2022   CHOLHDL 6.0 (H) 07/12/2022

## 2022-12-07 NOTE — Telephone Encounter (Signed)
We have not gotten the results. She will see the results come up in her mychart once they are back and then someone will be in contact with her to discuss.

## 2022-12-10 ENCOUNTER — Other Ambulatory Visit: Payer: Self-pay

## 2022-12-10 ENCOUNTER — Telehealth: Payer: Self-pay | Admitting: Family Medicine

## 2022-12-10 ENCOUNTER — Other Ambulatory Visit: Payer: Self-pay | Admitting: Family Medicine

## 2022-12-10 DIAGNOSIS — E7849 Other hyperlipidemia: Secondary | ICD-10-CM

## 2022-12-10 DIAGNOSIS — I10 Essential (primary) hypertension: Secondary | ICD-10-CM

## 2022-12-10 DIAGNOSIS — G8929 Other chronic pain: Secondary | ICD-10-CM

## 2022-12-10 DIAGNOSIS — J4520 Mild intermittent asthma, uncomplicated: Secondary | ICD-10-CM

## 2022-12-10 DIAGNOSIS — F339 Major depressive disorder, recurrent, unspecified: Secondary | ICD-10-CM

## 2022-12-10 MED ORDER — GABAPENTIN 300 MG PO CAPS: 300.0000 mg | ORAL_CAPSULE | Freq: Three times a day (TID) | ORAL | 0 refills | Status: DC | PRN

## 2022-12-10 MED ORDER — BUPROPION HCL ER (XL) 150 MG PO TB24
150.0000 mg | ORAL_TABLET | Freq: Every day | ORAL | 0 refills | Status: DC
Start: 2022-12-10 — End: 2023-01-22

## 2022-12-10 MED ORDER — ONDANSETRON 4 MG PO TBDP: ORAL_TABLET | ORAL | 0 refills | Status: DC

## 2022-12-10 MED ORDER — ALBUTEROL SULFATE HFA 108 (90 BASE) MCG/ACT IN AERS: INHALATION_SPRAY | RESPIRATORY_TRACT | 0 refills | Status: DC

## 2022-12-10 MED ORDER — OLMESARTAN MEDOXOMIL 20 MG PO TABS: 20.0000 mg | ORAL_TABLET | Freq: Every day | ORAL | 0 refills | Status: DC

## 2022-12-10 MED ORDER — ROSUVASTATIN CALCIUM 40 MG PO TABS: 40.0000 mg | ORAL_TABLET | Freq: Every day | ORAL | 3 refills | Status: DC

## 2022-12-10 MED ORDER — CYCLOBENZAPRINE HCL 5 MG PO TABS
5.0000 mg | ORAL_TABLET | Freq: Every evening | ORAL | 1 refills | Status: DC | PRN
Start: 2022-12-10 — End: 2023-02-25

## 2022-12-10 NOTE — Telephone Encounter (Signed)
All meds sent to [facility] [address]

## 2022-12-10 NOTE — Telephone Encounter (Signed)
Patient came by office requesting letter to become back in charge of own personal finances. Persons currently in charge is set to retire 1 week of November    Prescription Request  12/10/2022  LOV: 12/04/2022  What is the name of the medication or equipment? ALL ACTIVE MEDS  Have you contacted your pharmacy to request a refill? Yes   Which pharmacy would you like this sent to?    [id number] - [place], [place] - [address] [place] [address] Phone: [phone] Fax: [phone]    Patient notified that their request is being sent to the clinical staff for review and that they should receive a response within 2 business days.   Please advise at Mobile [phone] (mobile)

## 2022-12-17 ENCOUNTER — Ambulatory Visit (HOSPITAL_COMMUNITY)
Admission: RE | Admit: 2022-12-17 | Discharge: 2022-12-17 | Disposition: A | Discharge status: Home / Self Care | Payer: No Typology Code available for payment source | Source: Ambulatory Visit | Attending: Family Medicine | Admitting: Family Medicine

## 2022-12-17 ENCOUNTER — Ambulatory Visit (INDEPENDENT_AMBULATORY_CARE_PROVIDER_SITE_OTHER): Payer: Medicaid Other | Admitting: Family Medicine

## 2022-12-17 ENCOUNTER — Encounter: Payer: Self-pay | Admitting: Family Medicine

## 2022-12-17 VITALS — BP 144/79 | HR 69 | Resp 16 | Ht 64.0 in | Wt 236.0 lb

## 2022-12-17 DIAGNOSIS — M546 Pain in thoracic spine: Secondary | ICD-10-CM | POA: Diagnosis not present

## 2022-12-17 DIAGNOSIS — Z6841 Body Mass Index (BMI) 40.0 and over, adult: Secondary | ICD-10-CM | POA: Diagnosis not present

## 2022-12-17 NOTE — Assessment & Plan Note (Signed)
The patient is encouraged to continue implementing lifestyle changes for at least 3 months. She admits that she would not be able to give herself Wegovy injections monthly.  Healthy Tips for Weight Loss were reviewed, and the following recommendations were made for weight loss management:  Recommendations for Weight Loss Management: Emphasize Lifestyle Changes: A heart-healthy diet and increased physical activity are crucial for success. Healthy Tips for Weight Loss: Increase Intake of Nutrient-Rich Foods: Prioritize fruits, vegetables, and whole grains. Incorporate Lean Proteins: Include chicken, fish, beans, and legumes in your diet. Choose Low-Fat Dairy Products: Opt for low-fat versions of dairy products. Reduce Unhealthy Fats: Limit intake of saturated fats, trans fats, and cholesterol. Aim for Regular Physical Activity: Engage in at least 30 minutes of brisk walking or other physical activities on at least 5 days a week.

## 2022-12-17 NOTE — Progress Notes (Signed)
Established Patient Office Visit  Subjective:  Patient ID: Breanna Malone, adult    DOB: 1980-09-19  Age: 42 y.o. MRN: [id number]  CC:  Chief Complaint  Patient presents with   Back Pain    Still having bad pain in her back and never heard back from the xrays    Obesity    Wants to discuss the wegovy/ozempic injections to see if she can get rx    HPI Breanna Malone is a 42 y.o. adult with past medical history of obesity, enchondroma of the bone presents for f/u of  chronic medical conditions.  Left-Sided Thoracic Back Pain:The patient reports constant thoracic back pain since starting the Depo shot. 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 The pain is described as sharp and dull, rated 6-7 out of 10, and worsens with increased activity. She denies any numbness, tingling, bowel, or bladder incontinence. No recent injury or trauma is reported.  Obesity:The patient reports increased weight gain and admits to unhealthy eating habits with minimal physical activity. She expresses a desire to take better care of herself and is interested in exploring weight loss options.   Past Medical History:  Diagnosis Date   Abnormal Pap smear    Arthritis    Asthma    Asthma    Bipolar 1 disorder (HCC)    Bone spur    rib   Bronchitis    BV (bacterial vaginosis) 10/08/2019   8/19 rx flagyl   Carpal tunnel syndrome    Cervical spine pain    Chest pain    Diabetes mellitus without complication (HCC)    Elevated cholesterol    High cholesterol    Hyperlipidemia    Hyperlipoproteinemia    Hypertension    Post traumatic stress disorder    Right ovarian cyst 10/31/2012   Complex right ovarian cyst seen 8/18 in ER need f/u US   Tendonitis    Vaginal Pap smear, abnormal     Past Surgical History:  Procedure Laterality Date   CARPAL TUNNEL RELEASE Right 06/28/2016   Procedure: RIGHT CARPAL TUNNEL RELEASE;  Surgeon:  Vickki Hearing, MD;  Location: [facility];  Service: Orthopedics;  Laterality: Right;   HERNIA REPAIR     umbilical   RIB RESECTION Left 03/16/2021   Procedure: RESECTION OF TUMOR LEFT NINTH RIB;  Surgeon: Loreli Slot, MD;  Location: [facility] OR;  Service: Thoracic;  Laterality: Left;    Family History  Problem Relation Age of Onset   Diabetes Mother    Hypertension Mother    Asthma Father    Bronchitis Daughter    Asthma Daughter    Asthma Daughter    Bronchitis Daughter    Asthma Son    Bronchitis Son    Asthma Son    Stroke Paternal Grandmother    Heart attack Paternal Grandmother    Cancer Paternal Grandmother    Cancer Cousin    Asthma Other    Bronchitis Other     Social History   Socioeconomic History   Marital status: Widowed    Spouse name: Not on file   Number of children: Not on file   Years of education: Not on file   Highest education level: Not on file  Occupational History   Not on file  Tobacco Use   Smoking status: Never   Smokeless tobacco: Never  Vaping Use   Vaping  status: Never Used  Substance and Sexual Activity   Alcohol use: No   Drug use: No   Sexual activity: Yes    Birth control/protection: Condom  Other Topics Concern   Not on file  Social History Narrative   Not on file   Social Determinants of Health   Financial Resource Strain: Low Risk  (07/27/2020)   Overall Financial Resource Strain (CARDIA)    Difficulty of Paying Living Expenses: Not hard at all  Food Insecurity: No Food Insecurity (07/27/2020)   Hunger Vital Sign    Worried About Running Out of Food in the Last Year: Never true    Ran Out of Food in the Last Year: Never true  Transportation Needs: Unmet Transportation Needs (07/27/2020)   PRAPARE - Transportation    Lack of Transportation (Medical): No    Lack of Transportation (Non-Medical): Yes  Physical Activity: Insufficiently Active (07/27/2020)   Exercise Vital Sign    Days of Exercise per Week: 2 days     Minutes of Exercise per Session: 30 min  Stress: No Stress Concern Present (07/27/2020)   [employer] of Occupational Health - Occupational Stress Questionnaire    Feeling of Stress : Not at all  Social Connections: Moderately Isolated (07/27/2020)   Social Connection and Isolation Panel [NHANES]    Frequency of Communication with Friends and Family: Three times a week    Frequency of Social Gatherings with Friends and Family: Three times a week    Attends Religious Services: 1 to 4 times per year    Active Member of Clubs or Organizations: No    Attends Banker Meetings: Never    Marital Status: Widowed  Intimate Partner Violence: Not At Risk (07/27/2020)   Humiliation, Afraid, Rape, and Kick questionnaire    Fear of Current or Ex-Partner: No    Emotionally Abused: No    Physically Abused: No    Sexually Abused: No    Outpatient Medications Prior to Visit  Medication Sig Dispense Refill   albuterol (VENTOLIN HFA) 108 (90 Base) MCG/ACT inhaler INHALE 1 OR 2 PUFFS INTO THE LUNGS EVERY 6 HOURS AS NEEDED FOR WHEEZING. 8.5 g 0   buPROPion (WELLBUTRIN XL) 150 MG 24 hr tablet Take 1 tablet (150 mg total) by mouth daily. 30 tablet 0   cyclobenzaprine (FLEXERIL) 5 MG tablet Take 1 tablet (5 mg total) by mouth at bedtime as needed for muscle spasms. 30 tablet 1   gabapentin (NEURONTIN) 300 MG capsule Take 1 capsule (300 mg total) by mouth 3 (three) times daily as needed. 30 capsule 0   medroxyPROGESTERone Acetate 150 MG/ML SUSY INJECT 1 ML (150MG ) INTRAMUSCULARLY EVERY 3 MONTHS. 1 mL 0   Multiple Vitamin (MULTIVITAMIN) capsule Take 1 capsule by mouth daily.     olmesartan (BENICAR) 20 MG tablet TAKE 1 TABLET(20 MG) BY MOUTH DAILY 90 tablet 1   ondansetron (ZOFRAN-ODT) 4 MG disintegrating tablet 4mg  ODT q4 hours prn nausea/vomit 10 tablet 0   rosuvastatin (CRESTOR) 40 MG tablet Take 1 tablet (40 mg total) by mouth daily. 90 tablet 3   UNABLE TO FIND RES-Q 105 max 1 daily.      methylPREDNISolone (MEDROL DOSEPAK) 4 MG TBPK tablet Take as the package instructed 1 each 0   No facility-administered medications prior to visit.    Allergies  Allergen Reactions   Darvocet [Propoxyphene N-Acetaminophen] Itching   Hydrocodone-Acetaminophen Itching   Lactose Intolerance (Gi) Other (See Comments)    Causes stomach cramping.  Other Hives    Patient is allergic to corndogs.   Penicillins Hives and Itching    Has patient had a PCN reaction causing immediate rash, facial/tongue/throat swelling, SOB or lightheadedness with hypotension:Yes Has patient had a PCN reaction causing severe rash involving mucus membranes or skin necrosis:No Has patient had a PCN reaction that required hospitalization:No Has patient had a PCN reaction occurring within the last 10 years:No If all of the above answers are "NO", then may proceed with Cephalosporin use.    Latex Itching and Rash   Percocet [Oxycodone-Acetaminophen] Itching and Rash    ROS Review of Systems  Constitutional:  Negative for chills and fever.  Eyes:  Negative for visual disturbance.  Respiratory:  Negative for chest tightness and shortness of breath.   Musculoskeletal:  Positive for back pain.  Neurological:  Negative for dizziness and headaches.      Objective:    Physical Exam Constitutional:      Appearance: She is obese.  HENT:     Head: Normocephalic.     Mouth/Throat:     Mouth: Mucous membranes are moist.  Cardiovascular:     Rate and Rhythm: Normal rate.     Heart sounds: Normal heart sounds.  Pulmonary:     Effort: Pulmonary effort is normal.     Breath sounds: Normal breath sounds.  Musculoskeletal:     Thoracic back: Tenderness present. No swelling.  Neurological:     Mental Status: She is alert.     BP (!) 144/79   Pulse 69   Resp 16   Ht 5\' 4"  (1.626 m)   Wt 236 lb (107 kg)   LMP 12/02/2022 (Exact Date)   SpO2 96%   BMI 40.51 kg/m  Wt Readings from Last 3 Encounters:   12/17/22 236 lb (107 kg)  12/04/22 237 lb 1.3 oz (107.5 kg)  12/02/22 235 lb 0.2 oz (106.6 kg)    Lab Results  Component Value Date   TSH 0.756 07/12/2022   Lab Results  Component Value Date   WBC 8.9 12/02/2022   HGB 12.4 12/02/2022   HCT 38.1 12/02/2022   MCV 95.7 12/02/2022   PLT 316 12/02/2022   Lab Results  Component Value Date   NA 137 12/02/2022   K 3.3 (L) 12/02/2022   CO2 26 12/02/2022   GLUCOSE 151 (H) 12/02/2022   BUN 6 12/02/2022   CREATININE 0.85 12/02/2022   BILITOT 0.4 12/02/2022   ALKPHOS 68 12/02/2022   AST 29 12/02/2022   ALT 32 12/02/2022   PROT 7.2 12/02/2022   ALBUMIN 4.1 12/02/2022   CALCIUM 9.0 12/02/2022   ANIONGAP 10 12/02/2022   EGFR 91 07/12/2022   Lab Results  Component Value Date   CHOL 271 (H) 07/12/2022   Lab Results  Component Value Date   HDL 45 07/12/2022   Lab Results  Component Value Date   LDLCALC 205 (H) 07/12/2022   Lab Results  Component Value Date   TRIG 115 07/12/2022   Lab Results  Component Value Date   CHOLHDL 6.0 (H) 07/12/2022   Lab Results  Component Value Date   HGBA1C 6.1 (H) 07/12/2022      Assessment & Plan:  Left-sided thoracic back pain, unspecified chronicity Assessment & Plan: The patient is encouraged to continue taking Flexeril and gabapentin as prescribed. A referral has been placed to physical therapy and orthopedic surgery for collaborative care. An imaging study of the right spine is pending. The results of the lumbar  imaging study were reviewed with the patient, and she was informed that the results were negative. The patient is encouraged to perform stretching and strengthening exercises for the lower back, apply heat to the affected area, and avoid aggravating activities.    Orders: -     DG Thoracic Spine 2 View -     Ambulatory referral to Orthopedics -     Ambulatory referral to Physical Therapy  Morbid obesity [facility]) Assessment & Plan: The patient is encouraged to  continue implementing lifestyle changes for at least 3 months. She admits that she would not be able to give herself Wegovy injections monthly.  Healthy Tips for Weight Loss were reviewed, and the following recommendations were made for weight loss management:  Recommendations for Weight Loss Management: Emphasize Lifestyle Changes: A heart-healthy diet and increased physical activity are crucial for success. Healthy Tips for Weight Loss: Increase Intake of Nutrient-Rich Foods: Prioritize fruits, vegetables, and whole grains. Incorporate Lean Proteins: Include chicken, fish, beans, and legumes in your diet. Choose Low-Fat Dairy Products: Opt for low-fat versions of dairy products. Reduce Unhealthy Fats: Limit intake of saturated fats, trans fats, and cholesterol. Aim for Regular Physical Activity: Engage in at least 30 minutes of brisk walking or other physical activities on at least 5 days a week.     Note: This chart has been completed using Engineer, civil (consulting) software, and while attempts have been made to ensure accuracy, certain words and phrases may not be transcribed as intended.   Follow-up: No follow-ups on file.   Gilmore Laroche, FNP

## 2022-12-17 NOTE — Assessment & Plan Note (Signed)
The patient is encouraged to continue taking Flexeril and gabapentin as prescribed. A referral has been placed to physical therapy and orthopedic surgery for collaborative care. An imaging study of the right spine is pending. The results of the lumbar imaging study were reviewed with the patient, and she was informed that the results were negative. The patient is encouraged to perform stretching and strengthening exercises for the lower back, apply heat to the affected area, and avoid aggravating activities.

## 2022-12-17 NOTE — Patient Instructions (Addendum)
I appreciate the opportunity to provide care to you today!    Follow up:  4 months  Labs: please stop by the lab during the week to get your blood drawn (CBC, CMP, TSH, Lipid profile, HgA1c, Vit D)  -continue taking flexeril -continue taking gabapentin Heat therapy --  helps reduce muscle spasm Cold Therapy- helps reduce edema   Recommendations for Weight Loss Management:  Emphasize Lifestyle Changes: A heart-healthy diet and increased physical activity are crucial. Healthy Tips for Weight Loss: Increase Intake of Nutrient-Rich Foods: Prioritize fruits, vegetables, and whole grains. Incorporate Lean Proteins: Include chicken, fish, beans, and legumes in your diet. Choose Low-Fat Dairy Products: Opt for dairy products that are low in fat. Reduce Unhealthy Fats: Limit saturated fats, trans fatty acids, and cholesterol. Aim for Regular Physical Activity: Engage in at least 30 minutes of brisk walking or other physical activities on at least 5 days a week.  Referrals today-  orthopedic surgery/ Physical therapy   Attached with your AVS, you will find valuable resources for self-education. I highly recommend dedicating some time to thoroughly examine them.   Please continue to a heart-healthy diet and increase your physical activities. Try to exercise for at least five days a week.    It was a pleasure to see you and I look forward to continuing to work together on your health and well-being. Please do not hesitate to call the office if you need care or have questions about your care.  In case of emergency, please visit the Emergency Department for urgent care, or contact our clinic at [phone] to schedule an appointment. We're here to help you!   Have a wonderful day and week. With Gratitude, Gilmore Laroche MSN, FNP-BC '

## 2022-12-18 ENCOUNTER — Ambulatory Visit: Payer: No Typology Code available for payment source | Admitting: Orthopedic Surgery

## 2022-12-18 ENCOUNTER — Encounter: Payer: Self-pay | Admitting: Orthopedic Surgery

## 2022-12-18 VITALS — BP 148/89 | HR 71 | Ht 64.0 in | Wt 233.0 lb

## 2022-12-18 DIAGNOSIS — M546 Pain in thoracic spine: Secondary | ICD-10-CM | POA: Diagnosis not present

## 2022-12-18 NOTE — Progress Notes (Signed)
Orthopaedic Clinic Return  Assessment: Breanna Malone is a 42 y.o. adult with the following: Left thoracic back pain and left-sided flank pain  Plan: Breanna Malone has pain in the left side of her thoracic back, and into the left flank, with radiating pains into her abdomen.  She previously had surgery, almost 2 years ago.  This could represent some muscle spasm pain, but otherwise I do not see anything concerning on the radiographs or physical exam.  She can consider topical treatments such as sprays, lotions, patches and heating pad.  Continue with some exercises at home.  Consider formal physical therapy.  I also recommend that she contact her prior surgeon, to see if there is anything else affecting her in this area.  She will return to clinic as needed.  Follow-up: Return if symptoms worsen or fail to improve.   Subjective:  Chief Complaint  Patient presents with   Back Pain    Mid L side back pain for 6 mos. Pt states sthink the pain started after she received an injection in L arm. 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     History of Present Illness: Breanna Malone is a 42 y.o. adult who returns to clinic for evaluation of left thoracic back pain.  For the past few months, she states that she has been having some sharp pains in the left side of her thoracic back, radiating laterally and into the left flank in her abdomen.  She does have a history of surgery, almost 2 years ago, to remove a growth on a rib in the left side.  She had no difficulty in her recovery.  It has been a while since she saw her Careers adviser.  No issues with the incisions.  No falls or other injuries.  She was given some muscle relaxers recently, limited improvement in her symptoms.   Review of Systems: No fevers or chills No numbness or tingling No chest pain No shortness of breath No bowel or bladder dysfunction No GI distress No  headaches   Objective: BP (!) 148/89   Pulse 71   Ht 5\' 4"  (1.626 m)   Wt 233 lb (105.7 kg)   LMP 12/02/2022 (Exact Date)   BMI 39.99 kg/m   Physical Exam:  Alert and oriented.  No acute distress.  Ambulating without difficulty  On the left flank, there is a surgical incision that is well-healed.  No surrounding erythema or drainage.  She does have some tenderness on the left side of her thoracic vertebra, as well as the left side of her lower back.  She has good range of motion in her upper extremities.  No numbness or tingling distally  IMAGING: I personally ordered and reviewed the following images:  X-rays of the thoracic spine were previously obtained.  No acute injuries.  No obvious fractures or dislocations.   Oliver Barre, MD 12/18/2022 11:25 AM

## 2022-12-18 NOTE — Patient Instructions (Signed)
Recommend you contact the surgeon who did your previous surgery  Continue topical treatment such as creams, sprays, lotions and/or patches.  Home exercises or physical therapy

## 2022-12-25 ENCOUNTER — Ambulatory Visit (HOSPITAL_COMMUNITY): Payer: No Typology Code available for payment source | Attending: Family Medicine

## 2022-12-31 ENCOUNTER — Ambulatory Visit (HOSPITAL_COMMUNITY): Payer: Medicaid Other | Admitting: Psychiatry

## 2022-12-31 ENCOUNTER — Other Ambulatory Visit: Payer: Self-pay

## 2023-01-01 ENCOUNTER — Telehealth (HOSPITAL_COMMUNITY): Payer: Self-pay

## 2023-01-01 NOTE — Telephone Encounter (Signed)
01/03/23 appt by pt

## 2023-01-03 ENCOUNTER — Ambulatory Visit (HOSPITAL_COMMUNITY): Payer: Medicaid Other | Admitting: Psychiatry

## 2023-01-14 ENCOUNTER — Other Ambulatory Visit: Payer: Self-pay | Admitting: Family Medicine

## 2023-01-15 ENCOUNTER — Telehealth: Payer: Self-pay

## 2023-01-15 NOTE — Telephone Encounter (Signed)
Please advice  

## 2023-01-16 NOTE — Progress Notes (Signed)
Please inform the patient that the x-ray of her thoracic spine was negative. This means that no abnormal findings or issues were detected in the x-ray.

## 2023-01-22 ENCOUNTER — Ambulatory Visit (INDEPENDENT_AMBULATORY_CARE_PROVIDER_SITE_OTHER): Payer: No Typology Code available for payment source | Admitting: Family Medicine

## 2023-01-22 ENCOUNTER — Encounter: Payer: Self-pay | Admitting: Family Medicine

## 2023-01-22 VITALS — BP 142/88 | HR 87 | Ht 64.0 in | Wt 238.0 lb

## 2023-01-22 DIAGNOSIS — J040 Acute laryngitis: Secondary | ICD-10-CM | POA: Diagnosis not present

## 2023-01-22 DIAGNOSIS — F339 Major depressive disorder, recurrent, unspecified: Secondary | ICD-10-CM

## 2023-01-22 DIAGNOSIS — J4520 Mild intermittent asthma, uncomplicated: Secondary | ICD-10-CM

## 2023-01-22 DIAGNOSIS — I10 Essential (primary) hypertension: Secondary | ICD-10-CM | POA: Diagnosis not present

## 2023-01-22 MED ORDER — OLMESARTAN MEDOXOMIL 20 MG PO TABS
20.0000 mg | ORAL_TABLET | Freq: Every day | ORAL | 1 refills | Status: DC
Start: 2023-01-22 — End: 2023-01-24

## 2023-01-22 MED ORDER — ALBUTEROL SULFATE HFA 108 (90 BASE) MCG/ACT IN AERS
INHALATION_SPRAY | RESPIRATORY_TRACT | 0 refills | Status: DC
Start: 2023-01-22 — End: 2023-02-25

## 2023-01-22 MED ORDER — BUPROPION HCL ER (XL) 150 MG PO TB24: 150.0000 mg | ORAL_TABLET | Freq: Every day | ORAL | 1 refills | Status: DC

## 2023-01-22 NOTE — Assessment & Plan Note (Signed)
BP is currently uncontrolled, as she reports being out of her medication for a few weeks. She is asymptomatic during the clinic visit. Therapy with olmesartan 20 mg daily will be reinstated, with an emphasis on the importance of treatment adherence. She was also encouraged to follow a low-sodium diet and increase physical activity to support blood pressure management.

## 2023-01-22 NOTE — Patient Instructions (Addendum)
I appreciate the opportunity to provide care to you today!   Acute laryngitis treatment For acute laryngitis treatment, I recommend resting your voice by avoiding talking as much as possible to allow the vocal cords to heal. It's important to stay well-hydrated by drinking plenty of water to keep the vocal cords moist. I also recommend consuming warm, non-caffeinated beverages, such as herbal tea with honey, to soothe your throat. Using a humidifier or inhaling steam can help prevent dryness and irritation of the throat. Symptoms of acute laryngitis are typically self-limiting and resolve within a few days to two weeks. Please follow up if your symptoms persist for more than two weeks or if you experience any of the following: difficulty swallowing, severe pain, difficulty breathing, a lump in the throat or neck, or unexplained weight loss.  Please pick up your refills at the pharmacy, and I wish you a speedy recovery!  It was a pleasure to see you and I look forward to continuing to work together on your health and well-being. Please do not hesitate to call the office if you need care or have questions about your care.  In case of emergency, please visit the Emergency Department for urgent care, or contact our clinic at [phone] to schedule an appointment. We're here to help you!   Have a wonderful day and week. With Gratitude, Gilmore Laroche MSN, FNP-BC

## 2023-01-22 NOTE — Assessment & Plan Note (Signed)
-   Refilled albuterol inhaler 

## 2023-01-22 NOTE — Assessment & Plan Note (Signed)
Denies suicidal thoughts and ideation Refill Wellbutrin 150 mg to take daily

## 2023-01-22 NOTE — Assessment & Plan Note (Signed)
I recommend resting herr voice by avoiding talking as much as possible to allow the vocal cords to heal. It's important to stay well-hydrated by drinking plenty of water to keep the vocal cords moist. I also recommend consuming warm, non-caffeinated beverages, such as herbal tea with honey, to soothe your throat. Using a humidifier or inhaling steam can help prevent dryness and irritation of the throat. Symptoms of acute laryngitis are typically self-limiting and resolve within a few days to two weeks. Please follow up if your symptoms persist for more than two weeks or if you experience any of the following: difficulty swallowing, severe pain, difficulty breathing, a lump in the throat or neck, or unexplained weight loss.

## 2023-01-22 NOTE — Progress Notes (Signed)
Established Patient Office Visit  Subjective:  Patient ID: Breanna Malone, adult    DOB: [birth year]  Age: 42 y.o. MRN: [id number]  CC:  Chief Complaint  Patient presents with   Hoarse    Pt reports feeling hoarse started last night.     HPI Breanna Malone is a 42 y.o. presents with symptoms of hoarseness since 01/21/2023. She reports sleeping underneath a fan and waking up with hoarseness in her voice.  No fever, chills, facial pain or pressure, nausea vomiting diarrhea or generalized bodyaches reported .she denies difficulty swallowing or breathing but notes that two days ago, she experienced excessive coughing and shortness of breath while coughing. She has been unable to refill her albuterol inhaler due to the prescription having no refills. Additionally, she needs refills today for her blood pressure medication, as she has run out.  Past Medical History:  Diagnosis Date   Abnormal Pap smear    Arthritis    Asthma    Asthma    Bipolar 1 disorder (HCC)    Bone spur    rib   Bronchitis    BV (bacterial vaginosis) 10/08/2019   8/19 rx flagyl   Carpal tunnel syndrome    Cervical spine pain    Chest pain    Diabetes mellitus without complication (HCC)    Elevated cholesterol    High cholesterol    Hyperlipidemia    Hyperlipoproteinemia    Hypertension    Post traumatic stress disorder    Right ovarian cyst 10/31/2012   Complex right ovarian cyst seen 8/18 in ER need f/u US   Tendonitis    Vaginal Pap smear, abnormal     Past Surgical History:  Procedure Laterality Date   CARPAL TUNNEL RELEASE Right 06/28/2016   Procedure: RIGHT CARPAL TUNNEL RELEASE;  Surgeon: Vickki Hearing, MD;  Location: [facility];  Service: Orthopedics;  Laterality: Right;   HERNIA REPAIR     umbilical   RIB RESECTION Left 03/16/2021   Procedure: RESECTION OF TUMOR LEFT NINTH RIB;  Surgeon: Loreli Slot, MD;  Location: [facility] OR;  Service: Thoracic;  Laterality: Left;    Family History   Problem Relation Age of Onset   Diabetes Mother    Hypertension Mother    Asthma Father    Bronchitis Daughter    Asthma Daughter    Asthma Daughter    Bronchitis Daughter    Asthma Son    Bronchitis Son    Asthma Son    Stroke Paternal Grandmother    Heart attack Paternal Grandmother    Cancer Paternal Grandmother    Cancer Cousin    Asthma Other    Bronchitis Other     Social History   Socioeconomic History   Marital status: Widowed    Spouse name: Not on file   Number of children: Not on file   Years of education: Not on file   Highest education level: Not on file  Occupational History   Not on file  Tobacco Use   Smoking status: Never   Smokeless tobacco: Never  Vaping Use   Vaping status: Never Used  Substance and Sexual Activity   Alcohol use: No   Drug use: No   Sexual activity: Yes    Birth control/protection: Condom  Other Topics Concern   Not on file  Social History Narrative   Not on file   Social Determinants of Health   Financial Resource Strain: Low Risk  (07/27/2020)  Overall Financial Resource Strain (CARDIA)    Difficulty of Paying Living Expenses: Not hard at all  Food Insecurity: No Food Insecurity (07/27/2020)   Hunger Vital Sign    Worried About Running Out of Food in the Last Year: Never true    Ran Out of Food in the Last Year: Never true  Transportation Needs: Unmet Transportation Needs (07/27/2020)   PRAPARE - Transportation    Lack of Transportation (Medical): No    Lack of Transportation (Non-Medical): Yes  Physical Activity: Insufficiently Active (07/27/2020)   Exercise Vital Sign    Days of Exercise per Week: 2 days    Minutes of Exercise per Session: 30 min  Stress: No Stress Concern Present (07/27/2020)   [employer] of Occupational Health - Occupational Stress Questionnaire    Feeling of Stress : Not at all  Social Connections: Moderately Isolated (07/27/2020)   Social Connection and Isolation Panel [NHANES]    Frequency  of Communication with Friends and Family: Three times a week    Frequency of Social Gatherings with Friends and Family: Three times a week    Attends Religious Services: 1 to 4 times per year    Active Member of Clubs or Organizations: No    Attends Banker Meetings: Never    Marital Status: Widowed  Intimate Partner Violence: Not At Risk (07/27/2020)   Humiliation, Afraid, Rape, and Kick questionnaire    Fear of Current or Ex-Partner: No    Emotionally Abused: No    Physically Abused: No    Sexually Abused: No    Outpatient Medications Prior to Visit  Medication Sig Dispense Refill   cyclobenzaprine (FLEXERIL) 5 MG tablet Take 1 tablet (5 mg total) by mouth at bedtime as needed for muscle spasms. 30 tablet 1   gabapentin (NEURONTIN) 300 MG capsule Take 1 capsule (300 mg total) by mouth 3 (three) times daily as needed. 30 capsule 0   medroxyPROGESTERone Acetate 150 MG/ML SUSY INJECT 1 ML (150MG ) INTRAMUSCULARLY EVERY 3 MONTHS. 1 mL 0   Multiple Vitamin (MULTIVITAMIN) capsule Take 1 capsule by mouth daily.     ondansetron (ZOFRAN-ODT) 4 MG disintegrating tablet 4mg  ODT q4 hours prn nausea/vomit 10 tablet 0   rosuvastatin (CRESTOR) 40 MG tablet Take 1 tablet (40 mg total) by mouth daily. 90 tablet 3   UNABLE TO FIND RES-Q 105 max 1 daily.     albuterol (VENTOLIN HFA) 108 (90 Base) MCG/ACT inhaler INHALE 1 OR 2 PUFFS INTO THE LUNGS EVERY 6 HOURS AS NEEDED FOR WHEEZING. 8.5 g 0   buPROPion (WELLBUTRIN XL) 150 MG 24 hr tablet Take 1 tablet (150 mg total) by mouth daily. 30 tablet 0   olmesartan (BENICAR) 20 MG tablet TAKE 1 TABLET(20 MG) BY MOUTH DAILY 90 tablet 1   No facility-administered medications prior to visit.    Allergies  Allergen Reactions   Darvocet [Propoxyphene N-Acetaminophen] Itching   Hydrocodone-Acetaminophen Itching   Lactose Intolerance (Gi) Other (See Comments)    Causes stomach cramping.   Other Hives    Patient is allergic to corndogs.   Penicillins  Hives and Itching    Has patient had a PCN reaction causing immediate rash, facial/tongue/throat swelling, SOB or lightheadedness with hypotension:Yes Has patient had a PCN reaction causing severe rash involving mucus membranes or skin necrosis:No Has patient had a PCN reaction that required hospitalization:No Has patient had a PCN reaction occurring within the last 10 years:No If all of the above answers are "NO", then may  proceed with Cephalosporin use.    Latex Itching and Rash   Percocet [Oxycodone-Acetaminophen] Itching and Rash    ROS Review of Systems  Constitutional:  Negative for chills and fever.  HENT:  Negative for congestion, rhinorrhea, sinus pressure, sinus pain and sore throat.   Eyes:  Negative for visual disturbance.  Respiratory:  Negative for chest tightness and shortness of breath.   Neurological:  Negative for dizziness and headaches.      Objective:    Physical Exam HENT:     Head: Normocephalic.     Mouth/Throat:     Mouth: Mucous membranes are moist.     Tongue: No lesions.     Palate: No mass and lesions.     Pharynx: No pharyngeal swelling or posterior oropharyngeal erythema.     Tonsils: No tonsillar exudate.  Cardiovascular:     Rate and Rhythm: Normal rate.     Heart sounds: Normal heart sounds.  Pulmonary:     Effort: Pulmonary effort is normal.     Breath sounds: Normal breath sounds.  Neurological:     Mental Status: She is alert.     BP (!) 142/88 (BP Location: Left Arm)   Pulse 87   Ht 5\' 4"  (1.626 m)   Wt 238 lb 0.6 oz (108 kg)   SpO2 95%   BMI 40.86 kg/m  Wt Readings from Last 3 Encounters:  01/22/23 238 lb 0.6 oz (108 kg)  12/18/22 233 lb (105.7 kg)  12/17/22 236 lb (107 kg)    Lab Results  Component Value Date   TSH 0.756 07/12/2022   Lab Results  Component Value Date   WBC 8.9 12/02/2022   HGB 12.4 12/02/2022   HCT 38.1 12/02/2022   MCV 95.7 12/02/2022   PLT 316 12/02/2022   Lab Results  Component Value Date    NA 137 12/02/2022   K 3.3 (L) 12/02/2022   CO2 26 12/02/2022   GLUCOSE 151 (H) 12/02/2022   BUN 6 12/02/2022   CREATININE 0.85 12/02/2022   BILITOT 0.4 12/02/2022   ALKPHOS 68 12/02/2022   AST 29 12/02/2022   ALT 32 12/02/2022   PROT 7.2 12/02/2022   ALBUMIN 4.1 12/02/2022   CALCIUM 9.0 12/02/2022   ANIONGAP 10 12/02/2022   EGFR 91 07/12/2022   Lab Results  Component Value Date   CHOL 271 (H) 07/12/2022   Lab Results  Component Value Date   HDL 45 07/12/2022   Lab Results  Component Value Date   LDLCALC 205 (H) 07/12/2022   Lab Results  Component Value Date   TRIG 115 07/12/2022   Lab Results  Component Value Date   CHOLHDL 6.0 (H) 07/12/2022   Lab Results  Component Value Date   HGBA1C 6.1 (H) 07/12/2022      Assessment & Plan:  Acute laryngitis Assessment & Plan: I recommend resting herr voice by avoiding talking as much as possible to allow the vocal cords to heal. It's important to stay well-hydrated by drinking plenty of water to keep the vocal cords moist. I also recommend consuming warm, non-caffeinated beverages, such as herbal tea with honey, to soothe your throat. Using a humidifier or inhaling steam can help prevent dryness and irritation of the throat. Symptoms of acute laryngitis are typically self-limiting and resolve within a few days to two weeks. Please follow up if your symptoms persist for more than two weeks or if you experience any of the following: difficulty swallowing, severe pain, difficulty breathing, a  lump in the throat or neck, or unexplained weight loss.    Primary hypertension Assessment & Plan: BP is currently uncontrolled, as she reports being out of her medication for a few weeks. She is asymptomatic during the clinic visit. Therapy with olmesartan 20 mg daily will be reinstated, with an emphasis on the importance of treatment adherence. She was also encouraged to follow a low-sodium diet and increase physical activity to support  blood pressure management.   Orders: -     Olmesartan Medoxomil; Take 1 tablet (20 mg total) by mouth daily.  Dispense: 90 tablet; Refill: 1  Mild intermittent asthma, unspecified whether complicated Assessment & Plan: Refilled albuterol inhaler  Orders: -     Albuterol Sulfate HFA; INHALE 1 OR 2 PUFFS INTO THE LUNGS EVERY 6 HOURS AS NEEDED FOR WHEEZING.  Dispense: 8.5 g; Refill: 0  Episode of recurrent major depressive disorder, unspecified depression episode severity (HCC) Assessment & Plan: Denies suicidal thoughts and ideation Refill Wellbutrin 150 mg to take daily  Orders: -     buPROPion HCl ER (XL); Take 1 tablet (150 mg total) by mouth daily.  Dispense: 90 tablet; Refill: 1  Note: This chart has been completed using Engineer, civil (consulting) software, and while attempts have been made to ensure accuracy, certain words and phrases may not be transcribed as intended.    Follow-up: No follow-ups on file.   Gilmore Laroche, FNP

## 2023-01-24 ENCOUNTER — Other Ambulatory Visit: Payer: Self-pay

## 2023-01-24 ENCOUNTER — Telehealth: Payer: Self-pay

## 2023-01-24 DIAGNOSIS — E7849 Other hyperlipidemia: Secondary | ICD-10-CM

## 2023-01-24 DIAGNOSIS — I10 Essential (primary) hypertension: Secondary | ICD-10-CM

## 2023-01-24 MED ORDER — ROSUVASTATIN CALCIUM 40 MG PO TABS: 40.0000 mg | ORAL_TABLET | Freq: Every day | ORAL | 3 refills | Status: DC

## 2023-01-24 MED ORDER — OLMESARTAN MEDOXOMIL 20 MG PO TABS
20.0000 mg | ORAL_TABLET | Freq: Every day | ORAL | 1 refills | Status: DC
Start: 2023-01-24 — End: 2023-02-25

## 2023-01-24 NOTE — Telephone Encounter (Signed)
Pt reports ear and throat killing her she needs an antibiotic.

## 2023-01-24 NOTE — Telephone Encounter (Signed)
Patient calling back asking for the antibiotic to be sent into her pharmacy today was just seen yesterday and no antibiotic was sent in to her pharmacy :

## 2023-01-24 NOTE — Telephone Encounter (Signed)
Copied from CRM [id number]. Topic: Clinical - Medication Question >> Jan 24, 2023  1:20 PM Donita Brooks wrote: Reason for CRM: pt is stating that she needs some antibiotics bad. med is not pt med list, pt mention that Dr. Malachi Bonds knows what med to give her

## 2023-01-24 NOTE — Telephone Encounter (Signed)
Antibiotic note indicated

## 2023-01-25 ENCOUNTER — Encounter (HOSPITAL_COMMUNITY): Payer: Self-pay

## 2023-01-25 ENCOUNTER — Emergency Department (HOSPITAL_COMMUNITY)
Admission: EM | Admit: 2023-01-25 | Discharge: 2023-01-26 | Disposition: A | Discharge status: Home / Self Care | Payer: No Typology Code available for payment source | Attending: Emergency Medicine | Admitting: Emergency Medicine

## 2023-01-25 ENCOUNTER — Other Ambulatory Visit: Payer: Self-pay

## 2023-01-25 DIAGNOSIS — R0789 Other chest pain: Secondary | ICD-10-CM | POA: Diagnosis not present

## 2023-01-25 DIAGNOSIS — J029 Acute pharyngitis, unspecified: Secondary | ICD-10-CM | POA: Diagnosis not present

## 2023-01-25 DIAGNOSIS — E119 Type 2 diabetes mellitus without complications: Secondary | ICD-10-CM | POA: Diagnosis not present

## 2023-01-25 DIAGNOSIS — R052 Subacute cough: Secondary | ICD-10-CM | POA: Insufficient documentation

## 2023-01-25 DIAGNOSIS — R0989 Other specified symptoms and signs involving the circulatory and respiratory systems: Secondary | ICD-10-CM | POA: Diagnosis not present

## 2023-01-25 DIAGNOSIS — Z20822 Contact with and (suspected) exposure to covid-19: Secondary | ICD-10-CM | POA: Insufficient documentation

## 2023-01-25 DIAGNOSIS — R059 Cough, unspecified: Secondary | ICD-10-CM | POA: Diagnosis not present

## 2023-01-25 DIAGNOSIS — Z79899 Other long term (current) drug therapy: Secondary | ICD-10-CM | POA: Diagnosis not present

## 2023-01-25 DIAGNOSIS — Z91040 Latex allergy status: Secondary | ICD-10-CM | POA: Insufficient documentation

## 2023-01-25 DIAGNOSIS — H9201 Otalgia, right ear: Secondary | ICD-10-CM | POA: Insufficient documentation

## 2023-01-25 DIAGNOSIS — I10 Essential (primary) hypertension: Secondary | ICD-10-CM | POA: Insufficient documentation

## 2023-01-25 DIAGNOSIS — J45909 Unspecified asthma, uncomplicated: Secondary | ICD-10-CM | POA: Diagnosis not present

## 2023-01-25 DIAGNOSIS — M549 Dorsalgia, unspecified: Secondary | ICD-10-CM | POA: Insufficient documentation

## 2023-01-25 NOTE — ED Triage Notes (Signed)
Pt reports:  Cough Started 3 days ago Back Pain Started 3 days  3. Chest Discomfort  A. Started 3 days ago

## 2023-01-25 NOTE — Telephone Encounter (Signed)
Let pt know , states her ears are in so much pain and throat is hurting her a lot.

## 2023-01-26 ENCOUNTER — Emergency Department (HOSPITAL_COMMUNITY): Payer: No Typology Code available for payment source

## 2023-01-26 DIAGNOSIS — R0789 Other chest pain: Secondary | ICD-10-CM | POA: Diagnosis not present

## 2023-01-26 DIAGNOSIS — R052 Subacute cough: Secondary | ICD-10-CM | POA: Diagnosis not present

## 2023-01-26 DIAGNOSIS — R0989 Other specified symptoms and signs involving the circulatory and respiratory systems: Secondary | ICD-10-CM | POA: Diagnosis not present

## 2023-01-26 DIAGNOSIS — R059 Cough, unspecified: Secondary | ICD-10-CM | POA: Diagnosis not present

## 2023-01-26 DIAGNOSIS — M549 Dorsalgia, unspecified: Secondary | ICD-10-CM | POA: Diagnosis not present

## 2023-01-26 LAB — GROUP A STREP BY PCR: Group A Strep by PCR: NOT DETECTED

## 2023-01-26 MED ORDER — PREDNISONE 50 MG PO TABS
60.0000 mg | ORAL_TABLET | Freq: Once | ORAL | Status: AC
  Administered 2023-01-26: 60 mg via ORAL
  Filled 2023-01-26: qty 1

## 2023-01-26 MED ORDER — ALBUTEROL SULFATE (2.5 MG/3ML) 0.083% IN NEBU
INHALATION_SOLUTION | RESPIRATORY_TRACT | Status: AC
  Administered 2023-01-26: 2.5 mg
  Filled 2023-01-26: qty 3

## 2023-01-26 MED ORDER — IPRATROPIUM-ALBUTEROL 0.5-2.5 (3) MG/3ML IN SOLN
RESPIRATORY_TRACT | Status: AC
  Administered 2023-01-26: 3 mL
  Filled 2023-01-26: qty 3

## 2023-01-26 MED ORDER — IPRATROPIUM-ALBUTEROL 0.5-2.5 (3) MG/3ML IN SOLN
3.0000 mL | Freq: Once | RESPIRATORY_TRACT | Status: AC
  Administered 2023-01-26: 3 mL via RESPIRATORY_TRACT
  Filled 2023-01-26: qty 3

## 2023-01-26 MED ORDER — PREDNISONE 50 MG PO TABS: 50.0000 mg | ORAL_TABLET | Freq: Every day | ORAL | 0 refills | Status: DC

## 2023-01-26 MED ORDER — BENZONATATE 100 MG PO CAPS: 100.0000 mg | ORAL_CAPSULE | Freq: Three times a day (TID) | ORAL | 0 refills | Status: DC

## 2023-01-26 NOTE — ED Notes (Signed)
Patient verbalizes understanding of discharge instructions. Opportunity for questioning and answers were provided. Armband removed by staff, pt discharged from ED. Ambulated out to lobby  

## 2023-01-26 NOTE — ED Notes (Signed)
Patient transported to X-ray 

## 2023-01-26 NOTE — ED Provider Notes (Signed)
[place] EMERGENCY DEPARTMENT AT [facility] Provider Note   CSN: [id number] Arrival date & time: 01/25/23  2228     History  Chief Complaint  Patient presents with   Cough   Pleurisy   Back Pain    Breanna Malone is a 42 y.o. adult.  The history is provided by the patient.  Cough Back Pain She has history of hypertension, diabetes, hyperlipidemia, asthma, bipolar disorder and comes in because of ongoing problems with cough.  She has had a nonproductive cough for about the last week.  She saw her primary care provider last week and was diagnosed with laryngitis but not given any prescriptions.  She continues to complain of a cough, especially if she tries to take a deep breath.  Cough continues to be nonproductive.  She denies fever or chills.  At 1 point, she did have a sore throat and lost her voice, but that is improving.  She also complains of some pain shooting into her right ear.  She denies fever, chills, sweats.  She denies nausea, vomiting, diarrhea.  She denies arthralgias or myalgias.   Home Medications Prior to Admission medications   Medication Sig Start Date End Date Taking? Authorizing Provider  albuterol (VENTOLIN HFA) 108 (90 Base) MCG/ACT inhaler INHALE 1 OR 2 PUFFS INTO THE LUNGS EVERY 6 HOURS AS NEEDED FOR WHEEZING. 01/22/23   Gilmore Laroche, FNP  buPROPion (WELLBUTRIN XL) 150 MG 24 hr tablet Take 1 tablet (150 mg total) by mouth daily. 01/22/23   Gilmore Laroche, FNP  cyclobenzaprine (FLEXERIL) 5 MG tablet Take 1 tablet (5 mg total) by mouth at bedtime as needed for muscle spasms. 12/10/22   Gilmore Laroche, FNP  gabapentin (NEURONTIN) 300 MG capsule Take 1 capsule (300 mg total) by mouth 3 (three) times daily as needed. 12/10/22   Gilmore Laroche, FNP  medroxyPROGESTERone Acetate 150 MG/ML SUSY INJECT 1 ML (150MG ) INTRAMUSCULARLY EVERY 3 MONTHS. 09/25/22   Adline Potter, NP  Multiple Vitamin (MULTIVITAMIN) capsule Take 1 capsule by mouth daily.     [provider]  olmesartan (BENICAR) 20 MG tablet Take 1 tablet (20 mg total) by mouth daily. 01/24/23   Gilmore Laroche, FNP  ondansetron (ZOFRAN-ODT) 4 MG disintegrating tablet 4mg  ODT q4 hours prn nausea/vomit 12/10/22   Gilmore Laroche, FNP  rosuvastatin (CRESTOR) 40 MG tablet Take 1 tablet (40 mg total) by mouth daily. 01/24/23   Gilmore Laroche, FNP  UNABLE TO FIND RES-Q 105 max 1 daily.    [provider]      Allergies    Darvocet [propoxyphene n-acetaminophen], Hydrocodone-acetaminophen, Lactose intolerance (gi), Other, Penicillins, Latex, and Percocet [oxycodone-acetaminophen]    Review of Systems   Review of Systems  Respiratory:  Positive for cough.   Musculoskeletal:  Positive for back pain.  All other systems reviewed and are negative.   Physical Exam Updated Vital Signs BP (!) 138/113   Pulse 76   Temp 98.5 F (36.9 C) (Oral)   Resp 18   Ht 5\' 4"  (1.626 m)   Wt 107.5 kg   SpO2 95%   BMI 40.68 kg/m  Physical Exam Vitals and nursing note reviewed.   42 year old female, resting comfortably and in no acute distress. Vital signs are significant for elevated blood pressure. Oxygen saturation is 95%, which is normal. Head is normocephalic and atraumatic. PERRLA, EOMI. Oropharynx is clear.  Tympanic membranes are clear. Neck is nontender and supple without adenopathy. Back is nontender and there is  no CVA tenderness. Lungs are clear without rales, wheezes, or rhonchi. Chest is nontender. Heart has regular rate and rhythm without murmur. Abdomen is soft, flat, nontender. Extremities have no cyanosis or edema, full range of motion is present. Skin is warm and dry without rash. Neurologic: Mental status is normal, moves all extremities equally.  ED Results / Procedures / Treatments   Labs (all labs ordered are listed, but only abnormal results are displayed) Labs Reviewed  GROUP A STREP BY PCR    Radiology DG Chest 2 View  Result Date:  01/26/2023 CLINICAL DATA:  42 year old female with cough onset 3 days ago. Chest discomfort and back pain. EXAM: CHEST - 2 VIEW COMPARISON:  Portable chest 11/18/2022 and earlier. FINDINGS: PA and lateral views 0348 hours. Mildly lower lung volumes from some previous exams. Mediastinal contours are stable and within normal limits. Visualized tracheal air column is within normal limits. Minor left lateral lung base opacity on the PA view, posterior on the lateral appears related to chronic postoperative changes of the 9th rib demonstrated by CT last year. No consolidation or pleural effusion. Elsewhere lung markings appear stable and normal. No pneumothorax. Negative visible bowel gas. No acute osseous abnormality identified. IMPRESSION: 1.  No acute cardiopulmonary abnormality. 2. 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 Electronically Signed   By: Odessa Fleming M.D.   On: 01/26/2023 04:33    Procedures Procedures    Medications Ordered in ED Medications  ipratropium-albuterol (DUONEB) 0.5-2.5 (3) MG/3ML nebulizer solution (3 mLs  Given 01/26/23 0006)  albuterol (PROVENTIL) (2.5 MG/3ML) 0.083% nebulizer solution (2.5 mg  Given 01/26/23 0006)    ED Course/ Medical Decision Making/ A&P                                 Medical Decision Making Amount and/or Complexity of Data Reviewed Radiology: ordered.  Risk Prescription drug management.   I have persistent cough with benign exam.  Consider occult pneumonia, asthma exacerbation, viral bronchitis.  Episode of pharyngitis and sore throat, probable viral illness but consider streptococcal infection.  She had nebulizer treatment albuterol and ipratropium ordered at triage and states that she actually feels significantly better following that, I have ordered an additional nebulizer treatment as well as dose of oral prednisone.  I have ordered chest x-ray to rule out pneumonia, strep PCR to rule out streptococcal infection.  I reviewed her past  records and note office visit on 01/22/2023 for acute laryngitis, no testing done at that time.  I have reviewed her laboratory test, my interpretation is no evidence of strep infection.  Chest x-ray shows no acute cardiopulmonary abnormality, chronic postoperative changes of the ninth rib.  I have independently viewed the images, and agree with the radiologist's interpretation.  She did note additional improvement following second nebulizer treatment.  I am discharging her with prescription for short course of prednisone as well as a prescription for benzonatate.  I have advised her to continue using her albuterol inhaler at home as needed.  Return if symptoms are worsening.  Final Clinical Impression(s) / ED Diagnoses Final diagnoses:  Subacute cough    Rx / DC Orders ED Discharge Orders          Ordered    predniSONE (DELTASONE) 50 MG tablet  Daily        01/26/23 0509    benzonatate (TESSALON) 100 MG capsule  Every 8 hours  01/26/23 1610              Dione Booze, MD 01/26/23 [phone]

## 2023-01-26 NOTE — Discharge Instructions (Signed)
Use your inhaler every 4 hours as needed to help with cough and breathing.  Return if you have any new or concerning symptoms.

## 2023-02-25 ENCOUNTER — Telehealth: Payer: Self-pay | Admitting: Family Medicine

## 2023-02-25 ENCOUNTER — Other Ambulatory Visit: Payer: Self-pay

## 2023-02-25 DIAGNOSIS — J4520 Mild intermittent asthma, uncomplicated: Secondary | ICD-10-CM

## 2023-02-25 DIAGNOSIS — I10 Essential (primary) hypertension: Secondary | ICD-10-CM

## 2023-02-25 DIAGNOSIS — E7849 Other hyperlipidemia: Secondary | ICD-10-CM

## 2023-02-25 DIAGNOSIS — M545 Low back pain, unspecified: Secondary | ICD-10-CM

## 2023-02-25 DIAGNOSIS — F339 Major depressive disorder, recurrent, unspecified: Secondary | ICD-10-CM

## 2023-02-25 MED ORDER — BUPROPION HCL ER (XL) 150 MG PO TB24: 150.0000 mg | ORAL_TABLET | Freq: Every day | ORAL | 1 refills | Status: AC

## 2023-02-25 MED ORDER — CYCLOBENZAPRINE HCL 5 MG PO TABS: 5.0000 mg | ORAL_TABLET | Freq: Every evening | ORAL | 1 refills | Status: DC | PRN

## 2023-02-25 MED ORDER — ALBUTEROL SULFATE HFA 108 (90 BASE) MCG/ACT IN AERS: INHALATION_SPRAY | RESPIRATORY_TRACT | 0 refills | Status: DC

## 2023-02-25 MED ORDER — GABAPENTIN 300 MG PO CAPS: 300.0000 mg | ORAL_CAPSULE | Freq: Three times a day (TID) | ORAL | 0 refills | Status: DC | PRN

## 2023-02-25 MED ORDER — OLMESARTAN MEDOXOMIL 20 MG PO TABS: 20.0000 mg | ORAL_TABLET | Freq: Every day | ORAL | 1 refills | Status: AC

## 2023-02-25 MED ORDER — ROSUVASTATIN CALCIUM 40 MG PO TABS: 40.0000 mg | ORAL_TABLET | Freq: Every day | ORAL | 3 refills | Status: AC

## 2023-02-25 MED ORDER — ONDANSETRON 4 MG PO TBDP: ORAL_TABLET | ORAL | 0 refills | Status: DC

## 2023-02-25 NOTE — Telephone Encounter (Signed)
 Refills have been sent.

## 2023-02-25 NOTE — Telephone Encounter (Signed)
 Prescription Request  02/25/2023  LOV: 01/22/2023  What is the name of the medication or equipment? albuterol  (VENTOLIN  HFA) 108 (90 Base) MCG/ACT inhaler [[id number]]  buPROPion  (WELLBUTRIN  XL) 150 MG 24 hr tablet [[id number]]  cyclobenzaprine  (FLEXERIL ) 5 MG tablet [[id number]]  gabapentin  (NEURONTIN ) 300 MG capsule [[id number]]  olmesartan  (BENICAR ) 20 MG tablet [[id number]]  ondansetron  (ZOFRAN -ODT) 4 MG disintegrating tablet [[id number]]  rosuvastatin  (CRESTOR ) 40 MG tablet [[id number]]    Have you contacted your pharmacy to request a refill? Yes   Which pharmacy would you like this sent to?    [id number] - [place],  - [address] [place] [address] Phone: [phone] Fax: [phone]    Patient notified that their request is being sent to the clinical staff for review and that they should receive a response within 2 business days.   Please advise at Mobile [phone] (mobile)

## 2023-04-08 ENCOUNTER — Encounter: Payer: Self-pay | Admitting: Family Medicine

## 2023-04-08 ENCOUNTER — Ambulatory Visit: Payer: Medicaid Other | Admitting: Family Medicine

## 2023-04-08 VITALS — BP 133/82 | HR 86 | Ht 64.0 in | Wt 234.0 lb

## 2023-04-08 DIAGNOSIS — E782 Mixed hyperlipidemia: Secondary | ICD-10-CM

## 2023-04-08 DIAGNOSIS — I10 Essential (primary) hypertension: Secondary | ICD-10-CM | POA: Diagnosis not present

## 2023-04-08 NOTE — Progress Notes (Unsigned)
Established Patient Office Visit  Subjective:  Patient ID: Breanna Malone, adult    DOB: 1980/06/14  Age: 43 y.o. MRN: [id number]  CC:  Chief Complaint  Patient presents with   Follow-up    Follow up headache    HPI Breanna Malone is a 43 y.o. adult with past medical history of hypertension, hyperlipidemia presents for f/u of  chronic medical conditions. For the details of today's visit, please refer to the assessment and plan.     Past Medical History:  Diagnosis Date   Abnormal Pap smear    Arthritis    Asthma    Asthma    Bipolar 1 disorder (HCC)    Bone spur    rib   Bronchitis    BV (bacterial vaginosis) 10/08/2019   8/19 rx flagyl   Carpal tunnel syndrome    Cervical spine pain    Chest pain    Diabetes mellitus without complication (HCC)    Elevated cholesterol    High cholesterol    Hyperlipidemia    Hyperlipoproteinemia    Hypertension    Post traumatic stress disorder    Right ovarian cyst 10/31/2012   Complex right ovarian cyst seen 8/18 in ER need f/u US   Tendonitis    Vaginal Pap smear, abnormal     Past Surgical History:  Procedure Laterality Date   CARPAL TUNNEL RELEASE Right 06/28/2016   Procedure: RIGHT CARPAL TUNNEL RELEASE;  Surgeon: Vickki Hearing, MD;  Location: [facility];  Service: Orthopedics;  Laterality: Right;   HERNIA REPAIR     umbilical   RIB RESECTION Left 03/16/2021   Procedure: RESECTION OF TUMOR LEFT NINTH RIB;  Surgeon: Loreli Slot, MD;  Location: [facility] OR;  Service: Thoracic;  Laterality: Left;    Family History  Problem Relation Age of Onset   Diabetes Mother    Hypertension Mother    Asthma Father    Bronchitis Daughter    Asthma Daughter    Asthma Daughter    Bronchitis Daughter    Asthma Son    Bronchitis Son    Asthma Son    Stroke Paternal Grandmother    Heart attack Paternal Grandmother    Cancer Paternal Grandmother    Cancer Cousin    Asthma Other    Bronchitis Other     Social History    Socioeconomic History   Marital status: Widowed    Spouse name: Not on file   Number of children: Not on file   Years of education: Not on file   Highest education level: Not on file  Occupational History   Not on file  Tobacco Use   Smoking status: Never   Smokeless tobacco: Never  Vaping Use   Vaping status: Never Used  Substance and Sexual Activity   Alcohol use: No   Drug use: No   Sexual activity: Yes    Birth control/protection: Condom  Other Topics Concern   Not on file  Social History Narrative   Not on file   Social Drivers of Health   Financial Resource Strain: Low Risk  (07/27/2020)   Overall Financial Resource Strain (CARDIA)    Difficulty of Paying Living Expenses: Not hard at all  Food Insecurity: No Food Insecurity (07/27/2020)   Hunger Vital Sign    Worried About Running Out of Food in the Last Year: Never true    Ran Out of Food in the Last Year: Never true  Transportation Needs: Unmet Transportation  Needs (07/27/2020)   PRAPARE - Transportation    Lack of Transportation (Medical): No    Lack of Transportation (Non-Medical): Yes  Physical Activity: Insufficiently Active (07/27/2020)   Exercise Vital Sign    Days of Exercise per Week: 2 days    Minutes of Exercise per Session: 30 min  Stress: No Stress Concern Present (07/27/2020)   [employer] of Occupational Health - Occupational Stress Questionnaire    Feeling of Stress : Not at all  Social Connections: Moderately Isolated (07/27/2020)   Social Connection and Isolation Panel [NHANES]    Frequency of Communication with Friends and Family: Three times a week    Frequency of Social Gatherings with Friends and Family: Three times a week    Attends Religious Services: 1 to 4 times per year    Active Member of Clubs or Organizations: No    Attends Banker Meetings: Never    Marital Status: Widowed  Intimate Partner Violence: Not At Risk (07/27/2020)   Humiliation, Afraid, Rape, and Kick  questionnaire    Fear of Current or Ex-Partner: No    Emotionally Abused: No    Physically Abused: No    Sexually Abused: No    Outpatient Medications Prior to Visit  Medication Sig Dispense Refill   albuterol (VENTOLIN HFA) 108 (90 Base) MCG/ACT inhaler INHALE 1 OR 2 PUFFS INTO THE LUNGS EVERY 6 HOURS AS NEEDED FOR WHEEZING. 8.5 g 0   benzonatate (TESSALON) 100 MG capsule Take 1 capsule (100 mg total) by mouth every 8 (eight) hours. 21 capsule 0   buPROPion (WELLBUTRIN XL) 150 MG 24 hr tablet Take 1 tablet (150 mg total) by mouth daily. 90 tablet 1   cyclobenzaprine (FLEXERIL) 5 MG tablet Take 1 tablet (5 mg total) by mouth at bedtime as needed for muscle spasms. 30 tablet 1   gabapentin (NEURONTIN) 300 MG capsule Take 1 capsule (300 mg total) by mouth 3 (three) times daily as needed. 30 capsule 0   medroxyPROGESTERone Acetate 150 MG/ML SUSY INJECT 1 ML (150MG ) INTRAMUSCULARLY EVERY 3 MONTHS. 1 mL 0   Multiple Vitamin (MULTIVITAMIN) capsule Take 1 capsule by mouth daily.     olmesartan (BENICAR) 20 MG tablet Take 1 tablet (20 mg total) by mouth daily. 90 tablet 1   ondansetron (ZOFRAN-ODT) 4 MG disintegrating tablet 4mg  ODT q4 hours prn nausea/vomit 10 tablet 0   predniSONE (DELTASONE) 50 MG tablet Take 1 tablet (50 mg total) by mouth daily. 5 tablet 0   rosuvastatin (CRESTOR) 40 MG tablet Take 1 tablet (40 mg total) by mouth daily. 90 tablet 3   UNABLE TO FIND RES-Q 105 max 1 daily.     No facility-administered medications prior to visit.    Allergies  Allergen Reactions   Darvocet [Propoxyphene N-Acetaminophen] Itching   Hydrocodone-Acetaminophen Itching   Lactose Intolerance (Gi) Other (See Comments)    Causes stomach cramping.   Other Hives    Patient is allergic to corndogs.   Penicillins Hives and Itching    Has patient had a PCN reaction causing immediate rash, facial/tongue/throat swelling, SOB or lightheadedness with hypotension:Yes Has patient had a PCN reaction causing  severe rash involving mucus membranes or skin necrosis:No Has patient had a PCN reaction that required hospitalization:No Has patient had a PCN reaction occurring within the last 10 years:No If all of the above answers are "NO", then may proceed with Cephalosporin use.    Latex Itching and Rash   Percocet [Oxycodone-Acetaminophen] Itching and Rash  ROS Review of Systems  Constitutional:  Negative for chills and fever.  Eyes:  Negative for visual disturbance.  Respiratory:  Negative for chest tightness and shortness of breath.   Neurological:  Positive for headaches. Negative for dizziness.      Objective:    Physical Exam HENT:     Head: Normocephalic.     Mouth/Throat:     Mouth: Mucous membranes are moist.  Cardiovascular:     Rate and Rhythm: Normal rate.     Heart sounds: Normal heart sounds.  Pulmonary:     Effort: Pulmonary effort is normal.     Breath sounds: Normal breath sounds.  Neurological:     Mental Status: She is alert.     BP 133/82 (BP Location: Right Arm, Patient Position: Sitting, Cuff Size: Large)   Pulse 86   Ht 5\' 4"  (1.626 m)   Wt 234 lb (106.1 kg)   SpO2 94%   BMI 40.17 kg/m  Wt Readings from Last 3 Encounters:  04/10/23 225 lb (102.1 kg)  04/08/23 234 lb (106.1 kg)  01/25/23 237 lb (107.5 kg)    Lab Results  Component Value Date   TSH 0.756 07/12/2022   Lab Results  Component Value Date   WBC 8.5 04/10/2023   HGB 13.1 04/10/2023   HCT 40.4 04/10/2023   MCV 95.7 04/10/2023   PLT 350 04/10/2023   Lab Results  Component Value Date   NA 139 04/10/2023   K 3.7 04/10/2023   CO2 29 04/10/2023   GLUCOSE 107 (H) 04/10/2023   BUN 8 04/10/2023   CREATININE 0.81 04/10/2023   BILITOT 0.6 04/10/2023   ALKPHOS 62 04/10/2023   AST 33 04/10/2023   ALT 39 04/10/2023   PROT 7.5 04/10/2023   ALBUMIN 4.2 04/10/2023   CALCIUM 9.4 04/10/2023   ANIONGAP 9 04/10/2023   EGFR 91 07/12/2022   Lab Results  Component Value Date   CHOL 271  (H) 07/12/2022   Lab Results  Component Value Date   HDL 45 07/12/2022   Lab Results  Component Value Date   LDLCALC 205 (H) 07/12/2022   Lab Results  Component Value Date   TRIG 115 07/12/2022   Lab Results  Component Value Date   CHOLHDL 6.0 (H) 07/12/2022   Lab Results  Component Value Date   HGBA1C 6.1 (H) 07/12/2022      Assessment & Plan:  Primary hypertension Assessment & Plan: Blood pressure was controlled today in the clinic. The patient reports not taking her Olmesartan 20 mg today and complains of a mild headache. She was encouraged to adhere to her medication regimen and advised to take Tylenol, stay well-hydrated, and follow up if her headache does not improve or worsens.  The patient was encouraged to seek immediate medical attention at the Emergency Department if she experience any of the following symptoms in conjunction with your headaches:  Acute onset of a severe headache described as "the worst headache of my life," particularly in a patient with no prior history of headaches. Unrelenting headaches that do not improve with conservative treatments or pain that progressively worsens. Lancinating or "ice-pick" pain. Severe headaches accompanied by a stiff neck and/or fever. Headaches associated with changes in mental status or level of consciousness. Persistent headaches following trauma to the head or neck. A significant change in pattern or severity of headaches in a patient with a longstanding history of chronic headaches. Your prompt evaluation is crucial for appropriate diagnosis and management.  Mixed hyperlipidemia Assessment & Plan: Stable on rosuvastatin 40 mg daily. The patient was educated to decrease her intake of greasy, fatty, and starchy foods, and to increase physical activity. Lab Results  Component Value Date   CHOL 271 (H) 07/12/2022   HDL 45 07/12/2022   LDLCALC 205 (H) 07/12/2022   TRIG 115 07/12/2022   CHOLHDL 6.0 (H)  07/12/2022       Note: This chart has been completed using [employer] software, and while attempts have been made to ensure accuracy, certain words and phrases may not be transcribed as intended.    Follow-up: Return in about 4 months (around 08/06/2023).   Gilmore Laroche, FNP

## 2023-04-08 NOTE — Patient Instructions (Signed)
I appreciate the opportunity to provide care to you today!    Follow up:  4 months  Labs: please stop by the lab during the week to get your blood drawn (CBC, CMP, TSH, Lipid profile, HgA1c, Vit D)  [facility]  [phone]. [address] [place], [place] [address].  Please seek immediate medical attention at the Emergency Department if you experience any of the following symptoms in conjunction with your headaches:  Acute onset of a severe headache described as "the worst headache of my life," particularly in a patient with no prior history of headaches. Unrelenting headaches that do not improve with conservative treatments or pain that progressively worsens. Lancinating or "ice-pick" pain. Severe headaches accompanied by a stiff neck and/or fever. Headaches associated with changes in mental status or level of consciousness. Persistent headaches following trauma to the head or neck. A significant change in pattern or severity of headaches in a patient with a longstanding history of chronic headaches. Your prompt evaluation is crucial for appropriate diagnosis and management.     Please continue to a heart-healthy diet and increase your physical activities. Try to exercise for at least five days a week.    It was a pleasure to see you and I look forward to continuing to work together on your health and well-being. Please do not hesitate to call the office if you need care or have questions about your care.  In case of emergency, please visit the Emergency Department for urgent care, or contact our clinic at [phone] to schedule an appointment. We're here to help you!   Have a wonderful day and week. With Gratitude, Gilmore Laroche MSN, FNP-BC

## 2023-04-10 ENCOUNTER — Emergency Department (HOSPITAL_COMMUNITY): Payer: No Typology Code available for payment source

## 2023-04-10 ENCOUNTER — Other Ambulatory Visit: Payer: Self-pay

## 2023-04-10 ENCOUNTER — Emergency Department (HOSPITAL_COMMUNITY)
Admission: EM | Admit: 2023-04-10 | Discharge: 2023-04-10 | Disposition: A | Discharge status: Home / Self Care | Payer: No Typology Code available for payment source | Attending: Emergency Medicine | Admitting: Emergency Medicine

## 2023-04-10 ENCOUNTER — Encounter (HOSPITAL_COMMUNITY): Payer: Self-pay | Admitting: Emergency Medicine

## 2023-04-10 DIAGNOSIS — J45909 Unspecified asthma, uncomplicated: Secondary | ICD-10-CM | POA: Insufficient documentation

## 2023-04-10 DIAGNOSIS — Z79899 Other long term (current) drug therapy: Secondary | ICD-10-CM | POA: Diagnosis not present

## 2023-04-10 DIAGNOSIS — E119 Type 2 diabetes mellitus without complications: Secondary | ICD-10-CM | POA: Insufficient documentation

## 2023-04-10 DIAGNOSIS — R0789 Other chest pain: Secondary | ICD-10-CM | POA: Diagnosis not present

## 2023-04-10 DIAGNOSIS — E041 Nontoxic single thyroid nodule: Secondary | ICD-10-CM | POA: Diagnosis not present

## 2023-04-10 DIAGNOSIS — I10 Essential (primary) hypertension: Secondary | ICD-10-CM | POA: Insufficient documentation

## 2023-04-10 DIAGNOSIS — Z91040 Latex allergy status: Secondary | ICD-10-CM | POA: Insufficient documentation

## 2023-04-10 DIAGNOSIS — M546 Pain in thoracic spine: Secondary | ICD-10-CM | POA: Insufficient documentation

## 2023-04-10 DIAGNOSIS — R079 Chest pain, unspecified: Secondary | ICD-10-CM | POA: Diagnosis not present

## 2023-04-10 LAB — URINALYSIS, ROUTINE W REFLEX MICROSCOPIC
Bilirubin Urine: NEGATIVE
Glucose, UA: NEGATIVE mg/dL
Ketones, ur: NEGATIVE mg/dL
Leukocytes,Ua: NEGATIVE
Nitrite: NEGATIVE
Protein, ur: 30 mg/dL — AB
Specific Gravity, Urine: 1.019 (ref 1.005–1.030)
pH: 6 (ref 5.0–8.0)

## 2023-04-10 LAB — CBC
HCT: 40.4 % (ref 36.0–46.0)
Hemoglobin: 13.1 g/dL (ref 12.0–15.0)
MCH: 31 pg (ref 26.0–34.0)
MCHC: 32.4 g/dL (ref 30.0–36.0)
MCV: 95.7 fL (ref 80.0–100.0)
Platelets: 350 10*3/uL (ref 150–400)
RBC: 4.22 MIL/uL (ref 3.87–5.11)
RDW: 11.7 % (ref 11.5–15.5)
WBC: 8.5 10*3/uL (ref 4.0–10.5)
nRBC: 0 % (ref 0.0–0.2)

## 2023-04-10 LAB — PREGNANCY, URINE: Preg Test, Ur: NEGATIVE

## 2023-04-10 LAB — BASIC METABOLIC PANEL
Anion gap: 9 (ref 5–15)
BUN: 8 mg/dL (ref 6–20)
CO2: 29 mmol/L (ref 22–32)
Calcium: 9.4 mg/dL (ref 8.9–10.3)
Chloride: 101 mmol/L (ref 98–111)
Creatinine, Ser: 0.81 mg/dL (ref 0.44–1.00)
GFR, Estimated: >60 mL/min (ref >60)
Glucose, Bld: 107 mg/dL — ABNORMAL HIGH (ref 70–99)
Potassium: 3.7 mmol/L (ref 3.5–5.1)
Sodium: 139 mmol/L (ref 135–145)

## 2023-04-10 LAB — HEPATIC FUNCTION PANEL
ALT: 39 U/L (ref 0–44)
AST: 33 U/L (ref 15–41)
Albumin: 4.2 g/dL (ref 3.5–5.0)
Alkaline Phosphatase: 62 U/L (ref 38–126)
Bilirubin, Direct: 0.1 mg/dL (ref 0.0–0.2)
Indirect Bilirubin: 0.5 mg/dL (ref 0.3–0.9)
Total Bilirubin: 0.6 mg/dL (ref 0.0–1.2)
Total Protein: 7.5 g/dL (ref 6.5–8.1)

## 2023-04-10 LAB — TROPONIN I (HIGH SENSITIVITY): Troponin I (High Sensitivity): <2 ng/L (ref <18)

## 2023-04-10 MED ORDER — IOHEXOL 300 MG/ML  SOLN
80.0000 mL | Freq: Once | INTRAMUSCULAR | Status: AC | PRN
  Administered 2023-04-10: 80 mL via INTRAVENOUS

## 2023-04-10 MED ORDER — LIDOCAINE 5 % EX PTCH: 1.0000 | MEDICATED_PATCH | CUTANEOUS | 0 refills | Status: DC

## 2023-04-10 NOTE — ED Provider Notes (Signed)
[facility] AT [facility] Provider Note  CSN: [id number] Arrival date & time: 04/10/23 1719  Chief Complaint(s) Chest Pain  HPI Breanna Malone is a 43 y.o. adult history of bipolar disorder, asthma, diabetes, hyperlipidemia, hypertension presenting to the emergency department with back pain.  Patient reports pain to her thoracic back radiating around to her flank around the site where she had a prior rib resection.  She reports that has been present for around 3 days.  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  No shortness of breath.  No fevers or chills.  No abdominal pain.  No nausea or vomiting.  Has been taking Tylenol Motrin with some improvement   Past Medical History Past Medical History:  Diagnosis Date   Abnormal Pap smear    Arthritis    Asthma    Asthma    Bipolar 1 disorder (HCC)    Bone spur    rib   Bronchitis    BV (bacterial vaginosis) 10/08/2019   8/19 rx flagyl   Carpal tunnel syndrome    Cervical spine pain    Chest pain    Diabetes mellitus without complication (HCC)    Elevated cholesterol    High cholesterol    Hyperlipidemia    Hyperlipoproteinemia    Hypertension    Post traumatic stress disorder    Right ovarian cyst 10/31/2012   Complex right ovarian cyst seen 8/18 in ER need f/u US   Tendonitis    Vaginal Pap smear, abnormal    Patient Active Problem List   Diagnosis Date Noted   Acute laryngitis 01/22/2023   Left-sided thoracic back pain 12/17/2022   Acute bilateral low back pain without sciatica 12/07/2022   Acute pain of right knee 08/01/2022   Chronic left shoulder pain 07/04/2022   Irregular periods 06/13/2022   Pregnancy examination or test, negative result 06/13/2022   Abnormal urine color 06/13/2022   Umbilical hernia without obstruction and without gangrene 06/13/2022   Vaginal discharge 06/13/2022   MDD (major depressive disorder)  12/27/2021   Morbid obesity (HCC) 12/15/2021   Hyperlipidemia 12/15/2021   Hypertension 12/15/2021   Enchondroma of bone 12/27/2020   Screening for diabetes mellitus 07/27/2020   Carpal tunnel syndrome of right wrist    Condyloma acuminatum in female 02/24/2014   Abnormal Pap smear of cervix 11/24/2012   Asthma 11/24/2012   Bipolar 1 disorder (HCC) 10/31/2012   Home Medication(s) Prior to Admission medications   Medication Sig Start Date End Date Taking? Authorizing Provider  lidocaine (LIDODERM) 5 % Place 1 patch onto the skin daily. Remove & Discard patch within 12 hours or as directed by MD 04/10/23  Yes Lonell Grandchild, MD  albuterol (VENTOLIN HFA) 108 (90 Base) MCG/ACT inhaler INHALE 1 OR 2 PUFFS INTO THE LUNGS EVERY 6 HOURS AS NEEDED FOR WHEEZING. 02/25/23   Gilmore Laroche, FNP  benzonatate (TESSALON) 100 MG capsule Take 1 capsule (100 mg total) by mouth every 8 (eight) hours. 01/26/23   Dione Booze, MD  buPROPion (WELLBUTRIN XL) 150 MG 24 hr tablet Take 1 tablet (150 mg total) by mouth daily. 02/25/23   Gilmore Laroche, FNP  cyclobenzaprine (FLEXERIL) 5 MG tablet Take 1 tablet (5 mg total) by mouth at bedtime as needed for muscle spasms. 02/25/23   Gilmore Laroche, FNP  gabapentin (NEURONTIN) 300 MG capsule Take 1 capsule (300 mg total) by mouth 3 (three) times daily  as needed. 02/25/23   Gilmore Laroche, FNP  medroxyPROGESTERone Acetate 150 MG/ML SUSY INJECT 1 ML (150MG ) INTRAMUSCULARLY EVERY 3 MONTHS. 09/25/22   Adline Potter, NP  Multiple Vitamin (MULTIVITAMIN) capsule Take 1 capsule by mouth daily.    [provider]  olmesartan (BENICAR) 20 MG tablet Take 1 tablet (20 mg total) by mouth daily. 02/25/23   Gilmore Laroche, FNP  ondansetron (ZOFRAN-ODT) 4 MG disintegrating tablet 4mg  ODT q4 hours prn nausea/vomit 02/25/23   Gilmore Laroche, FNP  predniSONE (DELTASONE) 50 MG tablet Take 1 tablet (50 mg total) by mouth daily. 01/26/23   Dione Booze, MD  rosuvastatin (CRESTOR) 40  MG tablet Take 1 tablet (40 mg total) by mouth daily. 02/25/23   Gilmore Laroche, FNP  UNABLE TO FIND RES-Q 105 max 1 daily.    [provider]                                                                                                                                    Past Surgical History Past Surgical History:  Procedure Laterality Date   CARPAL TUNNEL RELEASE Right 06/28/2016   Procedure: RIGHT CARPAL TUNNEL RELEASE;  Surgeon: Vickki Hearing, MD;  Location: [facility];  Service: Orthopedics;  Laterality: Right;   HERNIA REPAIR     umbilical   RIB RESECTION Left 03/16/2021   Procedure: RESECTION OF TUMOR LEFT NINTH RIB;  Surgeon: Loreli Slot, MD;  Location: [facility] OR;  Service: Thoracic;  Laterality: Left;   Family History Family History  Problem Relation Age of Onset   Diabetes Mother    Hypertension Mother    Asthma Father    Bronchitis Daughter    Asthma Daughter    Asthma Daughter    Bronchitis Daughter    Asthma Son    Bronchitis Son    Asthma Son    Stroke Paternal Grandmother    Heart attack Paternal Grandmother    Cancer Paternal Grandmother    Cancer Cousin    Asthma Other    Bronchitis Other     Social History Social History   Tobacco Use   Smoking status: Never   Smokeless tobacco: Never  Vaping Use   Vaping status: Never Used  Substance Use Topics   Alcohol use: No   Drug use: No   Allergies Darvocet [propoxyphene n-acetaminophen], Hydrocodone-acetaminophen, Lactose intolerance (gi), Other, Penicillins, Latex, and Percocet [oxycodone-acetaminophen]  Review of Systems Review of Systems  All other systems reviewed and are negative.   Physical Exam Vital Signs  I have reviewed the triage vital signs BP (!) 148/95   Pulse 75   Temp 98.1 F (36.7 C)   Resp 17   Ht 5\' 4"  (1.626 m)   Wt 102.1 kg   SpO2 99%   BMI 38.62 kg/m  Physical Exam Vitals and nursing note reviewed.  Constitutional:      General: She is not in acute  distress.    Appearance: Normal appearance.  HENT:     Mouth/Throat:     Mouth: Mucous membranes are moist.  Eyes:     Conjunctiva/sclera: Conjunctivae normal.  Cardiovascular:     Rate and Rhythm: Normal rate and regular rhythm.  Pulmonary:     Effort: Pulmonary effort is normal. No respiratory distress.     Breath sounds: Normal breath sounds.  Chest:     Comments: Left chest/flank surgical scar, mild tenderness around this area, no appreciable swelling, no warmth, no erythema Abdominal:     General: Abdomen is flat.     Palpations: Abdomen is soft.     Tenderness: There is no abdominal tenderness.  Musculoskeletal:     Right lower leg: No edema.     Left lower leg: No edema.  Skin:    General: Skin is warm and dry.     Capillary Refill: Capillary refill takes less than 2 seconds.  Neurological:     Mental Status: She is alert and oriented to person, place, and time. Mental status is at baseline.  Psychiatric:        Mood and Affect: Mood normal.        Behavior: Behavior normal.     ED Results and Treatments Labs (all labs ordered are listed, but only abnormal results are displayed) Labs Reviewed  BASIC METABOLIC PANEL - Abnormal; Notable for the following components:      Result Value   Glucose, Bld 107 (*)    All other components within normal limits  URINALYSIS, ROUTINE W REFLEX MICROSCOPIC - Abnormal; Notable for the following components:   APPearance HAZY (*)    Hgb urine dipstick SMALL (*)    Protein, ur 30 (*)    Bacteria, UA RARE (*)    All other components within normal limits  CBC  HEPATIC FUNCTION PANEL  PREGNANCY, URINE  POC URINE PREG, ED  TROPONIN I (HIGH SENSITIVITY)                                                                                                                          Radiology CT Chest W Contrast Result Date: 04/10/2023 CLINICAL DATA:  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 RADIATION DOSE REDUCTION: This exam was performed according to the departmental dose-optimization program which includes automated exposure control, adjustment of the mA and/or kV according to patient size and/or use of iterative reconstruction technique. CONTRAST:  80mL OMNIPAQUE IOHEXOL 300 MG/ML  SOLN COMPARISON:  01/08/2022 FINDINGS: Cardiovascular: The heart is normal in size. No pericardial effusion. No evidence of thoracic aortic aneurysm. Mediastinum/Nodes: No suspicious mediastinal lymphadenopathy. 2.2 cm left thyroid nodule. Recommend thyroid US (ref: J Am Coll Radiol. 2015 Feb;12(2): 143-50). Lungs/Pleura: Mild left lower lobe scarring. No suspicious pulmonary nodules. No focal consolidation. No pleural effusion or pneumothorax. Upper Abdomen: Visualized upper abdomen  is grossly unremarkable. Musculoskeletal: Status post left 9th rib subtotal resection. No chest wall mass or abnormality. Visualized osseous structures are otherwise within normal limits. IMPRESSION: Status post left 9th rib subtotal resection. No chest wall mass or abnormality. 2.2 cm left thyroid nodule. Recommend thyroid US. Electronically Signed   By: Charline Bills M.D.   On: 04/10/2023 21:33   DG Chest 2 View Result Date: 04/10/2023 CLINICAL DATA:  Chest pain. EXAM: CHEST - 2 VIEW COMPARISON:  Chest radiograph dated 01/26/2023. FINDINGS: No focal consolidation, pleural effusion, pneumothorax. Blunting of the left costophrenic angle and scarring. The cardiac silhouette is within normal limits. No acute osseous pathology. IMPRESSION: No active cardiopulmonary disease. Electronically Signed   By: Elgie Collard M.D.   On: 04/10/2023 18:10    Pertinent labs & imaging results that were available during my care of the patient were reviewed by me and considered in my medical decision making (see MDM for  details).  Medications Ordered in ED Medications  iohexol (OMNIPAQUE) 300 MG/ML solution 80 mL (80 mLs Intravenous Contrast Given 04/10/23 2121)                                                                                                                                     Procedures Procedures  (including critical care time)  Medical Decision Making / ED Course   MDM:  43 year old presenting with pain around surgical site.  Patient well-appearing, physical examination with some tenderness around old surgical site with no focal abnormality appreciated such as swelling, erythema, warmth, wound dehiscence.  CT chest was obtained with no evidence of underlying process per radiology.  Will give lidocaine patch, recommended close follow-up with her surgeon who performed the procedure given her recurrent pain.  Will discharge patient to home. All questions answered. Patient comfortable with plan of discharge. Return precautions discussed with patient and specified on the after visit summary.       Additional history obtained:  -External records from outside source obtained and reviewed including: Chart review including previous notes, labs, imaging, consultation notes including prior pathology notes    Lab Tests: -I ordered, reviewed, and interpreted labs.   The pertinent results include:   Labs Reviewed  BASIC METABOLIC PANEL - Abnormal; Notable for the following components:      Result Value   Glucose, Bld 107 (*)    All other components within normal limits  URINALYSIS, ROUTINE W REFLEX MICROSCOPIC - Abnormal; Notable for the following components:   APPearance HAZY (*)    Hgb urine dipstick SMALL (*)    Protein, ur 30 (*)    Bacteria, UA RARE (*)    All other components within normal limits  CBC  HEPATIC FUNCTION PANEL  PREGNANCY, URINE  POC URINE PREG, ED  TROPONIN I (HIGH SENSITIVITY)    Notable for no UTI, normal labs    Imaging Studies ordered: I ordered  imaging studies including  CT chest On my interpretation imaging demonstrates old rib resection I independently visualized and interpreted imaging. I agree with the radiologist interpretation   Medicines ordered and prescription drug management: Meds ordered this encounter  Medications   iohexol (OMNIPAQUE) 300 MG/ML solution 80 mL   lidocaine (LIDODERM) 5 %    Sig: Place 1 patch onto the skin daily. Remove & Discard patch within 12 hours or as directed by MD    Dispense:  30 patch    Refill:  0    -I have reviewed the patients home medicines and have made adjustments as needed  Social Determinants of Health:  Diagnosis or treatment significantly limited by social determinants of health: obesity   Reevaluation: After the interventions noted above, I reevaluated the patient and found that their symptoms have improved  Co morbidities that complicate the patient evaluation  Past Medical History:  Diagnosis Date   Abnormal Pap smear    Arthritis    Asthma    Asthma    Bipolar 1 disorder (HCC)    Bone spur    rib   Bronchitis    BV (bacterial vaginosis) 10/08/2019   8/19 rx flagyl   Carpal tunnel syndrome    Cervical spine pain    Chest pain    Diabetes mellitus without complication (HCC)    Elevated cholesterol    High cholesterol    Hyperlipidemia    Hyperlipoproteinemia    Hypertension    Post traumatic stress disorder    Right ovarian cyst 10/31/2012   Complex right ovarian cyst seen 8/18 in ER need f/u US   Tendonitis    Vaginal Pap smear, abnormal       Dispostion: Disposition decision including need for hospitalization was considered, and patient discharged from emergency department.    Final Clinical Impression(s) / ED Diagnoses Final diagnoses:  Acute left-sided thoracic back pain     This chart was dictated using voice recognition software.  Despite best efforts to proofread,  errors can occur which can change the documentation meaning.     Lonell Grandchild, MD 04/10/23 [phone]

## 2023-04-10 NOTE — Discharge Instructions (Addendum)
We evaluated you for your back pain.  Your CT scan does not show any dangerous findings such as a complication of your rib surgery.  Please take Tylenol and Motrin for your symptoms at home.  You can take 1000 mg of Tylenol every 6 hours and 600 mg of ibuprofen every 6 hours as needed for your symptoms.  You can take these medicines together as needed, either at the same time, or alternating every 3 hours.  We have also prescribed you some additional lidocaine patches.  You can apply this over the area of pain.  Since you had surgery in this area, I would also follow-up with your surgeon, Dr. Dorris Fetch.

## 2023-04-10 NOTE — ED Triage Notes (Signed)
Pt reports she had a tumor removed from her 9th rib on the left side. She is reporting pain of 7/10 for 3 days in that area. PT is concerned there may be another tumor or knot in that area. She sis struggling to ambulate and complete daily activities

## 2023-04-10 NOTE — ED Provider Triage Note (Signed)
Emergency Medicine Provider Triage Evaluation Note  RYLINN LINZY , a 43 y.o. adult  was evaluated in triage.  Pt complains of left lateral rib pain for 3 days.  No known injury.  She states she had a benign tumor resected from her ninth rib.  She has had pain to that area that is worsening with movement.  She is concerned that the tumor has reoccurred, feels the area is swollen.  Denies any shortness of breath.  Pain radiates into her left lower abdomen as well.  She denies any vomiting, fever, cough, chills.    Review of Systems  Positive: Left flank, chest wall pain, abdominal pain Negative: Fever, dysuria, vomiting  Physical Exam  BP (!) 158/106 (BP Location: Right Arm)   Pulse 84   Temp 98.6 F (37 C) (Oral)   Resp 18   Ht 5\' 4"  (1.626 m)   Wt 102.1 kg   SpO2 98%   BMI 38.62 kg/m  Gen:   Awake, no distress   Resp:  Normal effort  MSK:   Moves extremities without difficulty  Other:  Left chest wall pain  Medical Decision Making  Medically screening exam initiated at 6:13 PM.  Appropriate orders placed.  Malachi Paradise was informed that the remainder of the evaluation will be completed by another provider, this initial triage assessment does not replace that evaluation, and the importance of remaining in the ED until their evaluation is complete.     Pauline Aus, PA-C 04/10/23 1843

## 2023-04-10 NOTE — ED Notes (Signed)
Pt given water and crackers per request

## 2023-04-11 NOTE — Assessment & Plan Note (Signed)
Blood pressure was controlled today in the clinic. The patient reports not taking her Olmesartan 20 mg today and complains of a mild headache. She was encouraged to adhere to her medication regimen and advised to take Tylenol, stay well-hydrated, and follow up if her headache does not improve or worsens.  The patient was encouraged to seek immediate medical attention at the Emergency Department if she experience any of the following symptoms in conjunction with your headaches:  Acute onset of a severe headache described as "the worst headache of my life," particularly in a patient with no prior history of headaches. Unrelenting headaches that do not improve with conservative treatments or pain that progressively worsens. Lancinating or "ice-pick" pain. Severe headaches accompanied by a stiff neck and/or fever. Headaches associated with changes in mental status or level of consciousness. Persistent headaches following trauma to the head or neck. A significant change in pattern or severity of headaches in a patient with a longstanding history of chronic headaches. Your prompt evaluation is crucial for appropriate diagnosis and management.

## 2023-04-11 NOTE — Assessment & Plan Note (Signed)
Stable on rosuvastatin 40 mg daily. The patient was educated to decrease her intake of greasy, fatty, and starchy foods, and to increase physical activity. Lab Results  Component Value Date   CHOL 271 (H) 07/12/2022   HDL 45 07/12/2022   LDLCALC 205 (H) 07/12/2022   TRIG 115 07/12/2022   CHOLHDL 6.0 (H) 07/12/2022

## 2023-04-16 ENCOUNTER — Ambulatory Visit: Payer: Self-pay | Admitting: Family Medicine

## 2023-04-16 NOTE — Telephone Encounter (Signed)
 Copied from CRM [id number]. Topic: Clinical - Red Word Triage >> Apr 16, 2023  2:20 PM Fonda Kinder J wrote: Red Word that prompted transfer to Nurse Triage: Pain in stomach and ribs, unable to walk  Chief Complaint: abd pain and rib pain Symptoms: 7/10 pain constant Frequency: constant Pertinent Negatives: Patient denies fever, n/v/d, cp, sob Disposition: [x] ED /[] Urgent Care (no appt availability in office) / [] Appointment(In office/virtual)/ []  [facility]/ [] Home Care/ [] Refused Recommended Disposition /[] [facility]/ []  Follow-up with PCP Additional Notes: per protocol, instructed to go to the ER.  Care advice given, denies questions, pcp office updated  Reason for Disposition  [1] SEVERE pain (e.g., excruciating) AND [2] present > 1 hour  Answer Assessment - Initial Assessment Questions 1. LOCATION: "Where does it hurt?"      Where belly button is feels that something is pulling on the inside 2. RADIATION: "Does the pain shoot anywhere else?" (e.g., chest, back)     ribs 3. ONSET: "When did the pain begin?" (e.g., minutes, hours or days ago)      About a week 4. SUDDEN: "Gradual or sudden onset?"     sudden 5. PATTERN "Does the pain come and go, or is it constant?"    - If it comes and goes: "How long does it last?" "Do you have pain now?"     (Note: Comes and goes means the pain is intermittent. It goes away completely between bouts.)    - If constant: "Is it getting better, staying the same, or getting worse?"      (Note: Constant means the pain never goes away completely; most serious pain is constant and gets worse.)      constant 6. SEVERITY: "How bad is the pain?"  (e.g., Scale 1-10; mild, moderate, or severe)    - MILD (1-3): Doesn't interfere with normal activities, abdomen soft and not tender to touch.     - MODERATE (4-7): Interferes with normal activities or awakens from sleep, abdomen tender to touch.     - SEVERE (8-10): Excruciating pain, doubled  over, unable to do any normal activities.       "I can't walk", 7/10 7. RECURRENT SYMPTOM: "Have you ever had this type of stomach pain before?" If Yes, ask: "When was the last time?" and "What happened that time?"      Yes, she had surgery 8. CAUSE: "What do you think is causing the stomach pain?"     unknown 9. RELIEVING/AGGRAVATING FACTORS: "What makes it better or worse?" (e.g., antacids, bending or twisting motion, bowel movement)    Tylenol helps 10. OTHER SYMPTOMS: "Do you have any other symptoms?" (e.g., back pain, diarrhea, fever, urination pain, vomiting)       denies 11. PREGNANCY: "Is there any chance you are pregnant?" "When was your last menstrual period?"       na  Protocols used: Abdominal Pain - [facility]

## 2023-04-17 ENCOUNTER — Emergency Department (HOSPITAL_COMMUNITY)
Admission: EM | Admit: 2023-04-17 | Discharge: 2023-04-18 | Discharge status: Against Medical Advice | Payer: No Typology Code available for payment source | Source: Home / Self Care

## 2023-04-24 ENCOUNTER — Ambulatory Visit (INDEPENDENT_AMBULATORY_CARE_PROVIDER_SITE_OTHER): Admitting: Student

## 2023-04-24 DIAGNOSIS — M549 Dorsalgia, unspecified: Secondary | ICD-10-CM

## 2023-04-24 DIAGNOSIS — M545 Low back pain, unspecified: Secondary | ICD-10-CM | POA: Diagnosis not present

## 2023-04-24 DIAGNOSIS — G8929 Other chronic pain: Secondary | ICD-10-CM

## 2023-04-24 MED ORDER — METHOCARBAMOL 500 MG PO TABS: 500.0000 mg | ORAL_TABLET | Freq: Four times a day (QID) | ORAL | 0 refills | Status: AC | PRN

## 2023-04-24 NOTE — Progress Notes (Signed)
 Chief Complaint: Thoracic and lumbar back pain     History of Present Illness:   Breanna Malone is a 43 year old female who presents with severe thoracic and back pain. She has been experiencing this for the past three weeks without any known cause.  The pain radiates throughout the thoracic area and is exacerbated by sitting and walking. It is described as a stabbing sensation, rated 8 out of 10 in severity, and affects her ability to move and walk.  For pain relief, she has been using Advil and a lidocaine patch, which have not been effective.  Has tried Flexeril with mild relief.  She experiences significant pain when walking and is impacting her daily activities. No numbness, tingling, or loss of sensation in the legs is reported. She notes tightness in the muscles of the back and rib area, worsening with prolonged sitting. 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 The current pain is similar to the post-surgery pain, but recent CT shows no recurrence or abnormalities at the surgical site.  Surgical History:   Ninth left rib tumor resection 2023  PMH/PSH/Family History/Social History/Meds/Allergies:    Past Medical History:  Diagnosis Date   Abnormal Pap smear    Arthritis    Asthma    Asthma    Bipolar 1 disorder (HCC)    Bone spur    rib   Bronchitis    BV (bacterial vaginosis) 10/08/2019   8/19 rx flagyl   Carpal tunnel syndrome    Cervical spine pain    Chest pain    Diabetes mellitus without complication (HCC)    Elevated cholesterol    High cholesterol    Hyperlipidemia    Hyperlipoproteinemia    Hypertension    Post traumatic stress disorder    Right ovarian cyst 10/31/2012   Complex right ovarian cyst seen 8/18 in ER need f/u US   Tendonitis    Vaginal Pap smear, abnormal    Past Surgical History:  Procedure Laterality Date   CARPAL TUNNEL RELEASE Right 06/28/2016   Procedure: RIGHT CARPAL TUNNEL RELEASE;   Surgeon: Vickki Hearing, MD;  Location: [facility];  Service: Orthopedics;  Laterality: Right;   HERNIA REPAIR     umbilical   RIB RESECTION Left 03/16/2021   Procedure: RESECTION OF TUMOR LEFT NINTH RIB;  Surgeon: Loreli Slot, MD;  Location: [facility] OR;  Service: Thoracic;  Laterality: Left;   Social History   Socioeconomic History   Marital status: Widowed    Spouse name: Not on file   Number of children: Not on file   Years of education: Not on file   Highest education level: Not on file  Occupational History   Not on file  Tobacco Use   Smoking status: Never   Smokeless tobacco: Never  Vaping Use   Vaping status: Never Used  Substance and Sexual Activity   Alcohol use: No   Drug use: No   Sexual activity: Yes    Birth control/protection: Condom  Other Topics Concern   Not on file  Social History Narrative   Not on file   Social Drivers of Health   Financial Resource Strain: Low Risk  (07/27/2020)   Overall Financial Resource Strain (CARDIA)    Difficulty of Paying Living Expenses: Not hard  at all  Food Insecurity: No Food Insecurity (07/27/2020)   Hunger Vital Sign    Worried About Running Out of Food in the Last Year: Never true    Ran Out of Food in the Last Year: Never true  Transportation Needs: Unmet Transportation Needs (07/27/2020)   PRAPARE - Transportation    Lack of Transportation (Medical): No    Lack of Transportation (Non-Medical): Yes  Physical Activity: Insufficiently Active (07/27/2020)   Exercise Vital Sign    Days of Exercise per Week: 2 days    Minutes of Exercise per Session: 30 min  Stress: No Stress Concern Present (07/27/2020)   [employer] of Occupational Health - Occupational Stress Questionnaire    Feeling of Stress : Not at all  Social Connections: Moderately Isolated (07/27/2020)   Social Connection and Isolation Panel [NHANES]    Frequency of Communication with Friends and Family: Three times a week    Frequency of Social  Gatherings with Friends and Family: Three times a week    Attends Religious Services: 1 to 4 times per year    Active Member of Clubs or Organizations: No    Attends Banker Meetings: Never    Marital Status: Widowed   Family History  Problem Relation Age of Onset   Diabetes Mother    Hypertension Mother    Asthma Father    Bronchitis Daughter    Asthma Daughter    Asthma Daughter    Bronchitis Daughter    Asthma Son    Bronchitis Son    Asthma Son    Stroke Paternal Grandmother    Heart attack Paternal Grandmother    Cancer Paternal Grandmother    Cancer Cousin    Asthma Other    Bronchitis Other    Allergies  Allergen Reactions   Darvocet [Propoxyphene N-Acetaminophen] Itching   Hydrocodone-Acetaminophen Itching   Lactose Intolerance (Gi) Other (See Comments)    Causes stomach cramping.   Other Hives    Patient is allergic to corndogs.   Penicillins Hives and Itching    Has patient had a PCN reaction causing immediate rash, facial/tongue/throat swelling, SOB or lightheadedness with hypotension:Yes Has patient had a PCN reaction causing severe rash involving mucus membranes or skin necrosis:No Has patient had a PCN reaction that required hospitalization:No Has patient had a PCN reaction occurring within the last 10 years:No If all of the above answers are "NO", then may proceed with Cephalosporin use.    Latex Itching and Rash   Percocet [Oxycodone-Acetaminophen] Itching and Rash   Current Outpatient Medications  Medication Sig Dispense Refill   albuterol (VENTOLIN HFA) 108 (90 Base) MCG/ACT inhaler INHALE 1 OR 2 PUFFS INTO THE LUNGS EVERY 6 HOURS AS NEEDED FOR WHEEZING. 8.5 g 0   benzonatate (TESSALON) 100 MG capsule Take 1 capsule (100 mg total) by mouth every 8 (eight) hours. 21 capsule 0   buPROPion (WELLBUTRIN XL) 150 MG 24 hr tablet Take 1 tablet (150 mg total) by mouth daily. 90 tablet 1   cyclobenzaprine (FLEXERIL) 5 MG tablet Take 1 tablet (5 mg  total) by mouth at bedtime as needed for muscle spasms. 30 tablet 1   gabapentin (NEURONTIN) 300 MG capsule Take 1 capsule (300 mg total) by mouth 3 (three) times daily as needed. 30 capsule 0   lidocaine (LIDODERM) 5 % Place 1 patch onto the skin daily. Remove & Discard patch within 12 hours or as directed by MD 30 patch 0   medroxyPROGESTERone Acetate 150  MG/ML SUSY INJECT 1 ML (150MG ) INTRAMUSCULARLY EVERY 3 MONTHS. 1 mL 0   Multiple Vitamin (MULTIVITAMIN) capsule Take 1 capsule by mouth daily.     olmesartan (BENICAR) 20 MG tablet Take 1 tablet (20 mg total) by mouth daily. 90 tablet 1   ondansetron (ZOFRAN-ODT) 4 MG disintegrating tablet 4mg  ODT q4 hours prn nausea/vomit 10 tablet 0   predniSONE (DELTASONE) 50 MG tablet Take 1 tablet (50 mg total) by mouth daily. 5 tablet 0   rosuvastatin (CRESTOR) 40 MG tablet Take 1 tablet (40 mg total) by mouth daily. 90 tablet 3   UNABLE TO FIND RES-Q 105 max 1 daily.     No current facility-administered medications for this visit.   No results found.  Review of Systems:   A ROS was performed including pertinent positives and negatives as documented in the HPI.  Physical Exam :   Constitutional: NAD and appears stated age Neurological: Alert and oriented Psych: Appropriate affect and cooperative There were no vitals taken for this visit.   Comprehensive Musculoskeletal Exam:    Significant paraspinal muscle tenderness throughout the thoracic and lumbar spine with increased muscle tension throughout.  No midline tenderness.  Limited spinal ROM with flexion and rotation.  Full passive hip ROM bilaterally.  Knee flexion/extension and ankle dorsiflexion/plantar flexion strength 5/5.  Patellar DTRs 2+.  Imaging:   Xray review from October 2024 (lumbar spine 4 views, thoracic spine 2 views): Negative for bony abnormality   I personally reviewed and interpreted the radiographs.   Assessment:   Patient is experiencing severe pain in the thoracic  and lumbar regions which does appear to be muscular in nature.  Denies any radicular symptoms, numbness, or tingling.  Imaging including CT and x-rays of the thoracic and lumbar spine thus far have appeared normal.  I do believe she would benefit from physical therapy to address the muscular tension throughout the spine.  Will try her on a course of Robaxin and recommend using heat/massage for pain relief.  Plan :    -Referral to physical therapy and return to clinic in 5 weeks should symptoms fail to improve     I personally saw and evaluated the patient, and participated in the management and treatment plan.  Hazle Nordmann, PA-C Orthopedics

## 2023-05-01 ENCOUNTER — Ambulatory Visit (INDEPENDENT_AMBULATORY_CARE_PROVIDER_SITE_OTHER): Payer: No Typology Code available for payment source | Admitting: Thoracic Surgery (Cardiothoracic Vascular Surgery)

## 2023-05-01 ENCOUNTER — Encounter: Payer: Self-pay | Admitting: Thoracic Surgery (Cardiothoracic Vascular Surgery)

## 2023-05-01 VITALS — BP 160/96 | HR 93 | Resp 18 | Ht 64.0 in | Wt 232.0 lb

## 2023-05-01 DIAGNOSIS — M546 Pain in thoracic spine: Secondary | ICD-10-CM

## 2023-05-01 DIAGNOSIS — Z09 Encounter for follow-up examination after completed treatment for conditions other than malignant neoplasm: Secondary | ICD-10-CM | POA: Diagnosis not present

## 2023-05-01 MED ORDER — GABAPENTIN 300 MG PO CAPS: 300.0000 mg | ORAL_CAPSULE | Freq: Three times a day (TID) | ORAL | 2 refills | Status: DC | PRN

## 2023-05-01 NOTE — Progress Notes (Signed)
 [address]       Breanna Malone [address]             [phone]     HPI: Breanna Malone returns with complaints of back pain radiating around her ribs and pain along the costal margins bilaterally.  Breanna Malone is a 43 year old woman with a past medical history significant for hypertension, hyperlipidemia, type 2 diabetes, bipolar disorder, asthma, arthritis, and a rib mass resected in 2023.  Originally presented with left-sided flank/rib pain.  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  The mass had increased in size on MR.  She had a resection of the mass and reconstruction using mesh in January 2023.  She had weaned off of narcotics by April.  Was still taking gabapentin my last saw her in May 2023.  She presented to the ED on 04/10/2023 with back pain rating around her left flank.  Also complained of pain anteriorly below the costal margin on both sides.  CT was unremarkable except for the presence of the resected ninth rib.  She was given Lidoderm patches which helped a little bit.  She saw Breanna Malone a physicians assistant with [facility].  Felt the pain was likely muscular in origin.  Was given a prescription for Robaxin and physical therapy.  She has not yet started physical therapy.  She describes the pain as sharp.  Worse in the back.  Also radiates around the left side in the area of her previous surgery.  No paresthesias.  Past Medical History:  Diagnosis Date   Abnormal Pap smear    Arthritis    Asthma    Asthma    Bipolar 1 disorder (HCC)    Bone spur    rib   Bronchitis    BV (bacterial vaginosis) 10/08/2019   8/19 rx flagyl   Carpal tunnel syndrome    Cervical spine pain    Chest pain    Diabetes mellitus without complication (HCC)    Elevated cholesterol    High cholesterol    Hyperlipidemia    Hyperlipoproteinemia    Hypertension    Post traumatic stress disorder    Right ovarian cyst 10/31/2012   Complex right  ovarian cyst seen 8/18 in ER need f/u US   Tendonitis    Vaginal Pap smear, abnormal     Current Outpatient Medications  Medication Sig Dispense Refill   albuterol (VENTOLIN HFA) 108 (90 Base) MCG/ACT inhaler INHALE 1 OR 2 PUFFS INTO THE LUNGS EVERY 6 HOURS AS NEEDED FOR WHEEZING. 8.5 g 0   benzonatate (TESSALON) 100 MG capsule Take 1 capsule (100 mg total) by mouth every 8 (eight) hours. 21 capsule 0   buPROPion (WELLBUTRIN XL) 150 MG 24 hr tablet Take 1 tablet (150 mg total) by mouth daily. 90 tablet 1   cyclobenzaprine (FLEXERIL) 5 MG tablet Take 1 tablet (5 mg total) by mouth at bedtime as needed for muscle spasms. 30 tablet 1   medroxyPROGESTERone Acetate 150 MG/ML SUSY INJECT 1 ML (150MG ) INTRAMUSCULARLY EVERY 3 MONTHS. 1 mL 0   methocarbamol (ROBAXIN) 500 MG tablet Take 1 tablet (500 mg total) by mouth every 6 (six) hours as needed for up to 10 days for muscle spasms. 40 tablet 0   Multiple Vitamin (MULTIVITAMIN) capsule Take 1 capsule by mouth daily.     olmesartan (BENICAR) 20 MG tablet Take 1 tablet (20 mg total) by mouth daily. 90 tablet  1   ondansetron (ZOFRAN-ODT) 4 MG disintegrating tablet 4mg  ODT q4 hours prn nausea/vomit 10 tablet 0   predniSONE (DELTASONE) 50 MG tablet Take 1 tablet (50 mg total) by mouth daily. 5 tablet 0   rosuvastatin (CRESTOR) 40 MG tablet Take 1 tablet (40 mg total) by mouth daily. 90 tablet 3   UNABLE TO FIND RES-Q 105 max 1 daily.     gabapentin (NEURONTIN) 300 MG capsule Take 1 capsule (300 mg total) by mouth 3 (three) times daily as needed. 90 capsule 2   lidocaine (LIDODERM) 5 % Place 1 patch onto the skin daily. Remove & Discard patch within 12 hours or as directed by MD (Patient not taking: Reported on 05/01/2023) 30 patch 0   No current facility-administered medications for this visit.    Physical Exam BP (!) 160/96 (BP Location: Right Arm)   Pulse 93   Resp 18   Ht 5\' 4"  (1.626 m)   Wt 232 lb (105.2 kg)   SpO2 94%   BMI 39.82 kg/m   Well-appearing 43 year old woman in no acute distress Alert and oriented x 3 with no focal deficits Tenderness to palpation of the paraspinal muscles bilaterally.  Incision intact with no herniation.  No erythema or induration. Lungs clear bilaterally  Diagnostic Tests: CT CHEST WITH CONTRAST   TECHNIQUE: Multidetector CT imaging of the chest was performed during intravenous contrast administration.   RADIATION DOSE REDUCTION: This exam was performed according to the departmental dose-optimization program which includes automated exposure control, adjustment of the mA and/or kV according to patient size and/or use of iterative reconstruction technique.   CONTRAST:  80mL OMNIPAQUE IOHEXOL 300 MG/ML  SOLN   COMPARISON:  01/08/2022   FINDINGS: Cardiovascular: The heart is normal in size. No pericardial effusion.   No evidence of thoracic aortic aneurysm.   Mediastinum/Nodes: No suspicious mediastinal lymphadenopathy.   2.2 cm left thyroid nodule. Recommend thyroid US (ref: J Am Coll Radiol. 2015 Feb;12(2): 143-50).   Lungs/Pleura: Mild left lower lobe scarring.   No suspicious pulmonary nodules.   No focal consolidation.   No pleural effusion or pneumothorax.   Upper Abdomen: Visualized upper abdomen is grossly unremarkable.   Musculoskeletal: Status post left 9th rib subtotal resection. No chest wall mass or abnormality. Visualized osseous structures are otherwise within normal limits.   IMPRESSION: Status post left 9th rib subtotal resection. No chest wall mass or abnormality.   2.2 cm left thyroid nodule. Recommend thyroid US.     Electronically Signed   By: Charline Bills M.D.   On: 04/10/2023 21:33 I personally reviewed her CT images.  Status post ninth rib resection.  No other suspicious findings.  Impression: Breanna Malone is a 43 year old woman with a past medical history significant for hypertension, hyperlipidemia, type 2 diabetes, bipolar  disorder, asthma, arthritis, and a benign rib mass resected in 2023.  Back and left flank/chest wall pain-likely muscular in origin.  No acute skeletal abnormalities on CT.  No apparent spine issues.  Status post ninth rib resection but that was 2 years ago.  No evidence of herniation.  I advised her that I recommended she participate in physical therapy as prescribed and also use the Robaxin as prescribed.  In addition I am going to start her on gabapentin 300 mg 3 times daily.  She has been on that before and tolerated it well.  Not sure how effective will be in the setting but is worth a try.   Plan: I agree with  plan for physical therapy Continue Robaxin Will resume gabapentin 300 mg 3 times daily, 90 tablets, 2 refills Follow-up with Breanna Malone as scheduled I will be happy to see her back if I can be of any assistance with her care  Loreli Slot, MD [facility] [phone]

## 2023-05-14 ENCOUNTER — Encounter (HOSPITAL_COMMUNITY): Payer: Self-pay

## 2023-05-14 ENCOUNTER — Ambulatory Visit (HOSPITAL_COMMUNITY)

## 2023-05-14 NOTE — Therapy (Signed)
 [facility] Outpatient Rehabilitation at [facility] [address] [place], [place], [address] Phone: [phone]   Fax:  [phone]  Patient Details  Name: Breanna Malone MRN: [id number] Date of Birth: 09-Jun-1980 Referring Provider:  No ref. provider found  Encounter Date: 05/14/2023  Called pt regarding no-show to Evaluation. Unable to leave voicemail.   Nelida Meuse, PT 05/14/2023, 10:12 AM  [facility] Outpatient Rehabilitation at [facility] [address] [place], [place], [address] Phone: [phone]   Fax:  [phone]

## 2023-06-11 ENCOUNTER — Ambulatory Visit (HOSPITAL_COMMUNITY)

## 2023-06-26 ENCOUNTER — Ambulatory Visit: Admitting: Women's Health

## 2023-06-27 ENCOUNTER — Emergency Department (HOSPITAL_COMMUNITY)

## 2023-06-27 ENCOUNTER — Other Ambulatory Visit: Payer: Self-pay

## 2023-06-27 ENCOUNTER — Encounter (HOSPITAL_COMMUNITY): Payer: Self-pay

## 2023-06-27 ENCOUNTER — Emergency Department (HOSPITAL_COMMUNITY)
Admission: EM | Admit: 2023-06-27 | Discharge: 2023-06-27 | Disposition: A | Discharge status: Home / Self Care | Attending: Emergency Medicine | Admitting: Emergency Medicine

## 2023-06-27 DIAGNOSIS — Z91040 Latex allergy status: Secondary | ICD-10-CM | POA: Diagnosis not present

## 2023-06-27 DIAGNOSIS — Z7951 Long term (current) use of inhaled steroids: Secondary | ICD-10-CM | POA: Insufficient documentation

## 2023-06-27 DIAGNOSIS — M549 Dorsalgia, unspecified: Secondary | ICD-10-CM | POA: Insufficient documentation

## 2023-06-27 DIAGNOSIS — R059 Cough, unspecified: Secondary | ICD-10-CM | POA: Diagnosis present

## 2023-06-27 DIAGNOSIS — R0989 Other specified symptoms and signs involving the circulatory and respiratory systems: Secondary | ICD-10-CM | POA: Diagnosis not present

## 2023-06-27 DIAGNOSIS — R0602 Shortness of breath: Secondary | ICD-10-CM | POA: Diagnosis not present

## 2023-06-27 DIAGNOSIS — R079 Chest pain, unspecified: Secondary | ICD-10-CM | POA: Diagnosis not present

## 2023-06-27 DIAGNOSIS — J45901 Unspecified asthma with (acute) exacerbation: Secondary | ICD-10-CM | POA: Diagnosis not present

## 2023-06-27 DIAGNOSIS — J45909 Unspecified asthma, uncomplicated: Secondary | ICD-10-CM | POA: Diagnosis not present

## 2023-06-27 DIAGNOSIS — M545 Low back pain, unspecified: Secondary | ICD-10-CM

## 2023-06-27 DIAGNOSIS — H6691 Otitis media, unspecified, right ear: Secondary | ICD-10-CM | POA: Diagnosis not present

## 2023-06-27 LAB — RESP PANEL BY RT-PCR (RSV, FLU A&B, COVID)  RVPGX2
Influenza A by PCR: NEGATIVE
Influenza B by PCR: NEGATIVE
Resp Syncytial Virus by PCR: NEGATIVE
SARS Coronavirus 2 by RT PCR: NEGATIVE

## 2023-06-27 MED ORDER — IPRATROPIUM-ALBUTEROL 0.5-2.5 (3) MG/3ML IN SOLN
3.0000 mL | Freq: Once | RESPIRATORY_TRACT | Status: AC
  Administered 2023-06-27: 3 mL via RESPIRATORY_TRACT
  Filled 2023-06-27: qty 3

## 2023-06-27 MED ORDER — FENTANYL CITRATE (PF) 100 MCG/2ML IJ SOLN
INTRAMUSCULAR | Status: AC
  Filled 2023-06-27: qty 2

## 2023-06-27 MED ORDER — ALBUTEROL SULFATE HFA 108 (90 BASE) MCG/ACT IN AERS: 1.0000 | INHALATION_SPRAY | Freq: Four times a day (QID) | RESPIRATORY_TRACT | 0 refills | Status: DC | PRN

## 2023-06-27 MED ORDER — CEFDINIR 300 MG PO CAPS: 300.0000 mg | ORAL_CAPSULE | Freq: Two times a day (BID) | ORAL | 0 refills | Status: AC

## 2023-06-27 MED ORDER — PREDNISONE 50 MG PO TABS
60.0000 mg | ORAL_TABLET | Freq: Once | ORAL | Status: AC
  Administered 2023-06-27: 60 mg via ORAL
  Filled 2023-06-27: qty 1

## 2023-06-27 MED ORDER — METHOCARBAMOL 750 MG PO TABS: 750.0000 mg | ORAL_TABLET | Freq: Three times a day (TID) | ORAL | 0 refills | Status: DC

## 2023-06-27 MED ORDER — PREDNISONE 10 MG PO TABS: 40.0000 mg | ORAL_TABLET | Freq: Every day | ORAL | 0 refills | Status: AC

## 2023-06-27 MED ORDER — KETOROLAC TROMETHAMINE 15 MG/ML IJ SOLN
15.0000 mg | Freq: Once | INTRAMUSCULAR | Status: AC
  Administered 2023-06-27: 15 mg via INTRAMUSCULAR
  Filled 2023-06-27: qty 1

## 2023-06-27 NOTE — ED Triage Notes (Signed)
 Pt arrived via POV c/o SOB, sneezing and congestion X 2 days. Pt endorses back and chest discomfort from sneezing so much.

## 2023-06-27 NOTE — Discharge Instructions (Addendum)
 Please take all antibiotics as directed.  Follow-up closely with your primary care doctor on an outpatient basis.  Return to emergency department immediately for any new or worsening symptoms.

## 2023-06-27 NOTE — ED Provider Notes (Signed)
 [place] EMERGENCY DEPARTMENT AT [facility] Provider Note   CSN: [id number] Arrival date & time: 06/27/23  1009     History  Chief Complaint  Patient presents with   Shortness of Breath    Breanna Malone is a 43 y.o. adult.  Patient is a 43 year old female who presents to the Emergency Department with a chief complaint of shortness of breath, sinus congestion, chest congestion, cough, right-sided ear pain which has been ongoing for approximate the past 2 days.  Patient notes that she does have a history of asthma.  Patient denies any associated dizziness, lightheadedness or syncope.  She denies any abdominal pain, nausea, vomiting, diarrhea.  She has had no lower extremity pain or edema, hemoptysis.   Shortness of Breath      Home Medications Prior to Admission medications   Medication Sig Start Date End Date Taking? Authorizing Provider  albuterol  (VENTOLIN  HFA) 108 (90 Base) MCG/ACT inhaler INHALE 1 OR 2 PUFFS INTO THE LUNGS EVERY 6 HOURS AS NEEDED FOR WHEEZING. 02/25/23   Zarwolo, Gloria, FNP  benzonatate  (TESSALON ) 100 MG capsule Take 1 capsule (100 mg total) by mouth every 8 (eight) hours. 01/26/23   Alissa April, MD  buPROPion  (WELLBUTRIN  XL) 150 MG 24 hr tablet Take 1 tablet (150 mg total) by mouth daily. 02/25/23   Zarwolo, Gloria, FNP  cyclobenzaprine  (FLEXERIL ) 5 MG tablet Take 1 tablet (5 mg total) by mouth at bedtime as needed for muscle spasms. 02/25/23   Zarwolo, Gloria, FNP  gabapentin  (NEURONTIN ) 300 MG capsule Take 1 capsule (300 mg total) by mouth 3 (three) times daily as needed. 05/01/23   Zelphia Higashi, MD  lidocaine  (LIDODERM ) 5 % Place 1 patch onto the skin daily. Remove & Discard patch within 12 hours or as directed by MD Patient not taking: Reported on 05/01/2023 04/10/23   Mordecai Applebaum, MD  medroxyPROGESTERone  Acetate 150 MG/ML SUSY INJECT 1 ML (150MG ) INTRAMUSCULARLY EVERY 3 MONTHS. 09/25/22   Javan Messing, NP  Multiple Vitamin  (MULTIVITAMIN) capsule Take 1 capsule by mouth daily.    [provider]  olmesartan  (BENICAR ) 20 MG tablet Take 1 tablet (20 mg total) by mouth daily. 02/25/23   Zarwolo, Gloria, FNP  ondansetron  (ZOFRAN -ODT) 4 MG disintegrating tablet 4mg  ODT q4 hours prn nausea/vomit 02/25/23   Zarwolo, Gloria, FNP  predniSONE  (DELTASONE ) 50 MG tablet Take 1 tablet (50 mg total) by mouth daily. 01/26/23   Alissa April, MD  rosuvastatin  (CRESTOR ) 40 MG tablet Take 1 tablet (40 mg total) by mouth daily. 02/25/23   Zarwolo, Gloria, FNP  UNABLE TO FIND RES-Q 105 max 1 daily.    [provider]      Allergies    Darvocet [propoxyphene n-acetaminophen ], Hydrocodone-acetaminophen , Lactose intolerance (gi), Other, Penicillins, Latex, and Percocet [oxycodone -acetaminophen ]    Review of Systems   Review of Systems  Respiratory:  Positive for shortness of breath.   All other systems reviewed and are negative.   Physical Exam Updated Vital Signs BP (!) 153/92 (BP Location: Left Arm)   Pulse 86   Temp 98 F (36.7 C) (Oral)   Resp 18   Ht 5\' 4"  (1.626 m)   Wt 105.2 kg   SpO2 96%   BMI 39.81 kg/m  Physical Exam Vitals and nursing note reviewed.  Constitutional:      Appearance: Normal appearance.  HENT:     Head: Normocephalic and atraumatic.     Right Ear: Tympanic membrane is erythematous and bulging.  Nose: Nose normal.     Mouth/Throat:     Mouth: Mucous membranes are moist.  Eyes:     Extraocular Movements: Extraocular movements intact.     Conjunctiva/sclera: Conjunctivae normal.     Pupils: Pupils are equal, round, and reactive to light.  Cardiovascular:     Rate and Rhythm: Normal rate and regular rhythm.     Pulses: Normal pulses.     Heart sounds: Normal heart sounds. No murmur heard.    No gallop.  Pulmonary:     Effort: Pulmonary effort is normal.     Breath sounds: Wheezing present. No decreased breath sounds, rhonchi or rales.  Chest:     Chest wall: No tenderness  or edema.  Abdominal:     General: Abdomen is flat. Bowel sounds are normal.     Palpations: Abdomen is soft.     Tenderness: There is no abdominal tenderness. There is no guarding.  Musculoskeletal:        General: Normal range of motion.     Cervical back: Normal range of motion and neck supple.     Right lower leg: No edema.     Left lower leg: No edema.  Skin:    General: Skin is warm and dry.     Findings: No rash.  Neurological:     General: No focal deficit present.     Mental Status: She is alert and oriented to person, place, and time. Mental status is at baseline.  Psychiatric:        Mood and Affect: Mood normal.        Behavior: Behavior normal.        Thought Content: Thought content normal.        Judgment: Judgment normal.     ED Results / Procedures / Treatments   Labs (all labs ordered are listed, but only abnormal results are displayed) Labs Reviewed  RESP PANEL BY RT-PCR (RSV, FLU A&B, COVID)  [id number]    EKG None  Radiology No results found.  Procedures Procedures    Medications Ordered in ED Medications  ipratropium-albuterol  (DUONEB) 0.5-2.5 (3) MG/3ML nebulizer solution 3 mL (has no administration in time range)  ketorolac  (TORADOL ) 15 MG/ML injection 15 mg (has no administration in time range)  predniSONE  (DELTASONE ) tablet 60 mg (has no administration in time range)    ED Course/ Medical Decision Making/ A&P                                 Medical Decision Making Amount and/or Complexity of Data Reviewed Radiology: ordered.  Risk Prescription drug management.   This patient presents to the ED for concern of cough, congestion, right-sided ear pain, shortness of breath differential diagnosis includes acute viral syndrome, asthma, seasonal allergies, musculoskeletal pain, ACS, pneumothorax, hemothorax, pneumonia    Additional history obtained:  Additional history obtained from none External records from outside source obtained and  reviewed including none   Lab Tests:  I Ordered, and personally interpreted labs.  The pertinent results include: Negative viral swab   Imaging Studies ordered:  I ordered imaging studies including chest x-ray I independently visualized and interpreted imaging which showed no acute cardiopulmonary process I agree with the radiologist interpretation   Medicines ordered and prescription drug management:  I ordered medication including albuterol , prednisone , cefdinir , Robaxin  for asthma, otitis media, back pain Reevaluation of the patient after these medicines showed that the patient  improved I have reviewed the patients home medicines and have made adjustments as needed   Problem List / ED Course:  Patient is doing well at this time and is stable for discharge home.  Discussed with patient that she was negative for COVID-19, influenza, RSV.  Chest x-ray demonstrated no signs of acute cardiopulmonary process.  Patient does note that she feels better after breathing treatment in the emergency department.  Will treat for otitis media at this time as well as for an asthma exacerbation.  EKG was otherwise unremarkable.  Do not suspect ACS at this time.  Have a low clinical suspicion for underlying etiology such as pulmonary embolus.  The need for close follow-up with her primary care doctor on an outpatient basis was discussed as well as strict turn precautions for any new or worsening symptoms.  Patient voiced understanding and had no additional questions.   Social Determinants of Health:  None           Final Clinical Impression(s) / ED Diagnoses Final diagnoses:  None    Rx / DC Orders ED Discharge Orders     None         Roselynn Connors, PA-C 06/27/23 1200    Jerilynn Montenegro, MD 06/29/23 2256

## 2023-07-14 ENCOUNTER — Emergency Department (HOSPITAL_COMMUNITY)

## 2023-07-14 ENCOUNTER — Emergency Department (HOSPITAL_COMMUNITY)
Admission: EM | Admit: 2023-07-14 | Discharge: 2023-07-14 | Disposition: A | Discharge status: Home / Self Care | Attending: Emergency Medicine | Admitting: Emergency Medicine

## 2023-07-14 ENCOUNTER — Encounter (HOSPITAL_COMMUNITY): Payer: Self-pay | Admitting: *Deleted

## 2023-07-14 ENCOUNTER — Other Ambulatory Visit: Payer: Self-pay

## 2023-07-14 DIAGNOSIS — R059 Cough, unspecified: Secondary | ICD-10-CM | POA: Diagnosis not present

## 2023-07-14 DIAGNOSIS — Z91040 Latex allergy status: Secondary | ICD-10-CM | POA: Insufficient documentation

## 2023-07-14 DIAGNOSIS — H6691 Otitis media, unspecified, right ear: Secondary | ICD-10-CM | POA: Diagnosis not present

## 2023-07-14 DIAGNOSIS — R0989 Other specified symptoms and signs involving the circulatory and respiratory systems: Secondary | ICD-10-CM | POA: Diagnosis not present

## 2023-07-14 DIAGNOSIS — J069 Acute upper respiratory infection, unspecified: Secondary | ICD-10-CM | POA: Diagnosis not present

## 2023-07-14 LAB — CBC WITH DIFFERENTIAL/PLATELET
Abs Immature Granulocytes: 0.03 10*3/uL (ref 0.00–0.07)
Basophils Absolute: 0 10*3/uL (ref 0.0–0.1)
Basophils Relative: 0 %
Eosinophils Absolute: 0.2 10*3/uL (ref 0.0–0.5)
Eosinophils Relative: 2 %
HCT: 38.7 % (ref 36.0–46.0)
Hemoglobin: 12.1 g/dL (ref 12.0–15.0)
Immature Granulocytes: 0 %
Lymphocytes Relative: 15 %
Lymphs Abs: 1.4 10*3/uL (ref 0.7–4.0)
MCH: 30.3 pg (ref 26.0–34.0)
MCHC: 31.3 g/dL (ref 30.0–36.0)
MCV: 97 fL (ref 80.0–100.0)
Monocytes Absolute: 0.6 10*3/uL (ref 0.1–1.0)
Monocytes Relative: 6 %
Neutro Abs: 7.1 10*3/uL (ref 1.7–7.7)
Neutrophils Relative %: 77 %
Platelets: 249 10*3/uL (ref 150–400)
RBC: 3.99 MIL/uL (ref 3.87–5.11)
RDW: 11.7 % (ref 11.5–15.5)
WBC: 9.3 10*3/uL (ref 4.0–10.5)
nRBC: 0 % (ref 0.0–0.2)

## 2023-07-14 LAB — RESP PANEL BY RT-PCR (RSV, FLU A&B, COVID)  RVPGX2
Influenza A by PCR: NEGATIVE
Influenza B by PCR: NEGATIVE
Resp Syncytial Virus by PCR: NEGATIVE
SARS Coronavirus 2 by RT PCR: NEGATIVE

## 2023-07-14 LAB — COMPREHENSIVE METABOLIC PANEL WITH GFR
ALT: 35 U/L (ref 0–44)
AST: 30 U/L (ref 15–41)
Albumin: 3.9 g/dL (ref 3.5–5.0)
Alkaline Phosphatase: 70 U/L (ref 38–126)
Anion gap: 12 (ref 5–15)
BUN: 5 mg/dL — ABNORMAL LOW (ref 6–20)
CO2: 26 mmol/L (ref 22–32)
Calcium: 8.9 mg/dL (ref 8.9–10.3)
Chloride: 103 mmol/L (ref 98–111)
Creatinine, Ser: 0.84 mg/dL (ref 0.44–1.00)
GFR, Estimated: >60 mL/min (ref >60)
Glucose, Bld: 173 mg/dL — ABNORMAL HIGH (ref 70–99)
Potassium: 3.8 mmol/L (ref 3.5–5.1)
Sodium: 141 mmol/L (ref 135–145)
Total Bilirubin: 0.8 mg/dL (ref 0.0–1.2)
Total Protein: 7.4 g/dL (ref 6.5–8.1)

## 2023-07-14 LAB — HCG, QUANTITATIVE, PREGNANCY: hCG, Beta Chain, Quant, S: <1 m[IU]/mL (ref <5)

## 2023-07-14 LAB — TROPONIN I (HIGH SENSITIVITY): Troponin I (High Sensitivity): 3 ng/L (ref <18)

## 2023-07-14 LAB — PREGNANCY, URINE: Preg Test, Ur: NEGATIVE

## 2023-07-14 MED ORDER — ALBUTEROL SULFATE HFA 108 (90 BASE) MCG/ACT IN AERS: 1.0000 | INHALATION_SPRAY | Freq: Four times a day (QID) | RESPIRATORY_TRACT | 0 refills | Status: AC | PRN

## 2023-07-14 MED ORDER — METHYLPREDNISOLONE SODIUM SUCC 125 MG IJ SOLR
125.0000 mg | Freq: Once | INTRAMUSCULAR | Status: AC
  Administered 2023-07-14: 125 mg via INTRAVENOUS
  Filled 2023-07-14: qty 2

## 2023-07-14 MED ORDER — IPRATROPIUM-ALBUTEROL 0.5-2.5 (3) MG/3ML IN SOLN
3.0000 mL | Freq: Once | RESPIRATORY_TRACT | Status: AC
  Administered 2023-07-14: 3 mL via RESPIRATORY_TRACT
  Filled 2023-07-14: qty 3

## 2023-07-14 MED ORDER — NAPROXEN 500 MG PO TABS: 500.0000 mg | ORAL_TABLET | Freq: Two times a day (BID) | ORAL | 0 refills | Status: DC

## 2023-07-14 MED ORDER — DOXYCYCLINE HYCLATE 100 MG PO CAPS: 100.0000 mg | ORAL_CAPSULE | Freq: Two times a day (BID) | ORAL | 0 refills | Status: DC

## 2023-07-14 MED ORDER — KETOROLAC TROMETHAMINE 15 MG/ML IJ SOLN
15.0000 mg | Freq: Once | INTRAMUSCULAR | Status: AC
  Administered 2023-07-14: 15 mg via INTRAVENOUS
  Filled 2023-07-14: qty 1

## 2023-07-14 NOTE — ED Provider Notes (Signed)
 [place] EMERGENCY DEPARTMENT AT [facility] Provider Note   CSN: [id number] Arrival date & time: 07/14/23  1051     History  Chief Complaint  Patient presents with   Cough    Breanna Malone is a 43 y.o. adult.  Patient is a 43 year old female who presents emergency department the chief complaint of cough, right-sided ear pain, nasal congestion, upper back pain.  Patient also admits to some intermittent shortness of breath and pain with cough.  She denies any abdominal pain, nausea, vomiting, diarrhea.  She denies any associated dizziness, lightheadedness or syncope.  She has had no recent falls or blunt trauma.   Cough      Home Medications Prior to Admission medications   Medication Sig Start Date End Date Taking? Authorizing Provider  albuterol  (VENTOLIN  HFA) 108 (90 Base) MCG/ACT inhaler Inhale 1-2 puffs into the lungs every 6 (six) hours as needed for wheezing or shortness of breath. 06/27/23   Roselynn Connors, PA-C  buPROPion  (WELLBUTRIN  XL) 150 MG 24 hr tablet Take 1 tablet (150 mg total) by mouth daily. 02/25/23   Zarwolo, Gloria, FNP  cyclobenzaprine  (FLEXERIL ) 5 MG tablet Take 1 tablet (5 mg total) by mouth at bedtime as needed for muscle spasms. 02/25/23   Zarwolo, Gloria, FNP  gabapentin  (NEURONTIN ) 300 MG capsule Take 1 capsule (300 mg total) by mouth 3 (three) times daily as needed. Patient not taking: Reported on 06/27/2023 05/01/23   Zelphia Higashi, MD  methocarbamol  (ROBAXIN -750) 750 MG tablet Take 1 tablet (750 mg total) by mouth 3 (three) times daily. 06/27/23   Roselynn Connors, PA-C  Multiple Vitamin (MULTIVITAMIN) capsule Take 1 capsule by mouth daily.    [provider]  olmesartan  (BENICAR ) 20 MG tablet Take 1 tablet (20 mg total) by mouth daily. 02/25/23   Zarwolo, Gloria, FNP  rosuvastatin  (CRESTOR ) 40 MG tablet Take 1 tablet (40 mg total) by mouth daily. 02/25/23   Zarwolo, Gloria, FNP  UNABLE TO FIND RES-Q 105 max 1 daily.     [provider]      Allergies    Darvocet [propoxyphene n-acetaminophen ], Hydrocodone-acetaminophen , Lactose intolerance (gi), Other, Penicillins, Latex, and Percocet [oxycodone -acetaminophen ]    Review of Systems   Review of Systems  Respiratory:  Positive for cough.   All other systems reviewed and are negative.   Physical Exam Updated Vital Signs BP (!) 149/97 (BP Location: Right Arm)   Pulse 99   Temp 98.9 F (37.2 C) (Oral)   Resp (!) 22   Ht 5\' 4"  (1.626 m)   Wt 99.8 kg   LMP 06/18/2023 (Approximate)   SpO2 98%   BMI 37.76 kg/m  Physical Exam Vitals and nursing note reviewed.  Constitutional:      Appearance: Normal appearance.  HENT:     Head: Normocephalic and atraumatic.     Ears:     Comments: Right TM with erythema and bulging    Nose: Nose normal.     Mouth/Throat:     Mouth: Mucous membranes are moist.  Eyes:     Extraocular Movements: Extraocular movements intact.     Conjunctiva/sclera: Conjunctivae normal.     Pupils: Pupils are equal, round, and reactive to light.  Cardiovascular:     Rate and Rhythm: Normal rate and regular rhythm.     Pulses: Normal pulses.     Heart sounds: Normal heart sounds. No murmur heard.    No gallop.  Pulmonary:     Effort:  Pulmonary effort is normal. No respiratory distress.     Breath sounds: No stridor. Wheezing present. No rhonchi or rales.  Abdominal:     General: Abdomen is flat. Bowel sounds are normal. There is no distension.     Palpations: Abdomen is soft. There is no mass.     Tenderness: There is no abdominal tenderness. There is no guarding.  Musculoskeletal:        General: Normal range of motion.     Cervical back: Normal range of motion and neck supple. No rigidity or tenderness.     Right lower leg: No edema.     Left lower leg: No edema.  Skin:    General: Skin is warm and dry.     Findings: No bruising or rash.  Neurological:     General: No focal deficit present.     Mental Status:  She is alert and oriented to person, place, and time. Mental status is at baseline.  Psychiatric:        Mood and Affect: Mood normal.        Behavior: Behavior normal.        Thought Content: Thought content normal.        Judgment: Judgment normal.     ED Results / Procedures / Treatments   Labs (all labs ordered are listed, but only abnormal results are displayed) Labs Reviewed  COMPREHENSIVE METABOLIC PANEL WITH GFR - Abnormal; Notable for the following components:      Result Value   Glucose, Bld 173 (*)    BUN 5 (*)    All other components within normal limits  RESP PANEL BY RT-PCR (RSV, FLU A&B, COVID)  RVPGX2  PREGNANCY, URINE  CBC WITH DIFFERENTIAL/PLATELET  HCG, QUANTITATIVE, PREGNANCY  TROPONIN I (HIGH SENSITIVITY)    EKG None  Radiology DG Chest Port 1 View Result Date: 07/14/2023 CLINICAL DATA:  cough, congestion EXAM: PORTABLE CHEST 1 VIEW COMPARISON:  Jun 27, 2023 FINDINGS: Evaluation is limited by positioning. The cardiomediastinal silhouette is unchanged and somewhat enlarged in contour, likely accentuated by technique. No pleural effusion. No pneumothorax. No acute pleuroparenchymal abnormality. IMPRESSION: No acute cardiopulmonary abnormality. Electronically Signed   By: Clancy Crimes M.D.   On: 07/14/2023 12:30    Procedures Procedures    Medications Ordered in ED Medications  ipratropium-albuterol  (DUONEB) 0.5-2.5 (3) MG/3ML nebulizer solution 3 mL (3 mLs Nebulization Given 07/14/23 1312)  methylPREDNISolone  sodium succinate (SOLU-MEDROL ) 125 mg/2 mL injection 125 mg (125 mg Intravenous Given 07/14/23 1317)  ketorolac  (TORADOL ) 15 MG/ML injection 15 mg (15 mg Intravenous Given 07/14/23 1320)    ED Course/ Medical Decision Making/ A&P                                 Medical Decision Making Amount and/or Complexity of Data Reviewed Labs: ordered. Radiology: ordered.  Risk Prescription drug management.   This patient presents to the ED for  concern of cough, congestion, right ear pain, back pain differential diagnosis includes acute viral syndrome, pneumonia, asthma exacerbation, bronchitis, otitis media, otitis externa, malignant otitis externa, perichondritis    Additional history obtained:  Additional history obtained from none External records from outside source obtained and reviewed including none   Lab Tests:  I Ordered, and personally interpreted labs.  The pertinent results include: No leukocytosis, no anemia, normal kidney function liver function, normal electrolytes, negative troponin   Imaging Studies ordered:  I ordered  imaging studies including chest x-ray I independently visualized and interpreted imaging which showed no acute cardiopulmonary process I agree with the radiologist interpretation   Medicines ordered and prescription drug management:  I ordered medication including Toradol , Solu-Medrol , DuoNeb for wheezing and pain Reevaluation of the patient after these medicines showed that the patient improved I have reviewed the patients home medicines and have made adjustments as needed   Problem List / ED Course:  Patient is doing well at this time and is stable for discharge home.  Discussed with patient that we will treat her for otitis media at this time.  She notes that she feels greatly improved after treatment in the emergency department.  Do not suspect ACS at this time.  Patient did have a negative troponin in the emergency department.  Pain is not exertional or positional in nature.  Do not suspect pericarditis or myocarditis.  Chest x-ray demonstrated no indication for pneumonia.  Patient did have wheezing on exam which has improved with treatment.  The need for close follow-up primary care doctor was discussed as well as strict turn precautions for any new or worsening symptoms.  Patient voiced understanding and had no additional questions.   Social Determinants of Health:  None            Final Clinical Impression(s) / ED Diagnoses Final diagnoses:  None    Rx / DC Orders ED Discharge Orders     None         Emmalene Hare 07/14/23 1530    Early Glisson, MD 07/14/23 [phone]

## 2023-07-14 NOTE — Discharge Instructions (Signed)
 Take all medications as directed.  Return to emergency department immediately for any new or worsening symptoms.

## 2023-07-14 NOTE — ED Triage Notes (Signed)
 Pt with cough NP per pt. And chest hurts with deep breath.  Nasal congestion. Denies fevers or chills.  Upper back pain. Right ear pain.

## 2023-07-22 ENCOUNTER — Telehealth (HOSPITAL_COMMUNITY): Payer: Self-pay

## 2023-07-22 ENCOUNTER — Ambulatory Visit (HOSPITAL_COMMUNITY): Attending: Student

## 2023-07-22 NOTE — Therapy (Deleted)
 OUTPATIENT PHYSICAL THERAPY THORACOLUMBAR EVALUATION   Patient Name: Breanna Malone MRN: [id number] DOB:1981-01-16, 43 y.o., adult Today's Date: 07/22/2023  END OF SESSION:   Past Medical History:  Diagnosis Date   Abnormal Pap smear    Arthritis    Asthma    Asthma    Bipolar 1 disorder (HCC)    Bone spur    rib   Bronchitis    BV (bacterial vaginosis) 10/08/2019   8/19 rx flagyl    Carpal tunnel syndrome    Cervical spine pain    Chest pain    Diabetes mellitus without complication (HCC)    Elevated cholesterol    High cholesterol    Hyperlipidemia    Hyperlipoproteinemia    Hypertension    Post traumatic stress disorder    Right ovarian cyst 10/31/2012   Complex right ovarian cyst seen 8/18 in ER need f/u US    Tendonitis    Vaginal Pap smear, abnormal    Past Surgical History:  Procedure Laterality Date   CARPAL TUNNEL RELEASE Right 06/28/2016   Procedure: RIGHT CARPAL TUNNEL RELEASE;  Surgeon: Darrin Emerald, MD;  Location: [facility];  Service: Orthopedics;  Laterality: Right;   HERNIA REPAIR     umbilical   RIB RESECTION Left 03/16/2021   Procedure: RESECTION OF TUMOR LEFT NINTH RIB;  Surgeon: Zelphia Higashi, MD;  Location: [facility];  Service: Thoracic;  Laterality: Left;   Patient Active Problem List   Diagnosis Date Noted   Acute laryngitis 01/22/2023   Left-sided thoracic back pain 12/17/2022   Acute bilateral low back pain without sciatica 12/07/2022   Acute pain of right knee 08/01/2022   Chronic left shoulder pain 07/04/2022   Irregular periods 06/13/2022   Pregnancy examination or test, negative result 06/13/2022   Abnormal urine color 06/13/2022   Umbilical hernia without obstruction and without gangrene 06/13/2022   Vaginal discharge 06/13/2022   MDD (major depressive disorder) 12/27/2021   Morbid obesity (HCC) 12/15/2021   Hyperlipidemia 12/15/2021   Hypertension 12/15/2021   Enchondroma of bone 12/27/2020   Screening for diabetes  mellitus 07/27/2020   Carpal tunnel syndrome of right wrist    Condyloma acuminatum in female 02/24/2014   Abnormal Pap smear of cervix 11/24/2012   Asthma 11/24/2012   Bipolar 1 disorder (HCC) 10/31/2012    PCP: Zarwolo, Gloria, FNP  REFERRING PROVIDER: Amedeo Jupiter, PA-C  REFERRING DIAG: M54.50,G89.29 (ICD-10-CM) - Chronic bilateral low back pain, unspecified whether sciatica present M54.9,G89.29 (ICD-10-CM) - Chronic mid back pain  Rationale for Evaluation and Treatment: Rehabilitation  THERAPY DIAG:  No diagnosis found.  ONSET DATE: ***  SUBJECTIVE:  SUBJECTIVE STATEMENT: ***  PERTINENT HISTORY:  ***  PAIN:  Are you having pain? {OPRCPAIN:27236}  PRECAUTIONS: None  RED FLAGS: {PT Red Flags:29287}   WEIGHT BEARING RESTRICTIONS: No  FALLS:  Has patient fallen in last 6 months? {fallsyesno:27318}  LIVING ENVIRONMENT: Lives with: {OP[facility] lives with:25569::"lives with their family"} Lives in: {Lives in:25570} Stairs: {opstairs:27293} Has following equipment at home: {Assistive devices:23999}  OCCUPATION: ***  PLOF: {PLOF:24004}  PATIENT GOALS: ***  NEXT MD VISIT: ***  OBJECTIVE:  Note: Objective measures were completed at Evaluation unless otherwise noted.  DIAGNOSTIC FINDINGS:  N/A  PATIENT SURVEYS:  Modified Oswestry ***   COGNITION: Overall cognitive status: {cognition:24006}     SENSATION: {sensation:27233}  MUSCLE LENGTH: Hamstrings: Right *** deg; Left *** deg Andy Bannister test: Right *** deg; Left *** deg  POSTURE: {posture:25561}  PALPATION: ***  LUMBAR ROM:   AROM eval  Flexion   Extension   Right lateral flexion   Left lateral flexion   Right rotation   Left rotation    (Blank rows = not tested)  LOWER EXTREMITY ROM:      {AROM/PROM:27142}  Right eval Left eval  Hip flexion    Hip extension    Hip abduction    Hip adduction    Hip internal rotation    Hip external rotation    Knee flexion    Knee extension    Ankle dorsiflexion    Ankle plantarflexion    Ankle inversion    Ankle eversion     (Blank rows = not tested)  LOWER EXTREMITY MMT:    MMT Right eval Left eval  Hip flexion    Hip extension    Hip abduction    Hip adduction    Hip internal rotation    Hip external rotation    Knee flexion    Knee extension    Ankle dorsiflexion    Ankle plantarflexion    Ankle inversion    Ankle eversion     (Blank rows = not tested)  LUMBAR SPECIAL TESTS:  {lumbar special test:25242}  FUNCTIONAL TESTS:  {Functional tests:24029}  GAIT: Distance walked: *** Assistive device utilized: {Assistive devices:23999} Level of assistance: {Levels of assistance:24026} Comments: ***  TREATMENT DATE:  07/22/23: PT Eval and HEP                                                                                                                                 PATIENT EDUCATION:  Education details: PT evaluation, objective findings, POC, Importance of HEP, Precautions, Clinic policies  Person educated: Patient Education method: Explanation and Demonstration Education comprehension: verbalized understanding and returned demonstration  HOME EXERCISE PROGRAM: ***  ASSESSMENT:  CLINICAL IMPRESSION: Patient is a 43 y.o. female who was seen today for physical therapy evaluation and treatment for M54.50,G89.29 (ICD-10-CM) - Chronic bilateral low back pain, unspecified whether sciatica present M54.9,G89.29 (ICD-10-CM) - Chronic mid back pain.   OBJECTIVE IMPAIRMENTS: {opptimpairments:25111}.  ACTIVITY LIMITATIONS: {activitylimitations:27494}  PARTICIPATION LIMITATIONS: {participationrestrictions:25113}  PERSONAL FACTORS: {Personal factors:25162} are also affecting patient's functional outcome.    REHAB POTENTIAL: {rehabpotential:25112}  CLINICAL DECISION MAKING: {clinical decision making:25114}  EVALUATION COMPLEXITY: {Evaluation complexity:25115}   GOALS: Goals reviewed with patient? No  SHORT TERM GOALS: Target date: 08/05/23 Patient will be independent with performance of HEP to demonstrate adequate self management of symptoms.  Baseline:  Goal status: INITIAL  2.   Patient will report at least a 25% improvement with function or pain overall since beginning PT. Baseline:  Goal status: INITIAL   LONG TERM GOALS: Target date: 09/02/23 Patient will improve Oswestry score by     points in order to improve self-perceived disability and overall function.  Baseline: Goal status: INITIAL   2.  Patient will improve     score by     in order to   .  Baseline: Goal status: INITIAL   3.  Patient will improve    test to   in order to improve LE strength and endurance to return to  . Baseline:  Goal status: INITIAL   4.  Patient will gain at least    deg of AROM in     in order to improve foot clearance for safe gait mechanics Baseline:  Goal status: INITIAL   5.  Patient will report overall 50% improvement since beginning PT. Baseline:  Goal status: INITIAL     PLAN:  PT FREQUENCY: 2x/week  PT DURATION: 6 weeks  PLANNED INTERVENTIONS: 97164- PT Re-evaluation, 97110-Therapeutic exercises, 97530- Therapeutic activity, 97112- Neuromuscular re-education, 97535- Self Care, 16109- Manual therapy, [phone]- Gait training, [phone]- Electrical stimulation (manual), Patient/Family education, Balance training, Stair training, Dry Needling, Joint mobilization, Spinal mobilization, and Moist heat.  PLAN FOR NEXT SESSION: ***   9:22 AM, 07/22/23 Marysue Sola, PT, DPT [facility] [place]

## 2023-07-22 NOTE — Telephone Encounter (Signed)
 Second missed evaluation. Called patient regarding missed evaluation on this date. No voicemail set up. Unable to leave message.    10:41 AM, 07/22/23 Marysue Sola, PT, DPT [facility] [place]

## 2023-07-24 ENCOUNTER — Ambulatory Visit: Admitting: Women's Health

## 2023-08-01 DIAGNOSIS — Z419 Encounter for procedure for purposes other than remedying health state, unspecified: Secondary | ICD-10-CM | POA: Diagnosis not present

## 2023-08-08 ENCOUNTER — Ambulatory Visit: Payer: No Typology Code available for payment source | Admitting: Family Medicine

## 2023-08-13 ENCOUNTER — Encounter: Payer: Self-pay | Admitting: Family Medicine

## 2023-08-13 ENCOUNTER — Telehealth: Payer: Self-pay

## 2023-08-13 NOTE — Telephone Encounter (Signed)
 Patient has missed three appointments in rolling 75-month period and four no shows since establishing care. Per no show policy, patient can be considered for dismissal.   Alternatively, registrars can contact patient to reschedule missed appointment and communicate NS policy. Please advise.

## 2023-08-20 ENCOUNTER — Encounter: Payer: Self-pay | Admitting: Family Medicine

## 2023-08-20 NOTE — Telephone Encounter (Signed)
 Chart updated and letter sent

## 2023-08-20 NOTE — Telephone Encounter (Signed)
 Please process

## 2023-08-26 NOTE — Therapy (Unsigned)
 OUTPATIENT PHYSICAL THERAPY THORACOLUMBAR EVALUATION   Patient Name: Breanna Malone MRN: [id number] DOB:04/23/1980, 43 y.o., adult Today's Date: 08/28/2023  END OF SESSION:  PT End of Session - 08/27/23 1437     Visit Number 1    Number of Visits 12    Date for PT Re-Evaluation 09/24/23    Authorization Type [place] Medicaid [employer]    Authorization Time Period Auth requested    PT Start Time 1438    PT Stop Time 1515    PT Time Calculation (min) 37 min    Activity Tolerance Patient tolerated treatment well    Behavior During Therapy WFL for tasks assessed/performed          Past Medical History:  Diagnosis Date   Abnormal Pap smear    Arthritis    Asthma    Asthma    Bipolar 1 disorder (HCC)    Bone spur    rib   Bronchitis    BV (bacterial vaginosis) 10/08/2019   8/19 rx flagyl    Carpal tunnel syndrome    Cervical spine pain    Chest pain    Diabetes mellitus without complication (HCC)    Elevated cholesterol    High cholesterol    Hyperlipidemia    Hyperlipoproteinemia    Hypertension    Post traumatic stress disorder    Right ovarian cyst 10/31/2012   Complex right ovarian cyst seen 8/18 in ER need f/u US    Tendonitis    Vaginal Pap smear, abnormal    Past Surgical History:  Procedure Laterality Date   CARPAL TUNNEL RELEASE Right 06/28/2016   Procedure: RIGHT CARPAL TUNNEL RELEASE;  Surgeon: Margrette Taft BRAVO, MD;  Location: [facility];  Service: Orthopedics;  Laterality: Right;   HERNIA REPAIR     umbilical   RIB RESECTION Left 03/16/2021   Procedure: RESECTION OF TUMOR LEFT NINTH RIB;  Surgeon: Kerrin Elspeth BROCKS, MD;  Location: [facility] OR;  Service: Thoracic;  Laterality: Left;   Patient Active Problem List   Diagnosis Date Noted   Acute laryngitis 01/22/2023   Left-sided thoracic back pain 12/17/2022   Acute bilateral low back pain without sciatica 12/07/2022   Acute pain of right knee 08/01/2022   Chronic left shoulder pain 07/04/2022   Irregular  periods 06/13/2022   Pregnancy examination or test, negative result 06/13/2022   Abnormal urine color 06/13/2022   Umbilical hernia without obstruction and without gangrene 06/13/2022   Vaginal discharge 06/13/2022   MDD (major depressive disorder) 12/27/2021   Morbid obesity (HCC) 12/15/2021   Hyperlipidemia 12/15/2021   Hypertension 12/15/2021   Enchondroma of bone 12/27/2020   Screening for diabetes mellitus 07/27/2020   Carpal tunnel syndrome of right wrist    Condyloma acuminatum in female 02/24/2014   Abnormal Pap smear of cervix 11/24/2012   Asthma 11/24/2012   Bipolar 1 disorder (HCC) 10/31/2012    PCP: Zarwolo, Gloria, FNP  REFERRING PROVIDER: Emiliano Leonce CROME, PA-C  REFERRING DIAG: M54.50,G89.29 (ICD-10-CM) - Chronic bilateral low back pain, unspecified whether sciatica present M54.9,G89.29 (ICD-10-CM) - Chronic mid back pain  Rationale for Evaluation and Treatment: Rehabilitation  THERAPY DIAG:  Pain in thoracic spine  Acute left flank pain  Muscle weakness (generalized)  Impaired functional mobility, balance, gait, and endurance  ONSET DATE: Chronic  SUBJECTIVE:  SUBJECTIVE STATEMENT: Patient late to session which limits evaluation. 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 Reports she has been having pain in that area recently. Reports biggest concern is this pain. Reports low back pain some but not big concern. Reports as sharp pain at times in L side. Patient reports MD thinks its just muscular pain.    PERTINENT HISTORY:  L Rib resection   PAIN:  Are you having pain? Yes: NPRS scale: 3-4/10 Pain location: L thoracic, L flank, Low back Pain description: Sharp, Ache Aggravating factors: Laying on L side, walking prolonged  distances Relieving factors: Motrin   PRECAUTIONS: None  RED FLAGS: None   WEIGHT BEARING RESTRICTIONS: No  FALLS:  Has patient fallen in last 6 months? No  LIVING ENVIRONMENT: Has following equipment at home: N/A  OCCUPATION: N/A  PLOF: Independent  PATIENT GOALS: To feel better, help me so I wont have to use a walker  NEXT MD VISIT: Unsure  OBJECTIVE:  Note: Objective measures were completed at Evaluation unless otherwise noted.  DIAGNOSTIC FINDINGS:  CLINICAL DATA:  Lower back pain for 2 months.   EXAM: LUMBAR SPINE - COMPLETE 5 VIEW   COMPARISON:  None Available.   FINDINGS: There is no evidence of lumbar spine fracture. Alignment is normal. Intervertebral disc spaces are maintained.   IMPRESSION: Negative.    CLINICAL DATA:  Back pain.   EXAM: THORACIC SPINE 3 VIEWS   COMPARISON:  None Available.   FINDINGS: There is no evidence of thoracic spine fracture. Alignment is normal. No other significant bone abnormalities are identified.   IMPRESSION: Negative.   PATIENT SURVEYS:  Modified Oswestry Low Back Pain Disability Questionnaire: 25 / 50 = 50.0 %  COGNITION: Overall cognitive status: Within functional limits for tasks assessed     SENSATION: WFL   POSTURE: rounded shoulders and forward head  PALPATION: TTP in L thoracic musculature, and L rib area, L flank and lumbar spine with CPA  THORACIC/LUMBAR ROM:   AROM eval  Flexion  (LUMBAR) To mid shin *   Extension (LUMBAR) WNL  Right lateral flexion (THORACIC, seated) 25% AVAIL, hard to isolate from lumbar  Left lateral flexion (THORACIC, seated) 25% AVAIL,  hard to isolate from lumbar  Right rotation 25% AVAIL  Left rotation 25% AVAIL   (Blank rows = not tested)   *=painful   LOWER EXTREMITY ROM:     Active  Right eval Left eval  Hip flexion    Hip extension    Hip abduction    Hip adduction    Hip internal rotation    Hip external rotation    Knee flexion    Knee  extension    Ankle dorsiflexion    Ankle plantarflexion    Ankle inversion    Ankle eversion     (Blank rows = not tested)  LOWER EXTREMITY MMT:    MMT Right eval Left eval  Hip flexion 4 4  Hip extension    Hip abduction    Hip adduction    Hip internal rotation    Hip external rotation    Knee flexion 4- 4-  Knee extension 4 4  Ankle dorsiflexion 5 5  Ankle plantarflexion    Ankle inversion    Ankle eversion     (Blank rows = not tested)  LUMBAR SPECIAL TESTS:    FUNCTIONAL TESTS:  30 seconds chair stand test: 16 STS, pain in knee caps  GAIT: Distance walked: 75 Assistive device utilized: None Level of assistance: Complete Independence Comments: Rounded shoulders, inc thor kyphosis, dec velocity, varying step lengths/widths at times, antalgic-like at times  TREATMENT DATE: 08/27/23: PT eval and HEP                                                                                                                                 PATIENT EDUCATION:  Education details: PT evaluation, objective findings, POC, Importance of HEP, Precautions, Clinic policies  Person educated: Patient Education method: Explanation and Demonstration Education comprehension: verbalized understanding and returned demonstration  HOME EXERCISE PROGRAM: Access Code: [email or web address] URL: [email or web address] Date: 08/27/2023 Prepared by: Rosaria Powell-Butler  Exercises - Supine Lower Trunk Rotation  - 2 x daily - 7 x weekly - 3 sets - 10 reps - Sidelying Thoracic Rotation with Open Book  - 2 x daily - 7 x weekly - 3 sets - 10 reps - Seated Thoracic Lumbar Extension  - 2 x daily - 7 x weekly - 3 sets - 10 reps  ASSESSMENT:  CLINICAL IMPRESSION: Patient is a 43 y.o. female who was seen today for physical therapy evaluation and treatment for M54.50,G89.29 (ICD-10-CM) - Chronic bilateral low back pain, unspecified whether sciatica present M54.9,G89.29 (ICD-10-CM) - Chronic mid back  pain. Patient arrives to physical therapy with reports of main concern being L thoracic pain most. Patient demonstrates decreased lumbar and thoracic ROM, decreased LE strength, has pain with palpation to L area versus R, poor posture, and decreased endurance/activity tolerance. Patient reports this area feels like when she had to have a resection at L rib area previously in 2023. Patient encouraged to contact primary MD, referring MD, or cardiologist to get this looked into. Pt given HEP to aid in pain reduction.  Patient will benefit from skilled physical therapy in order to address the above to improve function/QOL.    OBJECTIVE IMPAIRMENTS: Abnormal gait, decreased activity tolerance, decreased balance, decreased coordination, decreased endurance, decreased mobility, decreased ROM, decreased strength, improper body mechanics, postural dysfunction, and pain.   ACTIVITY LIMITATIONS: carrying, lifting, bending, sitting, standing, squatting, sleeping, stairs, transfers, and bed mobility  PARTICIPATION LIMITATIONS: meal prep, cleaning, laundry, driving, community activity, and yard work  PERSONAL FACTORS: Past/current experiences are also affecting patient's functional outcome.   REHAB POTENTIAL: Good  CLINICAL DECISION MAKING: Stable/uncomplicated  EVALUATION COMPLEXITY: Low   GOALS: Goals reviewed with patient? No  SHORT TERM GOALS: Target date: 09/10/23 Patient will be independent with performance of HEP to demonstrate adequate self management of symptoms.  Baseline:  Goal status: INITIAL  2.   Patient will report at least a 25% improvement with function or pain overall since beginning PT. Baseline:  Goal status: INITIAL   LONG TERM GOALS: Target date: 10/08/23  Patient will improve Modified Oswestry by at least 9 points in order to demonstrate improved self perception of function while meeting MCID.  Baseline:  Goal status: INITIAL  2.  Patient will improve R and L lateral  flexion to at least 50% of available range in order to demonstrate improved ROM of spine needed for functional tasks.  Baseline:  Goal status: INITIAL  3.  Patient will report pain rating of 2/10 or lower following daily ADL and iADLs to demonstrate improved activity tolerance. Baseline:  Goal status: INITIAL  PLAN:  PT FREQUENCY: 2x/week  PT DURATION: 6 weeks  PLANNED INTERVENTIONS: 97164- PT Re-evaluation, 97110-Therapeutic exercises, 97530- Therapeutic activity, [id number]- Neuromuscular re-education, 97535- Self Care, 02859- Manual therapy, [id number]- Gait training, [phone]- Electrical stimulation (manual), [id number]- Traction (mechanical), [phone] (1-2 muscles), [id number] (3+ muscles)- Dry Needling, Patient/Family education, Balance training, Stair training, Taping, Joint mobilization, Spinal mobilization, Cryotherapy, and Moist heat.  PLAN FOR NEXT SESSION: Review HEP and goals, assess UE MMT+ROM, progress thoracic/lumbar mobility, core and hip/LE strengthening    9:55 AM, 08/28/23 Rosaria Settler, PT, DPT [facility] Authorization Request  Visit Dx Codes:  M54.6 M62.81 Z74.09  Functional Tool Score:  Modified Oswestry Low Back Pain Disability Questionnaire: 25 / 50 = 50.0 % 30 seconds chair stand test: 16 STS  For all possible CPT codes, reference the Planned Interventions line above.     Check all conditions that are expected to impact treatment: {Conditions expected to impact treatment:None of these apply   If treatment provided at initial evaluation, no treatment charged due to lack of authorization.

## 2023-08-27 ENCOUNTER — Ambulatory Visit (HOSPITAL_COMMUNITY): Attending: Student

## 2023-08-27 ENCOUNTER — Encounter (HOSPITAL_COMMUNITY): Payer: Self-pay

## 2023-08-27 ENCOUNTER — Other Ambulatory Visit: Payer: Self-pay

## 2023-08-27 DIAGNOSIS — M549 Dorsalgia, unspecified: Secondary | ICD-10-CM | POA: Diagnosis not present

## 2023-08-27 DIAGNOSIS — M545 Low back pain, unspecified: Secondary | ICD-10-CM | POA: Insufficient documentation

## 2023-08-27 DIAGNOSIS — G8929 Other chronic pain: Secondary | ICD-10-CM | POA: Insufficient documentation

## 2023-08-27 DIAGNOSIS — Z7409 Other reduced mobility: Secondary | ICD-10-CM | POA: Insufficient documentation

## 2023-08-27 DIAGNOSIS — M546 Pain in thoracic spine: Secondary | ICD-10-CM | POA: Insufficient documentation

## 2023-08-27 DIAGNOSIS — M6281 Muscle weakness (generalized): Secondary | ICD-10-CM | POA: Insufficient documentation

## 2023-08-27 DIAGNOSIS — R109 Unspecified abdominal pain: Secondary | ICD-10-CM | POA: Insufficient documentation

## 2023-08-31 DIAGNOSIS — Z419 Encounter for procedure for purposes other than remedying health state, unspecified: Secondary | ICD-10-CM | POA: Diagnosis not present

## 2023-09-04 ENCOUNTER — Encounter (HOSPITAL_COMMUNITY): Payer: Self-pay

## 2023-09-04 ENCOUNTER — Ambulatory Visit (HOSPITAL_COMMUNITY)

## 2023-09-04 DIAGNOSIS — M545 Low back pain, unspecified: Secondary | ICD-10-CM | POA: Diagnosis not present

## 2023-09-04 DIAGNOSIS — G8929 Other chronic pain: Secondary | ICD-10-CM | POA: Diagnosis not present

## 2023-09-04 DIAGNOSIS — R109 Unspecified abdominal pain: Secondary | ICD-10-CM | POA: Diagnosis not present

## 2023-09-04 DIAGNOSIS — M546 Pain in thoracic spine: Secondary | ICD-10-CM | POA: Diagnosis not present

## 2023-09-04 DIAGNOSIS — Z7409 Other reduced mobility: Secondary | ICD-10-CM

## 2023-09-04 DIAGNOSIS — M6281 Muscle weakness (generalized): Secondary | ICD-10-CM | POA: Diagnosis not present

## 2023-09-04 DIAGNOSIS — M549 Dorsalgia, unspecified: Secondary | ICD-10-CM | POA: Diagnosis not present

## 2023-09-04 NOTE — Therapy (Signed)
 OUTPATIENT PHYSICAL THERAPY THORACOLUMBAR TREATMENT   Patient Name: Breanna Malone MRN: [id number] DOB:[birth year], 43 y.o., adult Today's Date: 09/04/2023  END OF SESSION:  PT End of Session - 09/04/23 1342     Visit Number 2    Number of Visits 12    Date for PT Re-Evaluation 09/24/23    Authorization Type [place] Medicaid [employer]    Authorization Time Period wellcare approved 10 visits from 08/27/23-10/26/23    PT Start Time 1345    PT Stop Time 1430    PT Time Calculation (min) 45 min    Activity Tolerance Patient tolerated treatment well    Behavior During Therapy [facility] for tasks assessed/performed          Past Medical History:  Diagnosis Date   Abnormal Pap smear    Arthritis    Asthma    Asthma    Bipolar 1 disorder (HCC)    Bone spur    rib   Bronchitis    BV (bacterial vaginosis) 10/08/2019   8/19 rx flagyl    Carpal tunnel syndrome    Cervical spine pain    Chest pain    Diabetes mellitus without complication (HCC)    Elevated cholesterol    High cholesterol    Hyperlipidemia    Hyperlipoproteinemia    Hypertension    Post traumatic stress disorder    Right ovarian cyst 10/31/2012   Complex right ovarian cyst seen 8/18 in ER need f/u US    Tendonitis    Vaginal Pap smear, abnormal    Past Surgical History:  Procedure Laterality Date   CARPAL TUNNEL RELEASE Right 06/28/2016   Procedure: RIGHT CARPAL TUNNEL RELEASE;  Surgeon: Margrette Taft BRAVO, MD;  Location: [facility];  Service: Orthopedics;  Laterality: Right;   HERNIA REPAIR     umbilical   RIB RESECTION Left 03/16/2021   Procedure: RESECTION OF TUMOR LEFT NINTH RIB;  Surgeon: Kerrin Elspeth BROCKS, MD;  Location: [facility] OR;  Service: Thoracic;  Laterality: Left;   Patient Active Problem List   Diagnosis Date Noted   Acute laryngitis 01/22/2023   Left-sided thoracic back pain 12/17/2022   Acute bilateral low back pain without sciatica 12/07/2022   Acute pain of right knee 08/01/2022   Chronic left shoulder  pain 07/04/2022   Irregular periods 06/13/2022   Pregnancy examination or test, negative result 06/13/2022   Abnormal urine color 06/13/2022   Umbilical hernia without obstruction and without gangrene 06/13/2022   Vaginal discharge 06/13/2022   MDD (major depressive disorder) 12/27/2021   Morbid obesity (HCC) 12/15/2021   Hyperlipidemia 12/15/2021   Hypertension 12/15/2021   Enchondroma of bone 12/27/2020   Screening for diabetes mellitus 07/27/2020   Carpal tunnel syndrome of right wrist    Condyloma acuminatum in female 02/24/2014   Abnormal Pap smear of cervix 11/24/2012   Asthma 11/24/2012   Bipolar 1 disorder (HCC) 10/31/2012    PCP: Zarwolo, Gloria, FNP  REFERRING PROVIDER: Emiliano Leonce CROME, PA-C  REFERRING DIAG: M54.50,G89.29 (ICD-10-CM) - Chronic bilateral low back pain, unspecified whether sciatica present M54.9,G89.29 (ICD-10-CM) - Chronic mid back pain  Rationale for Evaluation and Treatment: Rehabilitation  THERAPY DIAG:  Pain in thoracic spine  Muscle weakness (generalized)  Impaired functional mobility, balance, gait, and endurance  ONSET DATE: Chronic  SUBJECTIVE:  SUBJECTIVE STATEMENT: Reports pain as 7/10 today. Didn't call MD after last visit. HEP went okay at home.    EVAL: Patient late to session which limits evaluation. 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 Reports she has been having pain in that area recently. Reports biggest concern is this pain. Reports low back pain some but not big concern. Reports as sharp pain at times in L side. Patient reports MD thinks its just muscular pain.    PERTINENT HISTORY:  L Rib resection   PAIN:  Are you having pain? Yes: NPRS scale: 3-4/10 Pain location: L thoracic, L flank, Low  back Pain description: Sharp, Ache Aggravating factors: Laying on L side, walking prolonged distances Relieving factors: Motrin   PRECAUTIONS: None  RED FLAGS: None   WEIGHT BEARING RESTRICTIONS: No  FALLS:  Has patient fallen in last 6 months? No  LIVING ENVIRONMENT: Has following equipment at home: N/A  OCCUPATION: N/A  PLOF: Independent  PATIENT GOALS: To feel better, help me so I wont have to use a walker  NEXT MD VISIT: Unsure  OBJECTIVE:  Note: Objective measures were completed at Evaluation unless otherwise noted.  DIAGNOSTIC FINDINGS:  CLINICAL DATA:  Lower back pain for 2 months.   EXAM: LUMBAR SPINE - COMPLETE 5 VIEW   COMPARISON:  None Available.   FINDINGS: There is no evidence of lumbar spine fracture. Alignment is normal. Intervertebral disc spaces are maintained.   IMPRESSION: Negative.    CLINICAL DATA:  Back pain.   EXAM: THORACIC SPINE 3 VIEWS   COMPARISON:  None Available.   FINDINGS: There is no evidence of thoracic spine fracture. Alignment is normal. No other significant bone abnormalities are identified.   IMPRESSION: Negative.   PATIENT SURVEYS:  Modified Oswestry Low Back Pain Disability Questionnaire: 25 / 50 = 50.0 %  COGNITION: Overall cognitive status: Within functional limits for tasks assessed     SENSATION: WFL   POSTURE: rounded shoulders and forward head  PALPATION: TTP in L thoracic musculature, and L rib area, L flank and lumbar spine with CPA  THORACIC/LUMBAR ROM:   AROM eval  Flexion  (LUMBAR) To mid shin *   Extension (LUMBAR) WNL  Right lateral flexion (THORACIC, seated) 25% AVAIL, hard to isolate from lumbar  Left lateral flexion (THORACIC, seated) 25% AVAIL,  hard to isolate from lumbar  Right rotation 25% AVAIL  Left rotation 25% AVAIL   (Blank rows = not tested)   *=painful    LOWER EXTREMITY ROM:     Active  Right eval Left eval  Hip flexion    Hip extension    Hip abduction     Hip adduction    Hip internal rotation    Hip external rotation    Knee flexion    Knee extension    Ankle dorsiflexion    Ankle plantarflexion    Ankle inversion    Ankle eversion     (Blank rows = not tested)  LOWER EXTREMITY MMT:    MMT Right eval Left eval  Hip flexion 4 4  Hip extension    Hip abduction    Hip adduction    Hip internal rotation    Hip external rotation    Knee flexion 4- 4-  Knee extension 4 4  Ankle dorsiflexion 5 5  Ankle plantarflexion    Ankle inversion    Ankle eversion     (Blank rows = not tested)  LUMBAR SPECIAL TESTS:    FUNCTIONAL TESTS:  30 seconds chair stand test: 16 STS, pain in knee caps    GAIT: Distance walked: 75 Assistive device utilized: None Level of assistance: Complete Independence Comments: Rounded shoulders, inc thor kyphosis, dec velocity, varying step lengths/widths at times, antalgic-like at times  TREATMENT DATE: 09/04/23: Review of HEP and goals Quick assessment of UE Thoracic ext, 10x  + black foam roll, 10x LTR, 10x, verbal cues for control  Open Books, 10x each side  Cat/Cow, 10x2 Thread the needle, 10x each side Seated Forward lumbar/thoracic flexion, w/ blue physioball, 10x, 10 holds   08/27/23: PT eval and HEP                                                                                                                                 PATIENT EDUCATION:  Education details: PT evaluation, objective findings, POC, Importance of HEP, Precautions, Clinic policies  Person educated: Patient Education method: Explanation and Demonstration Education comprehension: verbalized understanding and returned demonstration  HOME EXERCISE PROGRAM: Access Code: [email or web address] URL: [email or web address] Date: 08/27/2023 Prepared by: Rosaria Powell-Butler  Exercises - Supine Lower Trunk Rotation  - 2 x daily - 7 x weekly - 3 sets - 10 reps - Sidelying Thoracic Rotation with Open Book  - 2 x daily - 7  x weekly - 3 sets - 10 reps - Seated Thoracic Lumbar Extension  - 2 x daily - 7 x weekly - 3 sets - 10 reps  Access Code: [email or web address] URL: [email or web address] Date: 09/04/2023 Prepared by: Rosaria Powell-Butler  Exercises - Cat Cow  - 2 x daily - 7 x weekly - 2 sets - 10 reps - Child's Pose with Thread the Needle  - 2 x daily - 7 x weekly - 2 sets - 10 reps  ASSESSMENT:  CLINICAL IMPRESSION: Review of HEP and goals. Patient agreeable with goals. Quick assessment of UE ROM and MMT unremarkable as all WFL. Patient required verbal cueing throughout review of HEP with form and control of speed. To further address thoracic/LBP mobility, patient performs cat/cow, and thread the needle. Requires verbal, visual, and tactile cueing but good carryover in session. Added to HEP. Rest break required afterwards due to fatigue. Patient reporting feeling hot and tired. BP taken 192/106. Patient reporting she did not take her BP medication this date. Encouraged to take medication when she gets home and if it got any higher to report to ER or urgent care. Patient will benefit from continued skilled physical therapy in order to address continued pain, ROM, strength, and endurance to improve function/QOL.     EVAL: Patient is a 43 y.o. female who was seen today for physical therapy evaluation and treatment for M54.50,G89.29 (ICD-10-CM) - Chronic bilateral low back pain, unspecified whether sciatica present M54.9,G89.29 (ICD-10-CM) - Chronic mid back pain. Patient arrives to physical therapy with reports of main concern being L thoracic pain most. Patient demonstrates decreased lumbar  and thoracic ROM, decreased LE strength, has pain with palpation to L area versus R, poor posture, and decreased endurance/activity tolerance. Patient reports this area feels like when she had to have a resection at L rib area previously in 2023. Patient encouraged to contact primary MD, referring MD, or cardiologist to get  this looked into. Pt given HEP to aid in pain reduction.  Patient will benefit from skilled physical therapy in order to address the above to improve function/QOL.    OBJECTIVE IMPAIRMENTS: Abnormal gait, decreased activity tolerance, decreased balance, decreased coordination, decreased endurance, decreased mobility, decreased ROM, decreased strength, improper body mechanics, postural dysfunction, and pain.   ACTIVITY LIMITATIONS: carrying, lifting, bending, sitting, standing, squatting, sleeping, stairs, transfers, and bed mobility  PARTICIPATION LIMITATIONS: meal prep, cleaning, laundry, driving, community activity, and yard work  PERSONAL FACTORS: Past/current experiences are also affecting patient's functional outcome.   REHAB POTENTIAL: Good  CLINICAL DECISION MAKING: Stable/uncomplicated  EVALUATION COMPLEXITY: Low   GOALS: Goals reviewed with patient? Yes  SHORT TERM GOALS: Target date: 09/10/23 Patient will be independent with performance of HEP to demonstrate adequate self management of symptoms.  Baseline:  Goal status: INITIAL  2.   Patient will report at least a 25% improvement with function or pain overall since beginning PT. Baseline:  Goal status: INITIAL   LONG TERM GOALS: Target date: 10/08/23  Patient will improve Modified Oswestry by at least 9 points in order to demonstrate improved self perception of function while meeting MCID.  Baseline:  Goal status: INITIAL  2.  Patient will improve R and L lateral flexion to at least 50% of available range in order to demonstrate improved ROM of spine needed for functional tasks.  Baseline:  Goal status: INITIAL  3.  Patient will report pain rating of 2/10 or lower following daily ADL and iADLs to demonstrate improved activity tolerance. Baseline:  Goal status: INITIAL  PLAN:  PT FREQUENCY: 2x/week  PT DURATION: 6 weeks  PLANNED INTERVENTIONS: 97164- PT Re-evaluation, 97110-Therapeutic exercises, 97530-  Therapeutic activity, [id number]- Neuromuscular re-education, 97535- Self Care, 02859- Manual therapy, [id number]- Gait training, [phone]- Electrical stimulation (manual), [id number]- Traction (mechanical), [phone] (1-2 muscles), 20561 (3+ muscles)- Dry Needling, Patient/Family education, Balance training, Stair training, Taping, Joint mobilization, Spinal mobilization, Cryotherapy, and Moist heat.  PLAN FOR NEXT SESSION: progress thoracic/lumbar mobility, intro core and hip/LE strengthening   2:33 PM, 09/04/23 Zettie Gootee Powell-Butler, PT, DPT [facility] [place]

## 2023-09-11 ENCOUNTER — Telehealth (HOSPITAL_COMMUNITY): Payer: Self-pay | Admitting: Physical Therapy

## 2023-09-11 ENCOUNTER — Encounter (HOSPITAL_COMMUNITY): Admitting: Physical Therapy

## 2023-09-11 NOTE — Telephone Encounter (Signed)
 First no show:   PT has a full voicebox unable to leave message.    Montie Metro, PT CLT [phone]

## 2023-09-13 ENCOUNTER — Encounter (HOSPITAL_COMMUNITY): Payer: Self-pay

## 2023-09-13 ENCOUNTER — Ambulatory Visit (HOSPITAL_COMMUNITY)

## 2023-09-13 DIAGNOSIS — M546 Pain in thoracic spine: Secondary | ICD-10-CM

## 2023-09-13 DIAGNOSIS — Z7409 Other reduced mobility: Secondary | ICD-10-CM

## 2023-09-13 DIAGNOSIS — R109 Unspecified abdominal pain: Secondary | ICD-10-CM | POA: Diagnosis not present

## 2023-09-13 DIAGNOSIS — M6281 Muscle weakness (generalized): Secondary | ICD-10-CM | POA: Diagnosis not present

## 2023-09-13 DIAGNOSIS — M549 Dorsalgia, unspecified: Secondary | ICD-10-CM | POA: Diagnosis not present

## 2023-09-13 DIAGNOSIS — M545 Low back pain, unspecified: Secondary | ICD-10-CM | POA: Diagnosis not present

## 2023-09-13 DIAGNOSIS — G8929 Other chronic pain: Secondary | ICD-10-CM | POA: Diagnosis not present

## 2023-09-13 NOTE — Therapy (Signed)
 OUTPATIENT PHYSICAL THERAPY THORACOLUMBAR TREATMENT   Patient Name: Breanna Malone MRN: [id number] DOB:1980-08-21, 43 y.o., adult Today's Date: 09/13/2023  END OF SESSION:  PT End of Session - 09/13/23 1443     Visit Number 3    Number of Visits 12    Date for PT Re-Evaluation 09/24/23    Authorization Type [place] Medicaid [employer]    Authorization Time Period wellcare approved 10 visits from 08/27/23-10/26/23    PT Start Time 1443   Late sign in   PT Stop Time 1516    PT Time Calculation (min) 33 min    Activity Tolerance Patient tolerated treatment well    Behavior During Therapy [facility] for tasks assessed/performed          Past Medical History:  Diagnosis Date   Abnormal Pap smear    Arthritis    Asthma    Asthma    Bipolar 1 disorder (HCC)    Bone spur    rib   Bronchitis    BV (bacterial vaginosis) 10/08/2019   8/19 rx flagyl    Carpal tunnel syndrome    Cervical spine pain    Chest pain    Diabetes mellitus without complication (HCC)    Elevated cholesterol    High cholesterol    Hyperlipidemia    Hyperlipoproteinemia    Hypertension    Post traumatic stress disorder    Right ovarian cyst 10/31/2012   Complex right ovarian cyst seen 8/18 in ER need f/u US    Tendonitis    Vaginal Pap smear, abnormal    Past Surgical History:  Procedure Laterality Date   CARPAL TUNNEL RELEASE Right 06/28/2016   Procedure: RIGHT CARPAL TUNNEL RELEASE;  Surgeon: Margrette Taft BRAVO, MD;  Location: [facility];  Service: Orthopedics;  Laterality: Right;   HERNIA REPAIR     umbilical   RIB RESECTION Left 03/16/2021   Procedure: RESECTION OF TUMOR LEFT NINTH RIB;  Surgeon: Kerrin Elspeth BROCKS, MD;  Location: [facility] OR;  Service: Thoracic;  Laterality: Left;   Patient Active Problem List   Diagnosis Date Noted   Acute laryngitis 01/22/2023   Left-sided thoracic back pain 12/17/2022   Acute bilateral low back pain without sciatica 12/07/2022   Acute pain of right knee 08/01/2022   Chronic  left shoulder pain 07/04/2022   Irregular periods 06/13/2022   Pregnancy examination or test, negative result 06/13/2022   Abnormal urine color 06/13/2022   Umbilical hernia without obstruction and without gangrene 06/13/2022   Vaginal discharge 06/13/2022   MDD (major depressive disorder) 12/27/2021   Morbid obesity (HCC) 12/15/2021   Hyperlipidemia 12/15/2021   Hypertension 12/15/2021   Enchondroma of bone 12/27/2020   Screening for diabetes mellitus 07/27/2020   Carpal tunnel syndrome of right wrist    Condyloma acuminatum in female 02/24/2014   Abnormal Pap smear of cervix 11/24/2012   Asthma 11/24/2012   Bipolar 1 disorder (HCC) 10/31/2012    PCP: Zarwolo, Gloria, FNP  REFERRING PROVIDER: Emiliano Leonce CROME, PA-C  REFERRING DIAG: M54.50,G89.29 (ICD-10-CM) - Chronic bilateral low back pain, unspecified whether sciatica present M54.9,G89.29 (ICD-10-CM) - Chronic mid back pain  Rationale for Evaluation and Treatment: Rehabilitation  THERAPY DIAG:  Pain in thoracic spine  Muscle weakness (generalized)  Impaired functional mobility, balance, gait, and endurance  ONSET DATE: Chronic  SUBJECTIVE:  SUBJECTIVE STATEMENT: Pain scale 5/10 Lt side of mid back.  Stated she tried to call the doctor but unable to get through.  Reports compliance with HEP, stated some are painful.  Pt has taken her BP meds regularly today.  EVAL: Patient late to session which limits evaluation. 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 Reports she has been having pain in that area recently. Reports biggest concern is this pain. Reports low back pain some but not big concern. Reports as sharp pain at times in L side. Patient reports MD thinks its just muscular pain.    PERTINENT  HISTORY:  L Rib resection   PAIN:  Are you having pain? Yes: NPRS scale: 5/10 Pain location: L thoracic, L flank, Low back Pain description: Sharp, Ache Aggravating factors: Laying on L side, walking prolonged distances Relieving factors: Motrin   PRECAUTIONS: None  RED FLAGS: None   WEIGHT BEARING RESTRICTIONS: No  FALLS:  Has patient fallen in last 6 months? No  LIVING ENVIRONMENT: Has following equipment at home: N/A  OCCUPATION: N/A  PLOF: Independent  PATIENT GOALS: To feel better, help me so I wont have to use a walker  NEXT MD VISIT: Unsure  OBJECTIVE:  Note: Objective measures were completed at Evaluation unless otherwise noted.  DIAGNOSTIC FINDINGS:  CLINICAL DATA:  Lower back pain for 2 months.   EXAM: LUMBAR SPINE - COMPLETE 5 VIEW   COMPARISON:  None Available.   FINDINGS: There is no evidence of lumbar spine fracture. Alignment is normal. Intervertebral disc spaces are maintained.   IMPRESSION: Negative.    CLINICAL DATA:  Back pain.   EXAM: THORACIC SPINE 3 VIEWS   COMPARISON:  None Available.   FINDINGS: There is no evidence of thoracic spine fracture. Alignment is normal. No other significant bone abnormalities are identified.   IMPRESSION: Negative.   PATIENT SURVEYS:  Modified Oswestry Low Back Pain Disability Questionnaire: 25 / 50 = 50.0 %  COGNITION: Overall cognitive status: Within functional limits for tasks assessed     SENSATION: WFL   POSTURE: rounded shoulders and forward head  PALPATION: TTP in L thoracic musculature, and L rib area, L flank and lumbar spine with CPA  THORACIC/LUMBAR ROM:   AROM eval  Flexion  (LUMBAR) To mid shin *   Extension (LUMBAR) WNL  Right lateral flexion (THORACIC, seated) 25% AVAIL, hard to isolate from lumbar  Left lateral flexion (THORACIC, seated) 25% AVAIL,  hard to isolate from lumbar  Right rotation 25% AVAIL  Left rotation 25% AVAIL   (Blank rows = not tested)    *=painful    LOWER EXTREMITY ROM:     Active  Right eval Left eval  Hip flexion    Hip extension    Hip abduction    Hip adduction    Hip internal rotation    Hip external rotation    Knee flexion    Knee extension    Ankle dorsiflexion    Ankle plantarflexion    Ankle inversion    Ankle eversion     (Blank rows = not tested)  LOWER EXTREMITY MMT:    MMT Right eval Left eval  Hip flexion 4 4  Hip extension    Hip abduction    Hip adduction    Hip internal rotation    Hip external rotation    Knee flexion 4- 4-  Knee extension 4 4  Ankle dorsiflexion 5 5  Ankle  plantarflexion    Ankle inversion    Ankle eversion     (Blank rows = not tested)  LUMBAR SPECIAL TESTS:    FUNCTIONAL TESTS:  30 seconds chair stand test: 16 STS, pain in knee caps    GAIT: Distance walked: 75 Assistive device utilized: None Level of assistance: Complete Independence Comments: Rounded shoulders, inc thor kyphosis, dec velocity, varying step lengths/widths at times, antalgic-like at times  TREATMENT DATE: 09/13/23: Therapist palpated incision, educated scar tissue and shown scar tissue massage to complete at home daily  Seated:  3D thoracic excursion 5x each direction BP 132/94 mmHg HR 82 bmp Seated RTB rows 15x Wback 10x   Standing:  RTB shoulder extension 10x  09/04/23: Review of HEP and goals Quick assessment of UE Thoracic ext, 10x  + black foam roll, 10x LTR, 10x, verbal cues for control  Open Books, 10x each side  Cat/Cow, 10x2 Thread the needle, 10x each side Seated Forward lumbar/thoracic flexion, w/ blue physioball, 10x, 10 holds   08/27/23: PT eval and HEP                                                                                                                                 PATIENT EDUCATION:  Education details: PT evaluation, objective findings, POC, Importance of HEP, Precautions, Clinic policies  Person educated: Patient Education method:  Explanation and Demonstration Education comprehension: verbalized understanding and returned demonstration  HOME EXERCISE PROGRAM: Access Code: [email or web address] URL: [email or web address] Date: 08/27/2023 Prepared by: Rosaria Powell-Butler  Exercises - Supine Lower Trunk Rotation  - 2 x daily - 7 x weekly - 3 sets - 10 reps - Sidelying Thoracic Rotation with Open Book  - 2 x daily - 7 x weekly - 3 sets - 10 reps - Seated Thoracic Lumbar Extension  - 2 x daily - 7 x weekly - 3 sets - 10 reps  Access Code: [email or web address] URL: [email or web address] Date: 09/04/2023 Prepared by: Rosaria Powell-Butler  Exercises - Cat Cow  - 2 x daily - 7 x weekly - 2 sets - 10 reps - Child's Pose with Thread the Needle  - 2 x daily - 7 x weekly - 2 sets - 10 reps  ASSESSMENT:  CLINICAL IMPRESSION: Session focus with thoracic mobility and education on importance of posture for pain control.  Pt reports she has taken her BP medication regularly with BP at 132/94 mmHg today.  Therapist palpated incision with noted myofascial restrictions.  Pt educated on benefits of scar tissue mobilization to begin at home.  Add 3D thoracic excursion for mobility and theraband for postural strengthening.  No reports of pain at EOS.      EVAL: Patient is a 43 y.o. female who was seen today for physical therapy evaluation and treatment for M54.50,G89.29 (ICD-10-CM) - Chronic bilateral low back pain, unspecified whether sciatica present M54.9,G89.29 (ICD-10-CM) - Chronic mid back pain. Patient arrives to physical therapy with reports of main  concern being L thoracic pain most. Patient demonstrates decreased lumbar and thoracic ROM, decreased LE strength, has pain with palpation to L area versus R, poor posture, and decreased endurance/activity tolerance. Patient reports this area feels like when she had to have a resection at L rib area previously in 2023. Patient encouraged to contact primary MD, referring MD, or  cardiologist to get this looked into. Pt given HEP to aid in pain reduction.  Patient will benefit from skilled physical therapy in order to address the above to improve function/QOL.    OBJECTIVE IMPAIRMENTS: Abnormal gait, decreased activity tolerance, decreased balance, decreased coordination, decreased endurance, decreased mobility, decreased ROM, decreased strength, improper body mechanics, postural dysfunction, and pain.   ACTIVITY LIMITATIONS: carrying, lifting, bending, sitting, standing, squatting, sleeping, stairs, transfers, and bed mobility  PARTICIPATION LIMITATIONS: meal prep, cleaning, laundry, driving, community activity, and yard work  PERSONAL FACTORS: Past/current experiences are also affecting patient's functional outcome.   REHAB POTENTIAL: Good  CLINICAL DECISION MAKING: Stable/uncomplicated  EVALUATION COMPLEXITY: Low   GOALS: Goals reviewed with patient? Yes  SHORT TERM GOALS: Target date: 09/10/23 Patient will be independent with performance of HEP to demonstrate adequate self management of symptoms.  Baseline:  Goal status: INITIAL  2.   Patient will report at least a 25% improvement with function or pain overall since beginning PT. Baseline:  Goal status: INITIAL   LONG TERM GOALS: Target date: 10/08/23  Patient will improve Modified Oswestry by at least 9 points in order to demonstrate improved self perception of function while meeting MCID.  Baseline:  Goal status: INITIAL  2.  Patient will improve R and L lateral flexion to at least 50% of available range in order to demonstrate improved ROM of spine needed for functional tasks.  Baseline:  Goal status: INITIAL  3.  Patient will report pain rating of 2/10 or lower following daily ADL and iADLs to demonstrate improved activity tolerance. Baseline:  Goal status: INITIAL  PLAN:  PT FREQUENCY: 2x/week  PT DURATION: 6 weeks  PLANNED INTERVENTIONS: 97164- PT Re-evaluation, 97110-Therapeutic  exercises, 97530- Therapeutic activity, [id number]- Neuromuscular re-education, 97535- Self Care, [id number]- Manual therapy, [id number]- Gait training, [phone]- Electrical stimulation (manual), [id number]- Traction (mechanical), [phone] (1-2 muscles), [id number] (3+ muscles)- Dry Needling, Patient/Family education, Balance training, Stair training, Taping, Joint mobilization, Spinal mobilization, Cryotherapy, and Moist heat.  PLAN FOR NEXT SESSION: progress thoracic/lumbar mobility, intro core and hip/LE strengthening  Augustin Mclean, LPTA/CLT; CBIS [phone]  3:24 PM, 09/13/23

## 2023-09-18 ENCOUNTER — Encounter (HOSPITAL_COMMUNITY)

## 2023-09-18 ENCOUNTER — Telehealth (HOSPITAL_COMMUNITY): Payer: Self-pay

## 2023-09-18 NOTE — Telephone Encounter (Signed)
 No show #2, called with no answer and voicemail box not set up. Letter sent for no show policy. Cancelled all remaing apts except for next apt.   Augustin Mclean, LPTA/CLT; WILLAIM [phone]

## 2023-09-24 ENCOUNTER — Ambulatory Visit: Payer: Self-pay | Admitting: *Deleted

## 2023-09-24 NOTE — Telephone Encounter (Signed)
 FYI Only or Action Required?: FYI only for provider.  Patient was last seen in primary care on 04/08/2023 by Zarwolo, Gloria, FNP.  Called Nurse Triage reporting Hand Injury.  Symptoms began several days ago.  Interventions attempted: Nothing.  Symptoms are: gradually worsening.  Triage Disposition: See HCP Within 4 Hours (Or PCP Triage)  Patient/caregiver understands and will follow disposition?: Unsure- UC advised - no PCP   Copied from CRM #[id number]. Topic: Clinical - Red Word Triage >> Sep 24, 2023  3:58 PM Elle L wrote: Red Word that prompted transfer to Nurse Triage: The patient is having back pain and her wrist and hand is swollen and sprained. Reason for Disposition  [1] Large swelling or bruise (> 2 inches or 5 cm) AND [2] can't use injured hand normally (e.g., make a fist, open fully, hold a glass of water)  Answer Assessment - Initial Assessment Questions 1. MECHANISM: How did the injury happen?     Moving and lifting 2. ONSET: When did the injury happen? (Minutes or hours ago)      Friday 3. APPEARANCE of INJURY: What does the injury look like?      Left hand/wrist- swelling 4. SEVERITY: Can you use the hand normally? Can you bend your fingers into a ball and then fully open them?     Painful to use hand- gets stiff at night 5. SIZE: For cuts, bruises, or swelling, ask: How large is it? (e.g., inches or centimeters;  entire hand or wrist)      Swelling- fat 6. PAIN: Is there pain? If Yes, ask: How bad is the pain?  (Scale 1-10; or mild, moderate, severe)     7/10 7. TETANUS: For any breaks in the skin, ask: When was the last tetanus booster?     na 8. OTHER SYMPTOMS: Do you have any other symptoms?      Back pain  Patient does not have PCP- she was released from care- Patient advised UC for her hand/wrist swelling, contact pharmacy for her refills, and NP appointment has been set up with new provider.  Protocols used: Hand and Wrist  Injury-A-AH

## 2023-09-25 ENCOUNTER — Encounter (HOSPITAL_COMMUNITY): Admitting: Physical Therapy

## 2023-09-25 ENCOUNTER — Telehealth (HOSPITAL_COMMUNITY): Payer: Self-pay | Admitting: Physical Therapy

## 2023-09-25 NOTE — Therapy (Signed)
 PHYSICAL THERAPY DISCHARGE SUMMARY  Visits from Start of Care: 3  Current functional level related to goals / functional outcomes: See last visit on 7/25   Remaining deficits: See last visit on 7/25   Education / Equipment: See last visit on 7/25   Patient agrees to discharge. Patient goals were not met. Patient is being discharged due to not returning since the last visit. Pt discharged per clinic's No Show policy.  2:40 PM, 09/25/23 Rosaria Settler, PT, DPT [facility] [place]

## 2023-09-25 NOTE — Telephone Encounter (Signed)
 Today was pt's 3rd NS.  Unable to reach by phone.  Pt is now discharged per NS policy.   Greig KATHEE Fuse, PTA/CLT [facility] Ph: [phone]

## 2023-09-27 ENCOUNTER — Encounter (HOSPITAL_COMMUNITY)

## 2023-09-27 DIAGNOSIS — M25532 Pain in left wrist: Secondary | ICD-10-CM | POA: Diagnosis not present

## 2023-09-27 DIAGNOSIS — Z885 Allergy status to narcotic agent status: Secondary | ICD-10-CM | POA: Diagnosis not present

## 2023-09-27 DIAGNOSIS — Z880 Allergy status to penicillin: Secondary | ICD-10-CM | POA: Diagnosis not present

## 2023-09-27 DIAGNOSIS — Z91040 Latex allergy status: Secondary | ICD-10-CM | POA: Diagnosis not present

## 2023-09-27 DIAGNOSIS — M779 Enthesopathy, unspecified: Secondary | ICD-10-CM | POA: Diagnosis not present

## 2023-09-27 DIAGNOSIS — M79632 Pain in left forearm: Secondary | ICD-10-CM | POA: Diagnosis not present

## 2023-09-27 DIAGNOSIS — I10 Essential (primary) hypertension: Secondary | ICD-10-CM | POA: Diagnosis not present

## 2023-09-27 DIAGNOSIS — E119 Type 2 diabetes mellitus without complications: Secondary | ICD-10-CM | POA: Diagnosis not present

## 2023-10-01 ENCOUNTER — Encounter (HOSPITAL_COMMUNITY)

## 2023-10-01 DIAGNOSIS — Z419 Encounter for procedure for purposes other than remedying health state, unspecified: Secondary | ICD-10-CM | POA: Diagnosis not present

## 2023-10-03 ENCOUNTER — Ambulatory Visit: Admitting: Family Medicine

## 2023-10-04 ENCOUNTER — Encounter (HOSPITAL_COMMUNITY)

## 2023-10-07 ENCOUNTER — Encounter (HOSPITAL_COMMUNITY): Admitting: Physical Therapy

## 2023-10-10 ENCOUNTER — Encounter (HOSPITAL_COMMUNITY)

## 2023-10-13 ENCOUNTER — Other Ambulatory Visit: Payer: Self-pay

## 2023-10-13 ENCOUNTER — Emergency Department (HOSPITAL_COMMUNITY)

## 2023-10-13 ENCOUNTER — Encounter (HOSPITAL_COMMUNITY): Payer: Self-pay | Admitting: Emergency Medicine

## 2023-10-13 ENCOUNTER — Emergency Department (HOSPITAL_COMMUNITY)
Admission: EM | Admit: 2023-10-13 | Discharge: 2023-10-13 | Disposition: A | Discharge status: Home / Self Care | Attending: Student | Admitting: Student

## 2023-10-13 DIAGNOSIS — Z91040 Latex allergy status: Secondary | ICD-10-CM | POA: Diagnosis not present

## 2023-10-13 DIAGNOSIS — J45909 Unspecified asthma, uncomplicated: Secondary | ICD-10-CM | POA: Diagnosis not present

## 2023-10-13 DIAGNOSIS — N644 Mastodynia: Secondary | ICD-10-CM | POA: Diagnosis not present

## 2023-10-13 DIAGNOSIS — I10 Essential (primary) hypertension: Secondary | ICD-10-CM | POA: Insufficient documentation

## 2023-10-13 DIAGNOSIS — R1031 Right lower quadrant pain: Secondary | ICD-10-CM | POA: Diagnosis not present

## 2023-10-13 DIAGNOSIS — R109 Unspecified abdominal pain: Secondary | ICD-10-CM | POA: Diagnosis not present

## 2023-10-13 DIAGNOSIS — R1033 Periumbilical pain: Secondary | ICD-10-CM | POA: Diagnosis not present

## 2023-10-13 DIAGNOSIS — D72829 Elevated white blood cell count, unspecified: Secondary | ICD-10-CM | POA: Insufficient documentation

## 2023-10-13 DIAGNOSIS — E876 Hypokalemia: Secondary | ICD-10-CM | POA: Insufficient documentation

## 2023-10-13 DIAGNOSIS — E041 Nontoxic single thyroid nodule: Secondary | ICD-10-CM | POA: Diagnosis not present

## 2023-10-13 DIAGNOSIS — E119 Type 2 diabetes mellitus without complications: Secondary | ICD-10-CM | POA: Diagnosis not present

## 2023-10-13 DIAGNOSIS — R0789 Other chest pain: Secondary | ICD-10-CM | POA: Diagnosis not present

## 2023-10-13 DIAGNOSIS — A419 Sepsis, unspecified organism: Secondary | ICD-10-CM | POA: Diagnosis not present

## 2023-10-13 DIAGNOSIS — J90 Pleural effusion, not elsewhere classified: Secondary | ICD-10-CM | POA: Diagnosis not present

## 2023-10-13 LAB — COMPREHENSIVE METABOLIC PANEL WITH GFR
ALT: 49 U/L — ABNORMAL HIGH (ref 0–44)
AST: 35 U/L (ref 15–41)
Albumin: 3.7 g/dL (ref 3.5–5.0)
Alkaline Phosphatase: 62 U/L (ref 38–126)
Anion gap: 8 (ref 5–15)
BUN: 9 mg/dL (ref 6–20)
CO2: 26 mmol/L (ref 22–32)
Calcium: 9.1 mg/dL (ref 8.9–10.3)
Chloride: 104 mmol/L (ref 98–111)
Creatinine, Ser: 0.74 mg/dL (ref 0.44–1.00)
GFR, Estimated: >60 mL/min (ref >60)
Glucose, Bld: 105 mg/dL — ABNORMAL HIGH (ref 70–99)
Potassium: 3.4 mmol/L — ABNORMAL LOW (ref 3.5–5.1)
Sodium: 138 mmol/L (ref 135–145)
Total Bilirubin: 0.6 mg/dL (ref 0.0–1.2)
Total Protein: 7 g/dL (ref 6.5–8.1)

## 2023-10-13 LAB — MAGNESIUM: Magnesium: 2 mg/dL (ref 1.7–2.4)

## 2023-10-13 LAB — CBC WITH DIFFERENTIAL/PLATELET
Abs Immature Granulocytes: 0.05 K/uL (ref 0.00–0.07)
Basophils Absolute: 0 K/uL (ref 0.0–0.1)
Basophils Relative: 0 %
Eosinophils Absolute: 0.1 K/uL (ref 0.0–0.5)
Eosinophils Relative: 1 %
HCT: 37.3 % (ref 36.0–46.0)
Hemoglobin: 12 g/dL (ref 12.0–15.0)
Immature Granulocytes: 0 %
Lymphocytes Relative: 41 %
Lymphs Abs: 5.1 K/uL — ABNORMAL HIGH (ref 0.7–4.0)
MCH: 31.3 pg (ref 26.0–34.0)
MCHC: 32.2 g/dL (ref 30.0–36.0)
MCV: 97.4 fL (ref 80.0–100.0)
Monocytes Absolute: 0.8 K/uL (ref 0.1–1.0)
Monocytes Relative: 7 %
Neutro Abs: 6.5 K/uL (ref 1.7–7.7)
Neutrophils Relative %: 51 %
Platelets: 328 K/uL (ref 150–400)
RBC: 3.83 MIL/uL — ABNORMAL LOW (ref 3.87–5.11)
RDW: 11.9 % (ref 11.5–15.5)
WBC: 12.6 K/uL — ABNORMAL HIGH (ref 4.0–10.5)
nRBC: 0 % (ref 0.0–0.2)

## 2023-10-13 LAB — URINALYSIS, ROUTINE W REFLEX MICROSCOPIC
Bacteria, UA: NONE SEEN
Bilirubin Urine: NEGATIVE
Glucose, UA: NEGATIVE mg/dL
Ketones, ur: NEGATIVE mg/dL
Leukocytes,Ua: NEGATIVE
Nitrite: NEGATIVE
Protein, ur: NEGATIVE mg/dL
Specific Gravity, Urine: 1.018 (ref 1.005–1.030)
pH: 6 (ref 5.0–8.0)

## 2023-10-13 LAB — LIPASE, BLOOD: Lipase: 33 U/L (ref 11–51)

## 2023-10-13 LAB — RESP PANEL BY RT-PCR (RSV, FLU A&B, COVID)  RVPGX2
Influenza A by PCR: NEGATIVE
Influenza B by PCR: NEGATIVE
Resp Syncytial Virus by PCR: NEGATIVE
SARS Coronavirus 2 by RT PCR: NEGATIVE

## 2023-10-13 LAB — HCG, QUANTITATIVE, PREGNANCY: hCG, Beta Chain, Quant, S: <1 m[IU]/mL (ref <5)

## 2023-10-13 MED ORDER — IOHEXOL 300 MG/ML  SOLN
100.0000 mL | Freq: Once | INTRAMUSCULAR | Status: AC | PRN
  Administered 2023-10-13: 100 mL via INTRAVENOUS

## 2023-10-13 MED ORDER — KETOROLAC TROMETHAMINE 15 MG/ML IJ SOLN
15.0000 mg | Freq: Once | INTRAMUSCULAR | Status: DC
  Filled 2023-10-13: qty 1

## 2023-10-13 MED ORDER — NAPROXEN 375 MG PO TABS: 375.0000 mg | ORAL_TABLET | Freq: Two times a day (BID) | ORAL | 0 refills | Status: DC

## 2023-10-13 MED ORDER — KETOROLAC TROMETHAMINE 15 MG/ML IJ SOLN
15.0000 mg | Freq: Once | INTRAMUSCULAR | Status: AC
  Administered 2023-10-13: 15 mg via INTRAVENOUS

## 2023-10-13 NOTE — ED Provider Notes (Signed)
 [place] EMERGENCY DEPARTMENT AT [facility] Provider Note  CSN: [id number] Arrival date & time: 10/13/23 1750  Chief Complaint(s) Abdominal Pain  HPI Breanna Malone is a 43 y.o. adult with PMH asthma, bipolar 1, left rib enchondroma status postsurgical removal, T2DM, HTN, HLD, ovarian cyst who presents emerged part for evaluation of multiple complaints including abdominal pain, back pain, rib pain and breast pain.  States that symptoms began 3 days ago after sexual intercourse.  Denies vaginal bleeding or vaginal discharge.  Denies current shortness of breath, headache, fever, nausea, vomiting or other systemic symptoms.   Past Medical History Past Medical History:  Diagnosis Date   Abnormal Pap smear    Arthritis    Asthma    Asthma    Bipolar 1 disorder (HCC)    Bone spur    rib   Bronchitis    BV (bacterial vaginosis) 10/08/2019   8/19 rx flagyl    Carpal tunnel syndrome    Cervical spine pain    Chest pain    Diabetes mellitus without complication (HCC)    Elevated cholesterol    High cholesterol    Hyperlipidemia    Hyperlipoproteinemia    Hypertension    Post traumatic stress disorder    Right ovarian cyst 10/31/2012   Complex right ovarian cyst seen 8/18 in ER need f/u US    Tendonitis    Vaginal Pap smear, abnormal    Patient Active Problem List   Diagnosis Date Noted   Acute laryngitis 01/22/2023   Left-sided thoracic back pain 12/17/2022   Acute bilateral low back pain without sciatica 12/07/2022   Acute pain of right knee 08/01/2022   Chronic left shoulder pain 07/04/2022   Irregular periods 06/13/2022   Pregnancy examination or test, negative result 06/13/2022   Abnormal urine color 06/13/2022   Umbilical hernia without obstruction and without gangrene 06/13/2022   Vaginal discharge 06/13/2022   MDD (major depressive disorder) 12/27/2021   Morbid obesity (HCC) 12/15/2021   Hyperlipidemia 12/15/2021   Hypertension 12/15/2021   Enchondroma  of bone 12/27/2020   Screening for diabetes mellitus 07/27/2020   Carpal tunnel syndrome of right wrist    Condyloma acuminatum in female 02/24/2014   Abnormal Pap smear of cervix 11/24/2012   Asthma 11/24/2012   Bipolar 1 disorder (HCC) 10/31/2012   Home Medication(s) Prior to Admission medications   Medication Sig Start Date End Date Taking? Authorizing Provider  naproxen  (NAPROSYN ) 375 MG tablet Take 1 tablet (375 mg total) by mouth 2 (two) times daily. 10/13/23  Yes Mariaisabel Bodiford, MD  albuterol  (VENTOLIN  HFA) 108 (90 Base) MCG/ACT inhaler Inhale 1-2 puffs into the lungs every 6 (six) hours as needed for wheezing or shortness of breath. 07/14/23   Daralene Lonni JONETTA, PA-C  buPROPion  (WELLBUTRIN  XL) 150 MG 24 hr tablet Take 1 tablet (150 mg total) by mouth daily. 02/25/23   Zarwolo, Gloria, FNP  cyclobenzaprine  (FLEXERIL ) 5 MG tablet Take 1 tablet (5 mg total) by mouth at bedtime as needed for muscle spasms. 02/25/23   Zarwolo, Gloria, FNP  doxycycline  (VIBRAMYCIN ) 100 MG capsule Take 1 capsule (100 mg total) by mouth 2 (two) times daily. 07/14/23   Daralene Lonni JONETTA, PA-C  gabapentin  (NEURONTIN ) 300 MG capsule Take 1 capsule (300 mg total) by mouth 3 (three) times daily as needed. Patient not taking: Reported on 06/27/2023 05/01/23   Kerrin Elspeth BROCKS, MD  methocarbamol  (ROBAXIN -750) 750 MG tablet Take 1 tablet (750 mg total) by mouth 3 (three) times  daily. 06/27/23   Daralene Lonni BIRCH, PA-C  Multiple Vitamin (MULTIVITAMIN) capsule Take 1 capsule by mouth daily.    [provider]  olmesartan  (BENICAR ) 20 MG tablet Take 1 tablet (20 mg total) by mouth daily. 02/25/23   Zarwolo, Gloria, FNP  rosuvastatin  (CRESTOR ) 40 MG tablet Take 1 tablet (40 mg total) by mouth daily. 02/25/23   Zarwolo, Gloria, FNP  UNABLE TO FIND RES-Q 105 max 1 daily.    [provider]                                                                                                                                     Past Surgical History Past Surgical History:  Procedure Laterality Date   CARPAL TUNNEL RELEASE Right 06/28/2016   Procedure: RIGHT CARPAL TUNNEL RELEASE;  Surgeon: Margrette Taft BRAVO, MD;  Location: [facility];  Service: Orthopedics;  Laterality: Right;   HERNIA REPAIR     umbilical   RIB RESECTION Left 03/16/2021   Procedure: RESECTION OF TUMOR LEFT NINTH RIB;  Surgeon: Kerrin Elspeth BROCKS, MD;  Location: [facility];  Service: Thoracic;  Laterality: Left;   Family History Family History  Problem Relation Age of Onset   Diabetes Mother    Hypertension Mother    Asthma Father    Bronchitis Daughter    Asthma Daughter    Asthma Daughter    Bronchitis Daughter    Asthma Son    Bronchitis Son    Asthma Son    Stroke Paternal Grandmother    Heart attack Paternal Grandmother    Cancer Paternal Grandmother    Cancer Cousin    Asthma Other    Bronchitis Other     Social History Social History   Tobacco Use   Smoking status: Never   Smokeless tobacco: Never  Vaping Use   Vaping status: Never Used  Substance Use Topics   Alcohol use: No   Drug use: No   Allergies Darvocet [propoxyphene n-acetaminophen ], Hydrocodone-acetaminophen , Lactose intolerance (gi), Other, Penicillins, Latex, and Percocet [oxycodone -acetaminophen ]  Review of Systems Review of Systems  Cardiovascular:  Positive for chest pain.  Gastrointestinal:  Positive for abdominal pain.    Physical Exam Vital Signs  I have reviewed the triage vital signs BP 136/83   Pulse 70   Temp 98.4 F (36.9 C) (Oral)   Resp 18   Ht 5' 4 (1.626 m)   Wt 103.4 kg   LMP 10/05/2023   SpO2 99%   BMI 39.14 kg/m   Physical Exam Constitutional:      General: She is not in acute distress.    Appearance: Normal appearance.  HENT:     Head: Normocephalic and atraumatic.     Nose: No congestion or rhinorrhea.  Eyes:     General:        Right eye: No discharge.        Left eye: No discharge.  Extraocular Movements: Extraocular movements intact.     Pupils: Pupils are equal, round, and reactive to light.  Cardiovascular:     Rate and Rhythm: Normal rate and regular rhythm.     Heart sounds: No murmur heard. Pulmonary:     Effort: No respiratory distress.     Breath sounds: No wheezing or rales.  Abdominal:     General: There is no distension.     Tenderness: There is abdominal tenderness.  Musculoskeletal:        General: Tenderness (Chest wall tenderness) present. Normal range of motion.     Cervical back: Normal range of motion.  Skin:    General: Skin is warm and dry.  Neurological:     General: No focal deficit present.     Mental Status: She is alert.     ED Results and Treatments Labs (all labs ordered are listed, but only abnormal results are displayed) Labs Reviewed  CBC WITH DIFFERENTIAL/PLATELET - Abnormal; Notable for the following components:      Result Value   WBC 12.6 (*)    RBC 3.83 (*)    Lymphs Abs 5.1 (*)    All other components within normal limits  COMPREHENSIVE METABOLIC PANEL WITH GFR - Abnormal; Notable for the following components:   Potassium 3.4 (*)    Glucose, Bld 105 (*)    ALT 49 (*)    All other components within normal limits  URINALYSIS, ROUTINE W REFLEX MICROSCOPIC - Abnormal; Notable for the following components:   APPearance HAZY (*)    Hgb urine dipstick SMALL (*)    All other components within normal limits  RESP PANEL BY RT-PCR (RSV, FLU A&B, COVID)  RVPGX2  LIPASE, BLOOD  HCG, QUANTITATIVE, PREGNANCY  MAGNESIUM                                                                                                                          Radiology CT CHEST ABDOMEN PELVIS W CONTRAST Result Date: 10/13/2023 CLINICAL DATA:  Sepsis. Bilateral breast pain, lower abdominal pain, umbilical and mid back pain for a few days. EXAM: CT CHEST, ABDOMEN, AND PELVIS WITH CONTRAST TECHNIQUE: Multidetector CT imaging of the chest, abdomen and  pelvis was performed following the standard protocol during bolus administration of intravenous contrast. RADIATION DOSE REDUCTION: This exam was performed according to the departmental dose-optimization program which includes automated exposure control, adjustment of the mA and/or kV according to patient size and/or use of iterative reconstruction technique. CONTRAST:  OMNIPAQUE  IOHEXOL  300 MG/ML  SOLN COMPARISON:  Chest radiograph 10/13/2023. CT chest 04/10/2023. CT abdomen pelvis 12/02/2022 FINDINGS: CT CHEST FINDINGS Cardiovascular: No significant vascular findings. Normal heart size. No pericardial effusion. Mediastinum/Nodes: Left thyroid nodule measuring 9 mm diameter. Due to size, no imaging follow-up is indicated. Esophagus is decompressed. No significant lymphadenopathy in the chest. Lungs/Pleura: Mild scarring in the lung bases. Postoperative changes in the left posterior chest wall with posterolateral tenth rib resection surgical clips  present. No airspace disease or consolidation in the lungs. No pleural effusion or pneumothorax. Musculoskeletal: No acute bony abnormalities. CT ABDOMEN PELVIS FINDINGS Hepatobiliary: No focal liver abnormality is seen. No gallstones, gallbladder wall thickening, or biliary dilatation. Pancreas: Unremarkable. No pancreatic ductal dilatation or surrounding inflammatory changes. Spleen: Normal in size without focal abnormality. Adrenals/Urinary Tract: Adrenal glands are unremarkable. Kidneys are normal, without renal calculi, focal lesion, or hydronephrosis. Bladder is unremarkable. Stomach/Bowel: Stomach is within normal limits. Appendix appears normal. No evidence of bowel wall thickening, distention, or inflammatory changes. Vascular/Lymphatic: No significant vascular findings are present. No enlarged abdominal or pelvic lymph nodes. Reproductive: Uterus and bilateral adnexa are unremarkable. Other: No abdominal wall hernia or abnormality. No abdominopelvic  ascites. Musculoskeletal: No acute or significant osseous findings. IMPRESSION: 1. No acute process demonstrated in the chest, abdomen, or pelvis. 2. Postoperative changes in the left posterolateral chest wall. 3. Mild aortic atherosclerosis. 4. Subcentimeter left thyroid nodule. Not clinically significant; no follow-up imaging recommended (ref: J Am Coll Radiol. 2015 Feb;12(2): 143-50). Electronically Signed   By: Elsie Gravely M.D.   On: 10/13/2023 20:24   DG Chest 2 View Result Date: 10/13/2023 CLINICAL DATA:  Chest wall pain.  Concern for fracture. EXAM: CHEST - 2 VIEW COMPARISON:  07/14/2023. FINDINGS: The heart size and mediastinal contours are within normal limits. There is a small left pleural effusion. No pneumothorax is seen. The right lung is clear. No acute fracture is identified. Surgical clips are noted in the right chest wall. IMPRESSION: Small left pleural effusion. Electronically Signed   By: Leita Birmingham M.D.   On: 10/13/2023 18:48    Pertinent labs & imaging results that were available during my care of the patient were reviewed by me and considered in my medical decision making (see MDM for details).  Medications Ordered in ED Medications  ketorolac  (TORADOL ) 15 MG/ML injection 15 mg (15 mg Intravenous Given 10/13/23 1919)  iohexol  (OMNIPAQUE ) 300 MG/ML solution 100 mL (100 mLs Intravenous Contrast Given 10/13/23 2004)                                                                                                                                     Procedures Procedures  (including critical care time)  Medical Decision Making / ED Course   This patient presents to the ED for concern of chest wall pain, abdominal pain, breast pain, this involves an extensive number of treatment options, and is a complaint that carries with it a high risk of complications and morbidity.  The differential diagnosis includes pneumonia, intra-abdominal infection, costochondritis, UTI, viral  illness  MDM: Patient seen emerged part for evaluation of multiple complaints described above.  Physical exam with reproducible tenderness over the posterior ribs in the left, tenderness in the suprapubic and periumbilical region.  Laboratory evaluation with a leukocytosis of 12.6, potassium 3.4 but is otherwise unremarkable.  COVID, flu, RSV negative.  Urinalysis without  evidence of infection.  Chest x-ray with a new pleural effusion.  Patient is having no exertional true chest pain and instead is having chest wall and rib pain at the site of her previous surgery and thus troponin and EKG testing was deferred.  CT chest on pelvis does not show an effusion or any acute pathology in the chest abdomen or pelvis.  Patient given Toradol  for pain control and on reevaluation she states all of her symptoms have resolved.  At this time with overall negative workup she does not meet inpatient criteria for admission will be discharged with outpatient follow-up.  Return precautions given which she voiced understanding she was discharged.   Additional history obtained:  -External records from outside source obtained and reviewed including: Chart review including previous notes, labs, imaging, consultation notes   Lab Tests: -I ordered, reviewed, and interpreted labs.   The pertinent results include:   Labs Reviewed  CBC WITH DIFFERENTIAL/PLATELET - Abnormal; Notable for the following components:      Result Value   WBC 12.6 (*)    RBC 3.83 (*)    Lymphs Abs 5.1 (*)    All other components within normal limits  COMPREHENSIVE METABOLIC PANEL WITH GFR - Abnormal; Notable for the following components:   Potassium 3.4 (*)    Glucose, Bld 105 (*)    ALT 49 (*)    All other components within normal limits  URINALYSIS, ROUTINE W REFLEX MICROSCOPIC - Abnormal; Notable for the following components:   APPearance HAZY (*)    Hgb urine dipstick SMALL (*)    All other components within normal limits  RESP PANEL  BY RT-PCR (RSV, FLU A&B, COVID)  RVPGX2  LIPASE, BLOOD  HCG, QUANTITATIVE, PREGNANCY  MAGNESIUM     Imaging Studies ordered: I ordered imaging studies including chest x-ray, CT chest and pelvis I independently visualized and interpreted imaging. I agree with the radiologist interpretation   Medicines ordered and prescription drug management: Meds ordered this encounter  Medications   DISCONTD: ketorolac  (TORADOL ) 15 MG/ML injection 15 mg   ketorolac  (TORADOL ) 15 MG/ML injection 15 mg   iohexol  (OMNIPAQUE ) 300 MG/ML solution 100 mL   naproxen  (NAPROSYN ) 375 MG tablet    Sig: Take 1 tablet (375 mg total) by mouth 2 (two) times daily.    Dispense:  20 tablet    Refill:  0    -I have reviewed the patients home medicines and have made adjustments as needed  Critical interventions none    Cardiac Monitoring: The patient was maintained on a cardiac monitor.  I personally viewed and interpreted the cardiac monitored which showed an underlying rhythm of: NSR  Social Determinants of Health:  Factors impacting patients care include: bnone   Reevaluation: After the interventions noted above, I reevaluated the patient and found that they have :improved  Co morbidities that complicate the patient evaluation  Past Medical History:  Diagnosis Date   Abnormal Pap smear    Arthritis    Asthma    Asthma    Bipolar 1 disorder (HCC)    Bone spur    rib   Bronchitis    BV (bacterial vaginosis) 10/08/2019   8/19 rx flagyl    Carpal tunnel syndrome    Cervical spine pain    Chest pain    Diabetes mellitus without complication (HCC)    Elevated cholesterol    High cholesterol    Hyperlipidemia    Hyperlipoproteinemia    Hypertension    Post  traumatic stress disorder    Right ovarian cyst 10/31/2012   Complex right ovarian cyst seen 8/18 in ER need f/u US    Tendonitis    Vaginal Pap smear, abnormal       Dispostion: I considered admission for this patient, but at this  time she does not meet inpatient criteria for admission will be discharged with outpatient follow-up     Final Clinical Impression(s) / ED Diagnoses Final diagnoses:  Abdominal pain, unspecified abdominal location  Chest wall pain     @PCDICTATION @    Albertina Dixon, MD 10/13/23 2213

## 2023-10-13 NOTE — ED Triage Notes (Signed)
 Pt c/o bilateral breast pain, lower abd pain, pain at umbilical and middle back pain for a few days.

## 2023-10-13 NOTE — ED Notes (Signed)
 Pt/family received d/c paperwork at this time. After going over the paperwork any questions, comments, or concerns were answered to the best of this nurse's knowledge. The pt/family verbally acknowledged the teachings/instructions.

## 2023-10-14 ENCOUNTER — Encounter (HOSPITAL_COMMUNITY)

## 2023-10-17 ENCOUNTER — Encounter (HOSPITAL_COMMUNITY)

## 2023-10-20 DIAGNOSIS — Z91040 Latex allergy status: Secondary | ICD-10-CM | POA: Diagnosis not present

## 2023-10-20 DIAGNOSIS — Z91011 Allergy to milk products: Secondary | ICD-10-CM | POA: Diagnosis not present

## 2023-10-20 DIAGNOSIS — Z885 Allergy status to narcotic agent status: Secondary | ICD-10-CM | POA: Diagnosis not present

## 2023-10-20 DIAGNOSIS — E119 Type 2 diabetes mellitus without complications: Secondary | ICD-10-CM | POA: Diagnosis not present

## 2023-10-20 DIAGNOSIS — I10 Essential (primary) hypertension: Secondary | ICD-10-CM | POA: Diagnosis not present

## 2023-10-20 DIAGNOSIS — Z880 Allergy status to penicillin: Secondary | ICD-10-CM | POA: Diagnosis not present

## 2023-10-20 DIAGNOSIS — S41002A Unspecified open wound of left shoulder, initial encounter: Secondary | ICD-10-CM | POA: Diagnosis not present

## 2023-10-20 DIAGNOSIS — W540XXA Bitten by dog, initial encounter: Secondary | ICD-10-CM | POA: Diagnosis not present

## 2023-10-20 DIAGNOSIS — S51852A Open bite of left forearm, initial encounter: Secondary | ICD-10-CM | POA: Diagnosis not present

## 2023-10-20 DIAGNOSIS — S59812A Other specified injuries left forearm, initial encounter: Secondary | ICD-10-CM | POA: Diagnosis not present

## 2023-10-20 DIAGNOSIS — Z886 Allergy status to analgesic agent status: Secondary | ICD-10-CM | POA: Diagnosis not present

## 2023-10-22 ENCOUNTER — Encounter (HOSPITAL_COMMUNITY): Admitting: Physical Therapy

## 2023-10-25 ENCOUNTER — Encounter (HOSPITAL_COMMUNITY)

## 2023-10-30 DIAGNOSIS — Z1159 Encounter for screening for other viral diseases: Secondary | ICD-10-CM | POA: Diagnosis not present

## 2023-10-30 DIAGNOSIS — F319 Bipolar disorder, unspecified: Secondary | ICD-10-CM | POA: Diagnosis not present

## 2023-10-30 DIAGNOSIS — J45909 Unspecified asthma, uncomplicated: Secondary | ICD-10-CM | POA: Diagnosis not present

## 2023-10-30 DIAGNOSIS — Z114 Encounter for screening for human immunodeficiency virus [HIV]: Secondary | ICD-10-CM | POA: Diagnosis not present

## 2023-10-30 DIAGNOSIS — F4310 Post-traumatic stress disorder, unspecified: Secondary | ICD-10-CM | POA: Diagnosis not present

## 2023-10-30 DIAGNOSIS — Z0001 Encounter for general adult medical examination with abnormal findings: Secondary | ICD-10-CM | POA: Diagnosis not present

## 2023-10-30 DIAGNOSIS — Z1239 Encounter for other screening for malignant neoplasm of breast: Secondary | ICD-10-CM | POA: Diagnosis not present

## 2023-10-30 DIAGNOSIS — Z1211 Encounter for screening for malignant neoplasm of colon: Secondary | ICD-10-CM | POA: Diagnosis not present

## 2023-10-30 DIAGNOSIS — E785 Hyperlipidemia, unspecified: Secondary | ICD-10-CM | POA: Diagnosis not present

## 2023-10-30 DIAGNOSIS — Z23 Encounter for immunization: Secondary | ICD-10-CM | POA: Diagnosis not present

## 2023-11-01 DIAGNOSIS — Z419 Encounter for procedure for purposes other than remedying health state, unspecified: Secondary | ICD-10-CM | POA: Diagnosis not present

## 2023-11-05 DIAGNOSIS — F3176 Bipolar disorder, in full remission, most recent episode depressed: Secondary | ICD-10-CM | POA: Diagnosis not present

## 2023-11-05 DIAGNOSIS — F79 Unspecified intellectual disabilities: Secondary | ICD-10-CM | POA: Diagnosis not present

## 2023-11-05 DIAGNOSIS — Z91414 Personal history of adult intimate partner abuse: Secondary | ICD-10-CM | POA: Diagnosis not present

## 2023-11-05 DIAGNOSIS — J45909 Unspecified asthma, uncomplicated: Secondary | ICD-10-CM | POA: Diagnosis not present

## 2023-11-05 DIAGNOSIS — R7309 Other abnormal glucose: Secondary | ICD-10-CM | POA: Diagnosis not present

## 2023-11-05 DIAGNOSIS — I10 Essential (primary) hypertension: Secondary | ICD-10-CM | POA: Diagnosis not present

## 2023-11-05 DIAGNOSIS — F819 Developmental disorder of scholastic skills, unspecified: Secondary | ICD-10-CM | POA: Diagnosis not present

## 2023-11-05 DIAGNOSIS — F4310 Post-traumatic stress disorder, unspecified: Secondary | ICD-10-CM | POA: Diagnosis not present

## 2023-11-05 DIAGNOSIS — Z62819 Personal history of unspecified abuse in childhood: Secondary | ICD-10-CM | POA: Diagnosis not present

## 2023-11-05 DIAGNOSIS — F840 Autistic disorder: Secondary | ICD-10-CM | POA: Diagnosis not present

## 2023-11-14 DIAGNOSIS — Z1231 Encounter for screening mammogram for malignant neoplasm of breast: Secondary | ICD-10-CM | POA: Diagnosis not present

## 2023-11-21 DIAGNOSIS — F819 Developmental disorder of scholastic skills, unspecified: Secondary | ICD-10-CM | POA: Diagnosis not present

## 2023-11-21 DIAGNOSIS — F3176 Bipolar disorder, in full remission, most recent episode depressed: Secondary | ICD-10-CM | POA: Diagnosis not present

## 2023-11-21 DIAGNOSIS — F840 Autistic disorder: Secondary | ICD-10-CM | POA: Diagnosis not present

## 2023-11-21 DIAGNOSIS — F4310 Post-traumatic stress disorder, unspecified: Secondary | ICD-10-CM | POA: Diagnosis not present

## 2023-11-21 DIAGNOSIS — I10 Essential (primary) hypertension: Secondary | ICD-10-CM | POA: Diagnosis not present

## 2023-11-21 DIAGNOSIS — F79 Unspecified intellectual disabilities: Secondary | ICD-10-CM | POA: Diagnosis not present

## 2023-11-21 DIAGNOSIS — S069X1S Unspecified intracranial injury with loss of consciousness of 30 minutes or less, sequela: Secondary | ICD-10-CM | POA: Diagnosis not present

## 2023-11-21 DIAGNOSIS — Z6841 Body Mass Index (BMI) 40.0 and over, adult: Secondary | ICD-10-CM | POA: Diagnosis not present

## 2023-12-23 ENCOUNTER — Encounter: Payer: Self-pay | Admitting: Radiology

## 2023-12-24 ENCOUNTER — Other Ambulatory Visit: Payer: Self-pay

## 2023-12-24 ENCOUNTER — Encounter (HOSPITAL_COMMUNITY): Payer: Self-pay | Admitting: Emergency Medicine

## 2023-12-24 ENCOUNTER — Emergency Department (HOSPITAL_COMMUNITY)
Admission: EM | Admit: 2023-12-24 | Discharge: 2023-12-25 | Discharge status: Against Medical Advice | Attending: Emergency Medicine | Admitting: Emergency Medicine

## 2023-12-24 DIAGNOSIS — M79604 Pain in right leg: Secondary | ICD-10-CM | POA: Insufficient documentation

## 2023-12-24 DIAGNOSIS — Z5321 Procedure and treatment not carried out due to patient leaving prior to being seen by health care provider: Secondary | ICD-10-CM | POA: Insufficient documentation

## 2023-12-24 NOTE — ED Triage Notes (Signed)
 Pt arrived POV c/o right leg pain that started at her knee and radiates up to her thigh. States she was scheduled to have fluid drawn off her knee 2 weeks ago but the appointment was cancelled.

## 2023-12-25 NOTE — ED Provider Notes (Signed)
 Emergency Department Provider Note    ED Clinical Impression   Final diagnoses:  Right leg pain (Primary)    ED Assessment/Plan    Condition: Stable Disposition: Discharge  This chart has been completed using Dragon Medical Dictation software, and while attempts have been made to ensure accuracy, certain words and phrases may not be transcribed as intended.   History   Chief Complaint  Patient presents with   Leg Pain   Leg Swelling   HPI  Breanna Malone is a 43 y.o. female  who presents today to the  emergency department complaining of right knee pain.  Patient states that she has had chronic knee pain and has seen the orthopedist for this before.  States that she has difficulty going up to 39 steps up to her apartment.  She never had leg pain this severe until she moved.  Recently had fluid drained off the knee.  No fever cough or chills.  Reports pain to the back of the knee in the medial aspect of flexion at the knee.  No overlying erythema fever.  Denies fall or injury.  No buckling.  No other known aggravating relieving factors.    Allergies: is allergic to lactose, hydrocodone-acetaminophen , latex, other, oxycodone -acetaminophen , penicillins, and propoxyphene n-acetaminophen . Medications: has a current medication list which includes the following long-term medication(s): lamotrigine, olmesartan -hydrochlorothiazide , and sertraline. PMHx:  has a past medical history of Diabetes mellitus (CMS-HCC) and Hypertension. PSHx:  has a past surgical history that includes Tumor removal; Carpal tunnel release; and Hernia repair. SocHx:  reports that she has never smoked. She has never been exposed to tobacco smoke. She has never used smokeless tobacco. She reports that she does not drink alcohol and does not use drugs. Allergies, Medications, Medical, Surgical, and Social History were reviewed as documented above.  Social Drivers of Health with Concerns   Alcohol Use: Not on  file  Physical Activity: Not on file  Stress: Not on file  Substance Use: Not on file (09/27/2023)  Intimate Partner Violence: Not on file  Social Connections: Not on file  Financial Resource Strain: Not on file  Health Literacy: Not on file  Internet Connectivity: Not on file    Review Of Systems  Review of Systems  Constitutional:  Negative for chills and fever.  HENT:  Negative for ear pain.   Respiratory:  Negative for chest tightness and shortness of breath.   Cardiovascular:  Negative for chest pain.  Gastrointestinal:  Negative for abdominal pain.  Genitourinary:  Negative for flank pain.  Musculoskeletal:  Negative for back pain.  Skin:  Negative for rash and wound.  Neurological:  Negative for weakness.  Psychiatric/Behavioral:  Negative for suicidal ideas.   All other systems reviewed and are negative.   Physical Exam   BP 139/92   Pulse 70   Temp 36.4 C (97.5 F) (Oral)   Resp 16   Ht 162.6 cm (5' 4)   Wt (!) 107.5 kg (237 lb 0.6 oz)   SpO2 98%   BMI 40.69 kg/m   Physical Exam Vitals and nursing note reviewed.  Constitutional:      Appearance: She is not ill-appearing, toxic-appearing or diaphoretic.  HENT:     Head: Normocephalic.     Nose: Nose normal.     Mouth/Throat:     Mouth: Mucous membranes are moist.  Eyes:     Conjunctiva/sclera: Conjunctivae normal.  Cardiovascular:     Rate and Rhythm: Normal rate and regular rhythm.  Pulses: Normal pulses.     Heart sounds: Normal heart sounds.  Pulmonary:     Effort: Pulmonary effort is normal.     Breath sounds: Normal breath sounds.  Abdominal:     General: There is no distension.     Palpations: Abdomen is soft.  Musculoskeletal:        General: No deformity.     Cervical back: Neck supple.  Skin:    General: Skin is warm and dry.     Capillary Refill: Capillary refill takes less than 2 seconds.  Neurological:     General: No focal deficit present.     Mental Status: She is alert and  oriented to person, place, and time. Mental status is at baseline.  Psychiatric:        Mood and Affect: Mood normal.        Behavior: Behavior normal.        Thought Content: Thought content normal.        Judgment: Judgment normal.     ED Course  Medical Decision Making Differential diagnose include sprain, strain, DVT, thrombophlebitis, cellulitis, knee effusion STI and others.  Imaging to look for signs of acute fracture.  But my suspicion is low for fracture and septic joint.  Likely knee strain with associated joint effusion that is inflammatory. Follow-up with primary care doctor for additional evaluation.  Appointment with orthopedist    Risk Prescription drug management.     Procedures   No results found for this visit on 12/24/23 (from the past 4464 hours).        Alla Cleola Coffin, MD 01/19/24 [phone]

## 2024-01-05 ENCOUNTER — Other Ambulatory Visit: Payer: Self-pay

## 2024-01-05 ENCOUNTER — Emergency Department (HOSPITAL_COMMUNITY)
Admission: EM | Admit: 2024-01-05 | Discharge: 2024-01-05 | Disposition: A | Discharge status: Home / Self Care | Attending: Emergency Medicine | Admitting: Emergency Medicine

## 2024-01-05 ENCOUNTER — Emergency Department (HOSPITAL_COMMUNITY)

## 2024-01-05 DIAGNOSIS — M7989 Other specified soft tissue disorders: Secondary | ICD-10-CM | POA: Insufficient documentation

## 2024-01-05 DIAGNOSIS — M79661 Pain in right lower leg: Secondary | ICD-10-CM | POA: Insufficient documentation

## 2024-01-05 DIAGNOSIS — M25561 Pain in right knee: Secondary | ICD-10-CM | POA: Insufficient documentation

## 2024-01-05 DIAGNOSIS — Z91040 Latex allergy status: Secondary | ICD-10-CM | POA: Insufficient documentation

## 2024-01-05 DIAGNOSIS — R7989 Other specified abnormal findings of blood chemistry: Secondary | ICD-10-CM | POA: Insufficient documentation

## 2024-01-05 LAB — CBC WITH DIFFERENTIAL/PLATELET
Abs Immature Granulocytes: 0.02 K/uL (ref 0.00–0.07)
Basophils Absolute: 0 K/uL (ref 0.0–0.1)
Basophils Relative: 0 %
Eosinophils Absolute: 0.1 K/uL (ref 0.0–0.5)
Eosinophils Relative: 1 %
HCT: 35.8 % — ABNORMAL LOW (ref 36.0–46.0)
Hemoglobin: 11.9 g/dL — ABNORMAL LOW (ref 12.0–15.0)
Immature Granulocytes: 0 %
Lymphocytes Relative: 43 %
Lymphs Abs: 4 K/uL (ref 0.7–4.0)
MCH: 31.8 pg (ref 26.0–34.0)
MCHC: 33.2 g/dL (ref 30.0–36.0)
MCV: 95.7 fL (ref 80.0–100.0)
Monocytes Absolute: 0.6 K/uL (ref 0.1–1.0)
Monocytes Relative: 6 %
Neutro Abs: 4.7 K/uL (ref 1.7–7.7)
Neutrophils Relative %: 50 %
Platelets: 293 K/uL (ref 150–400)
RBC: 3.74 MIL/uL — ABNORMAL LOW (ref 3.87–5.11)
RDW: 11.9 % (ref 11.5–15.5)
WBC: 9.5 K/uL (ref 4.0–10.5)
nRBC: 0 % (ref 0.0–0.2)

## 2024-01-05 LAB — BASIC METABOLIC PANEL WITH GFR
Anion gap: 9 (ref 5–15)
BUN: 7 mg/dL (ref 6–20)
CO2: 26 mmol/L (ref 22–32)
Calcium: 9 mg/dL (ref 8.9–10.3)
Chloride: 103 mmol/L (ref 98–111)
Creatinine, Ser: 0.84 mg/dL (ref 0.44–1.00)
GFR, Estimated: >60 mL/min (ref >60)
Glucose, Bld: 116 mg/dL — ABNORMAL HIGH (ref 70–99)
Potassium: 3.4 mmol/L — ABNORMAL LOW (ref 3.5–5.1)
Sodium: 138 mmol/L (ref 135–145)

## 2024-01-05 LAB — D-DIMER, QUANTITATIVE: D-Dimer, Quant: 0.84 ug{FEU}/mL — ABNORMAL HIGH (ref 0.00–0.50)

## 2024-01-05 MED ORDER — ENOXAPARIN SODIUM 300 MG/3ML IJ SOLN
1.5000 mg/kg | Freq: Once | INTRAMUSCULAR | Status: AC
  Administered 2024-01-05: 160 mg via SUBCUTANEOUS
  Filled 2024-01-05: qty 1.6

## 2024-01-05 NOTE — ED Notes (Addendum)
 Pt ambulates to the bathroom with a limp to void.

## 2024-01-05 NOTE — Discharge Instructions (Addendum)
 Return for ultrasound of your right leg.  The radiology department will call you to schedule the study.

## 2024-01-05 NOTE — ED Triage Notes (Signed)
 Pt arrived POV c/o right leg pain that radiates all the way to her hip and swelling x 1 month.

## 2024-01-07 NOTE — ED Provider Notes (Signed)
 [place] EMERGENCY DEPARTMENT AT [facility] Provider Note   CSN: [id number] Arrival date & time: 01/05/24  1733     Patient presents with: Leg Swelling   Breanna Malone is a 43 y.o. adult.   Patient complains of pain and swelling to her right leg.  Patient reports that she has more swelling than usual.  Patient complains of swelling in her calf.  Patient reports that she has had the pain for over a month.  Patient denies any fever she denies any chills.  The history is provided by the patient. No language interpreter was used.       Prior to Admission medications   Medication Sig Start Date End Date Taking? Authorizing Provider  albuterol  (VENTOLIN  HFA) 108 (90 Base) MCG/ACT inhaler Inhale 1-2 puffs into the lungs every 6 (six) hours as needed for wheezing or shortness of breath. 07/14/23   Daralene Lonni JONETTA, PA-C  buPROPion  (WELLBUTRIN  XL) 150 MG 24 hr tablet Take 1 tablet (150 mg total) by mouth daily. 02/25/23   Bacchus, Meade PEDLAR, FNP  cyclobenzaprine  (FLEXERIL ) 5 MG tablet Take 1 tablet (5 mg total) by mouth at bedtime as needed for muscle spasms. 02/25/23   Bacchus, Meade PEDLAR, FNP  doxycycline  (VIBRAMYCIN ) 100 MG capsule Take 1 capsule (100 mg total) by mouth 2 (two) times daily. 07/14/23   Daralene Lonni JONETTA, PA-C  gabapentin  (NEURONTIN ) 300 MG capsule Take 1 capsule (300 mg total) by mouth 3 (three) times daily as needed. Patient not taking: Reported on 06/27/2023 05/01/23   Kerrin Elspeth BROCKS, MD  methocarbamol  (ROBAXIN -750) 750 MG tablet Take 1 tablet (750 mg total) by mouth 3 (three) times daily. 06/27/23   Daralene Lonni JONETTA, PA-C  Multiple Vitamin (MULTIVITAMIN) capsule Take 1 capsule by mouth daily.    [provider]  naproxen  (NAPROSYN ) 375 MG tablet Take 1 tablet (375 mg total) by mouth 2 (two) times daily. 10/13/23   Kommor, Madison, MD  olmesartan  (BENICAR ) 20 MG tablet Take 1 tablet (20 mg total) by mouth daily. 02/25/23   Bacchus, Meade PEDLAR, FNP   rosuvastatin  (CRESTOR ) 40 MG tablet Take 1 tablet (40 mg total) by mouth daily. 02/25/23   Bacchus, Meade PEDLAR, FNP  UNABLE TO FIND RES-Q 105 max 1 daily.    [provider]    Allergies: Darvocet [propoxyphene n-acetaminophen ], Hydrocodone-acetaminophen , Lactose intolerance (gi), Other, Penicillins, Latex, and Percocet [oxycodone -acetaminophen ]    Review of Systems  All other systems reviewed and are negative.   Updated Vital Signs BP 137/89   Pulse 76   Temp 97.6 F (36.4 C)   Resp 16   Ht 5' 4 (1.626 m)   Wt 106.6 kg   LMP 12/22/2023 (Approximate)   SpO2 98%   BMI 40.34 kg/m   Physical Exam Vitals and nursing note reviewed.  Constitutional:      Appearance: She is well-developed.  HENT:     Head: Normocephalic.  Cardiovascular:     Rate and Rhythm: Normal rate.  Pulmonary:     Effort: Pulmonary effort is normal.  Abdominal:     General: There is no distension.  Musculoskeletal:        General: Normal range of motion.     Cervical back: Normal range of motion.     Comments: Tender right calf tender right knee pain with range of motion neurovascular neurosensory intact  Skin:    General: Skin is warm.  Neurological:     General: No focal deficit present.  Mental Status: She is alert and oriented to person, place, and time.     (all labs ordered are listed, but only abnormal results are displayed) Labs Reviewed  D-DIMER, QUANTITATIVE - Abnormal; Notable for the following components:      Result Value   D-Dimer, Quant 0.84 (*)    All other components within normal limits  CBC WITH DIFFERENTIAL/PLATELET - Abnormal; Notable for the following components:   RBC 3.74 (*)    Hemoglobin 11.9 (*)    HCT 35.8 (*)    All other components within normal limits  BASIC METABOLIC PANEL WITH GFR - Abnormal; Notable for the following components:   Potassium 3.4 (*)    Glucose, Bld 116 (*)    All other components within normal limits     EKG: None  Radiology: DG Knee Complete 4 Views Right Result Date: 01/05/2024 EXAM: 4 VIEW(S) XRAY OF THE RIGHT KNEE 01/05/2024 08:46:43 PM COMPARISON: None available. CLINICAL HISTORY: KNEE PAIN KNEE PAIN FINDINGS: BONES AND JOINTS: No acute fracture. No focal osseous lesion. No joint dislocation. No significant joint effusion. No significant degenerative changes. SOFT TISSUES: The soft tissues are unremarkable. IMPRESSION: 1. No significant abnormality. Electronically signed by: Morgane Naveau MD 01/05/2024 09:07 PM EST RP Workstation: [id number]     Procedures   Medications Ordered in the ED  enoxaparin  (LOVENOX ) 100 mg/mL injection 160 mg (160 mg Subcutaneous Given 01/05/24 2235)                                    Medical Decision Making Patient complains of right leg pain and swelling  Amount and/or Complexity of Data Reviewed Labs: ordered. Decision-making details documented in ED Course.    Details: Labs ordered reviewed and interpreted labs ordered reviewed and interpreted D-dimer is elevated to 0.84 hemoglobin is 11.9 Radiology: ordered.    Details: X-ray ordered reviewed and interpreted x-ray right knee shows no acute findings Discussion of management or test interpretation with external provider(s): Patient is counseled on leg swelling I will give her an injection of Lovenox .  Patient is advised to return for vascular study in the a.m.  Risk Prescription drug management.        Final diagnoses:  Right leg swelling    ED Discharge Orders          Ordered    Lower Ext Right Venous US        Comments: IMPORTANT PATIENT INSTRUCTIONS:  Your ED provider has recommended an Outpatient Ultrasound.  Please call [phone] to schedule an appointment.  If your appointment is scheduled for a Saturday, Sunday or holiday, please go to the [facility] Emergency Department Registration Desk at least 15 minutes prior to your appointment time and tell them you are there  for an ultrasound.    If your appointment is scheduled for a weekday (Monday-Friday), please go directly to the [facility] Radiology Department at least 15 minutes prior to your appointment time and tell them you are there for an ultrasound.  Please call [phone] with questions.   01/05/24 2157           An After Visit Summary was printed and given to the patient.     Flint Sonny POUR, PA-C 01/07/24 9173    Towana Ozell BROCKS, MD 01/07/24 [phone]

## 2024-01-29 ENCOUNTER — Emergency Department (HOSPITAL_COMMUNITY)
Admission: EM | Admit: 2024-01-29 | Discharge: 2024-01-29 | Disposition: A | Discharge status: Home / Self Care | Payer: MEDICAID | Attending: Emergency Medicine | Admitting: Emergency Medicine

## 2024-01-29 ENCOUNTER — Other Ambulatory Visit: Payer: Self-pay

## 2024-01-29 DIAGNOSIS — E119 Type 2 diabetes mellitus without complications: Secondary | ICD-10-CM | POA: Insufficient documentation

## 2024-01-29 DIAGNOSIS — Z91040 Latex allergy status: Secondary | ICD-10-CM | POA: Insufficient documentation

## 2024-01-29 DIAGNOSIS — M79604 Pain in right leg: Secondary | ICD-10-CM | POA: Insufficient documentation

## 2024-01-29 DIAGNOSIS — I10 Essential (primary) hypertension: Secondary | ICD-10-CM | POA: Insufficient documentation

## 2024-01-29 DIAGNOSIS — Z79899 Other long term (current) drug therapy: Secondary | ICD-10-CM | POA: Diagnosis not present

## 2024-01-29 MED ORDER — KETOROLAC TROMETHAMINE 15 MG/ML IJ SOLN
15.0000 mg | Freq: Once | INTRAMUSCULAR | Status: AC
  Administered 2024-01-29: 15 mg via INTRAMUSCULAR
  Filled 2024-01-29: qty 1

## 2024-01-29 MED ORDER — PREDNISONE 10 MG PO TABS: 40.0000 mg | ORAL_TABLET | Freq: Every day | ORAL | 0 refills | Status: DC

## 2024-01-29 NOTE — Discharge Instructions (Addendum)
 You were evaluated the emergency room for leg pain.  This is most consistent with sciatica.  Please see the attached information packet and begin the physical therapy exercises.  A prescription for prednisone  was sent into your pharmacy.  Please take as prescribed.  You may additionally alternate Tylenol  1000 mg every 4-6 hours up to 4000 mg a day with ibuprofen  600 to 800 mg every 4-6 hours up to 3200 mg a day on a full stomach.  I would recommend following up with the orthopedic doctor again if your symptoms persist despite prednisone  and physical therapy.

## 2024-01-29 NOTE — ED Triage Notes (Addendum)
 Patient here with complaints of right leg pain for the past month. She states that her leg feels like its burning and stinging.

## 2024-01-29 NOTE — ED Provider Notes (Signed)
 [place] EMERGENCY DEPARTMENT AT [facility] Provider Note   CSN: [id number] Arrival date & time: 01/29/24  2035     Patient presents with: Leg Pain   Breanna Malone is a 43 y.o. adult presents with atraumatic right lower extremity shooting burning and stinging pain that radiates down from her buttock down to her calf.  Has some ongoing back pain.  No history of IV drug use or malignancy.  Denies any urinary incontinence or saddle anesthesia.  No prior spinal surgeries.  Has been seen for right knee pain and has had fluid drained from this.  X-ray is without acute abnormality.  Has had outpatient DVT study which was negative for DVT.    Leg Pain     Past Medical History:  Diagnosis Date   Abnormal Pap smear    Arthritis    Asthma    Asthma    Bipolar 1 disorder (HCC)    Bone spur    rib   Bronchitis    BV (bacterial vaginosis) 10/08/2019   8/19 rx flagyl    Carpal tunnel syndrome    Cervical spine pain    Chest pain    Diabetes mellitus without complication (HCC)    Elevated cholesterol    High cholesterol    Hyperlipidemia    Hyperlipoproteinemia    Hypertension    Post traumatic stress disorder    Right ovarian cyst 10/31/2012   Complex right ovarian cyst seen 8/18 in ER need f/u US    Tendonitis    Vaginal Pap smear, abnormal    Past Surgical History:  Procedure Laterality Date   CARPAL TUNNEL RELEASE Right 06/28/2016   Procedure: RIGHT CARPAL TUNNEL RELEASE;  Surgeon: Margrette Taft BRAVO, MD;  Location: [facility];  Service: Orthopedics;  Laterality: Right;   HERNIA REPAIR     umbilical   RIB RESECTION Left 03/16/2021   Procedure: RESECTION OF TUMOR LEFT NINTH RIB;  Surgeon: Kerrin Elspeth BROCKS, MD;  Location: [facility] OR;  Service: Thoracic;  Laterality: Left;     Prior to Admission medications   Medication Sig Start Date End Date Taking? Authorizing Provider  predniSONE  (DELTASONE ) 10 MG tablet Take 4 tablets (40 mg total) by mouth daily. 01/29/24   Yes Donnajean Lynwood DEL, PA-C  albuterol  (VENTOLIN  HFA) 108 (90 Base) MCG/ACT inhaler Inhale 1-2 puffs into the lungs every 6 (six) hours as needed for wheezing or shortness of breath. 07/14/23   Daralene Lonni JONETTA, PA-C  buPROPion  (WELLBUTRIN  XL) 150 MG 24 hr tablet Take 1 tablet (150 mg total) by mouth daily. 02/25/23   Bacchus, Gloria Z, FNP  cyclobenzaprine  (FLEXERIL ) 5 MG tablet Take 1 tablet (5 mg total) by mouth at bedtime as needed for muscle spasms. 02/25/23   Bacchus, Meade PEDLAR, FNP  doxycycline  (VIBRAMYCIN ) 100 MG capsule Take 1 capsule (100 mg total) by mouth 2 (two) times daily. 07/14/23   Daralene Lonni JONETTA, PA-C  gabapentin  (NEURONTIN ) 300 MG capsule Take 1 capsule (300 mg total) by mouth 3 (three) times daily as needed. Patient not taking: Reported on 06/27/2023 05/01/23   Kerrin Elspeth BROCKS, MD  methocarbamol  (ROBAXIN -750) 750 MG tablet Take 1 tablet (750 mg total) by mouth 3 (three) times daily. 06/27/23   Daralene Lonni JONETTA, PA-C  Multiple Vitamin (MULTIVITAMIN) capsule Take 1 capsule by mouth daily.    [provider]  naproxen  (NAPROSYN ) 375 MG tablet Take 1 tablet (375 mg total) by mouth 2 (two) times daily. 10/13/23   Kommor, Lum, MD  olmesartan  (BENICAR ) 20 MG tablet Take 1 tablet (20 mg total) by mouth daily. 02/25/23   Bacchus, Meade PEDLAR, FNP  rosuvastatin  (CRESTOR ) 40 MG tablet Take 1 tablet (40 mg total) by mouth daily. 02/25/23   Bacchus, Meade PEDLAR, FNP  UNABLE TO FIND RES-Q 105 max 1 daily.    [provider]    Allergies: Darvocet [propoxyphene n-acetaminophen ], Hydrocodone-acetaminophen , Lactose intolerance (gi), Other, Penicillins, Latex, and Percocet [oxycodone -acetaminophen ]    Review of Systems  Musculoskeletal:  Positive for myalgias.    Updated Vital Signs BP 128/80 (BP Location: Right Arm)   Pulse 91   Temp 98.1 F (36.7 C)   Resp 19   Ht 5' 4 (1.626 m)   Wt 106 kg   LMP 12/22/2023 (Approximate)   SpO2 95%   BMI 40.11 kg/m    Physical Exam Vitals and nursing note reviewed.  Constitutional:      General: She is not in acute distress.    Appearance: She is well-developed.  HENT:     Head: Normocephalic and atraumatic.  Eyes:     Conjunctiva/sclera: Conjunctivae normal.  Cardiovascular:     Rate and Rhythm: Normal rate and regular rhythm.     Heart sounds: No murmur heard. Pulmonary:     Effort: Pulmonary effort is normal. No respiratory distress.     Breath sounds: Normal breath sounds.  Abdominal:     Palpations: Abdomen is soft.     Tenderness: There is no abdominal tenderness.  Musculoskeletal:     Cervical back: Neck supple.     Comments: Positive right straight leg raise, neurovascularly intact, 5 out of 5 lower extremity strength, no significant swelling, ecchymosis, erythema or warmth.  No midline spinal tenderness, mild paraspinal tenderness bilaterally  Skin:    General: Skin is warm and dry.     Capillary Refill: Capillary refill takes less than 2 seconds.  Neurological:     Mental Status: She is alert.  Psychiatric:        Mood and Affect: Mood normal.     (all labs ordered are listed, but only abnormal results are displayed) Labs Reviewed - No data to display  EKG: None  Radiology: No results found.   Procedures   Medications Ordered in the ED  ketorolac  (TORADOL ) 15 MG/ML injection 15 mg (has no administration in time range)    Clinical Course as of 01/29/24 2150  Wed Jan 29, 2024  2145 Patient evaluated for right lower extremity burning pain over the past couple weeks.  Is atraumatic.  Has had some generalized low back pain as well without any red flag symptoms.  Patient is hemodynamically stable and afebrile.  She has been seen for this in the past and has had an outpatient negative DVT study as well.  Do not feel that any repeat labs or imaging is indicated today.  Patient was seen today by orthopedics outpatient was felt to be consistent with sciatica as well.  Was  referred to physical therapy.  Will send a prescription for prednisone  taper.  She has had no significant relief with muscle relaxers.  Encouraged to follow-up again with orthopedics.  Patient is understanding agreement plan.  She is requesting crutches as well although she appears ambulatory in triage. [JT]    Clinical Course User Index [JT] Donnajean Lynwood DEL, PA-C  Medical Decision Making  This patient presents to the ED with chief complaint(s) of Leg pain .  The complaint involves an extensive differential diagnosis and also carries with it a high risk of complications and morbidity.   Pertinent past medical history as listed in HPI  The differential diagnosis includes  Based off exam and history do not suspect cauda equina, spinal abscess, discitis, fracture, dislocation or DVT Additional history obtained: Records reviewed Care Everywhere/External Records  Disposition:   Patient will be discharged home. The patient has been appropriately medically screened and/or stabilized in the ED. I have low suspicion for any other emergent medical condition which would require further screening, evaluation or treatment in the ED or require inpatient management. At time of discharge the patient is hemodynamically stable and in no acute distress. I have discussed work-up results and diagnosis with patient and answered all questions. Patient is agreeable with discharge plan. We discussed strict return precautions for returning to the emergency department and they verbalized understanding.     Social Determinants of Health:   none  This note was dictated with voice recognition software.  Despite best efforts at proofreading, errors may have occurred which can change the documentation meaning. ]     Final diagnoses:  Right leg pain    ED Discharge Orders          Ordered    predniSONE  (DELTASONE ) 10 MG tablet  Daily        01/29/24 2148                Donnajean Lynwood DEL, PA-C 01/29/24 2150    Kingsley, Victoria K, DO 01/29/24 2308

## 2024-02-22 NOTE — ED Triage Notes (Signed)
 Lower back pain radiating to buttocks and down right leg x 2 months; was seen at [facility] for same about a month ago.

## 2024-02-26 ENCOUNTER — Encounter (HOSPITAL_COMMUNITY): Payer: Self-pay

## 2024-02-26 ENCOUNTER — Emergency Department (HOSPITAL_COMMUNITY)
Admission: EM | Admit: 2024-02-26 | Discharge: 2024-02-27 | Disposition: A | Discharge status: Home / Self Care | Payer: MEDICAID | Attending: Emergency Medicine | Admitting: Emergency Medicine

## 2024-02-26 ENCOUNTER — Emergency Department (HOSPITAL_COMMUNITY): Payer: MEDICAID

## 2024-02-26 ENCOUNTER — Emergency Department (HOSPITAL_COMMUNITY)
Admission: EM | Admit: 2024-02-26 | Discharge: 2024-02-26 | Discharge status: Against Medical Advice | Payer: MEDICAID | Attending: Emergency Medicine | Admitting: Emergency Medicine

## 2024-02-26 ENCOUNTER — Other Ambulatory Visit: Payer: Self-pay

## 2024-02-26 DIAGNOSIS — Z5329 Procedure and treatment not carried out because of patient's decision for other reasons: Secondary | ICD-10-CM | POA: Insufficient documentation

## 2024-02-26 DIAGNOSIS — M5441 Lumbago with sciatica, right side: Secondary | ICD-10-CM | POA: Diagnosis not present

## 2024-02-26 DIAGNOSIS — L905 Scar conditions and fibrosis of skin: Secondary | ICD-10-CM | POA: Diagnosis not present

## 2024-02-26 DIAGNOSIS — G8929 Other chronic pain: Secondary | ICD-10-CM | POA: Diagnosis not present

## 2024-02-26 DIAGNOSIS — M545 Low back pain, unspecified: Secondary | ICD-10-CM | POA: Diagnosis present

## 2024-02-26 DIAGNOSIS — M25532 Pain in left wrist: Secondary | ICD-10-CM | POA: Insufficient documentation

## 2024-02-26 DIAGNOSIS — M549 Dorsalgia, unspecified: Secondary | ICD-10-CM | POA: Diagnosis present

## 2024-02-26 DIAGNOSIS — M25531 Pain in right wrist: Secondary | ICD-10-CM | POA: Insufficient documentation

## 2024-02-26 DIAGNOSIS — Z91040 Latex allergy status: Secondary | ICD-10-CM | POA: Diagnosis not present

## 2024-02-26 NOTE — Progress Notes (Signed)
 Attempted to bring patient from waiting room. Per transporter no one answered.

## 2024-02-26 NOTE — ED Notes (Signed)
 Ambulatory to treatment room without assist

## 2024-02-26 NOTE — ED Triage Notes (Signed)
 Pt reports she has sciatica pain for about 3 months and has trouble walking up and down steps from it.  Pt reports she has intermittent hand swelling that was present on her way here but went away before she got  here.

## 2024-02-26 NOTE — ED Triage Notes (Signed)
 Patient states that she feels that she has burning down the right leg. She states that her back is continuing to hurt. Denies injury. Was diagnosed with sciatica pain in December. She also states that she is having bilateral arm pain and she feels like its shooting nerve pain with swelling.  Denies hx of blood clots.

## 2024-02-26 NOTE — ED Provider Triage Note (Signed)
 Emergency Medicine Provider Triage Evaluation Note  Breanna Malone , a 44 y.o. adult  was evaluated in triage.  Pt complains of back pain with radiation down right leg, upper extremity pain and swelling.  Review of Systems  Positive: Back pain with radiation down right leg, upper extremity pain and swelling Negative: Saddle anesthesia, loss of bowel or bladder control, trauma  Physical Exam  BP (!) 145/80 (BP Location: Right Arm)   Pulse 86   Temp 98.5 F (36.9 C)   Resp 18   Ht 5' 4 (1.626 m)   Wt 104.3 kg   SpO2 95%   BMI 39.48 kg/m  Gen:   Awake, no distress   Resp:  Normal effort  MSK:   Moves extremities without difficulty, swelling to right upper extremity when compared to left. Other:    Medical Decision Making  Medically screening exam initiated at 2:30 PM.  Appropriate orders placed.  Breanna Malone was informed that the remainder of the evaluation will be completed by another provider, this initial triage assessment does not replace that evaluation, and the importance of remaining in the ED until their evaluation is complete.  Imaging ordered   Francis Ileana SAILOR, PA-C 02/26/24 1432

## 2024-02-26 NOTE — ED Notes (Signed)
 Pt called for vascular and did not respond. Pt marked eloped.

## 2024-02-27 MED ORDER — PREDNISONE 50 MG PO TABS
60.0000 mg | ORAL_TABLET | Freq: Once | ORAL | Status: AC
  Administered 2024-02-27: 60 mg via ORAL
  Filled 2024-02-27: qty 1

## 2024-02-27 MED ORDER — PREDNISONE 10 MG (21) PO TBPK: ORAL_TABLET | ORAL | 0 refills | Status: AC

## 2024-02-27 MED ORDER — NAPROXEN 250 MG PO TABS
500.0000 mg | ORAL_TABLET | Freq: Once | ORAL | Status: DC
  Filled 2024-02-27: qty 2

## 2024-02-27 MED ORDER — NAPROXEN 500 MG PO TABS: 500.0000 mg | ORAL_TABLET | Freq: Two times a day (BID) | ORAL | 0 refills | Status: AC

## 2024-02-27 MED ORDER — METHOCARBAMOL 500 MG PO TABS
500.0000 mg | ORAL_TABLET | Freq: Once | ORAL | Status: DC
  Filled 2024-02-27: qty 1

## 2024-02-27 NOTE — ED Provider Notes (Signed)
 "  [place] EMERGENCY DEPARTMENT AT [facility]  Provider Note  CSN: [id number] Arrival date & time: 02/26/24 2241  History Chief Complaint  Patient presents with   Back Pain    Breanna Malone is a 44 y.o. female with history of chronic knee pain has had R lower back/sciatica off and on for the last several months and then today reports pain and swelling in both wrists. She went to [facility] initially, but left due to wait times. Wrist pain/swelling has gone down, but still having R sided low back pain. Also concerned about tenderness at location of prior benign mass removal from L flank.    Home Medications Prior to Admission medications  Medication Sig Start Date End Date Taking? Authorizing Provider  naproxen  (NAPROSYN ) 500 MG tablet Take 1 tablet (500 mg total) by mouth 2 (two) times daily. 02/27/24  Yes Roselyn Carlin NOVAK, MD  predniSONE  (STERAPRED UNI-PAK 21 TAB) 10 MG (21) TBPK tablet 10mg  Tabs, 6 day taper. Use as directed 02/27/24  Yes Roselyn Carlin NOVAK, MD  albuterol  (VENTOLIN  HFA) 108 [phone] Base) MCG/ACT inhaler Inhale 1-2 puffs into the lungs every 6 (six) hours as needed for wheezing or shortness of breath. 07/14/23   Daralene Lonni JONETTA, PA-C  buPROPion  (WELLBUTRIN  XL) 150 MG 24 hr tablet Take 1 tablet (150 mg total) by mouth daily. 02/25/23   Bacchus, Gloria Z, FNP  Multiple Vitamin (MULTIVITAMIN) capsule Take 1 capsule by mouth daily.    [provider]  olmesartan  (BENICAR ) 20 MG tablet Take 1 tablet (20 mg total) by mouth daily. 02/25/23   Bacchus, Meade PEDLAR, FNP  rosuvastatin  (CRESTOR ) 40 MG tablet Take 1 tablet (40 mg total) by mouth daily. 02/25/23   Bacchus, Meade PEDLAR, FNP  UNABLE TO FIND RES-Q 105 max 1 daily.    [provider]     Allergies    Darvocet [propoxyphene n-acetaminophen ], Hydrocodone-acetaminophen , Lactose intolerance (gi), Other, Penicillins, Latex, and Percocet [oxycodone -acetaminophen ]   Review of Systems   Review of Systems Please  see HPI for pertinent positives and negatives  Physical Exam BP (!) 166/96   Pulse 80   Temp 99.1 F (37.3 C) (Temporal)   Resp 20   Wt 104.3 kg   SpO2 94%   BMI 39.48 kg/m   Physical Exam Vitals and nursing note reviewed.  Constitutional:      Appearance: Normal appearance.  HENT:     Head: Normocephalic and atraumatic.     Nose: Nose normal.     Mouth/Throat:     Mouth: Mucous membranes are moist.  Eyes:     Extraocular Movements: Extraocular movements intact.     Conjunctiva/sclera: Conjunctivae normal.  Cardiovascular:     Rate and Rhythm: Normal rate.  Pulmonary:     Effort: Pulmonary effort is normal.     Breath sounds: Normal breath sounds.  Abdominal:     General: Abdomen is flat.     Palpations: Abdomen is soft.     Tenderness: There is no abdominal tenderness.  Musculoskeletal:        General: Tenderness (R lumbar paraspinal muscles) present. No swelling (to wrists) or deformity. Normal range of motion.     Cervical back: Neck supple.  Skin:    General: Skin is warm and dry.     Comments: Mild tenderness and some scarring in L flank at prior surgical site, but no discrete mass or fluctuance, no signs of infection  Neurological:     General: No  focal deficit present.     Mental Status: She is alert.  Psychiatric:        Mood and Affect: Mood normal.     ED Results / Procedures / Treatments   EKG None  Procedures Procedures  Medications Ordered in the ED Medications  naproxen  (NAPROSYN ) tablet 500 mg (has no administration in time range)  predniSONE  (DELTASONE ) tablet 60 mg (has no administration in time range)    Initial Impression and Plan  Patient here with multiple complaints of joint and back pain. Exam here is reassuring with no red flags. Recommend she follow up with PCP for a rheumatologic workup but for now will give Rx for Naprosyn , Prednisone  and recommend ice, and rest.   ED Course       MDM Rules/Calculators/A&P Medical  Decision Making Problems Addressed: Chronic right-sided low back pain with right-sided sciatica: chronic illness or injury with exacerbation, progression, or side effects of treatment Pain in both wrists: acute illness or injury Painful scar: chronic illness or injury  Risk Prescription drug management.     Final Clinical Impression(s) / ED Diagnoses Final diagnoses:  Chronic right-sided low back pain with right-sided sciatica  Pain in both wrists  Painful scar    Rx / DC Orders ED Discharge Orders          Ordered    naproxen  (NAPROSYN ) 500 MG tablet  2 times daily        02/27/24 0020    predniSONE  (STERAPRED UNI-PAK 21 TAB) 10 MG (21) TBPK tablet        02/27/24 0020             Roselyn Carlin NOVAK, MD 02/27/24 0020  "

## 2024-03-04 ENCOUNTER — Emergency Department (HOSPITAL_COMMUNITY)
Admission: EM | Admit: 2024-03-04 | Discharge: 2024-03-04 | Discharge status: Against Medical Advice | Payer: MEDICAID | Attending: Emergency Medicine | Admitting: Emergency Medicine

## 2024-03-04 DIAGNOSIS — M549 Dorsalgia, unspecified: Secondary | ICD-10-CM | POA: Insufficient documentation

## 2024-03-04 DIAGNOSIS — Z5321 Procedure and treatment not carried out due to patient leaving prior to being seen by health care provider: Secondary | ICD-10-CM | POA: Insufficient documentation

## 2024-03-04 NOTE — ED Triage Notes (Signed)
 Pt reports hx of sciatica. Back pain has gotten worse over past month. Denies going to a provider to receive any follow up/treatment for sciatica. Pt reports 39 steps to get up to her apartment. Pt ambulating independently to triage with steady gait. NAD noted.

## 2024-03-04 NOTE — ED Notes (Signed)
 Pt states I'm just gonna leave and see my doctor tomorrow
